# Patient Record
Sex: Male | Born: 1965 | Race: White | Hispanic: No | Marital: Single | Smoking: Former smoker
Health system: Southern US, Community
[De-identification: ages and names within clinical notes are randomized; demographics above are authoritative.]

## PROBLEM LIST (undated history)

## (undated) DIAGNOSIS — I509 Heart failure, unspecified: Secondary | ICD-10-CM

## (undated) DIAGNOSIS — I739 Peripheral vascular disease, unspecified: Secondary | ICD-10-CM

## (undated) DIAGNOSIS — Z89619 Acquired absence of unspecified leg above knee: Secondary | ICD-10-CM

## (undated) DIAGNOSIS — N184 Chronic kidney disease, stage 4 (severe): Secondary | ICD-10-CM

## (undated) DIAGNOSIS — I429 Cardiomyopathy, unspecified: Secondary | ICD-10-CM

## (undated) DIAGNOSIS — I2510 Atherosclerotic heart disease of native coronary artery without angina pectoris: Secondary | ICD-10-CM

## (undated) DIAGNOSIS — K219 Gastro-esophageal reflux disease without esophagitis: Secondary | ICD-10-CM

## (undated) DIAGNOSIS — Z951 Presence of aortocoronary bypass graft: Secondary | ICD-10-CM

## (undated) DIAGNOSIS — I10 Essential (primary) hypertension: Secondary | ICD-10-CM

## (undated) HISTORY — DX: Cardiomyopathy, unspecified: I42.9

## (undated) HISTORY — DX: Chronic kidney disease, stage 4 (severe): N18.4

## (undated) HISTORY — DX: Peripheral vascular disease, unspecified: I73.9

## (undated) HISTORY — DX: Presence of aortocoronary bypass graft: Z95.1

---

## 2005-11-14 ENCOUNTER — Emergency Department (HOSPITAL_COMMUNITY): Admission: EM | Admit: 2005-11-14 | Discharge: 2005-11-14 | Payer: Self-pay | Admitting: Emergency Medicine

## 2005-11-16 ENCOUNTER — Encounter: Payer: Self-pay | Admitting: Emergency Medicine

## 2005-11-16 ENCOUNTER — Inpatient Hospital Stay (HOSPITAL_COMMUNITY): Admission: EM | Admit: 2005-11-16 | Discharge: 2005-11-22 | Payer: Self-pay | Admitting: Internal Medicine

## 2005-11-19 ENCOUNTER — Ambulatory Visit: Payer: Self-pay | Admitting: Infectious Diseases

## 2008-05-04 ENCOUNTER — Inpatient Hospital Stay (HOSPITAL_COMMUNITY): Admission: EM | Admit: 2008-05-04 | Discharge: 2008-05-09 | Payer: Self-pay | Admitting: Emergency Medicine

## 2008-05-04 ENCOUNTER — Ambulatory Visit: Payer: Self-pay | Admitting: Internal Medicine

## 2008-05-05 ENCOUNTER — Encounter: Payer: Self-pay | Admitting: Internal Medicine

## 2008-05-07 HISTORY — PX: CARDIAC CATHETERIZATION: SHX172

## 2008-05-08 ENCOUNTER — Ambulatory Visit: Payer: Self-pay | Admitting: Thoracic Surgery (Cardiothoracic Vascular Surgery)

## 2008-05-08 ENCOUNTER — Encounter: Payer: Self-pay | Admitting: Internal Medicine

## 2008-05-09 ENCOUNTER — Ambulatory Visit: Payer: Self-pay | Admitting: Vascular Surgery

## 2008-05-09 ENCOUNTER — Encounter (INDEPENDENT_AMBULATORY_CARE_PROVIDER_SITE_OTHER): Payer: Self-pay | Admitting: Internal Medicine

## 2008-05-11 ENCOUNTER — Encounter (INDEPENDENT_AMBULATORY_CARE_PROVIDER_SITE_OTHER): Payer: Self-pay | Admitting: Internal Medicine

## 2008-05-12 ENCOUNTER — Encounter: Payer: Self-pay | Admitting: *Deleted

## 2008-05-12 LAB — CONVERTED CEMR LAB: LDL Cholesterol: 153 mg/dL

## 2008-05-19 ENCOUNTER — Ambulatory Visit: Payer: Self-pay | Admitting: Thoracic Surgery (Cardiothoracic Vascular Surgery)

## 2008-06-03 ENCOUNTER — Encounter (INDEPENDENT_AMBULATORY_CARE_PROVIDER_SITE_OTHER): Payer: Self-pay | Admitting: *Deleted

## 2008-06-16 ENCOUNTER — Ambulatory Visit: Payer: Self-pay | Admitting: Thoracic Surgery (Cardiothoracic Vascular Surgery)

## 2008-06-23 HISTORY — PX: CORONARY ARTERY BYPASS GRAFT: SHX141

## 2008-07-07 ENCOUNTER — Ambulatory Visit: Payer: Self-pay | Admitting: Thoracic Surgery (Cardiothoracic Vascular Surgery)

## 2008-07-17 ENCOUNTER — Ambulatory Visit: Payer: Self-pay | Admitting: Thoracic Surgery (Cardiothoracic Vascular Surgery)

## 2008-07-17 ENCOUNTER — Inpatient Hospital Stay (HOSPITAL_COMMUNITY)
Admission: RE | Admit: 2008-07-17 | Discharge: 2008-07-21 | Payer: Self-pay | Admitting: Thoracic Surgery (Cardiothoracic Vascular Surgery)

## 2008-07-24 ENCOUNTER — Ambulatory Visit: Payer: Self-pay | Admitting: Thoracic Surgery (Cardiothoracic Vascular Surgery)

## 2008-08-04 ENCOUNTER — Ambulatory Visit: Payer: Self-pay | Admitting: Thoracic Surgery (Cardiothoracic Vascular Surgery)

## 2008-08-11 ENCOUNTER — Ambulatory Visit: Payer: Self-pay | Admitting: Thoracic Surgery (Cardiothoracic Vascular Surgery)

## 2008-08-11 ENCOUNTER — Encounter
Admission: RE | Admit: 2008-08-11 | Discharge: 2008-08-11 | Payer: Self-pay | Admitting: Thoracic Surgery (Cardiothoracic Vascular Surgery)

## 2008-09-08 ENCOUNTER — Ambulatory Visit: Payer: Self-pay | Admitting: Thoracic Surgery (Cardiothoracic Vascular Surgery)

## 2010-08-05 LAB — GLUCOSE, CAPILLARY
Glucose-Capillary: 100 mg/dL — ABNORMAL HIGH (ref 70–99)
Glucose-Capillary: 102 mg/dL — ABNORMAL HIGH (ref 70–99)
Glucose-Capillary: 106 mg/dL — ABNORMAL HIGH (ref 70–99)
Glucose-Capillary: 112 mg/dL — ABNORMAL HIGH (ref 70–99)
Glucose-Capillary: 122 mg/dL — ABNORMAL HIGH (ref 70–99)
Glucose-Capillary: 129 mg/dL — ABNORMAL HIGH (ref 70–99)
Glucose-Capillary: 130 mg/dL — ABNORMAL HIGH (ref 70–99)
Glucose-Capillary: 142 mg/dL — ABNORMAL HIGH (ref 70–99)
Glucose-Capillary: 161 mg/dL — ABNORMAL HIGH (ref 70–99)
Glucose-Capillary: 167 mg/dL — ABNORMAL HIGH (ref 70–99)
Glucose-Capillary: 177 mg/dL — ABNORMAL HIGH (ref 70–99)
Glucose-Capillary: 92 mg/dL (ref 70–99)

## 2010-08-05 LAB — CBC
HCT: 23.4 % — ABNORMAL LOW (ref 39.0–52.0)
HCT: 24.8 % — ABNORMAL LOW (ref 39.0–52.0)
HCT: 26.1 % — ABNORMAL LOW (ref 39.0–52.0)
HCT: 29.1 % — ABNORMAL LOW (ref 39.0–52.0)
HCT: 35.9 % — ABNORMAL LOW (ref 39.0–52.0)
Hemoglobin: 12.4 g/dL — ABNORMAL LOW (ref 13.0–17.0)
Hemoglobin: 8.3 g/dL — ABNORMAL LOW (ref 13.0–17.0)
Hemoglobin: 9.1 g/dL — ABNORMAL LOW (ref 13.0–17.0)
Hemoglobin: 9.4 g/dL — ABNORMAL LOW (ref 13.0–17.0)
MCHC: 34.7 g/dL (ref 30.0–36.0)
MCHC: 34.9 g/dL (ref 30.0–36.0)
MCHC: 35.2 g/dL (ref 30.0–36.0)
MCHC: 35.7 g/dL (ref 30.0–36.0)
MCV: 87.1 fL (ref 78.0–100.0)
MCV: 87.3 fL (ref 78.0–100.0)
MCV: 87.6 fL (ref 78.0–100.0)
MCV: 87.6 fL (ref 78.0–100.0)
MCV: 88.5 fL (ref 78.0–100.0)
Platelets: 103 10*3/uL — ABNORMAL LOW (ref 150–400)
Platelets: 117 10*3/uL — ABNORMAL LOW (ref 150–400)
Platelets: 87 10*3/uL — ABNORMAL LOW (ref 150–400)
Platelets: 89 10*3/uL — ABNORMAL LOW (ref 150–400)
Platelets: 97 10*3/uL — ABNORMAL LOW (ref 150–400)
RBC: 2.71 MIL/uL — ABNORMAL LOW (ref 4.22–5.81)
RBC: 3.07 MIL/uL — ABNORMAL LOW (ref 4.22–5.81)
RBC: 3.34 MIL/uL — ABNORMAL LOW (ref 4.22–5.81)
RBC: 4.1 MIL/uL — ABNORMAL LOW (ref 4.22–5.81)
RDW: 13.8 % (ref 11.5–15.5)
RDW: 13.8 % (ref 11.5–15.5)
RDW: 13.8 % (ref 11.5–15.5)
RDW: 13.9 % (ref 11.5–15.5)
RDW: 14 % (ref 11.5–15.5)
WBC: 6.1 10*3/uL (ref 4.0–10.5)
WBC: 9.3 10*3/uL (ref 4.0–10.5)
WBC: 9.8 10*3/uL (ref 4.0–10.5)

## 2010-08-05 LAB — MAGNESIUM
Magnesium: 2.7 mg/dL — ABNORMAL HIGH (ref 1.5–2.5)
Magnesium: 2.7 mg/dL — ABNORMAL HIGH (ref 1.5–2.5)
Magnesium: 2.8 mg/dL — ABNORMAL HIGH (ref 1.5–2.5)

## 2010-08-05 LAB — POCT I-STAT, CHEM 8
BUN: 33 mg/dL — ABNORMAL HIGH (ref 6–23)
Chloride: 101 mEq/L (ref 96–112)
Chloride: 105 mEq/L (ref 96–112)
Glucose, Bld: 137 mg/dL — ABNORMAL HIGH (ref 70–99)
HCT: 27 % — ABNORMAL LOW (ref 39.0–52.0)
HCT: 27 % — ABNORMAL LOW (ref 39.0–52.0)
Hemoglobin: 9.2 g/dL — ABNORMAL LOW (ref 13.0–17.0)
Potassium: 4.3 mEq/L (ref 3.5–5.1)
Potassium: 4.4 mEq/L (ref 3.5–5.1)
Sodium: 139 mEq/L (ref 135–145)

## 2010-08-05 LAB — BASIC METABOLIC PANEL
BUN: 24 mg/dL — ABNORMAL HIGH (ref 6–23)
BUN: 42 mg/dL — ABNORMAL HIGH (ref 6–23)
CO2: 27 mEq/L (ref 19–32)
Calcium: 8.8 mg/dL (ref 8.4–10.5)
Calcium: 8.8 mg/dL (ref 8.4–10.5)
Calcium: 8.8 mg/dL (ref 8.4–10.5)
Creatinine, Ser: 1.89 mg/dL — ABNORMAL HIGH (ref 0.4–1.5)
Creatinine, Ser: 2.17 mg/dL — ABNORMAL HIGH (ref 0.4–1.5)
GFR calc Af Amer: 37 mL/min — ABNORMAL LOW (ref >60)
GFR calc non Af Amer: 30 mL/min — ABNORMAL LOW (ref >60)
GFR calc non Af Amer: 35 mL/min — ABNORMAL LOW (ref >60)
GFR calc non Af Amer: 39 mL/min — ABNORMAL LOW (ref >60)
Glucose, Bld: 135 mg/dL — ABNORMAL HIGH (ref 70–99)
Glucose, Bld: 144 mg/dL — ABNORMAL HIGH (ref 70–99)
Glucose, Bld: 152 mg/dL — ABNORMAL HIGH (ref 70–99)
Potassium: 4.2 mEq/L (ref 3.5–5.1)
Sodium: 135 mEq/L (ref 135–145)

## 2010-08-05 LAB — TYPE AND SCREEN
ABO/RH(D): O POS
Antibody Screen: NEGATIVE

## 2010-08-05 LAB — POCT I-STAT 4, (NA,K, GLUC, HGB,HCT)
Glucose, Bld: 137 mg/dL — ABNORMAL HIGH (ref 70–99)
Glucose, Bld: 160 mg/dL — ABNORMAL HIGH (ref 70–99)
Glucose, Bld: 169 mg/dL — ABNORMAL HIGH (ref 70–99)
HCT: 20 % — ABNORMAL LOW (ref 39.0–52.0)
HCT: 31 % — ABNORMAL LOW (ref 39.0–52.0)
Hemoglobin: 10.5 g/dL — ABNORMAL LOW (ref 13.0–17.0)
Hemoglobin: 10.5 g/dL — ABNORMAL LOW (ref 13.0–17.0)
Hemoglobin: 7.8 g/dL — CL (ref 13.0–17.0)
Hemoglobin: 8.5 g/dL — ABNORMAL LOW (ref 13.0–17.0)
Potassium: 3.6 mEq/L (ref 3.5–5.1)
Potassium: 4.2 mEq/L (ref 3.5–5.1)
Potassium: 4.5 mEq/L (ref 3.5–5.1)
Sodium: 137 mEq/L (ref 135–145)
Sodium: 138 mEq/L (ref 135–145)

## 2010-08-05 LAB — BLOOD GAS, ARTERIAL
Acid-Base Excess: 1.3 mmol/L (ref 0.0–2.0)
Bicarbonate: 25.3 mEq/L — ABNORMAL HIGH (ref 20.0–24.0)
Drawn by: 206361
FIO2: 0.21 %
O2 Saturation: 95.6 %
pH, Arterial: 7.417 (ref 7.350–7.450)

## 2010-08-05 LAB — PREPARE PLATELETS

## 2010-08-05 LAB — COMPREHENSIVE METABOLIC PANEL
AST: 30 U/L (ref 0–37)
BUN: 31 mg/dL — ABNORMAL HIGH (ref 6–23)
Chloride: 106 mEq/L (ref 96–112)
Creatinine, Ser: 1.63 mg/dL — ABNORMAL HIGH (ref 0.4–1.5)
GFR calc Af Amer: 56 mL/min — ABNORMAL LOW (ref >60)
GFR calc non Af Amer: 46 mL/min — ABNORMAL LOW (ref >60)
Glucose, Bld: 206 mg/dL — ABNORMAL HIGH (ref 70–99)
Potassium: 4.1 mEq/L (ref 3.5–5.1)
Sodium: 142 mEq/L (ref 135–145)
Total Bilirubin: 0.7 mg/dL (ref 0.3–1.2)
Total Protein: 6.4 g/dL (ref 6.0–8.3)

## 2010-08-05 LAB — CREATININE, SERUM
GFR calc Af Amer: 56 mL/min — ABNORMAL LOW (ref >60)
GFR calc non Af Amer: 32 mL/min — ABNORMAL LOW (ref >60)

## 2010-08-05 LAB — POCT I-STAT 3, ART BLOOD GAS (G3+)
Acid-Base Excess: 1 mmol/L (ref 0.0–2.0)
Bicarbonate: 23.6 mEq/L (ref 20.0–24.0)
Bicarbonate: 25.6 mEq/L — ABNORMAL HIGH (ref 20.0–24.0)
Bicarbonate: 25.7 mEq/L — ABNORMAL HIGH (ref 20.0–24.0)
O2 Saturation: 99 %
Patient temperature: 37.7
TCO2: 25 mmol/L (ref 0–100)
TCO2: 27 mmol/L (ref 0–100)
TCO2: 27 mmol/L (ref 0–100)
pCO2 arterial: 39.5 mmHg (ref 35.0–45.0)
pH, Arterial: 7.409 (ref 7.350–7.450)
pO2, Arterial: 83 mmHg (ref 80.0–100.0)

## 2010-08-05 LAB — PREPARE FRESH FROZEN PLASMA

## 2010-08-05 LAB — URINALYSIS, ROUTINE W REFLEX MICROSCOPIC
Ketones, ur: NEGATIVE mg/dL
Nitrite: NEGATIVE
Protein, ur: NEGATIVE mg/dL

## 2010-08-05 LAB — HEMOGLOBIN A1C: Hgb A1c MFr Bld: 7.3 % — ABNORMAL HIGH (ref 4.6–6.1)

## 2010-08-05 LAB — PROTIME-INR
INR: 0.9 (ref 0.00–1.49)
INR: 1.3 (ref 0.00–1.49)
Prothrombin Time: 16.3 seconds — ABNORMAL HIGH (ref 11.6–15.2)

## 2010-08-05 LAB — HEMOGLOBIN AND HEMATOCRIT, BLOOD: Hemoglobin: 8.1 g/dL — ABNORMAL LOW (ref 13.0–17.0)

## 2010-08-05 LAB — ABO/RH: ABO/RH(D): O POS

## 2010-08-09 LAB — GLUCOSE, CAPILLARY
Glucose-Capillary: 123 mg/dL — ABNORMAL HIGH (ref 70–99)
Glucose-Capillary: 139 mg/dL — ABNORMAL HIGH (ref 70–99)
Glucose-Capillary: 157 mg/dL — ABNORMAL HIGH (ref 70–99)
Glucose-Capillary: 160 mg/dL — ABNORMAL HIGH (ref 70–99)
Glucose-Capillary: 161 mg/dL — ABNORMAL HIGH (ref 70–99)
Glucose-Capillary: 166 mg/dL — ABNORMAL HIGH (ref 70–99)
Glucose-Capillary: 168 mg/dL — ABNORMAL HIGH (ref 70–99)
Glucose-Capillary: 185 mg/dL — ABNORMAL HIGH (ref 70–99)
Glucose-Capillary: 189 mg/dL — ABNORMAL HIGH (ref 70–99)
Glucose-Capillary: 203 mg/dL — ABNORMAL HIGH (ref 70–99)
Glucose-Capillary: 276 mg/dL — ABNORMAL HIGH (ref 70–99)

## 2010-08-09 LAB — RAPID URINE DRUG SCREEN, HOSP PERFORMED
Barbiturates: NOT DETECTED
Cocaine: NOT DETECTED
Opiates: NOT DETECTED
Tetrahydrocannabinol: NOT DETECTED

## 2010-08-09 LAB — POCT CARDIAC MARKERS: Myoglobin, poc: 99.6 ng/mL (ref 12–200)

## 2010-08-09 LAB — BASIC METABOLIC PANEL
BUN: 23 mg/dL (ref 6–23)
CO2: 28 mEq/L (ref 19–32)
CO2: 32 mEq/L (ref 19–32)
Calcium: 8.3 mg/dL — ABNORMAL LOW (ref 8.4–10.5)
Calcium: 8.8 mg/dL (ref 8.4–10.5)
Chloride: 101 mEq/L (ref 96–112)
Creatinine, Ser: 1.56 mg/dL — ABNORMAL HIGH (ref 0.4–1.5)
GFR calc Af Amer: 53 mL/min — ABNORMAL LOW (ref >60)
GFR calc Af Amer: 56 mL/min — ABNORMAL LOW (ref >60)
GFR calc Af Amer: 59 mL/min — ABNORMAL LOW (ref >60)
GFR calc non Af Amer: 42 mL/min — ABNORMAL LOW (ref >60)
GFR calc non Af Amer: 44 mL/min — ABNORMAL LOW (ref >60)
GFR calc non Af Amer: 46 mL/min — ABNORMAL LOW (ref >60)
Glucose, Bld: 176 mg/dL — ABNORMAL HIGH (ref 70–99)
Potassium: 3.6 mEq/L (ref 3.5–5.1)
Potassium: 3.8 mEq/L (ref 3.5–5.1)
Potassium: 3.9 mEq/L (ref 3.5–5.1)
Potassium: 3.9 mEq/L (ref 3.5–5.1)
Sodium: 136 mEq/L (ref 135–145)
Sodium: 139 mEq/L (ref 135–145)

## 2010-08-09 LAB — CBC
HCT: 36.1 % — ABNORMAL LOW (ref 39.0–52.0)
HCT: 37.5 % — ABNORMAL LOW (ref 39.0–52.0)
HCT: 37.7 % — ABNORMAL LOW (ref 39.0–52.0)
HCT: 38.6 % — ABNORMAL LOW (ref 39.0–52.0)
HCT: 40 % (ref 39.0–52.0)
HCT: 41.7 % (ref 39.0–52.0)
Hemoglobin: 12.1 g/dL — ABNORMAL LOW (ref 13.0–17.0)
Hemoglobin: 12.5 g/dL — ABNORMAL LOW (ref 13.0–17.0)
Hemoglobin: 12.7 g/dL — ABNORMAL LOW (ref 13.0–17.0)
Hemoglobin: 12.7 g/dL — ABNORMAL LOW (ref 13.0–17.0)
Hemoglobin: 13 g/dL (ref 13.0–17.0)
Hemoglobin: 13.7 g/dL (ref 13.0–17.0)
MCHC: 32.6 g/dL (ref 30.0–36.0)
MCHC: 33.7 g/dL (ref 30.0–36.0)
MCV: 89.6 fL (ref 78.0–100.0)
MCV: 91.7 fL (ref 78.0–100.0)
Platelets: 91 10*3/uL — ABNORMAL LOW (ref 150–400)
RBC: 4.03 MIL/uL — ABNORMAL LOW (ref 4.22–5.81)
RBC: 4.17 MIL/uL — ABNORMAL LOW (ref 4.22–5.81)
RBC: 4.21 MIL/uL — ABNORMAL LOW (ref 4.22–5.81)
RBC: 4.6 MIL/uL (ref 4.22–5.81)
RDW: 13.4 % (ref 11.5–15.5)
RDW: 13.6 % (ref 11.5–15.5)
RDW: 13.6 % (ref 11.5–15.5)
WBC: 5 10*3/uL (ref 4.0–10.5)
WBC: 6 10*3/uL (ref 4.0–10.5)
WBC: 6.5 10*3/uL (ref 4.0–10.5)
WBC: 8.1 10*3/uL (ref 4.0–10.5)

## 2010-08-09 LAB — POCT I-STAT 3, VENOUS BLOOD GAS (G3P V)
Bicarbonate: 23.9 mEq/L (ref 20.0–24.0)
O2 Saturation: 97 %
TCO2: 24 mmol/L (ref 0–100)
TCO2: 25 mmol/L (ref 0–100)
pCO2, Ven: 34.6 mmHg — ABNORMAL LOW (ref 45.0–50.0)
pCO2, Ven: 39.8 mmHg — ABNORMAL LOW (ref 45.0–50.0)
pH, Ven: 7.387 — ABNORMAL HIGH (ref 7.250–7.300)
pH, Ven: 7.429 — ABNORMAL HIGH (ref 7.250–7.300)

## 2010-08-09 LAB — COMPREHENSIVE METABOLIC PANEL
ALT: 21 U/L (ref 0–53)
Alkaline Phosphatase: 102 U/L (ref 39–117)
Alkaline Phosphatase: 106 U/L (ref 39–117)
BUN: 18 mg/dL (ref 6–23)
BUN: 19 mg/dL (ref 6–23)
CO2: 27 mEq/L (ref 19–32)
Chloride: 102 mEq/L (ref 96–112)
Chloride: 104 mEq/L (ref 96–112)
Glucose, Bld: 186 mg/dL — ABNORMAL HIGH (ref 70–99)
Glucose, Bld: 337 mg/dL — ABNORMAL HIGH (ref 70–99)
Potassium: 3.9 mEq/L (ref 3.5–5.1)
Potassium: 4.1 mEq/L (ref 3.5–5.1)
Sodium: 142 mEq/L (ref 135–145)
Total Bilirubin: 1.2 mg/dL (ref 0.3–1.2)
Total Bilirubin: 1.5 mg/dL — ABNORMAL HIGH (ref 0.3–1.2)
Total Protein: 5 g/dL — ABNORMAL LOW (ref 6.0–8.3)

## 2010-08-09 LAB — HEPATITIS PANEL, ACUTE: Hep A IgM: NEGATIVE

## 2010-08-09 LAB — PLATELET COUNT: Platelets: 92 10*3/uL — ABNORMAL LOW (ref 150–400)

## 2010-08-09 LAB — HEMOGLOBIN A1C
Hgb A1c MFr Bld: 8.3 % — ABNORMAL HIGH (ref 4.6–6.1)
Hgb A1c MFr Bld: 8.4 % — ABNORMAL HIGH (ref 4.6–6.1)
Mean Plasma Glucose: 192 mg/dL

## 2010-08-09 LAB — DIFFERENTIAL
Basophils Absolute: 0 10*3/uL (ref 0.0–0.1)
Eosinophils Absolute: 0.2 10*3/uL (ref 0.0–0.7)
Lymphocytes Relative: 12 % (ref 12–46)
Monocytes Absolute: 0.5 10*3/uL (ref 0.1–1.0)
Neutrophils Relative %: 80 % — ABNORMAL HIGH (ref 43–77)

## 2010-08-09 LAB — PROTIME-INR: INR: 1 (ref 0.00–1.49)

## 2010-08-09 LAB — URINALYSIS, ROUTINE W REFLEX MICROSCOPIC
Bilirubin Urine: NEGATIVE
Glucose, UA: NEGATIVE mg/dL
Ketones, ur: NEGATIVE mg/dL
Leukocytes, UA: NEGATIVE
Specific Gravity, Urine: 1.006 (ref 1.005–1.030)
pH: 7 (ref 5.0–8.0)

## 2010-08-09 LAB — LIPID PANEL
HDL: 36 mg/dL — ABNORMAL LOW (ref >39)
Triglycerides: 109 mg/dL (ref <150)
VLDL: 22 mg/dL (ref 0–40)

## 2010-08-09 LAB — HIV ANTIBODY (ROUTINE TESTING W REFLEX): HIV: NONREACTIVE

## 2010-08-09 LAB — APTT: aPTT: 34 seconds (ref 24–37)

## 2010-08-09 LAB — BRAIN NATRIURETIC PEPTIDE: Pro B Natriuretic peptide (BNP): 1105 pg/mL — ABNORMAL HIGH (ref 0.0–100.0)

## 2010-09-07 NOTE — Assessment & Plan Note (Signed)
OFFICE VISIT   AUGUSTA, HILBERT  DOB:  31-Jan-1966                                        August 04, 2008  CHART #:  [id number]   The patient comes in today at the request of [facility] for evaluation of his left leg EVH site.  He is status post  coronary artery bypass grafting x3 by Dr. [name] on July 17, 2008.  His  postoperative course was uneventful and he was discharged home on July 21, 2008, in good condition.  Over the past several days, he has noted  increasing swelling around his left lower extremity EVH incision.  The  area has become tender to touch and the incision site itself has become  erythematous.  He was in for a visit with Dr. [name] today and  apparently had a lower extremity ultrasound, which showed fluid  collection around the knee at the [facility] site on the left.  I do not have  her report from the study.  However, they referred the patient to our  office for further evaluation and possible treatment.  He is otherwise  doing very well in his recovery.  He is walking several times a day and  is making good progress.  He is still not resting well, but this is  improving.  He is still having some pain and discomfort in his chest  mostly at night and is taking his pain medication primarily at night.  He is eating well.  He was given a good checkup today at Dr. [name]  office.  None of his medications were changed and he is scheduled to  follow up with him again in June 2010.  He has been contacted by the  cardiac rehab department in [place].  He has a set up for his first  informational session in the next week.   PHYSICAL EXAMINATION:  VITAL SIGNS:  Blood pressure 161/82, pulse is 70  and regular, respirations 18 and unlabored, and O2 sat 98% on room air.  CHEST:  His sternal and chest tube incisions have healed well.  Sternum  is stable.  HEART:  Regular rate and rhythm without murmurs, rubs, or gallops.  LUNGS:   Clear.  EXTREMITIES:  His left lower extremity showed some mild edema.  There is  resolving ecchymosis of the thigh.  There is an area around the  posteromedial knee, which is near to the [facility] site, which is very tender  to touch and soft.  The [facility] site itself is mildly erythematous.   ASSESSMENT AND PLAN:  Overall, the patient is doing well, status post  coronary artery bypass graft.  He does have a mild cellulitis of his  endovein harvest site and looks to be resolving hematoma or fluid  collection in his leg.  Since he is diabetic, I have placed him on  Keflex 500 mg t.i.d. for the next week.  He has a routine followup  scheduled with Dr. [name] next Monday with a chest x-ray.  I have also  given him a refill on oxycodone 5 mg #40.  I have asked him to call in  the interim if he experiences any further problems or has questions.   Salvatore Decent. [name], M.D.  Electronically Signed   GC/MEDQ  D:  08/04/2008  T:  08/05/2008  Job:  [id number]   cc:   [name], MD

## 2010-09-07 NOTE — Discharge Summary (Signed)
NAMERUDIE, SERMONS NO.:  [id number]   MEDICAL RECORD NO.:  [id number]          PATIENT TYPE:  INP   LOCATION:  2016                         FACILITY:  [facility]   PHYSICIAN:  Salvatore Decent. [name], M.D. DATE OF BIRTH:  [birth year]   DATE OF ADMISSION:  07/17/2008  DATE OF DISCHARGE:  07/21/2008                               DISCHARGE SUMMARY   PRIMARY ADMITTING DIAGNOSIS:  Coronary artery disease.   ADDITIONAL/DISCHARGE DIAGNOSES:  1. Severe three-vessel coronary artery disease.  2. Ischemic cardiomyopathy with chronic systolic and diastolic      congestive heart failure.  3. Ischemic mitral regurgitation.  4. Hypertension.  5. Type 2 diabetes mellitus.  6. Hyperlipidemia.  7. Long-standing history of tobacco abuse.  8. Remote alcohol abuse.  9. Remote Polysubstance abuse.  10.Chronic renal insufficiency with baseline creatinine 1.7.  11.Questionable history of psychiatric illness.  12.History of methicillin-resistant Staphylococcus aureus of the left      hand in 2007.  13.Postoperative acute blood loss anemia.  14.Thrombocytopenia.   PROCEDURES PERFORMED:  1. Coronary artery bypass grafting x3 (left internal mammary artery to      the LAD, saphenous vein graft to the obtuse marginal, saphenous      vein graft to the PDA).  2. Endoscopic vein harvest left leg.   HISTORY:  The patient is a 45 year old male who was recently referred to  Dr. [name] for consideration of surgical revascularization.  He was  hospitalized in January 2010 with chronic systolic and diastolic  congestive heart failure.  He had previously discontinued all of his  medications due to financial concerns and had not had any followup with  the physician.  He underwent an echocardiogram in the hospital which  showed severe left ventricular dysfunction with ejection fraction  estimated at 10% and a dilated cardiomyopathy.  There was mild-to-  moderate MR.  He subsequently underwent left and  right heart  catheterization and was found to have severe three-vessel coronary  artery disease with moderate-to-severe pulmonary hypertension.  An MRI  viability study was performed which showed diffuse scarring to the left  ventricle consistent with severe ischemic cardiomyopathy.  There were  areas of viability present.  He was treated medically and his symptoms  did improve.  He was initially seen in the hospital as a consultation by  Dr. [name] and following his discharge he returned to the office  for outpatient followup.  At the time of his return to the office, he  complained of symptoms which sound suspicious for angina.  Dr. [name] felt  that the best course of action would be to proceed with coronary artery  bypass surgery at this time.  He explained all risks, benefits and  alternatives to the patient and he agreed to proceed.   HOSPITAL COURSE:  Mr. Toothman was admitted to [facility] on July 17, 2008 and underwent CABG x3 as described above.  Please see  previously dictated operative report for complete details of surgery.  He tolerated the procedure well and was transferred to the SICU in  stable condition.  He was extubated shortly after surgery and was  hemodynamically stable on postop day #1.  He received a transfusion of a  unit of packed red blood cells and a unit of platelets for anemia and  thrombocytopenia, respectively.  He initially required pressor agents  for some hypertension but these were weaned and discontinued over his  first postop day.  By postop day #2 he was off all drips and was started  on a low-dose beta blocker.  He was able to be transferred to the step-  down unit at that time.  Overall, his postoperative course has been  stable.  He did have a mild bump in his creatinine postoperatively to  2.36.  This is trending downward and on postop day #3 his BUN is 38 and  creatinine is 2.17.  His other labs have remained stable with  hemoglobin  8.3, hematocrit 23.2, platelets 79, white count 6.6.  He has been mildly  volume overloaded and was started on Lasix to which he is responding  well.  He has been restarted on his home diabetes medications and has  been tapered off Lantus and his blood sugars have remained stable.  His  hemoglobin A1c preoperatively is 7.6.  He is ambulating in the halls  without difficulty.  He is maintaining normal sinus rhythm.  He is  tolerating a regular diet and is having normal bowel and bladder  function.  He will undergo repeat labs on the morning of July 21, 2008.  We anticipate that if he remains stable, he will hopefully be ready for  discharge with the next 24-48 hours.   DISCHARGE MEDICATIONS:  1. Enteric-coated aspirin 325 mg daily.  2. Coreg 6.25 mg b.i.d.  3. Lasix 40 mg daily x 5 days.  4. Potassium 20 mEq daily x 5 days.  5. Glipizide 5 mg daily.  6. Pravastatin 40 mg daily.   DISCHARGE INSTRUCTIONS:  He was asked to refrain from driving, heavy  lifting or strenuous activity.  He may continue ambulating daily and  using his incentive spirometer.  He may shower daily and clean his  incisions with soap and water.   DISCHARGE FOLLOWUP:  He will need to make an appointment to see Dr.  [name] in the next 2 weeks.  He will then follow up with Dr. [name] in 3  weeks with a chest x-ray.  In the interim, if he experiences problems or  has questions, he is asked to contact our office.      Coral Ceo, P.A.      Salvatore Decent. [name], M.D.  Electronically Signed    GC/MEDQ  D:  07/20/2008  T:  07/21/2008  Job:  [id number]   cc:   [name], MD

## 2010-09-07 NOTE — Assessment & Plan Note (Signed)
OFFICE VISIT   DENNYS, GUIN  DOB:  [birth year]                                        May 19, 2008  CHART #:  [id number]   HISTORY OF PRESENT ILLNESS:  The patient returns for followup of severe  3-vessel coronary artery disease and ischemic cardiomyopathy.  He was  originally seen in consultation on May 08, 2008, and a full  consultation note was dictated at that time.  He did undergo MRI  viability study while he remained in the hospital.  The MRI confirmed  the presence of severe left ventricular dysfunction with calculated  ejection fraction of 24%.  The left ventricle and left atrium were  dilated.  There were areas of thinning of the left ventricular wall with  scar particularly involving the distal anterior wall and the inferior  wall.  However, there were also areas of viability, and particularly  given the patient's age, it seems likely that he might benefit from  elective surgical revascularization despite the fact that surgery will  be high risk.   The patient returns to the office today feeling fairly well.  He reports  being diligent with respect to taking his medications, and he is not  smoking.  He seems to be serious about getting his act together.  He has  not yet received a home glucose monitoring device, but he has already  made arrangements for one to be delivered.  He denies any chest pain,  chest tightness, or chest pressure either with activity or at rest.  He  denies any shortness of breath.  He reports that he feels considerably  better than he did prior to going to the hospital.  The remainder of his  review of systems is unrevealing.   CURRENT MEDICATIONS:  Aspirin 81 mg daily, Coreg 25 mg twice daily,  Lasix 40 mg daily, glipizide 5 mg daily, Imdur 30 mg daily, hydralazine  25 mg 3 times daily, and pravastatin 40 mg daily.   PHYSICAL EXAMINATION:  GENERAL:  Notable for a well-appearing male.  VITAL SIGNS:  Blood  pressure 140/81, pulse 69, and oxygen saturation 98%  on room air.  HEENT:  Unrevealing.  NECK:  Supple.  CHEST:  Auscultation of the chest demonstrates clear breath sounds,  which are symmetrical bilaterally.  No wheezes or rhonchi are noted.  CARDIOVASCULAR:  Regular rate and rhythm.  No murmurs, rubs, or gallops  are appreciated.  ABDOMEN:  Soft, nontender.  EXTREMITIES:  Warm and well perfused.  There is no lower extremity  edema.   IMPRESSION:  Severe 3-vessel coronary artery disease with ischemic  cardiomyopathy.  Although surgery will be very high risk, I do believe  that the patient would benefit from surgical revascularization.   PLAN:  I have suggested that we could proceed with surgery in the near  future.  However, the patient was to hold off for several weeks for  variety of personal reasons.  I have stressed to him the importance that  it could be disastrous if we wait until something bad happens, and he  might no longer be a candidate should he suffer another myocardial  infarction or significant complications related to ventricular  dysrhythmia.  He could be at risk for sudden death.  Nevertheless, he  appears to be doing quite well on medical therapy in the  short term.  We  will plan to see him back in 4 weeks for further followup and if he is  agreeable, we will go head and schedule surgery at that time.   Salvatore Decent. [name], M.D.  Electronically Signed   CHO/MEDQ  D:  05/19/2008  T:  05/19/2008  Job:  [id number]   cc:   [name], MD

## 2010-09-07 NOTE — Discharge Summary (Signed)
[name], [name] NO.:  [id number]   MEDICAL RECORD NO.:  [id number]          PATIENT TYPE:  INP   LOCATION:  3709                         FACILITY:  [facility]   PHYSICIAN:  [name], M.D.    DATE OF BIRTH:  Sep 15, 1965   DATE OF ADMISSION:  05/04/2008  DATE OF DISCHARGE:  05/09/2008                               DISCHARGE SUMMARY   DISCHARGE DIAGNOSES:  1. Coronary artery disease.  2. Ischemic cardiomyopathy, ejection fraction 10%.  3. Hypertension.  4. Diabetes with A1c 10.5.  5. Dyslipidemia.  6. History of methicillin-resistant Staphylococcus aureus infection of      left hand.  7. History of psychiatric illness, question schizophrenia.  8. Renal impairment.  9. History of substance abuse.  10.Liver dysfunction with hyperbilirubinemia, question Gilbert's.  11.Current tobacco abuse.   DISCHARGE MEDICATIONS:  1. Aspirin 81 mg once daily.  2. Coreg 25 mg twice daily.  3. Lasix 40 mg once daily.  4. Glipizide 5 mg once daily.  5. Imdur 30 mg once daily.  6. Hydralazine 25 mg 3 times a day.   DISPOSITION AND FOLLOWUP:  1. The patient will be seen by Dr. [name] in outpatient cardiology.      Dr. [name] office will call with an appointment.  The patient      has been given the phone number, [phone].  2. The patient will be seen by Dr. [name] with [facility], phone      number [phone].  An appointment has been set up for May 19, 2008, at 2:45 p.m., at which time the patient will be reviewed for      any [facility] intervention that he might benefit from.  3. The patient will be followed by Dr. [name] at the [facility] outpatient clinic, phone number [phone].  An      appointment has been set up for June 12, 2008, at 1:30 p.m.      The patient will be reviewed in terms of his primary care needs      especially in regards to his heart failure, diabetes, hypertension,      and hyperlipidemia as well as the renal  impairment.  A repeat BMET      is recommended at the followup visit.   PROCEDURES AND STUDIES:  1. Renal ultrasound May 06, 2008, normal-appearing kidneys with      normal bladder, no evidence of obstructive uropathy.  2. MR exam of the heart.   IMPRESSION:  1. Markedly poor left ventricular function with a calculated ejection      fraction of 24%.  He has a left ventricular wall thinning and scar      as described.  There are areas which appear vital and may benefit      from contemplative bypass.  2. A 2-D echo on May 05, 2008, left ventricle mildly dilated.      Overall, left ventricular systolic function reduced with ejection      fraction estimated to be 10%.  No left  ventricular regional wall      motion abnormalities.  Severe diffuse left ventricular hypokinesis.      Doppler parameters consisted with abnormal left ventricular      relaxation.  Right ventricular systolic function markedly reduced.      Estimated peak right ventricular systolic pressure 35.  Mild-to-      moderate pulmonic and tricuspid regurgitation.  3. Cardiac catheterization on May 07, 2008, summary of findings:      a.     Severe 3-vessel coronary artery disease.      b.     Severely depressed cardiac output and cardiac index.   CONSULTANTS:  1. Cardiology, Dr. [name] and Dr. [name].  2. Cardiovascular, Dr. [name].   BRIEF ADMISSION HISTORY AND PHYSICAL AND HISTORY OF PRESENT ILLNESS:  The patient is a 45 year old man with past medical history of diabetes  and hypertension not on treatment secondary to financial constraints,  who presented to the ED on May 04, 2008. complaining of bilateral  lower extremity swelling for 1 month duration associated with dyspnea,  particularly with exertion.  He decided to come on that day because his  son said he was worried his father was going to die and the patient  decided he needed to take better care of himself.  His  symptoms had not  worsened that date, but gradually over the previous month.  He denied  chest pain, palpitations, orthopnea, PND, but he had noted a weight  gain, although he was not sure about the amount.  He denied any  complaints including nausea, vomiting, diarrhea, headache, dizziness,  cough, or abdominal pain.   ALLERGIES:  No known allergies.   SUBSTANCE HISTORY:  Current smoker 2 pack per day for 10 years, history  of marijuana abuse, history of cocaine abuse in the past.   SOCIAL HISTORY:  The patient is divorced, works as a Curator and on  self-pay.   FAMILY HISTORY:  Diabetes in mother, one of his siblings has  schizophrenia.  He has 4 healthy kids.   ADMISSION PHYSICAL EXAMINATION:  VITAL SIGNS:  Temperature 96.9, blood  pressure 171/72, pulse 112, respiratory rate 16, oxygen saturation 99%  on room air.  GENERAL:  Alert and oriented x3 without any distress,  EYES:  Anicteric.  PERRL, EOMI.  ENT:  Poor dentition.  NECK:  No lymphadenopathy.  No JVD or thyromegaly.  CHEST:  No crackles.  Normal effort.  CARDIOVASCULAR:  Regular rate and rhythm.  No gallop.  No carotid  bruits.  ABDOMEN:  Normal bowel sounds.  Soft, nontender, nondistended.  EXTREMITIES:  Pitting edema 2+ distal feet toe nails, feet cool,  multiple callus.  No ulcers.  SKIN:  Tattoos present.  NEUROLOGIC:  Nonfocal.  PSYCHIATRIC:  Appropriate.   ADMISSION LABORATORY DATA:  Sodium 139, potassium 4.1, chloride 104,  bicarbonate 27, BUN 18, creatinine 1.6, blood glucose 337.  Point-of-  care cardiac markers, troponin less than 0.05, BNP 1105, bilirubin 1.5,  alk phos 106, AST 21, ALT 25, protein 5.5, albumin 2.9, calcium 8.9.  Hemoglobin 13.7, white cell 8.1.   ELECTROCARDIOGRAM:  Sinus tachycardia with Q-waves anteriorly.  T-wave  inversion posteriorly with prolonged QT.   CHEST X-RAY:  Cardiomegaly with pulmonary edema.   HOSPITAL COURSE:  1. Ischemic cardiomyopathy.  The patient was  admitted for workup of      new-onset heart failure.  He was started on aspirin, beta-blocker,      as well as  given diuresis with Lasix.  He was also placed on      nitrates and hydralazine combination.  He underwent a battery of      tests/studies as mentioned above based on which a diagnosis of      ischemic cardiomyopathy was established.  Acute coronary syndrome      was ruled out with cycled enzymes and electrocardiogram.  We      enlisted cardiologist as well as cardiovascular surgeons for      possible intervention.  The patient is to be followed up by his      primary care doctor as well as cardiologist for optimization of his      medications as well as possible intervention.  2. Hypertension.  Medications restarted as listed above.  The patient      responded well.  3. Diabetes.  The patient was started on glipizide.  While in the      hospital, he was also placed on sliding scale insulin.  4. Hyperlipidemia.  The patient was started on pravastatin.  5. Renal impairment.  This was most likely prerenal related to heart      failure.  The patient's creatinine was closely observed, which      remained stable.  6. Tobacco abuse.  The patient was consulted on the harms of tobacco      abuse and was advised to stop smoking.  A smoking cessation consult      was also placed.   DISCHARGE DAY VITALS:  Temperature 97.9, blood pressure systolic ranging  between 127-138 and diastolic 75-80, pulse 71-74, respiratory rate 16-  19, oxygen saturation 97-99% on room air.   DISCHARGE DAY LABORATORY DATA:  Sodium 139, potassium 2.9, chloride 106,  bicarbonate 27, glucose 175, BUN 20, creatinine 1.72, hemoglobin 12.1,  hematocrit 36.1, white cell 4.9, platelet 89,000.       Zara Council, MD  Electronically Signed      [name], M.D.  Electronically Signed    AS/MEDQ  D:  05/11/2008  T:  05/12/2008  Job:  [id number]

## 2010-09-07 NOTE — Assessment & Plan Note (Signed)
OFFICE VISIT   YOVANY, CLOCK  DOB:  Feb 19, 1966                                        August 11, 2008  CHART #:  [id number]   HISTORY OF PRESENT ILLNESS:  The patient is a 45 year old gentleman who  had coronary bypass grafting by Dr. [name] on March 25.  He was seen in  the office on April 12 for cellulitis of his vein harvest site.  He had  had an endoscopic vein.  He had some erythema and induration in his  right leg.  He states that it has improved significantly over the past  week.  Overall, he is doing well.  He has not had any anginal-type chest  pain.  He is trying to be relatively active and feels like his family is  trying to hold him back.  He states he does still have swelling in that  leg where the vein was harvested.   PHYSICAL EXAMINATION:  VITAL SIGNS:  His blood pressure is 132/76, pulse  70, respirations were 20, his oxygen saturation 100%.  LUNGS:  Clear with equal breath sounds bilaterally.  CARDIAC:  A regular rate and rhythm.  No murmur.  EXTREMITIES:  His left lower extremity has some mild edema.  There was  no edema in the right.  He has some mild pink discoloration, but no  erythema or significant tenderness.   Chest x-ray shows good aeration in the lungs bilaterally.  There is  improved cardiomegaly and resolved vascular congestion.   IMPRESSION:  The patient is doing well at this point in time.  He is now  3 weeks out from surgery.  He had a cellulitis of his vein harvest site,  which is improved per his description.  He does still have a little  swelling in that area.  He will finish his Keflex today and advised him  to finish up that prescription.  I do not think he needs anymore  antibiotics at this time but did advise him to keep an eye on that leg  and if it were to start getting more tender or more swelling or more  redness, to let us know immediately.  He does still have some edema in  that leg.  He has some Lasix at home.   I advised him to go back on that  for a week.  He did advise him that if he were to experience the signs  of dehydration such as dizziness or lightheadedness when he goes from a  lying to sitting or sitting to standing positions to back off of that.   I did give him prescription for some additional oxycodone 7.5/325, 40  tablets.  He still has a few from his visit last week.  We will  tentatively schedule him for a followup appointment with Dr. [name].  He  returns from [facility].  He is instructed to call back if he has any  other questions in the meantime.   Salvatore Decent Dorris Fetch, M.D.  Electronically Signed   SCH/MEDQ  D:  08/11/2008  T:  08/12/2008  Job:  [id number]

## 2010-09-07 NOTE — Consult Note (Signed)
[name], [name]                 ACCOUNT NO.:  [id number]   MEDICAL RECORD NO.:  [id number]          PATIENT TYPE:  INP   LOCATION:  3709                         FACILITY:  [facility]   PHYSICIAN:  Salvatore Decent. [name], M.D. 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   HISTORY OF PRESENT ILLNESS:  [name] is a 45 year old white male from  [place], [place] with no previous cardiac history, but risk  factors notable for history of hypertension, type 2 diabetes mellitus,  hyperlipidemia, and longstanding tobacco abuse.  The patient also has a  family history of coronary artery disease.  The patient stopped taking  medications several years ago due to financial concerns and has not seen  a physician in quite some time.  He reports that over the last month or  so, he developed progressive symptoms of exertional shortness of breath  with bilateral lower extremity edema.  His 13 year old son noticed that  he was having increasing problems and raise concerns at school that he  thought his father was not going to live much longer.  This was brought  to [name] attention, prompting him to present to the hospital for  further management.  He has never had any chest pain, chest tightness,  or chest pressure either with activity or at rest.  He presented to the  hospital on May 04, 2008 and was noted to have newly diagnosed  congestive heart failure, both systolic and diastolic, and chronic.  The  patient was admitted to the hospital and treated medically with  diuresis.  His symptoms have improved.  Baseline transthoracic  echocardiogram was performed demonstrating profound left ventricular  dysfunction with ejection fraction estimated 10% and  mild-to-moderate  mitral regurgitation.  Cardiology consultation was obtained.  The  patient subsequently underwent elective left and right heart  catheterization yesterday.  The patient was found to have severe three-  vessel coronary artery disease with moderate-to-severe pulmonary  hypertension.  Because of baseline mild chronic renal insufficiency,  left ventriculogram was not performed to decrease diode load.  MRI  viability study has been ordered and a cardiac surgical consultation was  requested to consider high-risk surgical revascularization.   REVIEW OF SYSTEMS:  GENERAL:  The patient reports normal appetite.  He  states that he weighed approximately 195 pounds for most of his life,  but he recently put on 15 pounds of fluid weight.  He has already lost 3  kg in weight since hospital admission with diuresis.  He admits to  exertional fatigue.  CARDIAC:  The patient denies any history of chest  pain, chest tightness, chest pressure either with activity or rest.  He  describes a 34-month history of progressive symptoms of exertional  shortness of breath and bilateral lower extremity edema.  He denies any  resting shortness of breath, PND, orthopnea, syncope, or palpitations.  RESPIRATORY:  Notable for exertional shortness of breath.  The patient  also has productive cough and has been productive of brownish sputum  consistent with chronic bronchitis.  GASTROINTESTINAL:  Negative.  The  patient has no difficulty swallowing.  He reports no hematochezia,  hematemesis, or melena.  His bowel function is regular.  MUSCULOSKELETAL:  Negative.  GENITOURINARY:  Negative.  HEENT:  Notable  for poor dentition.  ENDOCRINE:  Notable for type 2 diabetes mellitus  for nearly 20 years.  The patient has not been checking blood sugars for  quite some time.  Hemoglobin A1c was measured 8.4 this admission.   PAST MEDICAL HISTORY:  1. Chronic systolic and diastolic congestive heart failure, newly       diagnosed.  2. Three-vessel coronary artery disease with ischemic cardiomyopathy,      newly diagnosed.  3. Hypertension.  4. Type 2 diabetes mellitus, hemoglobin A1c 8.4.  5. Hyperlipidemia.  6. Longstanding tobacco abuse.  7. Remote history of alcohol abuse.  8. Remote history of polysubstance abuse including cocaine and      marijuana.  9. Questionable history of psychiatric illness.  10.Chronic renal insufficiency, baseline creatinine 1.7.   PAST SURGICAL HISTORY:  Left hand surgery for MRSA infection in 2007.   SOCIAL HISTORY:  The patient is divorced and lives with his 75 year old  son.  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  He is currently  smokes less than 1-1/2-pack of cigarettes per day and quit just prior to  Christmas holiday.  He has a history of 2-pack per day smoking in the  distant past.  He has a history of heavy alcohol abuse in the distant  past, but none for the last 10 years.  He has a remote history of  marijuana and cocaine use.  He denies any IV drug abuse.   Family history is notable for mother with diabetes and heart disease.   MEDICATIONS PRIOR TO ADMISSION:  None.   DRUG ALLERGIES:  None known.   PHYSICAL EXAMINATION:  GENERAL:  The patient is a somewhat poorly kept-  appearing male who appears somewhat older than his stated age, but is in  no acute distress.  He is currently afebrile.  HEENT:  Notable for poor dentition.  NECK:  Supple.  There is no palpable lymphadenopathy.  There are no  carotid bruits.  CHEST:  Auscultation of the chest reveals clear breath sounds with few  inspiratory crackles.  No wheezes or rhonchi are noted.  CARDIOVASCULAR:  Regular rate and rhythm.  No murmurs, rubs, or gallops  are noted.  ABDOMEN:  Soft, nondistended, and nontender.  The liver edge is not  palpable.  Bowel sounds are present.  EXTREMITIES:  Warm and adequately perfused.  There is trace bilateral  lower extremity edema.  Distal pulses are  not palpable in either lower  leg at the ankle.  There are chronic skin changes on both lower leg  consistent with longstanding diabetes with one minor skin abrasion.  There is no active cellulitis.  RECTAL AND GU:  Deferred.  NEUROLOGIC:  Grossly nonfocal and symmetrical throughout.   DIAGNOSTIC TESTS:  Transthoracic echocardiogram performed on May 05, 2008 is reviewed.  This reveals severe global left ventricular  dysfunction with ejection fraction estimated 10-20%.  There is mild-to-  moderate mitral regurgitation.  There is moderate left atrial  enlargement.  There is mild-to-moderate tricuspid regurgitation.   Cardiac catheterization performed May 07, 2008 is reviewed.  This  demonstrates diffuse three-vessel coronary artery disease.  There is 60-  70% proximal stenosis of the left anterior descending coronary artery.  There is a 100% occlusion of the diagonal branch.  The distal left  anterior descending coronary artery appears to be graftable target,  although it is somewhat diffusely disease and there is a distal 90%  lesion.  There is 60% proximal stenosis of the left circumflex coronary  artery before it gives rise to large circumflex marginal branch.  There  is 100% occlusion of the left circumflex coronary artery after takeoff  of the circumflex marginal branch.  There is a 100% occlusion of the mid-  right coronary artery disease with left-to-right collateral filling of  the posterior descending coronary artery and posterolateral branch.  These vessels are very diffusely disease and may not be graftable  target.  There is moderate pulmonary hypertension with PA pressures  measured 54/23, pulmonary capillary wedge pressure 25.  Cardiac output  measured 3.8 liters per minute with thermodilution, 5.9 liters per  minute using the method of Fick.  Central venous pressure was 11.   IMPRESSION:  Severe three-vessel coronary artery disease with ischemic  cardiomyopathy,  severe left ventricular dysfunction and class 2 to class  3 symptoms of congestive heart failure, chronic systolic and diastolic.  The patient has very diffuse coronary artery disease with relatively  poor distal target vessels for grafting.  However, both the circumflex  marginal branch and left anterior descending coronary artery appear  likely to be graftable targets.  Risks associated with surgical  intervention will be somewhat elevated, but not prohibitive and I  suspect that the patient's long-term survival would be best with  surgical revascularization.  MRI viability study may be helpful to  further define presence of significant viable myocardium.  The patient's  long-term prognosis will be poor if he cannot bring his chronic medical  illnesses under control and maintain them the remainder of his life.   PLAN:  I have discussed options at length with Mr. Deshler and his mother.  Alternative treatment strategies have been discussed.  We will await  results of MRI viability study.  We will also get pulmonary function  tests.  He probably should be treated for active bronchitis.  Mr. Kempner  insists ongoing home for brief period of time to settle a personal  matters prior to having elective surgery.  All of his questions have  been addressed.      Salvatore Decent. [name], M.D.  Electronically Signed     CHO/MEDQ  D:  05/08/2008  T:  05/08/2008  Job:  [id number]   cc:   [name], M.D.  [name], MD

## 2010-09-07 NOTE — Op Note (Signed)
[name], [name]                 ACCOUNT NO.:  [id number]   MEDICAL RECORD NO.:  [id number]          PATIENT TYPE:  INP   LOCATION:  2315                         FACILITY:  [facility]   PHYSICIAN:  Salvatore Decent. [name], M.D. DATE OF BIRTH:  1965-07-23   DATE OF PROCEDURE:  DATE OF DISCHARGE:                               OPERATIVE REPORT   PREOPERATIVE DIAGNOSIS:  Severe three-vessel coronary artery disease.   POSTOPERATIVE DIAGNOSIS:  Severe three-vessel coronary artery disease.   PROCEDURE:  Median sternotomy for coronary artery bypass grafting x3  (left internal mammary artery to distal left anterior descending  coronary artery, saphenous vein graft to circumflex marginal branch,  saphenous vein graft to posterior descending coronary artery, endoscopic  saphenous vein harvest from left thigh and left lower leg, core-matrix  patch reconstruction of pericardium).   SURGEON:  Salvatore Decent. [name], MD   ASSISTANT:  Doree Fudge, PA   ANESTHESIA:  General.   BRIEF CLINICAL NOTE:  The patient is a 45 year old male hospitalized on  May 02, 2008, with chronic systolic and diastolic congestive heart  failure.  The patient's cardiac risk factors include hypertension, type  2 diabetes mellitus, hyperlipidemia, and long-standing tobacco abuse as  well as family history of coronary artery disease.  The patient's  congestive heart failure was treated medically with substantial  symptomatic improvement.  Echocardiogram revealed severe left  ventricular dysfunction with dilated cardiomyopathy, ejection fraction  estimated 10%.  Left and right heart catheterization reveals severe  three-vessel coronary artery disease.  MRI viability study confirms the  presence of diffuse ischemic cardiomyopathy, but some associated  myocardial viability.  A full consultation note has been dictated  previously.  The patient now presents for elective surgical  revascularization.   OPERATIVE FINDINGS:  1.  Ischemic cardiomyopathy with preoperative ejection fraction      estimated 15%.  2. Good-quality left internal mammary artery and saphenous vein      conduit for grafting.  3. Diffuse coronary artery disease with relatively poor distal target      vessels for grafting.  4. Diagonal branch of the left anterior descending coronary artery too      diffusely diseased for grafting.  5. Substantial improvement in left ventricular function immediately      following surgical revascularization.   OPERATIVE NOTE IN DETAIL:  The patient was brought to the operating room  on the above-mentioned date and central monitoring was established by  the anesthesia team under the care and direction of Dr. [name].  Specifically, Swan-Ganz catheter was placed through the right  internal jugular approach.  A radial arterial line was placed.  Intravenous antibiotics were administered.  Following induction with  general endotracheal anesthesia, a Foley catheter was placed.  The  patient's chest, abdomen, both groins, and both lower extremities were  prepared and draped in sterile manner.   Baseline transesophageal echocardiogram was performed by Dr. [name].  This demonstrates severe left ventricular dysfunction with ejection  fraction estimated 15%.  There was inferior wall akinesis.  There was  global hypokinesis.  There was no mitral regurgitation.  No other  abnormalities were noted.   A median sternotomy incision was performed, and the left internal  mammary artery was dissected from the chest wall and prepared for bypass  grafting.  The left internal mammary artery was good-quality conduit.  Simultaneously, saphenous vein was obtained from the patient's left  thigh in the upper portion of the left lower leg using endoscopic vein  harvest technique.  Initially, a small incision was made in the right  lower leg just below the right knee, but the vein on that side appeared  somewhat small, so  the vein was removed from the left.  The saphenous  vein removed from the left thigh and left lower leg was good-quality  conduit for grafting.  After the saphenous vein had been removed, the  small incisions in both lower extremities were closed in multiple layers  with running absorbable suture.  The patient was heparinized  systemically, and the left internal mammary artery was transected  distally.  It was noted to have excellent flow.   The pericardium was opened.  The ascending aorta was normal in  appearance.  The ascending aorta and right atrium were cannulated for  cardiopulmonary bypass.  A retrograde cardioplegic cannula was placed  through the right atrium into the coronary sinus.  Cardiopulmonary  bypass was begun, and the surface of the heart was inspected.  There was  obvious ischemic cardiomyopathy with dilated left ventricle and ischemic  marbling particularly in the inferior wall.  There was diffuse coronary  artery disease with visible plaque in all of the coronary arteries.  The  diagonal branch off the left anterior descending coronary artery was too  small and diffusely diseased for grafting.  A temperature probe was  placed in the left ventricular septum, and a cardioplegia cannula was  placed in the ascending aorta.   The patient was allowed to cool passively to 32-degree systemic  temperature.  The aortic cross-clamp was applied and cold blood  cardioplegia was delivered initially in the antegrade fashion through  the aortic root.  Iced saline slush was applied for topical hypothermia,  and supplemental cardioplegia was administered retrograde through the  coronary sinus catheter.  The initial cardioplegic arrest was rapid with  rapid diastolic arrest.  Left ventricular myocardial cooling was rapid.  Repeat doses of cardioplegia were administered intermittently throughout  the cross-clamp portion of the operation through the aortic root, down  subsequently  placed vein grafts, and retrograde through the coronary  sinus catheter to maintain completely flat electrocardiogram and  myocardial temperature less than 15-degree centigrade.   The following distal coronary anastomoses were performed:  1. The circumflex marginal branch was grafted with saphenous vein      graft in an end-to-side fashion.  This vessel was diffusely      diseased and measured 1.7 mm in diameter at the site of distal      grafting.  It was a fair-quality target vessel.  2. The posterior descending coronary artery was grafted with a      saphenous vein graft in an end-to-side fashion.  This vessel was      diffusely disease, measured 1.1 mm in diameter at the site of      distal grafting.  It was the largest terminal branch of the distal      right circulation.  The posterolateral branches off the distal      right coronary artery were too diffusely diseased and small for  grafting.  3. The distal left anterior descending coronary artery was grafted      with left internal mammary artery in an end-to-side fashion.  This      vessel measured 1.7 mm in diameter and was a fair-quality target      vessel for grafting.  It was diffusely diseased, but a 1.5 probe      will pass easily in both directions.   Both proximal saphenous vein anastomoses were performed directly to the  ascending aorta prior to removal of the aortic cross-clamp.  The left  ventricular septal temperature rises rapidly with reperfusion of the  left internal mammary artery.  One final dose of warm retrograde hot  shot cardioplegia was administered.  The aortic cross-clamp was removed  after total cross-clamp time of 65 minutes.   The heart was defibrillated.  Normal sinus rhythm resumed.  Low-dose  milrinone infusion was begun.  All proximal and distal coronary  anastomoses were inspected for hemostasis and appropriate graft  orientation.  Epicardial pacing wires were fixed to the right   ventricular free wall into the right atrial appendage.  The patient was  rewarmed to 37-degree centigrade temperature.  The patient was weaned  from cardiopulmonary bypass without difficulty.  The patient's rhythm at  separation from bypass was normal sinus rhythm.  The patient weaned from  bypass on milrinone at 0.3 mcg/kg per minute and dopamine at 3 mcg/kg  per minute.  Dopamine was subsequently discontinued due to development  of tachycardia.  Total cardiopulmonary bypass time for the operation was  80 minutes.   Followup transesophageal echocardiogram performed by Dr. [name] after  separation from bypass demonstrates improved left ventricular function  with ejection fraction estimated greater than 40%.  No other  abnormalities were noted.   The venous and arterial cannulae were removed uneventfully.  Protamine  was administered to reverse anticoagulation.  The mediastinum and the  left chest were irrigated with saline solution.  Meticulous surgical  hemostasis was ascertained.  The mediastinum and the left pleural space  were drained using 3 chest tubes, exited through separate stab incisions  inferiorly.   The pericardium was reconstructed anteriorly using a patch of core-  matrix.  This was sewn to the free edges of the pericardium with running  4-0 Prolene suture.  The sternum was closed using double-strength  sternal wire.  The soft tissues anterior to the sternum were closed in  multiple layers, and skin was closed with a running subcuticular skin  closure.   The patient tolerated the procedure well and was transported to the  surgical intensive care unit in stable condition.  There were no  intraoperative complications.  All sponge, instrument, and needle counts  were verified correct at completion of the operation.       Salvatore Decent. [name], M.D.  Electronically Signed     CHO/MEDQ  D:  07/17/2008  T:  07/18/2008  Job:  [id number]   cc:   [name], MD   [name], M.D.

## 2010-09-07 NOTE — Consult Note (Signed)
[name], GOOGE NO.:  [id number]   MEDICAL RECORD NO.:  [id number]          PATIENT TYPE:  INP   LOCATION:  3709                         FACILITY:  [facility]   PHYSICIAN:  [name], M.D.   DATE OF BIRTH:  May 19, 1965   DATE OF CONSULTATION:  05/05/2008  DATE OF DISCHARGE:                                 CONSULTATION   CHIEF COMPLAINT/HISTORY OF PRESENT ILLNESS:  [name] is a 45 year old  male who is seen in consult May 05, 2008 for shortness of breath.  He had an abnormal EKG and multiple risk factors for coronary disease.  He was essentially admitted May 04, 2008 with dyspnea on exertion  and lower extremity edema.  His BNP is 211.  His EKG shows sinus rhythm,  LVH and poor anterior R-wave progression.  He has no documented coronary  disease.  He denies chest pain.   PAST MEDICAL HISTORY:  Remarkable for type 2 diabetes and hypertension  which were untreated.  There is mention of previous history of  schizophrenia.  He has had I and D of his left hand in July 2007 for  MRSA.   MEDICATIONS:  None.   ALLERGIES:  He has no known drug allergies but says he is intolerant to  [facility].   SOCIAL HISTORY:  He is single, he lives with his 69 year old son.  He  works at [employer] and has worked there for 6 years.  He says he  quit smoking cigarettes 10 days ago.  There is a past history of  substance abuse, although the patient says now he only uses marijuana  occasionally.   FAMILY HISTORY:  Please see admission history and physical for complete  details.  Apparently there is a family history of alcoholism.   REVIEW OF SYSTEMS:  The patient denies any chest pain.  Has not had  palpitations or syncope.  He has not had orthopnea or PND.   PHYSICAL EXAM:  VITAL SIGNS:  Blood pressure 140/90, pulse 75,  temperature 97.7, respirations 12.  GENERAL:  He is a well-developed, well-nourished male in no acute  distress.  HEENT is normocephalic.  He  has poor dentition.  NECK:  Without JVD or bruit.  CHEST:  Clear to auscultation percussion.  CARDIAC:  Exam reveals regular rate and rhythm without murmur or gallop.  Normal S1, S2.  ABDOMEN:  Nontender.  No hepatosplenomegaly.  No bruits.  EXTREMITIES:  Reveal trace edema with diminished distal pulses  bilaterally.  NEURO:  Grossly intact.  He is awake, alert, oriented, cooperative,  moves all extremities without obvious deficit.  SKIN:  Warm and dry.   EKG:  Shows sinus rhythm with Q-waves in II, III and aVF and V2-V4.   LABS:  White count 6.0, hemoglobin 12.5, hematocrit 37.5, platelets  122,000.  Sodium 142, potassium 3.9, BUN 19, creatinine 1.68.  BNP is  211.  LFTs normal.  LDL is 153.  TSH is 3.59.  His drug screen is  negative.   IMPRESSION:  1. Dyspnea on exertion and edema, no obvious heart failure on exam.  2. Abnormal EKG, rule out coronary disease.  3. Untreated diabetes.  4. Untreated hypertension.  5. History of smoking, the patient says he quit 10 days ago.  6. Questionable history of schizophrenia, the patient says he had been      employed for 6 years.  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   PLAN:  The patient is seen by Dr. Allyson Sabal.  Will need to watch his renal  function before proceeding with diagnostic catheterization.  Currently  pressure is controlled with clonidine.      Abelino Derrick, P.A.      [name], M.D.  Electronically Signed    LKK/MEDQ  D:  05/05/2008  T:  05/05/2008  Job:  [id number]   cc:   [facility]

## 2010-09-07 NOTE — Assessment & Plan Note (Signed)
OFFICE VISIT   [name], [name]  DOB:  01-01-1966                                        Sep 08, 2008  CHART #:  [id number]   The patient returns to the office for further followup, status post  coronary artery bypass grafting x3 on July 17, 2008.  The patient was  most recently seen here in the office on August 11, 2008.  Since then he  has done well.  He is back at work and back enjoying fairly good  physical activity.  He still has some soreness in his chest related to a  sternotomy, but this has improved substantially and it only seems to  bother him at night if he rolls over in bed and lays on it funny.  He is  no longer taking any sort of pain relievers.  He has not had any  shortness of breath.  He has not had any exertional chest tightness.  He  is not smoking.  He reports that his blood sugars have been under good  control.  His blood pressure is elevated here in the office today, and  he states that when it has been checked recently, it has not been that  bad.  The remainder of his review of systems unremarkable.   Current medications include aspirin, Coreg, glipizide, and pravastatin.   PHYSICAL EXAMINATION:  GENERAL:  A well-appearing male.  VITAL SIGNS:  Blood pressure 180/88, pulse 78, and oxygen saturation 99%  on room air.  CHEST:  Median sternotomy incision has healed nicely and the sternum is  stable on palpation.  LUNGS:  Breath sounds are clear to auscultation and symmetrical  bilaterally.  No wheezes or rhonchi are noted.  CARDIOVASCULAR:  Regular rate and rhythm.  No murmurs, rubs, or gallops  are appreciated.  ABDOMEN:  Soft and nontender.  EXTREMITIES:  Warm and well perfused.  The small incision in left lower  leg from endoscopic vein harvest has healed well.  There is no lower  extremity edema.  The remainder of his physical exam is unremarkable.   IMPRESSION:  The patient appears to be doing well.  His blood pressure  is running  up somewhat here in the office today, but otherwise he is  clinically coming along quite well.   PLAN:  I have suggested that the patient go back on hydralazine 25 mg  p.o. 3 times daily as he had been on it previously.  He states that he  believes that he has an appointment to see Dr. [name] within the next  couple of weeks.  I have suggested that he should resume hydralazine  until he goes back to see Dr. [name] to see if he wants to entertain  any other options for long-term blood pressure control.  In the future,  the patient will call and return to see [place] here only should further  problems or difficulties arise.  He has been reminded to avoid any heavy  lifting or strenuous use of his arms or shoulders for at least another  couple of months.  All of his questions have been addressed.   Salvatore Decent. [name], M.D.  Electronically Signed   CHO/MEDQ  D:  09/08/2008  T:  09/09/2008  Job:  [id number]   cc:   [name], MD

## 2010-09-07 NOTE — Cardiovascular Report (Signed)
NAME:  [name], [name] NO.:  [id number]   MEDICAL RECORD NO.:  [id number]          PATIENT TYPE:  INP   LOCATION:  3709                         FACILITY:  [facility]   PHYSICIAN:  Sheliah Mends, MD      DATE OF BIRTH:  August 10, 1965   DATE OF PROCEDURE:  DATE OF DISCHARGE:                            CARDIAC CATHETERIZATION   CARDIOLOGISTS:  [name], MD and Sheliah Mends, MD   INDICATIONS:  This is a 45 year old gentleman who was brought to [facility] with evidence of congestive heart failure exacerbation.  He has multiple risk factors for coronary artery disease including  uncontrolled hypertension, uncontrolled dyslipidemia, and type 2  diabetes mellitus.  He has a long-standing history of noncompliance with  medical therapy.  His initial workup showed evidence of a cardiomyopathy  with severe left ventricular dysfunction and an estimated ejection  fraction of 10%.  Therefore, the patient was referred for left and right  heart catheterization.  A left ventriculogram was deferred due to  history of chronic kidney disease.   PROCEDURE:  After informed consent was obtained, the patient was brought  to the second floor Cardiac Cath Lab at [facility].  The right  groin area was prepared in sterile fashion and local anesthesia using 1%  lidocaine was applied.  Access was obtained using the modified Seldinger  technique.  A 7-French sheath was inserted in the right femoral vein and  a 6-French sheath was inserted in the right common femoral artery.  A  Swan-Ganz right heart catheterization was performed and the following  pressures were obtained:  Right atrium 11 mmHg, right ventricle 54/6  mmHg, PA 54/23 mmHg, PCWP 25 mmHg; cardiac output by Fick 5.89 L per  minute, thermo 3.78 L per minute; and cardiac index by Fick 2.75, by  thermo 1.77.   Right heart catheterization was followed by a selective coronary  angiography of the left and right coronary  artery system.  Selective  coronary angiography of the left system showed a short left main artery  with a 40% ostial lesion.  The left main artery bifurcates in the LAD  and left circumflex artery.  The LAD is a medium-to-large-size vessel  that wraps around the apex.  The LAD has a 60% lesion in the proximal  segment.  Subsequently, a diagonal branch is given off with 100%  occlusion in the proximal segment.  The distal segment of the diagonal  artery is seen through left-to-left collaterals.  The mid and distal LAD  has moderate-to-severe diffuse disease with a 90% lesion in the distal  segment.   The left circumflex artery is a medium-size vessel.  It has a 60% lesion  in the proximal segment and a 100% occlusion after take-off of the  obtuse marginal branch.  The distal segments of the left circumflex is  seen for left-to-left collaterals.  The obtuse marginal branch has a 40%  lesion in the midsegment.   Selective coronary angiography of the right coronary artery reveals a  70% proximal lesion followed by a 90% lesion.  The  RCA has a 90% lesion  in the distal segment before the take-off of the PDA.  The PDA is 100%  occluded and visualized through left-to-right collaterals.   SUMMARY:  1. Severe 3-vessel coronary artery disease.  2. Severely depressed cardiac output and cardiac index.   RECOMMENDATIONS:  [name] presents with severe 3-vessel coronary  artery disease and evidence of ischemic cardiomyopathy with severe left  ventricular dysfunction.  He will be managed with aggressive medical  therapy including beta blocker, Ace inhibitor, diuretics, and aspirin.  Unfortunately, he has a long history of noncompliance with his medical  therapy which makes him a poor candidate for any type of intervention,  surgically or percutaneously.  At this time, we recommend to obtain a  viability study either by a cardiac MR or a PET viability study.  CT  surgery will be consulted to  evaluate this patient as a candidate for  CABG.  Should the patient be found to be a poor candidate for surgical  intervention, one could consider staged intervention to the LAD and RCA.  At the same time, placement of an ICD should be considered.   The procedure was completed without complications.   Dr. [name] was present throughout the procedure and prompted the  case.      Sheliah Mends, MD  Electronically Signed     JE/MEDQ  D:  05/07/2008  T:  05/08/2008  Job:  [id number]

## 2010-09-07 NOTE — H&P (Signed)
HISTORY AND PHYSICAL EXAMINATION   July 07, 2008   Re:  [name], Green             DOB:  July 11, 1965   DATE OF PLANNED HOSPITAL ADMISSION:  July 16, 2008.   PRESENTING CHIEF COMPLAINT:  Chest discomfort and shortness of breath.   HISTORY OF PRESENT ILLNESS:  The patient is a 45 year old white male  from [place], [place], who was recently hospitalized in January  2010 with chronic systolic and diastolic congestive heart failure.  The  patient has cardiac risk factors notable for hypertension, type 2  diabetes mellitus, hyperlipidemia, and longstanding tobacco abuse.  The  patient also has a family history of coronary artery disease.  The  patient stopped taking all of his medications several years ago due to  financial concerns and he had not seen a physician in the long-time.  During the early months of winter, the patient developed progressive  symptoms of exertional shortness of breath with bilateral lower  extremity edema.  Ultimately, the patient presented to the hospital in  early January, where he was noted to have congestive heart failure.  His  symptoms improved with medical therapy.  Echocardiogram was performed  demonstrating severe left ventricular dysfunction with dilated  cardiomyopathy.  Ejection fraction was estimated at 10%.  There was mild-  to-moderate mitral regurgitation.  The patient subsequently underwent  left and right heart catheterization.  He was found to have severe three-  vessel coronary artery disease with moderate-to-severe pulmonary  hypertension.  MRI viability study was then performed.  This confirmed  the presence of diffuse scarring throughout the left ventricle  consistent with severe ischemic cardiomyopathy.  There were areas of  viability present.  A full consultation note was dictated at that time  when I first met the patient on May 08, 2008.  Since then, he has  been treated medically and he has done  remarkably well.  He quit  smoking.  He has been taking care of matters at home that precluded him  from considering elective high-risk surgical revascularization.  He now  returns for followup and is ready to proceed with surgery.  He does  admit that he occasionally gets some tightness radiating across his left  chest that typically is brought on with stress and anxiety.  He states  when he takes his Xanax, it will go away.  He does not have a  prescription for sublingual nitroglycerin.  He states that his breathing  is much better than it was in January, and in fact at this point, he  denies any shortness of breath.  He has not had any recurrent lower  extremity edema.  He denies PND, orthopnea, palpitations, or syncopal  events.   REVIEW OF SYSTEMS:  General:  The patient reports normal appetite.  His  energy level is good.  Cardiac:  Notable for intermittent chest  tightness typically brought on with stress and anxiety.  These episodes  are typically transient according to the patient.  He describes only  mild exertional shortness of breath.  He denies PND, orthopnea, or lower  extremity edema.  Respiratory:  Negative.  The patient denies productive  cough, hemoptysis, or wheezing.  Gastrointestinal:  Negative.  The  patient reports no difficulty swallowing.  He denies hematochezia,  hematemesis, melena.  Neurologic:  Negative.  The patient denies  transient monocular blindness, transient numbness or weakness involving  either upper or lower extremity.  Musculoskeletal:  Notable for mild  arthritis and arthralgias afflicting both hands.  He otherwise denies  any significant limitations.  Genitourinary:  Negative.  HEENT:  Negative.  Infectious:  Negative.  Endocrine:  Notable for what the  patient reports as fairly good blood sugar control on his current  diabetic regimen.  He states that his blood sugar was 122 this morning  when he got up.   PAST MEDICAL HISTORY:  1.  Three-vessel coronary artery disease with ischemic cardiomyopathy.  2. Chronic systolic and diastolic congestive heart failure.  3. Ischemic mitral regurgitation.  4. Hypertension.  5. Type 2 diabetes mellitus.  6. Hyperlipidemia.  7. Longstanding tobacco abuse, quit in January 2010.  8. Remote alcohol abuse.  9. Remote polysubstance abuse including cocaine and marijuana.  10.Questionable history of psychiatric illness.  11.Chronic renal insufficiency with baseline creatinine 1.7.   PAST SURGICAL HISTORY:  Left hand surgery for methicillin-resistant  Staphylococcus aureus infection in 2007.   FAMILY HISTORY:  Notable for the patient's mother suffered from diabetes  and ischemic heart disease.   SOCIAL HISTORY:  The patient is divorced and lives with his 53 year old  son.  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  He quit smoking just prior to the Christmas holiday and has  not smoked for the last 2-1/2 months.  Prior to that, he smoked 2 packs  a day for many years.  He has a history of heavy alcohol abuse in the  distant past, but none recently.  He has a remote history of marijuana  and cocaine use.  He denies any history of IV drug use.   CURRENT MEDICATIONS:  1. Aspirin 81 mg daily.  2. Coreg 25 mg twice daily.  3. Lasix 40 mg daily.  4. Glipizide 5 mg daily.  5. Imdur 30 mg daily.  6. Hydralazine 25 mg 3 times daily.  7. Pravastatin 40 mg daily.   DRUG ALLERGIES:  None known.   PHYSICAL EXAMINATION:  GENERAL:  The patient is a well-appearing male,  who appears his stated age or somewhat older, in no acute distress.  VITAL SIGNS:  Blood pressure 154/84, pulse 63, and oxygen saturation 98%  on room air.  HEENT:  Unrevealing.  NECK:  Supple.  There are no carotid bruits.  There is no jugular venous  distention.  LUNGS:  Auscultation of the chest demonstrates clear breath sounds  bilaterally.  No wheezes or rhonchi are noted.   CARDIOVASCULAR:  Regular rate and rhythm.  No murmurs, rubs, or gallops  are noted.  ABDOMEN:  Soft, nondistended, nontender.  There are no palpable masses.  Bowel sounds are present.  EXTREMITIES:  Warm and adequately perfused.  There is no lower extremity  edema.  Distal pulses are thready, but palpable in the dorsalis pedis  position.  RECTAL/GU:  Both deferred.  NEUROLOGIC:  Grossly nonfocal and symmetrical throughout.   IMPRESSION:  Severe three-vessel coronary artery disease with ischemic  cardiomyopathy, profound left ventricular dysfunction.  The patient has  chronic systolic and diastolic congestive heart failure that has  improved dramatically with medical therapy.  He remains quite stable.  He continues to have intermittent episodes of chest tightness that are  typically brought on with stress and anxiety.  I suspect this is angina.  Risks associated with surgical intervention will be high due to the  presence of profound left ventricular dysfunction with associated  comorbid medical conditions as discussed previously.  However, I suspect  that he would  benefit from surgery in terms of improved long-term  survival.  Prognosis without surgical intervention would be grim.   PLAN:  I have reiterated the indications, risks, and potential benefits  of surgery with the patient this afternoon.  Alternative treatment  strategies have been discussed.  He understands the somewhat high risk  nature of surgery.  He understands and accepts all potential associated  risks including but not limited to risk of death, stroke, congestive  heart failure, respiratory failure, renal failure, multisystem organ  failure, pneumonia, bleeding requiring blood transfusion, arrhythmia,  heart block, bradycardia requiring permanent pacemaker, late recurrence  of congestive heart failure, and/or myocardial infarction, and/or angina  pectoris.  All of his questions have been addressed.  We tentatively   plan to proceed with surgery on Wednesday, July 16, 2008.  The patient  has been given a prescription for sublingual nitroglycerin to utilize  between now and at the time of surgery should he develop any episodes of  chest tightness.   Salvatore Decent. [name], M.D.  Electronically Signed   CHO/MEDQ  D:  07/07/2008  T:  07/08/2008  Job:  [id number]   cc:   [name], MD

## 2010-09-07 NOTE — Assessment & Plan Note (Signed)
OFFICE VISIT   ORANGE, HILLIGOSS  DOB:  Jan 07, 1966                                        June 16, 2008  CHART #:  [id number]   The patient returns to the office today for further followup of severe 3-  vessel coronary artery disease with ischemic cardiomyopathy.  He was  last seen here in the office on May 19, 2008.  He reports that since  then, he has continued to remain quite stable and overall, he feels  well.  He is still not ready to consider proceeding with surgery.  He  thinks that by the end of March, she may be ready, providing that the  weather warms up.  We again discussed the fact that he remains at  significant risk for a variety of potential complications and/or death  due to his underlying ischemic heart disease; however, the risks  associated with surgery will be considerable, and there is probably no  reason for him to return to our office until he is ready to consider  proceeding with surgery.  We will schedule an appointment for him to  return in 4 weeks.  All of his questions have been addressed.   Salvatore Decent. [name], M.D.  Electronically Signed   CHO/MEDQ  D:  06/16/2008  T:  06/16/2008  Job:  [id number]   cc:   [name], MD

## 2010-09-10 NOTE — Op Note (Signed)
NAME:  [name], [name]                 ACCOUNT NO.:  [id number]   MEDICAL RECORD NO.:  [id number]          PATIENT TYPE:  INP   LOCATION:  5025                         FACILITY:  [facility]   PHYSICIAN:  Artist Pais. Weingold, M.D.DATE OF BIRTH:  1966/02/14   DATE OF PROCEDURE:  DATE OF DISCHARGE:                                 OPERATIVE REPORT   PREOPERATIVE DIAGNOSIS:  Left fifth digit left hand extensor sheath  infection.   POSTOPERATIVE DIAGNOSIS:  Left fifth digit left hand extensor sheath  infection.   PROCEDURE:  I&D left fifth digit left hand extensor sheath infection with  culturing.   SURGEON:  Artist Pais. Mina Marble, M.D.   ASSISTANT:  None.   ANESTHESIA:  General.   TOURNIQUET TIME:  25 minutes.   COMPLICATIONS:  None.   DRAINS:  None.   OPERATIVE REPORT:  Patient was taken to the operating room after induction  of adequate general anesthesia.  Left upper extremity was prepped and draped  in the usual sterile fashion.  An Esmarch was used to exsanguinate limb,  tourniquet was then inflated 250 mmHg.  At this point and time a longitudal  incision was made from the middle of the middle phalanx at the metacarpal  phalangeal joint.  The skin was incised.  Material was seen coming from the  extensor sheath.  This was cultured for both aerobic and anaerobic and stat  gram stain.  Sediment was carried down both radially and ulnarly.  Flaps  were elevated.  The purulent material was then irrigated out with 3 liters  of normal saline using a pulse lavage.  After this was done any devitalized  tissue was debrided using a rongeur carefully.  Following by packing the  wound open with 1/4 inch iodoform gauze and three loose 4-0 sutures.  Sterile dressing and 4x4 slats and compressive wrap was applied.  Patient  tolerated the procedure well.      Artist Pais Mina Marble, M.D.  Electronically Signed     MAW/MEDQ  D:  11/18/2005  T:  11/19/2005  Job:  [id number]

## 2010-09-10 NOTE — Discharge Summary (Signed)
NAMEDERIN, GRANQUIST NO.:  [id number]   MEDICAL RECORD NO.:  [id number]          PATIENT TYPE:  INP   LOCATION:  5025                         FACILITY:  [facility]   PHYSICIAN:  Isidor Holts, M.D.  DATE OF BIRTH:  05/14/1965   DATE OF ADMISSION:  11/16/2005  DATE OF DISCHARGE:                                 DISCHARGE SUMMARY   DISCHARGE DIAGNOSES:  1.  Left hand and left fifth digit cellulitis/abscess/tendinitis, requiring      debridement.  2.  Type 2 diabetes mellitus.  3.  Hypertension.  4.  History of schizophrenia.  5.  History of polysubstance abuse, i.e., marijuana/cocaine.  6.  Remote history of alcohol abuse.   DISCHARGE MEDICATIONS:  To be listed in addendum at the time of actual  discharge by the discharging MD.   PROCEDURES:  1.  X-ray, left hand, dated November 16, 2005:  This showed no acute findings.      There was some irregularity involving the distal fourth metacarpal,      which may be post-traumatic in nature, sclerotic density involving the      radial styloid, probably represents a bone island.  2.  Two view chest x-ray, dated November 16, 2005:  This showed no acute      cardiopulmonary disease.  3.  Two view chest x-ray dated November 17, 2005:  This showed no active      cardiopulmonary disease.   CONSULTATIONS:  1.  Dr. Dairl Ponder, hand surgeon.  2.  Dr. Johny Sax, infectious disease.   ADMISSION HISTORY:  As in H&P notes of November 16, 2005, dictated by Dr.  Michaelyn Barter. However, in brief, this is a 45 year old male, with known  history of type 2 diabetes mellitus, hypertension, schizophrenia, who  presents with pain, swelling, and pustulation of the fifth digit of his left  hand.  The patient had ruptured the pustule, and began soaking the affected  digit in Epsom salts and applying frozen packs of hot dogs to the swollen  area, however, the swelling became progressive, the patient developed a  flexion contracture of the  affected digit, also swelling and redness of the  dorsum of his left hand.  He eventually came to the emergency department.   CLINICAL COURSE:  1.  Left fifth digit and hand cellulitis/abscess/tendinitis:  For detailed      presentation, refer to admission history above.  Patient was initially      commenced on intravenous Unasyn and analgesics.  Surgical consultation      was called, which was kindly provided by Dr. Mina Marble, who performed      incision, drainage, and debridement on November 18, 2005.  Wound swabs      showed the following findings:  Gram positive cocci in clusters,      subsequently identified as Staphylococcus aureus.  In view of background      history of diabetes mellitus and also of aggressiveness of infection, it      was felt that ID consultation was warranted.  This was kindly provided  by Dr. Johny Sax.  Patient was switched to intravenous vancomycin      therapy.  On November 20, 2005, the patient underwent repeat I&D, as well as      secondary wound closure.  Physiotherapy has been commenced.  The patient      has remained afebrile during the course of his hospital stay, and white      cell count has remained within normal limits.   1.  Type 2 diabetes mellitus:  This was managed with a combination of      carbohydrate-modified diet, sliding-scale insulin coverage, as well as      low dose Lantus.  Glipizide was added to patient's treatment on November 20, 2005, as it is anticipated that following discharge, the patient will be      unable to continue with insulin injections.  He does not strike me as      being very compliant, besides, he does have a background history of      schizophrenia.  It is anticipated that Lantus may be discontinued in due      course prior to discharge, and patient managed only with glipizide.   1.  History of schizophrenia:  Patient's mood has remained stable during the      course of his hospital stay.   1.  Hypertension:   This has been adequately controlled with ACE inhibitor      treatment, during the course of the patient's hospital stay.   1.  History of polysubstance abuse:  Patient has a history of cocaine and      marijuana abuse.  He has been counseled appropriately.   DISPOSITION:  This will be elucidated in detail at the time of actual  discharge, by discharging MD. However, it is anticipated that the patient  will recover sufficiently to be subsequently discharged, and may continue  outpatient physiotherapy/occupational therapy.  The patient is going to need  a primary MD for continuing medical problems, including type 2 diabetes  mellitus and hypertension.  Of note, the patient's lipid profile showed the  following findings:  Total cholesterol 128, triglycerides 104, HDL 32, LDL  75, i.e., excellent lipid profile.      Isidor Holts, M.D.  Electronically Signed     CO/MEDQ  D:  11/20/2005  T:  11/20/2005  Job:  [id number]

## 2010-09-10 NOTE — Op Note (Signed)
NAME:  BRANDOL, CORP                 ACCOUNT NO.:  [id number]   MEDICAL RECORD NO.:  [id number]          PATIENT TYPE:  INP   LOCATION:  5025                         FACILITY:  [facility]   PHYSICIAN:  Artist Pais. 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   PROCEDURE:  Repeat I&D and secondary wound closure.   SURGEON:  Artist Pais. Mina Marble, M.D.   ASSISTANT:  None.   ANESTHESIA:  General.   TOURNIQUET TIME:  No tourniquet.   DRAINS:  None.   COMPLICATIONS:  None.   PROCEDURE IN DETAIL:  The patient was taken to the operating room after the  induction of adequate general anesthesia.  The left upper extremity was  prepped in the usual sterile fashion.  Once this was done, the packing that  had been placed in his left little finger was removed.  The three sutures  that were used to closely loosely approximate the skin were removed as well.  This was then debrided of any non-viable material.  There was no re-  accumulation of purulent material.  The extensor sheath appeared to be  fairly clean.  Whatever small bits of devitalized tissue were there were  debrided using a rongeur.  It was then thoroughly irrigated with pulsatile  lavage with bleeders and normal saline.  Again, after the procedure it was  loosely closed with 3-0 nylon, it was then placed in sterile dressing of  Xeroform, 4x4's and a loose dressing.  The patient tolerated the procedure  well and went to recovery room in stable fashion.      Artist Pais Mina Marble, M.D.  Electronically Signed     MAW/MEDQ  D:  11/20/2005  T:  11/20/2005  Job:  [id number]

## 2010-09-10 NOTE — Discharge Summary (Signed)
[name], JANES NO.:  [id number]   MEDICAL RECORD NO.:  [id number]          PATIENT TYPE:  INP   LOCATION:  5025                         FACILITY:  [facility]   PHYSICIAN:  Lonia Blood, M.D.       DATE OF BIRTH:  [birth year]   DATE OF ADMISSION:  11/16/2005  DATE OF DISCHARGE:  11/22/2005                                 DISCHARGE SUMMARY   PATIENT'S PRIMARY CARE PHYSICIAN:  Will be [facility].  For complete list  of discharge diagnoses, refer to the dictated discharge summary done by Dr.  Brien Few.   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   PROCEDURE:  Incision and debridement of the left hand cellulitis and abscess  by Dr. Dairl Ponder on [id number], [id number].   For admission history and clinical course covering the period from July25,  2007 to November 20, 2005, refer to the previously dictated discharge summary  done by Dr. Brien Few.  For hospital course covering the period GEXB28, 2007 and  July31, 2007 during this period of time, Mr. Lalani has had made steady  improvement.  He has been seen on a daily basis by Dr. Mina Marble and Dr. Maurice March  from infectious disease.  The plan of treatment will involve 10 days of  intravenous antibiotics at home so Mr. Boys had a percutaneously inserted  central catheter placed on July31, 2007.  She will have intravenous  antibiotics arranged through [facility].  Also the patient  was changed to oral antidiabetics using glipizide and metformin with good  CBG control.   DISCHARGE MEDICATIONS:  1.  Vancomycin 1500 mg IV q.12h. to be discontinued on December 01, 2005.  The      patient's PICC line to be removed on August9, 2007.  2.  Metformin 500 mg by mouth twice a day.  3.  Glipizide 20 mg daily.  4.  Lisinopril 20 mg daily.  5.  Percocet 5/325 one to two every 4-6 hours as needed for pain.   At the time of discharge, the patient had an eligibility appointment on  August14, 2007 at  [facility].  He had medical visit for his  diabetes on August7, 2007 scheduled at [facility] and the  patient had been scheduled for a visit with Dr. Mina Marble on August7, 2007  for his hand abscess.  The patient was also set up with home health nursing  for daily dressing changes and daily intravenous antibiotics.      Lonia Blood, M.D.  Electronically Signed    SL/MEDQ  D:  11/22/2005  T:  11/22/2005  Job:  [id number]   cc:   Artist Pais Mina Marble, M.D.

## 2010-09-10 NOTE — H&P (Signed)
[name], [name] NO.:  [id number]   MEDICAL RECORD NO.:  [id number]          PATIENT TYPE:  EMS   LOCATION:  ED                           FACILITY:  [facility]   PHYSICIAN:  Michaelyn Barter, M.D. DATE OF BIRTH:  [birth year]   DATE OF ADMISSION:  11/16/2005  DATE OF DISCHARGE:                                HISTORY & PHYSICAL   PRIMARY CARE PHYSICIAN:  Unassigned.   CHIEF COMPLAINT:  Left hand pain.   HISTORY OF PRESENT ILLNESS:  [name] is a 45 year old gentleman with a  past medical history of diabetes mellitus and hypertension who states that  last Friday at approximately 10:00 p.m., his left small finger started  throbbing.  He was watching TV at the time.  Approximately an hour and a  half to two hours later, his left small finger started to feel tight.  The  finger adjacent to it as well as his knuckles also began to feel tight.  He  developed a blister on his smallest finger.  He popped it.  A white fluid  was released.  He states that since that time, his finger has continued to  swell.  He came to the ER at [facility] on Monday and stated that he sat in  the waiting area for approximately three hours and decided to leave before  being evaluated.  His hand has continued to hurt.  He had been soaking it in  Epsom salts and applying frozen packs of hot dogs to the swollen area.  Likewise, he has also been keeping his hand elevated.  He went on to state  that he had been applying a needle and a razor blade to the finger to try to  relieve the pressure; however, this did not work.  He denies having any  fevers.  He has had some chills off and on.  No nausea or vomiting.  He  denies having any trauma to his left hand.  He noticed some skin color  changes this past Saturday after using the razor blade and needle.  He  states that the left smallest finger has been stiff, and the range of motion  has been decreased over the past few days.   PAST MEDICAL  HISTORY:  1. Diabetes mellitus:  Patient states that he stopped taking his      medications approximately 8-9 years ago secondary to the development of      hematuria, which he believed was associated with the medications.  2. Hypertension.  Likewise, the patient has not taken any antihypertensive      medications for at least nine years.  3. Hematuria.  The patient stated that this occurred approximately nine      years ago, and he believed that the medications induced it.  4. History of schizophrenia.   PAST SURGICAL HISTORY:  Cyst removed from the left ear at the age of 4  and the lower back.   ALLERGIES:  Prudy Feeler makes the patient mean.   HOME MEDICATIONS:  Patient currently takes no medications.   SOCIAL HISTORY:  1.  Marijuana:  Patient smokes marijuana 1-2 times a week.  He started      smoking marijuana at the age of 26.  2. Cocaine:  Patient admits to snorting cocaine in the past.  He states      that the last time he used cocaine was two years ago.  He started      snorting at the age of 82.  3. LSD:  Patient started using LSD at the age of 73.  The last time he      used it was at the age of 3.  4. Alcohol:  The patient has a history of alcohol abuse but states that he      now drinks only once or twice a year.   FAMILY HISTORY:  Mother has a history of diabetes mellitus, hypertension,  hyperlipidemia, heart problems.  Father:  The patient does not know his  father's past medical history.   REVIEW OF SYSTEMS:  As per HPI.   PHYSICAL EXAMINATION:  VITAL SIGNS:  Temperature 100.9, blood pressure  159/94.  O2 sat 99%.  Respiratory 20.  Heart rate 95.  GENERAL:  Patient is awake, cooperative.  He is in no obvious distress.  HEENT:  Normocephalic and atraumatic.  Extraocular movements are intact.  Anicteric.  Pupils are equal and reactive to light.  Oral mucosa is moist.  Poor dentition with several missing teeth.  RESPIRATORY:  No crackles or wheezes.  CARDIAC:  S1  and S2 is present.  Regular rate and rhythm.  ABDOMEN:  Soft, nontender, nondistended.  Positive bowel sounds.  No  organomegaly.  EXTREMITIES:  The left hand, smallest finger is swollen, erythematous, with  multiple small wounds present.  No active discharge at this particular time.  There are multiple areas of dark discoloration evident.  The adjacent finger  is also slightly swollen but less so than the smallest finger.  Sensation is  intact throughout the entire smallest finger.  There is some slight  decreased range of motion with regards to the finger also.  NEUROLOGIC:  Patient is alert and oriented x3.   LABS:  White blood cell count is 11.3, hemoglobin 13, hematocrit 38.4,  platelets 187.  Urinalysis:  Glucose is greater than 1000, ketones 15, blood  moderate, WBCs 0-2.   ASSESSMENT/PLAN:  1. Left hand/finger cellulitis/infection:  We will start the patient on      empiric IV antibiotics consisting Unasyn for now.  We will request      wound cultures and provide p.r.n. pain medications.  Hand surgery, Dr.      Mina Marble, has seen the patient while in the ER.  I spoke to Dr.      Mina Marble twice while the patient was in the ER.  He stated that he will      continue to follow the patient for now.  Initially, he gave some      consideration to performing surgery; however, he is reluctant to do so      at this particularly time.  Likewise, the patient is also very      reluctant to undergo any type of a surgical procedure.  Dr. Mina Marble      stated that he would continue to follow the patient and will see the      patient again tomorrow.  He made the recommendations for now that the      patient be treated conservatively.  He indicated that warm, soapy soaks  should be applied, at least three times a day and also consultation for      hydrotherapy should also be arranged.  2. Diabetes mellitus:  I do not currently see any documentation of the     patient's current glucose.  Will  order a CBG.  Will start the patient      on sliding-scale insulin as well as regular Accu-Cheks for now.  Will      check a hemoglobin A1C as well as fasting lipid profile and start the      patient on aspirin therapy.  Will re-evaluate the patient in the      morning and at that particular time decide on the best diabetic      medication that should be started on this patient.  I do not have any      sugars right now to make that determination, so will follow the patient      over the next few hours.  3. Hypertension:  The patient's blood pressure was elevated at the time of      admission.  This could have been secondary to pain, but again, the      patient does have a history of hypertension with nonmedical compliance.      Will consider starting the patient on an ACE inhibitor; however, I do      not have a basic metabolic profile;      therefore, I do not know what the patient's BUN and creatinine are.      Will order those labs and re-evaluate the patient's blood pressure in      the morning and consider starting an ACE inhibitor.  4. Gastrointestinal prophylaxis:  Will provide Protonix.      Michaelyn Barter, M.D.  Electronically Signed     OR/MEDQ  D:  11/16/2005  T:  11/16/2005  Job:  [id number]

## 2011-03-19 ENCOUNTER — Emergency Department (HOSPITAL_COMMUNITY): Payer: Medicaid Other

## 2011-03-19 ENCOUNTER — Other Ambulatory Visit: Payer: Self-pay

## 2011-03-19 ENCOUNTER — Inpatient Hospital Stay (HOSPITAL_COMMUNITY)
Admission: EM | Admit: 2011-03-19 | Discharge: 2011-03-23 | DRG: 281 | Disposition: A | Discharge status: Home / Self Care | Payer: Medicaid Other | Attending: Cardiovascular Disease | Admitting: Cardiovascular Disease

## 2011-03-19 DIAGNOSIS — D696 Thrombocytopenia, unspecified: Secondary | ICD-10-CM | POA: Diagnosis present

## 2011-03-19 DIAGNOSIS — I214 Non-ST elevation (NSTEMI) myocardial infarction: Secondary | ICD-10-CM | POA: Diagnosis present

## 2011-03-19 DIAGNOSIS — R739 Hyperglycemia, unspecified: Secondary | ICD-10-CM

## 2011-03-19 DIAGNOSIS — E785 Hyperlipidemia, unspecified: Secondary | ICD-10-CM | POA: Diagnosis present

## 2011-03-19 DIAGNOSIS — N189 Chronic kidney disease, unspecified: Secondary | ICD-10-CM | POA: Diagnosis present

## 2011-03-19 DIAGNOSIS — F17200 Nicotine dependence, unspecified, uncomplicated: Secondary | ICD-10-CM | POA: Diagnosis present

## 2011-03-19 DIAGNOSIS — I249 Acute ischemic heart disease, unspecified: Secondary | ICD-10-CM

## 2011-03-19 DIAGNOSIS — I129 Hypertensive chronic kidney disease with stage 1 through stage 4 chronic kidney disease, or unspecified chronic kidney disease: Secondary | ICD-10-CM | POA: Diagnosis present

## 2011-03-19 DIAGNOSIS — I255 Ischemic cardiomyopathy: Secondary | ICD-10-CM | POA: Diagnosis present

## 2011-03-19 DIAGNOSIS — I509 Heart failure, unspecified: Secondary | ICD-10-CM | POA: Diagnosis present

## 2011-03-19 DIAGNOSIS — E119 Type 2 diabetes mellitus without complications: Secondary | ICD-10-CM | POA: Diagnosis present

## 2011-03-19 DIAGNOSIS — I2510 Atherosclerotic heart disease of native coronary artery without angina pectoris: Principal | ICD-10-CM | POA: Diagnosis present

## 2011-03-19 DIAGNOSIS — I10 Essential (primary) hypertension: Secondary | ICD-10-CM | POA: Diagnosis present

## 2011-03-19 DIAGNOSIS — N179 Acute kidney failure, unspecified: Secondary | ICD-10-CM | POA: Diagnosis present

## 2011-03-19 DIAGNOSIS — I2589 Other forms of chronic ischemic heart disease: Secondary | ICD-10-CM | POA: Diagnosis present

## 2011-03-19 DIAGNOSIS — I5189 Other ill-defined heart diseases: Secondary | ICD-10-CM | POA: Diagnosis present

## 2011-03-19 DIAGNOSIS — Z720 Tobacco use: Secondary | ICD-10-CM | POA: Diagnosis present

## 2011-03-19 DIAGNOSIS — I5042 Chronic combined systolic (congestive) and diastolic (congestive) heart failure: Secondary | ICD-10-CM | POA: Diagnosis present

## 2011-03-19 DIAGNOSIS — F1210 Cannabis abuse, uncomplicated: Secondary | ICD-10-CM | POA: Diagnosis present

## 2011-03-19 DIAGNOSIS — Z951 Presence of aortocoronary bypass graft: Secondary | ICD-10-CM

## 2011-03-19 HISTORY — DX: Essential (primary) hypertension: I10

## 2011-03-19 HISTORY — DX: Atherosclerotic heart disease of native coronary artery without angina pectoris: I25.10

## 2011-03-19 HISTORY — DX: Heart failure, unspecified: I50.9

## 2011-03-19 LAB — COMPREHENSIVE METABOLIC PANEL
ALT: 8 U/L (ref 0–53)
BUN: 36 mg/dL — ABNORMAL HIGH (ref 6–23)
CO2: 22 mEq/L (ref 19–32)
Calcium: 9 mg/dL (ref 8.4–10.5)
Creatinine, Ser: 3.12 mg/dL — ABNORMAL HIGH (ref 0.50–1.35)
GFR calc Af Amer: 26 mL/min — ABNORMAL LOW (ref >90)
GFR calc non Af Amer: 22 mL/min — ABNORMAL LOW (ref >90)
Glucose, Bld: 401 mg/dL — ABNORMAL HIGH (ref 70–99)
Sodium: 134 mEq/L — ABNORMAL LOW (ref 135–145)

## 2011-03-19 LAB — CBC
HCT: 38.1 % — ABNORMAL LOW (ref 39.0–52.0)
Hemoglobin: 13.2 g/dL (ref 13.0–17.0)
MCH: 29.5 pg (ref 26.0–34.0)
MCHC: 34.6 g/dL (ref 30.0–36.0)
MCV: 85.2 fL (ref 78.0–100.0)
RBC: 4.47 MIL/uL (ref 4.22–5.81)

## 2011-03-19 LAB — POCT I-STAT TROPONIN I: Troponin i, poc: 0.1 ng/mL (ref 0.00–0.08)

## 2011-03-19 LAB — GLUCOSE, CAPILLARY: Glucose-Capillary: 230 mg/dL — ABNORMAL HIGH (ref 70–99)

## 2011-03-19 LAB — CARDIAC PANEL(CRET KIN+CKTOT+MB+TROPI)
CK, MB: 6.4 ng/mL (ref 0.3–4.0)
Relative Index: 4.5 — ABNORMAL HIGH (ref 0.0–2.5)
Total CK: 143 U/L (ref 7–232)
Troponin I: 0.91 ng/mL (ref <0.30)

## 2011-03-19 LAB — APTT: aPTT: 35 seconds (ref 24–37)

## 2011-03-19 LAB — MAGNESIUM: Magnesium: 2.1 mg/dL (ref 1.5–2.5)

## 2011-03-19 LAB — PROTIME-INR: Prothrombin Time: 12.5 seconds (ref 11.6–15.2)

## 2011-03-19 MED ORDER — INSULIN ASPART 100 UNIT/ML ~~LOC~~ SOLN
0.0000 [IU] | Freq: Three times a day (TID) | SUBCUTANEOUS | Status: DC
  Administered 2011-03-20: 8 [IU] via SUBCUTANEOUS
  Administered 2011-03-20: 11 [IU] via SUBCUTANEOUS
  Administered 2011-03-20: 8 [IU] via SUBCUTANEOUS
  Administered 2011-03-21: 3 [IU] via SUBCUTANEOUS
  Administered 2011-03-21: 15 [IU] via SUBCUTANEOUS
  Administered 2011-03-21: 11 [IU] via SUBCUTANEOUS
  Administered 2011-03-22: 3 [IU] via SUBCUTANEOUS
  Filled 2011-03-19: qty 3

## 2011-03-19 MED ORDER — ACETAMINOPHEN 325 MG PO TABS
650.0000 mg | ORAL_TABLET | ORAL | Status: DC | PRN
  Administered 2011-03-19 – 2011-03-21 (×2): 650 mg via ORAL
  Filled 2011-03-19 (×2): qty 2

## 2011-03-19 MED ORDER — HEPARIN (PORCINE) IN NACL 100-0.45 UNIT/ML-% IJ SOLN
1000.0000 [IU]/h | INTRAMUSCULAR | Status: DC
  Administered 2011-03-19: 1000 [IU]/h via INTRAVENOUS
  Filled 2011-03-19 (×2): qty 250

## 2011-03-19 MED ORDER — SODIUM CHLORIDE 0.9 % IV SOLN
INTRAVENOUS | Status: DC
  Administered 2011-03-19 – 2011-03-21 (×3): via INTRAVENOUS

## 2011-03-19 MED ORDER — NICOTINE 21 MG/24HR TD PT24
21.0000 mg | MEDICATED_PATCH | Freq: Every day | TRANSDERMAL | Status: DC
  Administered 2011-03-19 – 2011-03-23 (×3): 21 mg via TRANSDERMAL
  Filled 2011-03-19 (×6): qty 1

## 2011-03-19 MED ORDER — HEPARIN BOLUS VIA INFUSION
4000.0000 [IU] | Freq: Once | INTRAVENOUS | Status: AC
  Administered 2011-03-19: 4000 [IU] via INTRAVENOUS
  Filled 2011-03-19: qty 4000

## 2011-03-19 MED ORDER — INSULIN ASPART 100 UNIT/ML ~~LOC~~ SOLN
8.0000 [IU] | Freq: Once | SUBCUTANEOUS | Status: AC
  Administered 2011-03-19: 8 [IU] via SUBCUTANEOUS
  Filled 2011-03-19: qty 1

## 2011-03-19 MED ORDER — ONDANSETRON HCL 4 MG/2ML IJ SOLN: 4.0000 mg | Freq: Four times a day (QID) | INTRAMUSCULAR | Status: DC | PRN

## 2011-03-19 MED ORDER — NITROGLYCERIN 0.4 MG SL SUBL: 0.4000 mg | SUBLINGUAL_TABLET | SUBLINGUAL | Status: DC | PRN

## 2011-03-19 MED ORDER — INSULIN REGULAR HUMAN 100 UNIT/ML IJ SOLN: 8.0000 [IU] | Freq: Once | INTRAMUSCULAR | Status: DC

## 2011-03-19 MED ORDER — METOPROLOL TARTRATE 25 MG PO TABS
25.0000 mg | ORAL_TABLET | Freq: Once | ORAL | Status: AC
  Administered 2011-03-19: 25 mg via ORAL
  Filled 2011-03-19: qty 1

## 2011-03-19 MED ORDER — LEVALBUTEROL HCL 0.63 MG/3ML IN NEBU
0.6300 mg | INHALATION_SOLUTION | Freq: Three times a day (TID) | RESPIRATORY_TRACT | Status: DC
  Administered 2011-03-20 – 2011-03-23 (×11): 0.63 mg via RESPIRATORY_TRACT
  Filled 2011-03-19 (×14): qty 3

## 2011-03-19 MED ORDER — ROSUVASTATIN CALCIUM 10 MG PO TABS
10.0000 mg | ORAL_TABLET | Freq: Every day | ORAL | Status: DC
  Administered 2011-03-20 – 2011-03-23 (×4): 10 mg via ORAL
  Filled 2011-03-19 (×4): qty 1

## 2011-03-19 MED ORDER — ASPIRIN EC 325 MG PO TBEC
325.0000 mg | DELAYED_RELEASE_TABLET | Freq: Every day | ORAL | Status: DC
  Administered 2011-03-20: 325 mg via ORAL
  Filled 2011-03-19: qty 1

## 2011-03-19 MED ORDER — NITROGLYCERIN IN D5W 200-5 MCG/ML-% IV SOLN
5.0000 ug/min | INTRAVENOUS | Status: DC
  Administered 2011-03-19: 10 ug/min via INTRAVENOUS
  Filled 2011-03-19: qty 250

## 2011-03-19 MED ORDER — HYDRALAZINE HCL 25 MG PO TABS
25.0000 mg | ORAL_TABLET | Freq: Three times a day (TID) | ORAL | Status: DC
  Administered 2011-03-20 – 2011-03-23 (×10): 25 mg via ORAL
  Filled 2011-03-19 (×13): qty 1

## 2011-03-19 MED ORDER — ASPIRIN 81 MG PO CHEW: 324.0000 mg | CHEWABLE_TABLET | Freq: Once | ORAL | Status: DC

## 2011-03-19 MED ORDER — HYDRALAZINE HCL 20 MG/ML IJ SOLN
10.0000 mg | Freq: Once | INTRAMUSCULAR | Status: AC
  Administered 2011-03-19: 20 mg via INTRAVENOUS
  Filled 2011-03-19: qty 0.5

## 2011-03-19 MED ORDER — METOPROLOL TARTRATE 25 MG PO TABS
25.0000 mg | ORAL_TABLET | Freq: Two times a day (BID) | ORAL | Status: DC
  Administered 2011-03-20 – 2011-03-21 (×4): 25 mg via ORAL
  Filled 2011-03-19 (×6): qty 1

## 2011-03-19 MED ORDER — NITROGLYCERIN 2 % TD OINT
1.0000 [in_us] | TOPICAL_OINTMENT | Freq: Four times a day (QID) | TRANSDERMAL | Status: DC
  Administered 2011-03-19: 1 [in_us] via TOPICAL
  Filled 2011-03-19: qty 1

## 2011-03-19 NOTE — ED Notes (Signed)
MD at bedside. 

## 2011-03-19 NOTE — Progress Notes (Signed)
ANTICOAGULATION CONSULT NOTE - Initial Consult  Pharmacy Consult for Heparin Indication: chest pain/ACS  No Known Allergies  Patient Measurements: Height: 5\' 11"  (180.3 cm) Weight: 194 lb 3.2 oz (88.089 kg) IBW/kg (Calculated) : 75.3    Vital Signs: Temp: 98.2 F (36.8 C) (11/24 2149) Temp src: Oral (11/24 2149) BP: 127/74 mmHg (11/24 2149) Pulse Rate: 75  (11/24 2149)  Labs:  Basename 03/19/11 1806  HGB 13.2  HCT 38.1*  PLT 141*  APTT 35  LABPROT 12.5  INR 0.91  HEPARINUNFRC --  CREATININE 3.12*  CKTOTAL --  CKMB --  TROPONINI --   Estimated Creatinine Clearance: 31.5 ml/min (by C-G formula based on Cr of 3.12).  Medical History: Past Medical History  Diagnosis Date  . Coronary artery disease   . Hypertension   . CHF (congestive heart failure)   . Diabetes mellitus     Medications:  Prescriptions prior to admission  Medication Sig Dispense Refill  . aspirin 81 MG chewable tablet Chew 324 mg by mouth once.        Marland Kitchen aspirin 81 MG chewable tablet Chew 81 mg by mouth daily.        Marland Kitchen atenolol (TENORMIN) 25 MG tablet Take 25 mg by mouth daily.        Marland Kitchen lisinopril-hydrochlorothiazide (PRINZIDE,ZESTORETIC) 20-12.5 MG per tablet Take 1 tablet by mouth every other day.        . metFORMIN (GLUCOPHAGE) 500 MG tablet Take 500 mg by mouth 2 (two) times daily with a meal.        . nitroGLYCERIN (NITROSTAT) 0.4 MG SL tablet Place 0.4 mg under the tongue once.          Assessment: 45 yo M with extensive cardiac hx presents today with chest pain, positive troponins & EKG changes.  Baseline platelet count 141 but no hx bleeding noted.  Plan for cardiac cath once renal function improves. Heparin bolus & drip started in ED at 2030.   Goal of Therapy:  Heparin level 0.3-0.7 units/ml   Plan:  1) Continue heparin infusion at 1000 units/hr  2) Check 6 hr heparin level at 0230. 3) Daily heparin level & CBC 4) F/U pt progress, renal function & plans for cardiac cath. 5)  Glucose > 400.  Pt on metformin PTA which is being held for renal insufficiency & need for contrast.  Consider add scheduled insulin to keep CBGs<180.  Elson Clan 03/19/2011,10:52 PM

## 2011-03-19 NOTE — ED Provider Notes (Signed)
History     CSN: [id number] Arrival date & time: 03/19/2011  5:22 PM   First MD Initiated Contact with Patient 03/19/11 1738      Chief Complaint  Patient presents with  . Chest Pain   HPI Pt stopped taking his medications a couple of weeks ago.  Pt continues to smoke.  He has history of heart disease.  Pt has history of CABG in 2010.  He has not been seeing a cardiologist since his bypass.  Pt developed chest pain about 1730.   Pt had not been feeling well today.  It was a pulling sensation.  No radiation.  He felt short of breath with it as well as feeling weak.  The pain was  a 4/10.  It has now resolved.  Resting helped with the pain.   The patient denies any pain currently. Past Medical History  Diagnosis Date  . Coronary artery disease   . Hypertension     Past Surgical History  Procedure Date  . Cardiac surgery     History reviewed. No pertinent family history.  History  Substance Use Topics  . Smoking status: Current Everyday Smoker -- 0.5 packs/day    Types: Cigarettes  . Smokeless tobacco: Not on file  . Alcohol Use: No      Review of Systems  All other systems reviewed and are negative.    Allergies  Review of patient's allergies indicates no known allergies.  Home Medications   Current Outpatient Rx  Name Route Sig Dispense Refill  . ASPIRIN 81 MG PO CHEW Oral Chew 324 mg by mouth once.      . ASPIRIN 81 MG PO CHEW Oral Chew 81 mg by mouth daily.      . ATENOLOL 25 MG PO TABS Oral Take 25 mg by mouth daily.      Marland Kitchen LISINOPRIL-HYDROCHLOROTHIAZIDE 20-12.5 MG PO TABS Oral Take 1 tablet by mouth every other day.      Marland Kitchen METFORMIN HCL 500 MG PO TABS Oral Take 500 mg by mouth 2 (two) times daily with a meal.      . NITROGLYCERIN 0.4 MG SL SUBL Sublingual Place 0.4 mg under the tongue once.        BP 156/91  Pulse 104  Temp 98.1 F (36.7 C)  Resp 20  Ht 5\' 11"  (1.803 m)  Wt 191 lb (86.637 kg)  BMI 26.64 kg/m2  SpO2 99%  Physical Exam  Nursing  note and vitals reviewed. Constitutional: He appears well-developed and well-nourished. No distress.  HENT:  Head: Normocephalic and atraumatic.  Right Ear: External ear normal.  Left Ear: External ear normal.  Eyes: Conjunctivae are normal. Right eye exhibits no discharge. Left eye exhibits no discharge. No scleral icterus.  Neck: Neck supple. No tracheal deviation present.  Cardiovascular: Normal rate, regular rhythm and intact distal pulses.   Pulmonary/Chest: Effort normal and breath sounds normal. No stridor. No respiratory distress. He has no wheezes. He has no rales.  Abdominal: Soft. Bowel sounds are normal. He exhibits no distension. There is no tenderness. There is no rebound and no guarding.  Musculoskeletal: He exhibits no edema and no tenderness.  Neurological: He is alert. He has normal strength. No sensory deficit. Cranial nerve deficit:  no gross defecits noted. He exhibits normal muscle tone. He displays no seizure activity. Coordination normal.  Skin: Skin is warm and dry. No rash noted.  Psychiatric: He has a normal mood and affect.    ED Course  Procedures (including critical care time)  Date: 03/19/2011  Rate: 106  Rhythm: sinus tachycardia  QRS Axis: left  Intervals: normal  ST/T Wave abnormalities: normal  Conduction Disutrbances:none  Narrative Interpretation: Left ventricular hypertrophy, inverted T waves in leads 1 and aVL  Old EKG Reviewed: changes noted   Labs Reviewed  CBC - Abnormal; Notable for the following:    HCT 38.1 (*)    Platelets 141 (*)    All other components within normal limits  COMPREHENSIVE METABOLIC PANEL - Abnormal; Notable for the following:    Sodium 134 (*)    Glucose, Bld 401 (*)    BUN 36 (*)    Creatinine, Ser 3.12 (*)    Albumin 3.0 (*)    GFR calc non Af Amer 22 (*)    GFR calc Af Amer 26 (*)    All other components within normal limits  POCT I-STAT TROPONIN I - Abnormal; Notable for the following:    Troponin i, poc  0.10 (*)    All other components within normal limits  PROTIME-INR  APTT  I-STAT TROPONIN I   Dg Chest Portable 1 View  03/19/2011  *RADIOLOGY REPORT*  Clinical Data: Chest pain.  Smoker.  PORTABLE CHEST - 1 VIEW  Comparison: 08/11/2008.  Findings: Post CABG.  Heart size top normal.  Central pulmonary vascular prominence.  No segmental consolidation or gross pneumothorax.  The patient would eventually benefit from follow-up two-view chest with cardiac leads removed.  IMPRESSION: Top normal heart size.  Central pulmonary vascular prominence.  Please see above.  Original Report Authenticated By: Fuller Canada, M.D.    Medications  aspirin 81 MG chewable tablet (not administered)  nitroGLYCERIN (NITROSTAT) 0.4 MG SL tablet (not administered)  metFORMIN (GLUCOPHAGE) 500 MG tablet (not administered)  atenolol (TENORMIN) 25 MG tablet (not administered)  lisinopril-hydrochlorothiazide (PRINZIDE,ZESTORETIC) 20-12.5 MG per tablet (not administered)  aspirin 81 MG chewable tablet (not administered)  aspirin chewable tablet 324 mg (324 mg Oral Not Given 03/19/11 1802)  nitroGLYCERIN (NITROGLYN) 2 % ointment 1 inch (1 inch Topical Given 03/19/11 1807)  heparin 100 units/mL bolus via infusion 4,000 Units (not administered)  heparin ADULT infusion 100 units/mL (25000 units/250 mL) (not administered)  metoprolol tartrate (LOPRESSOR) tablet 25 mg (not administered)  insulin regular (HUMULIN R,NOVOLIN R) 100 units/mL injection 8 Units (not administered)     MDM  The patient remains pain-free here in the emergency department. The patient has known history of coronary artery disease but has not been compliant in the last couple weeks with his diabetes medications or his blood pressure medications. He is hypertensive as well as hyperglycemic here in emergent department. He is pain free now but he does have a slightly elevated troponin and EKG changes. I am concerned about the possibility of recurrent acute  coronary syndrome. I will consult cardiology for further evaluation.  CRITICAL CARE Performed by: Celene Kras   Total critical care time: 30 Critical care time was exclusive of separately billable procedures and treating other patients.  Critical care was necessary to treat or prevent imminent or life-threatening deterioration.  Critical care was time spent personally by me on the following activities: development of treatment plan with patient and/or surrogate as well as nursing, discussions with consultants, evaluation of patient's response to treatment, examination of patient, obtaining history from patient or surrogate, ordering and performing treatments and interventions, ordering and review of laboratory studies, ordering and review of radiographic studies, pulse oximetry and re-evaluation of patient's condition.  Diagnosis: Acute coronary syndrome, hypertension, hyperglycemia     Celene Kras, MD 03/19/11 [id number]

## 2011-03-19 NOTE — H&P (Signed)
[name] is an 45 y.o. male.   Chief Complaint: CP HPI: 47-year-old Caucasian male with history of coronary artery bypass grafting x3 in March of 2010 with LIMA to the distal LAD, SVG to the circumflex marginal branch, SVG to PDA. 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 The patient states that today he was walking through a doorway when the wind caught the door and blew it back catching his foot. He had already stepped off of the first step with his other foot and the door caught him.  He subsequently fell rolling he said he hit his chest on the second step.at that point he says he felt like his heart was stretched. His chest pain was for a 10 in intensity it had decreased to 2/10 currently is pain-free. He reports that he has been out of his medications for the last 2 weeks. He also reports that he has not seen a cardiologist and since his coronary artery bypass grafting. He last saw his primary care doctor approximately 6 months ago. He denies any current chest pain, shortness of breath, dizziness, palpitations, change in vision, radiation into her neck or jaw when he did have the chest pain, or abdominal pain.   Does complain of polyuria. He has not pressure one bowel movement per week.  Past Medical History  Diagnosis Date  . Coronary artery disease   . Hypertension   . CHF (congestive heart failure)   . Diabetes mellitus     Past Surgical History  Procedure Date  . Cardiac surgery   . Coronary artery bypass graft 06/2008    x3    History reviewed. No pertinent family history. Social History:  reports that he has been smoking Cigarettes.  He has a 25 pack-year smoking history. He does not have any smokeless tobacco history on file. He reports  that he uses illicit drugs (Marijuana). He reports that he does not drink alcohol.  Allergies: No Known Allergies  Medications Prior to Admission  Medication Dose Route Frequency Provider Last Rate Last Dose  . aspirin chewable tablet 324 mg  324 mg Oral Once Celene Kras, MD      . heparin 100 units/mL bolus via infusion 4,000 Units  4,000 Units Intravenous Once Celene Kras, MD   4,000 Units at 03/19/11 2034  . heparin ADULT infusion 100 units/mL (25000 units/250 mL)  1,000 Units/hr Intravenous Continuous Celene Kras, MD 10 mL/hr at 03/19/11 2034 1,000 Units/hr at 03/19/11 2034  . hydrALAZINE (APRESOLINE) injection 10 mg  10 mg Intravenous Once Dwana Melena, PA      . insulin aspart (novoLOG) injection 8 Units  8 Units Subcutaneous Once Celene Kras, MD   8 Units at 03/19/11 1945  . metoprolol tartrate (LOPRESSOR) tablet 25 mg  25 mg Oral Once Celene Kras, MD   25 mg at 03/19/11 1916  . nitroGLYCERIN (NITROGLYN) 2 % ointment 1 inch  1 inch Topical Q6H Celene Kras, MD   1 inch at 03/19/11 1807  . DISCONTD: insulin regular (HUMULIN R,NOVOLIN R) 100 units/mL injection 8 Units  8 Units Subcutaneous Once Celene Kras, MD       No current outpatient prescriptions on file as of 03/19/2011.    Results for orders placed during the hospital encounter of 03/19/11 (from the  past 48 hour(s))  CBC     Status: Abnormal   Collection Time   03/19/11  6:06 PM      Component Value Range Comment   WBC 9.4  4.0 - 10.5 (K/uL)    RBC 4.47  4.22 - 5.81 (MIL/uL)    Hemoglobin 13.2  13.0 - 17.0 (g/dL)    HCT 09.6 (*) 04.5 - 52.0 (%)    MCV 85.2  78.0 - 100.0 (fL)    MCH 29.5  26.0 - 34.0 (pg)    MCHC 34.6  30.0 - 36.0 (g/dL)    RDW 40.9  81.1 - 91.4 (%)    Platelets 141 (*) 150 - 400 (K/uL)   COMPREHENSIVE METABOLIC PANEL     Status: Abnormal   Collection Time   03/19/11  6:06 PM      Component Value Range Comment   Sodium 134 (*) 135 - 145 (mEq/L)    Potassium 4.7  3.5 - 5.1 (mEq/L)    Chloride 100  96 -  112 (mEq/L)    CO2 22  19 - 32 (mEq/L)    Glucose, Bld 401 (*) 70 - 99 (mg/dL)    BUN 36 (*) 6 - 23 (mg/dL)    Creatinine, Ser 7.82 (*) 0.50 - 1.35 (mg/dL)    Calcium 9.0  8.4 - 10.5 (mg/dL)    Total Protein 6.2  6.0 - 8.3 (g/dL)    Albumin 3.0 (*) 3.5 - 5.2 (g/dL)    AST 12  0 - 37 (U/L)    ALT 8  0 - 53 (U/L)    Alkaline Phosphatase 95  39 - 117 (U/L)    Total Bilirubin 0.3  0.3 - 1.2 (mg/dL)    GFR calc non Af Amer 22 (*) >90 (mL/min)    GFR calc Af Amer 26 (*) >90 (mL/min)   PROTIME-INR     Status: Normal   Collection Time   03/19/11  6:06 PM      Component Value Range Comment   Prothrombin Time 12.5  11.6 - 15.2 (seconds)    INR 0.91  0.00 - 1.49    APTT     Status: Normal   Collection Time   03/19/11  6:06 PM      Component Value Range Comment   aPTT 35  24 - 37 (seconds)   POCT I-STAT TROPONIN I     Status: Abnormal   Collection Time   03/19/11  6:44 PM      Component Value Range Comment   Troponin i, poc 0.10 (*) 0.00 - 0.08 (ng/mL)    Comment NOTIFIED PHYSICIAN      Comment 3             Dg Chest Portable 1 View  03/19/2011  *RADIOLOGY REPORT*  Clinical Data: Chest pain.  Smoker.  PORTABLE CHEST - 1 VIEW  Comparison: 08/11/2008.  Findings: Post CABG.  Heart size top normal.  Central pulmonary vascular prominence.  No segmental consolidation or gross pneumothorax.  The patient would eventually benefit from follow-up two-view chest with cardiac leads removed.  IMPRESSION: Top normal heart size.  Central pulmonary vascular prominence.  Please see above.  Original Report Authenticated By: Fuller Canada, M.D.    Review of Systems  Constitutional: Negative for diaphoresis.  Eyes: Negative for blurred vision and double vision.  Respiratory: Negative for shortness of breath.   Cardiovascular: Positive for chest pain, claudication and leg swelling. Negative for palpitations.  Occasional LEE    Gastrointestinal: Negative for nausea, vomiting, abdominal pain,  diarrhea, constipation, blood in stool and melena.  Genitourinary: Positive for frequency.       Polyuria  Neurological: Negative for dizziness, weakness and headaches.    Blood pressure 162/76, pulse 87, temperature 98.1 F (36.7 C), resp. rate 18, height 5\' 11"  (1.803 m), weight 86.637 kg (191 lb), SpO2 100.00%. Physical Exam  Constitutional: He is oriented to person, place, and time. He appears well-developed and well-nourished. No distress.  HENT:  Head: Normocephalic and atraumatic.  Eyes: EOM are normal. Pupils are equal, round, and reactive to light. No scleral icterus.  Neck: No thyromegaly present.  Cardiovascular: Normal rate and regular rhythm.   No murmur heard.      2+ radial pulses,  DP and PT pulses decreased.  No LEE.  Bilateral femoral bruits.  Right carotid bruit.  Respiratory: Effort normal. He has wheezes. He has no rales.  GI: Soft. Bowel sounds are normal. There is no tenderness.  Musculoskeletal: He exhibits no edema.  Lymphadenopathy:    He has no cervical adenopathy.  Neurological: He is alert and oriented to person, place, and time.       Strength 5/5 upper and lower extremities. Bilaterally.     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 Start an IV heparin per pharmacy.  Continue nitroglycerin patch. We'll cycle cardiac enzymes x3. Will start a low sodium heart  healthy diet. Also begin sliding scale insulin. Will order 2-D echocardiogram. Recheck EKG in the morning. We'll start Lopressor 25 mg twice a day. We'll gently hydrate with 50 cc of normal saline per hour. We'll start hydralazine 10 mg by mouth three X daily. The Pt also will require at some point lower extremity arterial Dopplers. He may require a renal consult.he also likely needs a left heart catheterization and kidney function is improved.  HAGER,BRYAN W 03/19/2011, 8:36 PM   I have seen and examined the patient along with Wilburt Finlay, PA.  I have reviewed the chart, notes and new data.  I agree with PA's note.the details of his presentation and plan for further care were discussed in detail at the time of admission  Key new complaints: recurrent angina pectoris with progressive angina Key examination changes: ECG abnormalities consistent with ischemia; right carotid bruit; diminished lower extremity pulses Key new findings / data:abnormal cardiac enzymes consistent with non-ST segment elevation myocardial infarction; acute on chronic renal insufficiency due to diabetic/hypertensive nephropathy superimposed dehydration due to uncontrolled hyperglycemia  PLAN: Rehydrated with intravenous saline until renal function parameters for each baseline after which he needs to undergo coronary angiography and very likely will require percutaneous revascularization. He will also require evaluation for peripheral vascular disease with apparent involvement of the carotid and lower extremity arterial meds Thurmon Fair, MD, [facility] [phone]  [name] 03/20/2011, 9:21 AM

## 2011-03-19 NOTE — ED Notes (Signed)
Roselyn Bering, MD handed abnormal lab test results

## 2011-03-19 NOTE — ED Notes (Signed)
168/80 - hr 111 - with EMS - initially 198/148 - Glucose 528. Smokes 1/2 pack a day - weed - denies any other drugs or alcohol.Not allergic to any meds.  CABG x 2 2 yrs ago.

## 2011-03-19 NOTE — ED Notes (Signed)
#  18 right ac - 4 baby asa - one sl nitro by ems - no pain at present.

## 2011-03-20 ENCOUNTER — Other Ambulatory Visit: Payer: Self-pay

## 2011-03-20 LAB — GLUCOSE, CAPILLARY
Glucose-Capillary: 282 mg/dL — ABNORMAL HIGH (ref 70–99)
Glucose-Capillary: 347 mg/dL — ABNORMAL HIGH (ref 70–99)
Glucose-Capillary: 288 mg/dL — ABNORMAL HIGH (ref 70–99)

## 2011-03-20 LAB — BASIC METABOLIC PANEL
Chloride: 102 mEq/L (ref 96–112)
GFR calc Af Amer: 27 mL/min — ABNORMAL LOW (ref >90)
GFR calc non Af Amer: 23 mL/min — ABNORMAL LOW (ref >90)
Potassium: 3.8 mEq/L (ref 3.5–5.1)
Sodium: 135 mEq/L (ref 135–145)

## 2011-03-20 LAB — HEPARIN LEVEL (UNFRACTIONATED): Heparin Unfractionated: >2 IU/mL — ABNORMAL HIGH (ref 0.30–0.70)

## 2011-03-20 LAB — CBC
Hemoglobin: 11.6 g/dL — ABNORMAL LOW (ref 13.0–17.0)
MCH: 28.7 pg (ref 26.0–34.0)
Platelets: 123 10*3/uL — ABNORMAL LOW (ref 150–400)
RBC: 4.04 MIL/uL — ABNORMAL LOW (ref 4.22–5.81)
WBC: 7.2 10*3/uL (ref 4.0–10.5)

## 2011-03-20 LAB — LIPID PANEL
Cholesterol: 232 mg/dL — ABNORMAL HIGH (ref 0–200)
Total CHOL/HDL Ratio: 5.9 RATIO
Triglycerides: 160 mg/dL — ABNORMAL HIGH (ref <150)
VLDL: 32 mg/dL (ref 0–40)

## 2011-03-20 LAB — HEMOGLOBIN A1C
Hgb A1c MFr Bld: 10.5 % — ABNORMAL HIGH (ref <5.7)
Mean Plasma Glucose: 255 mg/dL — ABNORMAL HIGH (ref <117)

## 2011-03-20 LAB — CARDIAC PANEL(CRET KIN+CKTOT+MB+TROPI): Troponin I: 0.6 ng/mL (ref <0.30)

## 2011-03-20 MED ORDER — HEPARIN (PORCINE) IN NACL 100-0.45 UNIT/ML-% IJ SOLN
700.0000 [IU]/h | INTRAMUSCULAR | Status: DC
  Administered 2011-03-20: 700 [IU]/h via INTRAVENOUS

## 2011-03-20 MED ORDER — HEPARIN (PORCINE) IN NACL 100-0.45 UNIT/ML-% IJ SOLN
1900.0000 [IU]/h | INTRAMUSCULAR | Status: DC
  Administered 2011-03-20: 950 [IU]/h via INTRAVENOUS
  Administered 2011-03-21 (×2): 1400 [IU]/h via INTRAVENOUS
  Administered 2011-03-21: 1250 [IU]/h via INTRAVENOUS
  Administered 2011-03-22: 1400 [IU]/h via INTRAVENOUS
  Administered 2011-03-23: 1900 [IU]/h via INTRAVENOUS
  Filled 2011-03-20 (×3): qty 250

## 2011-03-20 MED ORDER — HEPARIN BOLUS VIA INFUSION
3000.0000 [IU] | INTRAVENOUS | Status: AC
  Administered 2011-03-20: 3000 [IU] via INTRAVENOUS
  Filled 2011-03-20: qty 3000

## 2011-03-20 MED ORDER — ASPIRIN EC 325 MG PO TBEC
325.0000 mg | DELAYED_RELEASE_TABLET | Freq: Every day | ORAL | Status: DC
  Administered 2011-03-22 – 2011-03-23 (×2): 325 mg via ORAL
  Filled 2011-03-20 (×2): qty 1

## 2011-03-20 MED ORDER — ASPIRIN 81 MG PO CHEW
324.0000 mg | CHEWABLE_TABLET | ORAL | Status: AC
  Administered 2011-03-21: 324 mg via ORAL
  Filled 2011-03-20: qty 4

## 2011-03-20 NOTE — Progress Notes (Signed)
*  PRELIMINARY RESULTS* Echocardiogram 2D Echocardiogram has been performed.  Clide Deutscher RDCS 03/20/2011, 11:34 AM

## 2011-03-20 NOTE — Progress Notes (Signed)
Patient ID: [name], male   DOB: [birth year], 45 y.o.   MRN: [id number]  [facility]  Subjective: No CP or SOB  Objective: Vital signs in last 24 hours: Temp:  [97.8 F (36.6 C)-98.2 F (36.8 C)] 97.8 F (36.6 C) (11/25 0450) Pulse Rate:  [67-104] 67  (11/25 0450) Resp:  [18-20] 18  (11/25 0450) BP: (101-166)/(61-91) 101/63 mmHg (11/25 0450) SpO2:  [98 %-100 %] 99 % (11/25 0752) Weight:  [86.637 kg (191 lb)-88.8 kg (195 lb 12.3 oz)] 195 lb 12.3 oz (88.8 kg) (11/25 0450) Last BM Date: 03/18/11  Intake/Output from previous day: 11/24 0701 - 11/25 0700 In: 964 [P.O.:480; I.V.:484] Out: 600 [Urine:600] Intake/Output this shift:    Medications Current Facility-Administered Medications  Medication Dose Route Frequency Provider Last Rate Last Dose  . 0.9 %  sodium chloride infusion   Intravenous Continuous Dwana Melena, PA 50 mL/hr at 03/20/11 0700    . acetaminophen (TYLENOL) tablet 650 mg  650 mg Oral Q4H PRN Dwana Melena, PA   650 mg at 03/19/11 2356  . aspirin EC tablet 325 mg  325 mg Oral Daily Dwana Melena, PA      . heparin 100 units/mL bolus via infusion 4,000 Units  4,000 Units Intravenous Once Celene Kras, MD   4,000 Units at 03/19/11 2034  . heparin ADULT infusion 100 units/mL (25000 units/250 mL)  700 Units/hr Intravenous Continuous Colleen Can, PHARMD 7 mL/hr at 03/20/11 4098 700 Units/hr at 03/20/11 1191  . hydrALAZINE (APRESOLINE) injection 10 mg  10 mg Intravenous Once Dwana Melena, PA   20 mg at 03/19/11 2107  . hydrALAZINE (APRESOLINE) tablet 25 mg  25 mg Oral Q8H Dwana Melena, [place]   25 mg at 03/20/11 4782  . insulin aspart (novoLOG) injection 0-15 Units  0-15 Units Subcutaneous TID WC Dwana Melena, PA   8 Units at 03/20/11 [phone]  . insulin aspart (novoLOG) injection 8 Units  8 Units Subcutaneous Once Celene Kras, MD   8 Units at 03/19/11 1945  . levalbuterol (XOPENEX) nebulizer solution 0.63 mg  0.63 mg Nebulization TID  Dwana Melena, PA   0.63 mg at 03/20/11 0750  . metoprolol tartrate (LOPRESSOR) tablet 25 mg  25 mg Oral Once Celene Kras, MD   25 mg at 03/19/11 1916  . metoprolol tartrate (LOPRESSOR) tablet 25 mg  25 mg Oral BID Dwana Melena, PA      . nicotine (NICODERM CQ - dosed in mg/24 hours) patch 21 mg  21 mg Transdermal Daily Dwana Melena, PA   21 mg at 03/19/11 2343  . nitroGLYCERIN (NITROSTAT) SL tablet 0.4 mg  0.4 mg Sublingual Q5 min PRN Dwana Melena, PA      . nitroGLYCERIN 0.2 mg/mL in dextrose 5 % infusion  10 mcg/min Intravenous Titrated Dwana Melena, PA 3 mL/hr at 03/20/11 0700 10 mcg/min at 03/20/11 0700  . ondansetron (ZOFRAN) injection 4 mg  4 mg Intravenous Q6H PRN Dwana Melena, PA      . rosuvastatin (CRESTOR) tablet 10 mg  10 mg Oral Daily Dwana Melena, [place]      . DISCONTD: aspirin chewable tablet 324 mg  324 mg Oral Once Celene Kras, MD      . DISCONTD: heparin ADULT infusion 100 units/mL (25000 units/250 mL)  1,000 Units/hr Intravenous Continuous Celene Kras, MD 10 mL/hr at 03/19/11 2034 1,000 Units/hr at 03/19/11  2034  . DISCONTD: insulin regular (HUMULIN R,NOVOLIN R) 100 units/mL injection 8 Units  8 Units Subcutaneous Once Celene Kras, MD      . DISCONTD: nitroGLYCERIN (NITROGLYN) 2 % ointment 1 inch  1 inch Topical Q6H Celene Kras, MD   1 inch at 03/19/11 1807    PE: General appearance: alert, cooperative and no distress Neck: no JVD Lungs: clear to auscultation bilaterally Heart: regular rate and rhythm Extremities: no LEE Pulses: decreased pedal pulses. 2+ radials.  Lab Results:   [facility] 03/20/11 0700 03/19/11 1806  WBC 7.2 9.4  HGB 11.6* 13.2  HCT 34.2* 38.1*  PLT 123* 141*   BMET  Basename 03/20/11 0230 03/19/11 1806  NA 135 134*  K 3.8 4.7  CL 102 100  CO2 19 22  GLUCOSE 201* 401*  BUN 36* 36*  CREATININE 3.02* 3.12*  CALCIUM 9.0 9.0   PT/INR  Basename 03/19/11 1806  LABPROT 12.5  INR 0.91   Cholesterol  Basename 03/20/11 0700  CHOL 232*    TG: 160, HDL 39, Ration 5.9, VLDL 32, LDL 161  Cardiac Enzymes  Troponin 0.91  Studies/Results:    Assessment/Plan  Principal Problem:  *NSTEMI (non-ST elevated myocardial infarction) Active Problems:  S/P CABG x 3:  March 2010: left internal mammary artery to distal left anterior descending, SVG to Circ marginal vessel, SVG to PDA  HTN (hypertension)  Hyperlipidemia  DM2 (diabetes mellitus, type 2)  CHF (congestive heart failure): Systolic/ diastolic  Chronic renal insufficiency  Tobacco abuse  Marijuana abuse  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  Will schedule for tomorrow pending Cr.  Holding ACE and HCTZ. Gently hydrating.  Will increase fluids to 100cc/hr.  Needs renal consult.  2D echo pending.  Poorly controlled DM but improved. Diabetes education, Nutrition consult.  Tobacco cessation consult ordered.  AM labs.    LOS: 1 day    HAGER,BRYAN W 03/20/2011 8:17 AM  I have seen and examined the patient along with Wilburt Finlay, PA.  I have reviewed the chart, notes and new data.  I agree with PA/NP's note.  Key new complaints:no complaints of angina to Key examination changes: none; no evidence of hypervolemia/congestive heart failure Key new findings / data: troponin has increased to 0.99; lateral T wave inversion persists and maybe even worse compared to yesterday.  PLAN: Continue intravenous hydration. His renal function is still not satisfactory for contrast exposure. Tentatively scheduled for coronary angiography tomorrow. If he does require revascularization this may have to be per performed as a staged procedure to avoid the risk of contrast nephrotoxicity. The risks and benefits of the procedure including the potential for worsening renal function even progression to renal failure were discussed in detail with the patient. He understands that and agrees to  proceed.  He tolerates intravenous fluids well without evidence of congestive heart failure. His most recent documented left ventricular ejection fraction was only 10%. That evaluation was performed prior to his bypass surgery and it is likely that the ventricle systolic function has improved since.  We'll also recommend a nutritional evaluation and consultation for diabetic diet. He requires social work support for his Medicaid application.  Christyann Manolis 03/20/2011, 9:25 AM

## 2011-03-20 NOTE — Progress Notes (Signed)
ANTICOAGULATION CONSULT NOTE - Follow Up Consult  Pharmacy Consult for heparin Indication: chest pain/ACS  No Known Allergies  Patient Measurements: Height: 5\' 11"  (180.3 cm) Weight: 195 lb 12.3 oz (88.8 kg) IBW/kg (Calculated) : 75.3   Vital Signs: Temp: 97.8 F (36.6 C) (11/25 0450) Temp src: Oral (11/25 0450) BP: 101/63 mmHg (11/25 0450) Pulse Rate: 67  (11/25 0450)  Labs:  Basename 03/20/11 0230 03/19/11 2218 03/19/11 1806  HGB -- -- 13.2  HCT -- -- 38.1*  PLT -- -- 141*  APTT -- -- 35  LABPROT -- -- 12.5  INR -- -- 0.91  HEPARINUNFRC >2.00* -- --  CREATININE 3.02* -- 3.12*  CKTOTAL -- 143 --  CKMB -- 6.4* --  TROPONINI -- 0.91* --   Estimated Creatinine Clearance: 32.6 ml/min (by C-G formula based on Cr of 3.02).  Medications:  Scheduled:    . aspirin EC  325 mg Oral Daily  . heparin  4,000 Units Intravenous Once  . hydrALAZINE  10 mg Intravenous Once  . hydrALAZINE  25 mg Oral Q8H  . insulin aspart  0-15 Units Subcutaneous TID WC  . insulin aspart  8 Units Subcutaneous Once  . levalbuterol  0.63 mg Nebulization TID  . metoprolol tartrate  25 mg Oral Once  . metoprolol tartrate  25 mg Oral BID  . nicotine  21 mg Transdermal Daily  . rosuvastatin  10 mg Oral Daily  . DISCONTD: aspirin  324 mg Oral Once  . DISCONTD: insulin regular  8 Units Subcutaneous Once  . DISCONTD: nitroGLYCERIN  1 inch Topical Q6H   Infusions:    . sodium chloride 50 mL/hr at 03/19/11 2342  . heparin    . nitroGLYCERIN 10 mcg/min (03/19/11 2343)  . DISCONTD: heparin 1,000 Units/hr (03/19/11 2034)   Assessment: 45yo male supratherapeutic on heparin with initial dosing for ACS; verified that lab was drawn correctly with RN.  Goal of Therapy:  Heparin level 0.3-0.7 units/ml   Plan:  Will hold gtt x2hr then resume at rate decreased by 4 units/kg/hr to 700 units/hr and check level in 8hr.  Colleen Can PharmD BCPS 03/20/2011,5:05 AM

## 2011-03-20 NOTE — Progress Notes (Signed)
Pt Nitroglycerin drip administered via IV at 10 mcg.  Patient resting, complains of midback pain on scale of 1-10, patient rates pain 2/10.VSS, will continue to monitor patient

## 2011-03-20 NOTE — Progress Notes (Signed)
ANTICOAGULATION CONSULT NOTE - Follow Up Consult  Pharmacy Consult for heparin Indication: chest pain/ACS  No Known Allergies  Patient Measurements: Height: 5\' 11"  (180.3 cm) Weight: 195 lb 12.3 oz (88.8 kg) IBW/kg (Calculated) : 75.3   Vital Signs: Temp: 98.1 F (36.7 C) (11/25 1428) Temp src: Oral (11/25 1428) BP: 127/71 mmHg (11/25 1428) Pulse Rate: 64  (11/25 1428)  Labs:  Basename 03/20/11 1833 03/20/11 0700 03/20/11 0230 03/19/11 2218 03/19/11 1806  HGB -- 11.6* -- -- 13.2  HCT -- 34.2* -- -- 38.1*  PLT -- 123* -- -- 141*  APTT -- -- -- -- 35  LABPROT -- -- -- -- 12.5  INR -- -- -- -- 0.91  HEPARINUNFRC <0.10* -- >2.00* -- --  CREATININE -- -- 3.02* -- 3.12*  CKTOTAL -- 119 -- 143 --  CKMB -- 6.0* -- 6.4* --  TROPONINI -- 0.60* -- 0.91* --   Estimated Creatinine Clearance: 32.6 ml/min (by C-G formula based on Cr of 3.02).  Medications:  Scheduled:     . aspirin  324 mg Oral Pre-Cath  . aspirin EC  325 mg Oral Daily  . heparin  4,000 Units Intravenous Once  . hydrALAZINE  10 mg Intravenous Once  . hydrALAZINE  25 mg Oral Q8H  . insulin aspart  0-15 Units Subcutaneous TID WC  . levalbuterol  0.63 mg Nebulization TID  . metoprolol tartrate  25 mg Oral BID  . nicotine  21 mg Transdermal Daily  . rosuvastatin  10 mg Oral Daily  . DISCONTD: aspirin  324 mg Oral Once  . DISCONTD: aspirin EC  325 mg Oral Daily  . DISCONTD: nitroGLYCERIN  1 inch Topical Q6H   Infusions:     . sodium chloride 100 mL/hr at 03/20/11 1401  . heparin 700 Units/hr (03/20/11 4098)  . nitroGLYCERIN 10 mcg/min (03/20/11 0700)  . DISCONTD: heparin 1,000 Units/hr (03/19/11 2034)   Assessment: 45yo male on heparin  for ACS. Heparin rate decreased this morning due to HL >2.   Now heparin level reported as <0.1 unit/ml on 700 units/hr  (resumed after a 2 hour HOLD).  Nurse confirms heparin infusing at 700 units/hr. No Bleeding reported. Pt. Going to cath lab in am.   Goal of Therapy:    Heparin level 0.3-0.7 units/ml   Plan:  Give Heparin bolus 3000 units IV x 1 and increase heparin drip to 950 units/hr.  Check heparin level in ~6 hours.   Arman Filter PharmD BCPS 03/20/2011,7:54 PM

## 2011-03-20 NOTE — Progress Notes (Signed)
Received phone call, patient CKMB 6.1,Troponin 0.9.  PA Josie Saunders notified, orders received, will continue to monitor patient.

## 2011-03-21 ENCOUNTER — Other Ambulatory Visit: Payer: Self-pay

## 2011-03-21 LAB — GLUCOSE, CAPILLARY
Glucose-Capillary: 302 mg/dL — ABNORMAL HIGH (ref 70–99)
Glucose-Capillary: 385 mg/dL — ABNORMAL HIGH (ref 70–99)
Glucose-Capillary: 170 mg/dL — ABNORMAL HIGH (ref 70–99)

## 2011-03-21 LAB — CBC
HCT: 31.8 % — ABNORMAL LOW (ref 39.0–52.0)
MCHC: 34.6 g/dL (ref 30.0–36.0)
MCV: 86.2 fL (ref 78.0–100.0)
Platelets: 109 10*3/uL — ABNORMAL LOW (ref 150–400)
RDW: 13.2 % (ref 11.5–15.5)

## 2011-03-21 LAB — BASIC METABOLIC PANEL
BUN: 35 mg/dL — ABNORMAL HIGH (ref 6–23)
CO2: 22 mEq/L (ref 19–32)
Calcium: 7.8 mg/dL — ABNORMAL LOW (ref 8.4–10.5)
Chloride: 106 mEq/L (ref 96–112)
Creatinine, Ser: 2.81 mg/dL — ABNORMAL HIGH (ref 0.50–1.35)

## 2011-03-21 LAB — HEPARIN LEVEL (UNFRACTIONATED)
Heparin Unfractionated: 0.17 IU/mL — ABNORMAL LOW (ref 0.30–0.70)
Heparin Unfractionated: 0.25 IU/mL — ABNORMAL LOW (ref 0.30–0.70)

## 2011-03-21 MED ORDER — INSULIN GLARGINE 100 UNIT/ML ~~LOC~~ SOLN
10.0000 [IU] | SUBCUTANEOUS | Status: DC
  Administered 2011-03-21 – 2011-03-22 (×2): 10 [IU] via SUBCUTANEOUS
  Filled 2011-03-21: qty 3

## 2011-03-21 NOTE — Consult Note (Signed)
[facility] Renal Consultation Note  Requesting MD: Croitoru Indication for Consultation: A on CRF  HPI:  [name] is a 45 y.o. male with PMhx of DM, HTN (both poorly controlled), hyperlipidemia,  CAD s/p CABG in March of 2010 with documented EF of 10% at that time.  He also has PMHX of tobacco and polysubstance abuse, medical noncompliance and has not seen a cardiologist since his CABG and admits that he has not been taking meds.  He presents after a fall and chest pain and was noted to have positive cardiac enzymes.  Regarding renal function, he had creatinines in the high 1's early in 2010.  After CABG, crt had been around 2-2.2.  This admit, initial creatinine was 3.12 but has improved to 2.81 with IVF.  He was supposedly on lisinopril prior to admit.  Patient has been told the recommendations of cardiac cath and possible ICD but tells me that he wants time to straighten himself out (a couple months) he is not willing to undergo these procedures due to the risk of worsening kidney function.  In addition, he states that he would not do HD if we said he needed it.    He denies NSAIDS, history of kidney stones, UTIs or any issues with UOP.    PMHx:   Past Medical History  Diagnosis Date  . Coronary artery disease   . Hypertension   . CHF (congestive heart failure)   . Diabetes mellitus     Past Surgical History  Procedure Date  . Cardiac surgery   . Coronary artery bypass graft 06/2008    x3    Family Hx: History reviewed. No pertinent family history.  Social History:  reports that he has been smoking Cigarettes.  He has a 25 pack-year smoking history. He does not have any smokeless tobacco history on file. He reports that he uses illicit drugs (Marijuana). He reports that he does not drink alcohol.  Allergies: No Known Allergies  Medications: Prior to Admission medications   Medication Sig Start Date End Date Taking? Authorizing Provider  aspirin 81 MG  chewable tablet Chew 324 mg by mouth once.     Yes Historical Provider, MD  aspirin 81 MG chewable tablet Chew 81 mg by mouth daily.     Yes Historical Provider, MD  atenolol (TENORMIN) 25 MG tablet Take 25 mg by mouth daily.     Yes Historical Provider, MD  lisinopril-hydrochlorothiazide (PRINZIDE,ZESTORETIC) 20-12.5 MG per tablet Take 1 tablet by mouth every other day.     Yes Historical Provider, MD  metFORMIN (GLUCOPHAGE) 500 MG tablet Take 500 mg by mouth 2 (two) times daily with a meal.     Yes Historical Provider, MD  nitroGLYCERIN (NITROSTAT) 0.4 MG SL tablet Place 0.4 mg under the tongue once.     Yes Historical Provider, MD    I have reviewed the patient's current medications.  Labs:  Results for orders placed during the hospital encounter of 03/19/11 (from the past 48 hour(s))  CBC     Status: Abnormal   Collection Time   03/19/11  6:06 PM      Component Value Range Comment   WBC 9.4  4.0 - 10.5 (K/uL)    RBC 4.47  4.22 - 5.81 (MIL/uL)    Hemoglobin 13.2  13.0 - 17.0 (g/dL)    HCT 40.9 (*) 81.1 - 52.0 (%)    MCV 85.2  78.0 - 100.0 (fL)    MCH 29.5  26.0 -  34.0 (pg)    MCHC 34.6  30.0 - 36.0 (g/dL)    RDW 16.1  09.6 - 04.5 (%)    Platelets 141 (*) 150 - 400 (K/uL)   COMPREHENSIVE METABOLIC PANEL     Status: Abnormal   Collection Time   03/19/11  6:06 PM      Component Value Range Comment   Sodium 134 (*) 135 - 145 (mEq/L)    Potassium 4.7  3.5 - 5.1 (mEq/L)    Chloride 100  96 - 112 (mEq/L)    CO2 22  19 - 32 (mEq/L)    Glucose, Bld 401 (*) 70 - 99 (mg/dL)    BUN 36 (*) 6 - 23 (mg/dL)    Creatinine, Ser 4.09 (*) 0.50 - 1.35 (mg/dL)    Calcium 9.0  8.4 - 10.5 (mg/dL)    Total Protein 6.2  6.0 - 8.3 (g/dL)    Albumin 3.0 (*) 3.5 - 5.2 (g/dL)    AST 12  0 - 37 (U/L)    ALT 8  0 - 53 (U/L)    Alkaline Phosphatase 95  39 - 117 (U/L)    Total Bilirubin 0.3  0.3 - 1.2 (mg/dL)    GFR calc non Af Amer 22 (*) >90 (mL/min)    GFR calc Af Amer 26 (*) >90 (mL/min)     PROTIME-INR     Status: Normal   Collection Time   03/19/11  6:06 PM      Component Value Range Comment   Prothrombin Time 12.5  11.6 - 15.2 (seconds)    INR 0.91  0.00 - 1.49    APTT     Status: Normal   Collection Time   03/19/11  6:06 PM      Component Value Range Comment   aPTT 35  24 - 37 (seconds)   POCT I-STAT TROPONIN I     Status: Abnormal   Collection Time   03/19/11  6:44 PM      Component Value Range Comment   Troponin i, poc 0.10 (*) 0.00 - 0.08 (ng/mL)    Comment NOTIFIED PHYSICIAN      Comment 3            GLUCOSE, CAPILLARY     Status: Abnormal   Collection Time   03/19/11  9:51 PM      Component Value Range Comment   Glucose-Capillary 230 (*) 70 - 99 (mg/dL)   CARDIAC PANEL(CRET KIN+CKTOT+MB+TROPI)     Status: Abnormal   Collection Time   03/19/11 10:18 PM      Component Value Range Comment   Total CK 143  7 - 232 (U/L)    CK, MB 6.4 (*) 0.3 - 4.0 (ng/mL)    Troponin I 0.91 (*) <0.30 (ng/mL)    Relative Index 4.5 (*) 0.0 - 2.5    TSH     Status: Normal   Collection Time   03/19/11 10:18 PM      Component Value Range Comment   TSH 1.222  0.350 - 4.500 (uIU/mL)   MAGNESIUM     Status: Normal   Collection Time   03/19/11 10:18 PM      Component Value Range Comment   Magnesium 2.1  1.5 - 2.5 (mg/dL)   HEMOGLOBIN W1X     Status: Abnormal   Collection Time   03/19/11 10:18 PM      Component Value Range Comment   Hemoglobin A1C 10.5 (*) <5.7 (%)    Mean  Plasma Glucose 255 (*) <117 (mg/dL)   BASIC METABOLIC PANEL     Status: Abnormal   Collection Time   03/20/11  2:30 AM      Component Value Range Comment   Sodium 135  135 - 145 (mEq/L)    Potassium 3.8  3.5 - 5.1 (mEq/L)    Chloride 102  96 - 112 (mEq/L)    CO2 19  19 - 32 (mEq/L)    Glucose, Bld 201 (*) 70 - 99 (mg/dL)    BUN 36 (*) 6 - 23 (mg/dL)    Creatinine, Ser 1.61 (*) 0.50 - 1.35 (mg/dL)    Calcium 9.0  8.4 - 10.5 (mg/dL)    GFR calc non Af Amer 23 (*) >90 (mL/min)    GFR calc Af Amer 27  (*) >90 (mL/min)   HEPARIN LEVEL (UNFRACTIONATED)     Status: Abnormal   Collection Time   03/20/11  2:30 AM      Component Value Range Comment   Heparin Unfractionated >2.00 (*) 0.30 - 0.70 (IU/mL)   GLUCOSE, CAPILLARY     Status: Abnormal   Collection Time   03/20/11  6:26 AM      Component Value Range Comment   Glucose-Capillary 282 (*) 70 - 99 (mg/dL)   CARDIAC PANEL(CRET KIN+CKTOT+MB+TROPI)     Status: Abnormal   Collection Time   03/20/11  7:00 AM      Component Value Range Comment   Total CK 119  7 - 232 (U/L)    CK, MB 6.0 (*) 0.3 - 4.0 (ng/mL)    Troponin I 0.60 (*) <0.30 (ng/mL)    Relative Index 5.0 (*) 0.0 - 2.5    CBC     Status: Abnormal   Collection Time   03/20/11  7:00 AM      Component Value Range Comment   WBC 7.2  4.0 - 10.5 (K/uL)    RBC 4.04 (*) 4.22 - 5.81 (MIL/uL)    Hemoglobin 11.6 (*) 13.0 - 17.0 (g/dL)    HCT 09.6 (*) 04.5 - 52.0 (%)    MCV 84.7  78.0 - 100.0 (fL)    MCH 28.7  26.0 - 34.0 (pg)    MCHC 33.9  30.0 - 36.0 (g/dL)    RDW 40.9  81.1 - 91.4 (%)    Platelets 123 (*) 150 - 400 (K/uL)   LIPID PANEL     Status: Abnormal   Collection Time   03/20/11  7:00 AM      Component Value Range Comment   Cholesterol 232 (*) 0 - 200 (mg/dL)    Triglycerides 782 (*) <150 (mg/dL)    HDL 39 (*) >95 (mg/dL)    Total CHOL/HDL Ratio 5.9      VLDL 32  0 - 40 (mg/dL)    LDL Cholesterol 621 (*) 0 - 99 (mg/dL)   GLUCOSE, CAPILLARY     Status: Abnormal   Collection Time   03/20/11 11:27 AM      Component Value Range Comment   Glucose-Capillary 347 (*) 70 - 99 (mg/dL)    Comment 1 Documented in Chart      Comment 2 Notify RN     GLUCOSE, CAPILLARY     Status: Abnormal   Collection Time   03/20/11  4:22 PM      Component Value Range Comment   Glucose-Capillary 288 (*) 70 - 99 (mg/dL)    Comment 1 Documented in Chart      Comment 2  Notify RN     HEPARIN LEVEL (UNFRACTIONATED)     Status: Abnormal   Collection Time   03/20/11  6:33 PM      Component  Value Range Comment   Heparin Unfractionated <0.10 (*) 0.30 - 0.70 (IU/mL)   GLUCOSE, CAPILLARY     Status: Abnormal   Collection Time   03/20/11  8:11 PM      Component Value Range Comment   Glucose-Capillary 218 (*) 70 - 99 (mg/dL)   HEPARIN LEVEL (UNFRACTIONATED)     Status: Abnormal   Collection Time   03/21/11  1:58 AM      Component Value Range Comment   Heparin Unfractionated 0.17 (*) 0.30 - 0.70 (IU/mL)   BASIC METABOLIC PANEL     Status: Abnormal   Collection Time   03/21/11  1:58 AM      Component Value Range Comment   Sodium 136  135 - 145 (mEq/L)    Potassium 3.9  3.5 - 5.1 (mEq/L)    Chloride 106  96 - 112 (mEq/L)    CO2 22  19 - 32 (mEq/L)    Glucose, Bld 314 (*) 70 - 99 (mg/dL)    BUN 35 (*) 6 - 23 (mg/dL)    Creatinine, Ser 4.09 (*) 0.50 - 1.35 (mg/dL)    Calcium 7.8 (*) 8.4 - 10.5 (mg/dL)    GFR calc non Af Amer 25 (*) >90 (mL/min)    GFR calc Af Amer 29 (*) >90 (mL/min)   CBC     Status: Abnormal   Collection Time   03/21/11  1:58 AM      Component Value Range Comment   WBC 6.5  4.0 - 10.5 (K/uL)    RBC 3.69 (*) 4.22 - 5.81 (MIL/uL)    Hemoglobin 11.0 (*) 13.0 - 17.0 (g/dL)    HCT 81.1 (*) 91.4 - 52.0 (%)    MCV 86.2  78.0 - 100.0 (fL)    MCH 29.8  26.0 - 34.0 (pg)    MCHC 34.6  30.0 - 36.0 (g/dL)    RDW 78.2  95.6 - 21.3 (%)    Platelets 109 (*) 150 - 400 (K/uL) PLATELET COUNT CONFIRMED BY SMEAR  GLUCOSE, CAPILLARY     Status: Abnormal   Collection Time   03/21/11  6:06 AM      Component Value Range Comment   Glucose-Capillary 302 (*) 70 - 99 (mg/dL)    Comment 1 Notify RN     MRSA PCR SCREENING     Status: Normal   Collection Time   03/21/11  8:03 AM      Component Value Range Comment   MRSA by PCR NEGATIVE  NEGATIVE    GLUCOSE, CAPILLARY     Status: Abnormal   Collection Time   03/21/11 11:37 AM      Component Value Range Comment   Glucose-Capillary 385 (*) 70 - 99 (mg/dL)    Comment 1 Documented in Chart      Comment 2 Notify RN       HEPARIN LEVEL (UNFRACTIONATED)     Status: Abnormal   Collection Time   03/21/11 11:59 AM      Component Value Range Comment   Heparin Unfractionated 0.25 (*) 0.30 - 0.70 (IU/mL)   GLUCOSE, CAPILLARY     Status: Abnormal   Collection Time   03/21/11  4:18 PM      Component Value Range Comment   Glucose-Capillary 170 (*) 70 - 99 (mg/dL)  Comment 1 Notify RN      Comment 2 Documented in Chart        ROS:  Pertinent items are noted in HPI. He denies worsening SOB, DOE, LE edema, CP, cough.  He denies hematuria, dysuria, does have foaminess to urine.  HE denies abdominal pain, N/V/D/C   Physical Exam: Filed Vitals:   03/21/11 1411  BP: 142/80  Pulse: 64  Temp: 98.1 F (36.7 C)  Resp: 18     General: looks older than stated age, slightly dissheveled HEENT: PERRLA Neck: no JVD, no LAD Heart: RRR, no M/G./r, displaced PMI Lungs: CTAB, no wheezes Abdomen: soft, NT Extremities: no edema    Assessment/Plan:45 year old WM with HTN, DM, noncompliance, CAD with decreased EF and also CKD.  He presents with A on CRF in setting of CP/ positive cardiac enzymes.  1.Renal- A on CRF may have been due to medication noncompliance and HTN urgency and well as possible cardiac event. Renal function is improving with current measures, which is just basically controlling BP and sugar.  I do not think he is currently wet or dry, I agree with no IVF and no lasix at this time.  I did explain to him that he would be at risk for contrast nephropathy as a result of cardiac cath due to risk factors.  I cannot completely quantify this risk but it is high.  Pt basically indicates that he has already decided to refuse the procedure based on this.  If he did wish to proceed I would recommend the usual precautions of IVF and mucomist.   2. Hypertension/volume  - Agree with medication choices. Would titrate up current agents to get him off of NTG gtt.   Avoid Ace or ARB right now given renal insufficiency 4.  Anemia  - Not an issue right now.   Will follow with you.   [name] A 03/21/2011, 5:32 PM

## 2011-03-21 NOTE — Progress Notes (Signed)
Inpatient Diabetes Program Recommendations  AACE/ADA: New Consensus Statement on Inpatient Glycemic Control (2009)  Target Ranges:  Prepandial:   less than 140 mg/dL      Peak postprandial:   less than 180 mg/dL (1-2 hours)      Critically ill patients:  140 - 180 mg/dL   Reason for Visit: Elevated A1C: 10.5%  Inpatient Diabetes Program Recommendations Outpatient Referral: Please order outpatient Diabetes Education  Note: Please consult case management regarding medication assistance.  Also if patient to be discharged home on insulin, order insulin administration teaching.

## 2011-03-21 NOTE — Progress Notes (Addendum)
Patient ID: [name], male   DOB: [birth year], 45 y.o.   MRN: [id number]  [facility]  Subjective: A little SOB this morning.no angina  Objective: Vital signs in last 24 hours: Temp:  [97.9 F (36.6 C)-98.4 F (36.9 C)] 97.9 F (36.6 C) (11/26 8413) Pulse Rate:  [64-72] 71  (11/26 0632) Resp:  [18] 18  (11/26 2440) BP: (127-157)/(69-75) 136/75 mmHg (11/26 0632) SpO2:  [98 %-99 %] 98 % (11/26 0632) Last BM Date: 03/18/11  Intake/Output from previous day: 11/25 0701 - 11/26 0700 In: 480 [P.O.:480] Out: 1925 [Urine:1925] Intake/Output this shift:    Medications Current Facility-Administered Medications  Medication Dose Route Frequency Provider Last Rate Last Dose  . 0.9 %  sodium chloride infusion   Intravenous Continuous Dwana Melena, PA 100 mL/hr at 03/20/11 1401    . acetaminophen (TYLENOL) tablet 650 mg  650 mg Oral Q4H PRN Dwana Melena, PA   650 mg at 03/19/11 2356  . aspirin chewable tablet 324 mg  324 mg Oral Pre-Cath Kelle Darting [place], [place]   324 mg at 03/21/11 1027  . aspirin EC tablet 325 mg  325 mg Oral Daily Severiano Gilbert, [place]      . heparin 100 units/mL bolus via infusion 3,000 Units  3,000 Units Intravenous NOW Sai Zinn   3,000 Units at 03/20/11 2026  . heparin ADULT infusion 100 units/mL (25000 units/250 mL)  1,250 Units/hr Intravenous Continuous Janice Coffin, RPH 12.5 mL/hr at 03/21/11 0609 1,250 Units/hr at 03/21/11 0609  . hydrALAZINE (APRESOLINE) tablet 25 mg  25 mg Oral Q8H Dwana Melena, [place]   25 mg at 03/21/11 2536  . insulin aspart (novoLOG) injection 0-15 Units  0-15 Units Subcutaneous TID [facility] Dwana Melena, PA   11 Units at 03/21/11 [id number]  . levalbuterol (XOPENEX) nebulizer solution 0.63 mg  0.63 mg Nebulization TID Dwana Melena, PA   0.63 mg at 03/20/11 2018  . metoprolol tartrate (LOPRESSOR) tablet 25 mg  25 mg Oral BID Dwana Melena, PA   25 mg at 03/20/11 2105  . nicotine (NICODERM CQ - dosed in mg/24  hours) patch 21 mg  21 mg Transdermal Daily Dwana Melena, PA   21 mg at 03/20/11 1030  . nitroGLYCERIN (NITROSTAT) SL tablet 0.4 mg  0.4 mg Sublingual Q5 min PRN Dwana Melena, PA      . nitroGLYCERIN 0.2 mg/mL in dextrose 5 % infusion  10 mcg/min Intravenous Titrated Dwana Melena, PA 3 mL/hr at 03/20/11 0700 10 mcg/min at 03/20/11 0700  . ondansetron (ZOFRAN) injection 4 mg  4 mg Intravenous Q6H PRN Dwana Melena, PA      . rosuvastatin (CRESTOR) tablet 10 mg  10 mg Oral Daily Dwana Melena, PA   10 mg at 03/20/11 1052  . DISCONTD: aspirin EC tablet 325 mg  325 mg Oral Daily Dwana Melena, PA   325 mg at 03/20/11 1052  . DISCONTD: heparin ADULT infusion 100 units/mL (25000 units/250 mL)  700 Units/hr Intravenous Continuous Colleen Can, PHARMD 7 mL/hr at 03/20/11 3474 700 Units/hr at 03/20/11 2595    PE: General appearance: alert, cooperative and no distress Lungs: clear to auscultation bilaterally Heart: regular rate and rhythm, S1, S2 normal, no murmur, click, rub or gallop Extremities: No Edema  Lab Results:   Basename 03/21/11 0158 03/20/11 0700 03/19/11 1806  WBC 6.5 7.2 9.4  HGB 11.0* 11.6* 13.2  HCT 31.8*  34.2* 38.1*  PLT 109* 123* 141*   BMET  Basename 03/21/11 0158 03/20/11 0230 03/19/11 1806  NA 136 135 134*  K 3.9 3.8 4.7  CL 106 102 100  CO2 22 19 22   GLUCOSE 314* 201* 401*  BUN 35* 36* 36*  CREATININE 2.81* 3.02* 3.12*  CALCIUM 7.8* 9.0 9.0   PT/INR  Basename 03/19/11 1806  LABPROT 12.5  INR 0.91   Cholesterol  Basename 03/20/11 0700  CHOL 232*   Cardiac Enzymes No components found with this basename: TROPONIN:3, CKMB:3  Studies/Results: 2D-Echo Study Conclusions  - Left ventricle: The cavity size was normal. Wall thickness was increased in a pattern of severe LVH. There was severe concentric hypertrophy. Systolic function was severely reduced. The estimated ejection fraction was in the range of 25% to 30%. Moderate to Severe diffuse  hypokinesis with distinct regional wall motion abnormalities. Severe hypokinesis of the inferior and inferoseptal myocardium. Doppler parameters are consistent with a reversible restrictive pattern, indicative of decreased left ventricular diastolic compliance and/or increased left atrial pressure (grade 3 diastolic dysfunction). Doppler parameters are consistent with both elevated ventricular end-diastolic filling pressure and elevated left atrial filling pressure. - Ventricular septum: The contour showed diastolic flattening and systolic flattening. - Aortic valve: Calcified annulus. Trileaflet; normal thickness, mildly calcified leaflets. - Mitral valve: Mobility of the anterior and posterior leaflet was mildly restricted. - Left atrium: The atrium was moderately dilated. - Right ventricle: Systolic function was mildly reduced.  Assessment/Plan    Principal Problem:  *NSTEMI (non-ST elevated myocardial infarction) Active Problems:  S/P CABG x 3:  March 2010: left internal mammary artery to distal left anterior descending, SVG to Circ marginal vessel, SVG to PDA  HTN (hypertension)  Hyperlipidemia  DM2 (diabetes mellitus, type 2)  A1C 10.5  CHF (congestive heart failure):   Systolic/ diastolic  EF 25-30%, Grade 3 diastolic dysfunction by echo this admission.  Chronic renal insufficiency:   Cr ~ 1.6 prior to Heart cath and CABG March 2010  Tobacco abuse  Marijuana abuse  Ischemic dilated cardiomyopathy:  EF 10% by echo 06/2008  CAD (coronary artery disease)  Plan: Some improvement in Cr.   Pt with  Some SOB today.  No overt signs of heart failure, however I do not want to overhydrate given echo results.  Decrease IV fluids to KVO.   EF 25-30%, Grade 3 diastolic dysfunction by echo this admission.  Holding off on left heart cath until Cr improves.  Appreciate Renal's assistance.  AM CBG elevated.   Adding Lantus 10units QAM.   LOS: 2 days    HAGER,BRYAN W 03/21/2011 9:08  AM   I have seen and examined the patient along with [facility].  I have reviewed the chart, notes and new data.  I agree with PA's note.  Key new complaints:mild dyspnea at rest Key examination changes: no overt signs of hypervolemia by physical exam Key new findings / data: echocardiogram suggests severely increased left atrial pressure  PLAN: Unfortunately we appear to have reached the limits of safe administration of extra IV fluids. He is showing echo and clinical signs of volume excess. Despite this his creatinine remains elevated at 2.6. Prior to his bypass surgery in 2010 the baseline creatinine appears to been around 1.6-1.8. Following bypass surgery the creatinine oscillated mostly in the 2-2.2 range.  Coronary angiography is necessary but will be associated with significant risk of contrast nephrotoxicity and meaningfully increased risk of permanent renal failure.we'll await nephrology's recommendations  Nasreen Goedecke,  MD, [facility] [phone] 03/21/2011, 12:08 PM  I went back to have a long discussion with Mr. Hull regarding the potential need for an implantable defibrillator. He has severe ischemic cardiomyopathy. His ejection fraction 2010 was estimated to be only 10% and is now 25-30%. He meets M A D I T-2 criteria for defibrillator implantation. He seems to be started back on cardiac medications. If he needs to undergo percutaneous revascularization, the decision regarding defibrillator implantation should be deferred for 90 days. If cardiac MRI is felt necessary to assess for viability this should be performed before deferrable her implantation.  The reasoning behind the ICD implantation, as well as the risks and benefits of the procedure and the device were discussed in detail. We also reviewed the possible complications at the time of the initial implantation as well as long-term problem such as inappropriate shocks. To have some time  to consider his options while we assess his coronary situation. Thurmon Fair, MD, [facility] [phone] 1:47 PM

## 2011-03-21 NOTE — Progress Notes (Signed)
ANTICOAGULATION CONSULT NOTE - Follow Up Consult  Pharmacy Consult for heparin Indication: chest pain/ACS  No Known Allergies  Assessment/Plan: 45 yom on heparin for ACS. Heparin level is subtherapeutic at 0.23, however heparin was off for at least 30 minutes due to an empty bag at the time this level was drawn.  Will continue heparin at 1400 units/hr and recheck a heparin level with am labs (~8h level)  .   Goal of Therapy:  Heparin level 0.3-0.7 units/ml   Plan:  Continue heparin gtt at 1400units/hr Check a 8 hour heparin level F/u cath plans Follow platelets closely, have declined from 140 to 109 in last 24h   Patient Measurements: Height: 5\' 11"  (180.3 cm) Weight: 195 lb 12.3 oz (88.8 kg) IBW/kg (Calculated) : 75.3   Vital Signs: Temp: 98.1 F (36.7 C) (11/26 1411) Temp src: Oral (11/26 1411) BP: 142/80 mmHg (11/26 1411) Pulse Rate: 64  (11/26 1411)  Labs:  Basename 03/21/11 1957 03/21/11 1159 03/21/11 0158 03/20/11 0700 03/20/11 0230 03/19/11 2218 03/19/11 1806  HGB -- -- 11.0* 11.6* -- -- --  HCT -- -- 31.8* 34.2* -- -- 38.1*  PLT -- -- 109* 123* -- -- 141*  APTT -- -- -- -- -- -- 35  LABPROT -- -- -- -- -- -- 12.5  INR -- -- -- -- -- -- 0.91  HEPARINUNFRC 0.23* 0.25* 0.17* -- -- -- --  CREATININE -- -- 2.81* -- 3.02* -- 3.12*  CKTOTAL -- -- -- 119 -- 143 --  CKMB -- -- -- 6.0* -- 6.4* --  TROPONINI -- -- -- 0.60* -- 0.91* --   Estimated Creatinine Clearance: 35 ml/min (by C-G formula based on Cr of 2.81).   Medications:  Scheduled:     . aspirin  324 mg Oral Pre-Cath  . aspirin EC  325 mg Oral Daily  . hydrALAZINE  25 mg Oral Q8H  . insulin aspart  0-15 Units Subcutaneous TID WC  . insulin glargine  10 Units Subcutaneous QAM  . levalbuterol  0.63 mg Nebulization TID  . metoprolol tartrate  25 mg Oral BID  . nicotine  21 mg Transdermal Daily  . rosuvastatin  10 mg Oral Daily   Infusions:     . sodium chloride 20 mL/hr at 03/21/11 1042  .  heparin 1,400 Units/hr (03/21/11 2024)  . nitroGLYCERIN 10 mcg/min (03/20/11 0700)     [name] [name] 03/21/2011,8:40 PM

## 2011-03-21 NOTE — Progress Notes (Signed)
Heparin level 0.17 on 950units/hr no bleeding reported. Patient due for cath 11/26 will increase to 1250/hr  Until off for cath and f/u afterwards.

## 2011-03-21 NOTE — Progress Notes (Signed)
ANTICOAGULATION CONSULT NOTE - Follow Up Consult  Pharmacy Consult for heparin Indication: chest pain/ACS  No Known Allergies  Patient Measurements: Height: 5\' 11"  (180.3 cm) Weight: 195 lb 12.3 oz (88.8 kg) IBW/kg (Calculated) : 75.3   Vital Signs: Temp: 97.9 F (36.6 C) (11/26 0632) Temp src: Oral (11/26 0632) BP: 136/75 mmHg (11/26 0632) Pulse Rate: 71  (11/26 0632)  Labs:  Basename 03/21/11 1159 03/21/11 0158 03/20/11 1833 03/20/11 0700 03/20/11 0230 03/19/11 2218 03/19/11 1806  HGB -- 11.0* -- 11.6* -- -- --  HCT -- 31.8* -- 34.2* -- -- 38.1*  PLT -- 109* -- 123* -- -- 141*  APTT -- -- -- -- -- -- 35  LABPROT -- -- -- -- -- -- 12.5  INR -- -- -- -- -- -- 0.91  HEPARINUNFRC 0.25* 0.17* <0.10* -- -- -- --  CREATININE -- 2.81* -- -- 3.02* -- 3.12*  CKTOTAL -- -- -- 119 -- 143 --  CKMB -- -- -- 6.0* -- 6.4* --  TROPONINI -- -- -- 0.60* -- 0.91* --   Estimated Creatinine Clearance: 35 ml/min (by C-G formula based on Cr of 2.81).   Medications:  Scheduled:    . aspirin  324 mg Oral Pre-Cath  . aspirin EC  325 mg Oral Daily  . heparin  3,000 Units Intravenous NOW  . hydrALAZINE  25 mg Oral Q8H  . insulin aspart  0-15 Units Subcutaneous TID WC  . insulin glargine  10 Units Subcutaneous QAM  . levalbuterol  0.63 mg Nebulization TID  . metoprolol tartrate  25 mg Oral BID  . nicotine  21 mg Transdermal Daily  . rosuvastatin  10 mg Oral Daily  . DISCONTD: aspirin EC  325 mg Oral Daily   Infusions:    . sodium chloride 20 mL/hr at 03/21/11 1042  . heparin 1,250 Units/hr (03/21/11 0609)  . nitroGLYCERIN 10 mcg/min (03/20/11 0700)  . DISCONTD: heparin 700 Units/hr (03/20/11 1610)    Assessment: 45 yom on heparin for ACS. Heparin level is subtherapeutic at 0.25. Pt will require cardiac cath but this is being delayed due to elevated Scr.   Goal of Therapy:  Heparin level 0.3-0.7 units/ml   Plan:  Increase heparin gtt to 1400units/hr Check a 6 hour heparin  level F/u cath plans  Pedrohenrique Mccon[place], Drake Leach 03/21/2011,1:26 PM

## 2011-03-22 ENCOUNTER — Encounter (HOSPITAL_COMMUNITY)
Admission: EM | Disposition: A | Discharge status: Home / Self Care | Payer: Self-pay | Source: Home / Self Care | Attending: Cardiovascular Disease

## 2011-03-22 DIAGNOSIS — I5189 Other ill-defined heart diseases: Secondary | ICD-10-CM | POA: Diagnosis present

## 2011-03-22 DIAGNOSIS — D696 Thrombocytopenia, unspecified: Secondary | ICD-10-CM | POA: Diagnosis present

## 2011-03-22 LAB — CBC
HCT: 29.6 % — ABNORMAL LOW (ref 39.0–52.0)
MCV: 87.3 fL (ref 78.0–100.0)
Platelets: 93 10*3/uL — ABNORMAL LOW (ref 150–400)
RBC: 3.39 MIL/uL — ABNORMAL LOW (ref 4.22–5.81)
RDW: 13.2 % (ref 11.5–15.5)
WBC: 5.5 10*3/uL (ref 4.0–10.5)

## 2011-03-22 LAB — BASIC METABOLIC PANEL
BUN: 27 mg/dL — ABNORMAL HIGH (ref 6–23)
Chloride: 107 mEq/L (ref 96–112)
GFR calc Af Amer: 33 mL/min — ABNORMAL LOW (ref >90)
GFR calc non Af Amer: 28 mL/min — ABNORMAL LOW (ref >90)
Potassium: 4.1 mEq/L (ref 3.5–5.1)
Sodium: 137 mEq/L (ref 135–145)

## 2011-03-22 LAB — GLUCOSE, CAPILLARY
Glucose-Capillary: 194 mg/dL — ABNORMAL HIGH (ref 70–99)
Glucose-Capillary: 183 mg/dL — ABNORMAL HIGH (ref 70–99)
Glucose-Capillary: 222 mg/dL — ABNORMAL HIGH (ref 70–99)

## 2011-03-22 SURGERY — LEFT HEART CATHETERIZATION WITH CORONARY/GRAFT ANGIOGRAM
Anesthesia: LOCAL

## 2011-03-22 MED ORDER — BD GETTING STARTED TAKE HOME KIT: 1/2ML X 30G SYRINGES: 1.0000 | Freq: Once | Status: DC

## 2011-03-22 MED ORDER — METOPROLOL TARTRATE 50 MG PO TABS
50.0000 mg | ORAL_TABLET | Freq: Two times a day (BID) | ORAL | Status: DC
  Administered 2011-03-22 – 2011-03-23 (×3): 50 mg via ORAL
  Filled 2011-03-22 (×4): qty 1

## 2011-03-22 MED ORDER — HEPARIN BOLUS VIA INFUSION
2500.0000 [IU] | Freq: Once | INTRAVENOUS | Status: AC
  Administered 2011-03-22: 2500 [IU] via INTRAVENOUS
  Filled 2011-03-22: qty 2500

## 2011-03-22 MED ORDER — BD GETTING STARTED TAKE HOME KIT: 3/10ML X 30G SYRINGES
1.0000 | Freq: Once | Status: AC
  Administered 2011-03-22: 1
  Filled 2011-03-22: qty 1

## 2011-03-22 MED ORDER — INSULIN ASPART 100 UNIT/ML ~~LOC~~ SOLN
0.0000 [IU] | Freq: Three times a day (TID) | SUBCUTANEOUS | Status: DC
  Administered 2011-03-22: 7 [IU] via SUBCUTANEOUS
  Administered 2011-03-22: 4 [IU] via SUBCUTANEOUS
  Administered 2011-03-23: 7 [IU] via SUBCUTANEOUS
  Administered 2011-03-23: 4 [IU] via SUBCUTANEOUS

## 2011-03-22 NOTE — Progress Notes (Signed)
Patient ID: [name], male   DOB: [birth year], 45 y.o.   MRN: [id number]   [facility]  Subjective: SOB about the same.  No orthopnea or PND.  Objective: Vital signs in last 24 hours: Temp:  [97.8 F (36.6 C)-98.3 F (36.8 C)] 97.8 F (36.6 C) (11/27 0539) Pulse Rate:  [60-82] 82  (11/27 0539) Resp:  [16-20] 19  (11/27 0539) BP: (142-157)/(77-81) 157/81 mmHg (11/27 0539) SpO2:  [98 %-100 %] 100 % (11/27 0539) Weight:  [92.715 kg (204 lb 6.4 oz)] 204 lb 6.4 oz (92.715 kg) (11/27 0539) Last BM Date: 03/20/11  Intake/Output from previous day: 11/26 0701 - 11/27 0700 In: 1440 [P.O.:1440] Out: 2600 [Urine:2600] Intake/Output this shift:    Medications Current Facility-Administered Medications  Medication Dose Route Frequency Provider Last Rate Last Dose  . 0.9 %  sodium chloride infusion   Intravenous Continuous Dwana Melena, PA 10 mL/hr at 03/21/11 1930    . acetaminophen (TYLENOL) tablet 650 mg  650 mg Oral Q4H PRN Dwana Melena, PA   650 mg at 03/21/11 2242  . aspirin EC tablet 325 mg  325 mg Oral Daily Severiano Gilbert, [place]      . heparin ADULT infusion 100 units/mL (25000 units/250 mL)  1,400 Units/hr Intravenous Continuous Drake Leach Rumbarger, PHARMD 14 mL/hr at 03/21/11 2200 14 mL/hr at 03/21/11 2200  . hydrALAZINE (APRESOLINE) tablet 25 mg  25 mg Oral Q8H Dwana Melena, [place]   25 mg at 03/22/11 0617  . insulin aspart (novoLOG) injection 0-15 Units  0-15 Units Subcutaneous TID WC Dwana Melena, PA   3 Units at 03/22/11 [phone]  . insulin glargine (LANTUS) injection 10 Units  10 Units Subcutaneous QAM Dwana Melena, PA   10 Units at 03/22/11 1191  . levalbuterol (XOPENEX) nebulizer solution 0.63 mg  0.63 mg Nebulization TID Dwana Melena, PA   0.63 mg at 03/21/11 2057  . metoprolol tartrate (LOPRESSOR) tablet 25 mg  25 mg Oral BID Dwana Melena, PA   25 mg at 03/21/11 2237  . nicotine (NICODERM CQ - dosed in mg/24 hours) patch 21 mg  21 mg  Transdermal Daily Dwana Melena, PA   21 mg at 03/20/11 1030  . nitroGLYCERIN (NITROSTAT) SL tablet 0.4 mg  0.4 mg Sublingual Q5 min PRN Dwana Melena, PA      . nitroGLYCERIN 0.2 mg/mL in dextrose 5 % infusion  10 mcg/min Intravenous Titrated Dwana Melena, PA 3 mL/hr at 03/21/11 1930 10 mcg/min at 03/21/11 1930  . ondansetron (ZOFRAN) injection 4 mg  4 mg Intravenous Q6H PRN Dwana Melena, PA      . rosuvastatin (CRESTOR) tablet 10 mg  10 mg Oral Daily Dwana Melena, PA   10 mg at 03/21/11 1046    PE: General appearance: alert, cooperative and no distress Lungs: clear to auscultation bilaterally Heart: regular rate and rhythm, S1, S2 normal, no murmur, click, rub or gallop Extremities: No LEE.  Lab Results:   Basename 03/22/11 0555 03/21/11 0158 03/20/11 0700  WBC 5.5 6.5 7.2  HGB 9.9* 11.0* 11.6*  HCT 29.6* 31.8* 34.2*  PLT 93* 109* 123*   BMET  Basename 03/22/11 0555 03/21/11 0158 03/20/11 0230  NA 137 136 135  K 4.1 3.9 3.8  CL 107 106 102  CO2 20 22 19   GLUCOSE 195* 314* 201*  BUN 27* 35* 36*  CREATININE 2.58* 2.81* 3.02*  CALCIUM 8.3* 7.8*  9.0   PT/INR  Basename 03/19/11 1806  LABPROT 12.5  INR 0.91   Cholesterol  Basename 03/20/11 0700  CHOL 232*   Cardiac Enzymes No components found with this basename: TROPONIN:3, CKMB:3  Studies/Results: @RISRSLT2 @   Assessment/Plan  Principal Problem:  *NSTEMI (non-ST elevated myocardial infarction) Active Problems:  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  EF 10% by echo 06/2008  CAD (coronary artery disease)  Diastolic dysfunction, Grade 3 Thrombocytopenia  Plan:   Increase Lopressor to 50mg  bid, po.  Increase SS insulin to resistant scale.  Lantus 10 units added yesterday.  Cr. Continues to improve.  Now 2.58.  Thanks renal!   Hydrated initially for two days.  Fluids held yesterday due to echo results and new SOB.  Net fluid ~-2500 for the last two days without diuretic.  ICD discussed and declined by pt. Will schedule Steffanie Dunn for tomorrow.  Will need to go home on insulin.  Will request diabetes education.  Amemia likely dilutional.  Will continue to monitor.   LOS: 3 days    HAGER,BRYAN W 03/22/2011 8:30 AM   Agree with note written by Jones Skene PAC  Pt with ISCM. S/P CABG 2 years ago. Continues to smoke and is medically noncompliant.Admitted with syncope. It sounds like he tripped. Moderate renal insufficiency but creat decreasing. Doubt ACS. EKG with LVH, repol changes and septal Qs. Will get Lexiscan myoview to risk stratify. Long discussion with pt regarding ICD therapy.  Runell Gess 03/22/2011 9:55 AM

## 2011-03-22 NOTE — Progress Notes (Signed)
ANTICOAGULATION CONSULT NOTE - Follow Up Consult  Pharmacy Consult for heparin Indication: chest pain/ACS  No Known Allergies  Assessment/Plan: 45yo male on heparin for chest pain, heparin level subtherapeutic.  Heparin level=0.14  Goal of Therapy:  Heparin level 0.3-0.7 units/ml   Plan:  Bolus 2500 units IV heparin x 1, then increase heparin drip to 1650 units/hr.  Check heparin level 6 hours after rate change.  Daily heparin level/CBC.    Patient Measurements: Height: 5\' 11"  (180.3 cm) Weight: 204 lb 6.4 oz (92.715 kg) IBW/kg (Calculated) : 75.3   Vital Signs: Temp: 98.3 F (36.8 C) (11/27 1327) BP: 143/80 mmHg (11/27 1327)  Labs:  Basename 03/22/11 1659 03/22/11 0555 03/21/11 1957 03/21/11 0158 03/20/11 0700 03/20/11 0230 03/19/11 2218  HGB -- 9.9* -- 11.0* -- -- --  HCT -- 29.6* -- 31.8* 34.2* -- --  PLT -- 93* -- 109* 123* -- --  APTT -- -- -- -- -- -- --  LABPROT -- -- -- -- -- -- --  INR -- -- -- -- -- -- --  HEPARINUNFRC 0.14* 0.29* 0.23* -- -- -- --  CREATININE -- 2.58* -- 2.81* -- 3.02* --  CKTOTAL -- -- -- -- 119 -- 143  CKMB -- -- -- -- 6.0* -- 6.4*  TROPONINI -- -- -- -- 0.60* -- 0.91*   Estimated Creatinine Clearance: 41.6 ml/min (by C-G formula based on Cr of 2.58).   Medications:  Scheduled:     . aspirin EC  325 mg Oral Daily  . bd getting started take home kit  1 kit Other Once  . hydrALAZINE  25 mg Oral Q8H  . insulin aspart  0-20 Units Subcutaneous TID WC  . insulin glargine  10 Units Subcutaneous Q0700  . levalbuterol  0.63 mg Nebulization TID  . metoprolol tartrate  50 mg Oral BID  . nicotine  21 mg Transdermal Daily  . rosuvastatin  10 mg Oral Daily  . DISCONTD: bd getting started take home kit  1 kit Other Once  . DISCONTD: insulin aspart  0-15 Units Subcutaneous TID WC  . DISCONTD: metoprolol tartrate  25 mg Oral BID   Infusions:     . sodium chloride 10 mL/hr at 03/21/11 1930  . heparin 1,400 Units/hr (03/22/11 1416)  .  nitroGLYCERIN 5 mcg/min (03/22/11 1030)    Wendie Simmer, PharmD, BCPS Clinical Pharmacist  Pager: [phone]  03/22/2011,7:36 PM

## 2011-03-22 NOTE — Consult Note (Signed)
Pt smokes 1 ppd and is in action stage and eager to quit. He says he will quit cold [place] and doesn't need any med aids to help him quit. Encouraged and advised to quit. Mentioned med aids and that they are available if cold [place] doesn't work out for him. Referred to 1-800 quit now for f/u and support. Discussed oral fixation substitutes, second hand smoke and in home smoking policy. Reviewed and gave pt Written education/contact information.

## 2011-03-22 NOTE — Progress Notes (Signed)
Inpatient Diabetes Program Recommendations  AACE/ADA: New Consensus Statement on Inpatient Glycemic Control (2009)  Target Ranges:  Prepandial:   less than 140 mg/dL      Peak postprandial:   less than 180 mg/dL (1-2 hours)      Critically ill patients:  140 - 180 mg/dL   Reason for Visit: Elevated fasting glucose:  195 mg/dL as well as elevated prandial glucose in 200-300s  Inpatient Diabetes Program Recommendations Insulin - Basal: Increase Lantus to 15 units daily Insulin - Meal Coverage: Add Novolog 3 units TID for meal coverage Outpatient Referral: Please order outpatient Diabetes Education

## 2011-03-22 NOTE — Progress Notes (Signed)
Subjective: Awake, alert, talking to Smoking cessation specialist  Objective: Vital signs in last 24 hours: Blood pressure 157/81, pulse 82, temperature 97.8 F (36.6 C), temperature source Oral, resp. rate 19, height 5\' 11"  (1.803 m), weight 92.715 kg (204 lb 6.4 oz), SpO2 100.00%.   Intake/Output from previous day: 11/26 0701 - 11/27 0700 In: 1440 [P.O.:1440] Out: 2600 [Urine:2600] Intake/Output this shift: Total I/O In: 240 [P.O.:240] Out: 200 [Urine:200]  PHYSICAL EXAM General--No pain, wants to go home Chest--clear Heart--no rub Abd--nontender Extr--no edema, lots of tattoos  Lab Results:   Lab 03/22/11 0555 03/21/11 0158 03/20/11 0230  NA 137 136 135  K 4.1 3.9 3.8  CL 107 106 102  CO2 20 22 19   BUN 27* 35* 36*  CREATININE 2.58* 2.81* 3.02*  EGFR -- -- --  GLUCOSE 195* -- --  CALCIUM 8.3* 7.8* 9.0  PHOS -- -- --      Basename 03/22/11 0555 03/21/11 0158  WBC 5.5 6.5  HGB 9.9* 11.0*  HCT 29.6* 31.8*  PLT 93* 109*    Scheduled: Continuous:  Assessment/Plan: Renal function improving.  Baseline Cr 2.01 July 2008  Would avoid ACE inhibitors for now Check Fe/TIBC/Ferritin to see if he's iron deficient No other new suggestions.   Will sign off. Call if renal help needed again  LOS: 3 days   Samya Siciliano F 03/22/2011,10:54 AM

## 2011-03-22 NOTE — Progress Notes (Signed)
ANTICOAGULATION CONSULT NOTE - Follow Up Consult  Pharmacy Consult for heparin Indication: chest pain/ACS  No Known Allergies  Assessment/Plan: 45yo male now just barely subtherapeutic on heparin for CP, scheduled for cath this am at 0900.   Goal of Therapy:  Heparin level 0.3-0.7 units/ml   Plan:  Will continue at current rate for now and f/u after cath.   Patient Measurements: Height: 5\' 11"  (180.3 cm) Weight: 204 lb 6.4 oz (92.715 kg) IBW/kg (Calculated) : 75.3   Vital Signs: Temp: 97.8 F (36.6 C) (11/27 0539) Temp src: Oral (11/27 0539) BP: 157/81 mmHg (11/27 0539) Pulse Rate: 82  (11/27 0539)  Labs:  Basename 03/22/11 0555 03/21/11 1957 03/21/11 1159 03/21/11 0158 03/20/11 0700 03/20/11 0230 03/19/11 2218 03/19/11 1806  HGB 9.9* -- -- 11.0* -- -- -- --  HCT 29.6* -- -- 31.8* 34.2* -- -- --  PLT 93* -- -- 109* 123* -- -- --  APTT -- -- -- -- -- -- -- 35  LABPROT -- -- -- -- -- -- -- 12.5  INR -- -- -- -- -- -- -- 0.91  HEPARINUNFRC 0.29* 0.23* 0.25* -- -- -- -- --  CREATININE 2.58* -- -- 2.81* -- 3.02* -- --  CKTOTAL -- -- -- -- 119 -- 143 --  CKMB -- -- -- -- 6.0* -- 6.4* --  TROPONINI -- -- -- -- 0.60* -- 0.91* --   Estimated Creatinine Clearance: 41.6 ml/min (by C-G formula based on Cr of 2.58).   Medications:  Scheduled:     . aspirin EC  325 mg Oral Daily  . hydrALAZINE  25 mg Oral Q8H  . insulin aspart  0-15 Units Subcutaneous TID WC  . insulin glargine  10 Units Subcutaneous QAM  . levalbuterol  0.63 mg Nebulization TID  . metoprolol tartrate  25 mg Oral BID  . nicotine  21 mg Transdermal Daily  . rosuvastatin  10 mg Oral Daily   Infusions:     . sodium chloride 10 mL/hr at 03/21/11 1930  . heparin 14 mL/hr (03/21/11 2200)  . nitroGLYCERIN 10 mcg/min (03/21/11 1930)     Colleen Can PharmD BCPS 03/22/2011,7:25 AM

## 2011-03-23 ENCOUNTER — Inpatient Hospital Stay (HOSPITAL_COMMUNITY): Payer: Medicaid Other

## 2011-03-23 LAB — HEPARIN LEVEL (UNFRACTIONATED)
Heparin Unfractionated: 0.24 IU/mL — ABNORMAL LOW (ref 0.30–0.70)
Heparin Unfractionated: 0.48 IU/mL (ref 0.30–0.70)

## 2011-03-23 LAB — IRON AND TIBC
Iron: 65 ug/dL (ref 42–135)
TIBC: 247 ug/dL (ref 215–435)

## 2011-03-23 LAB — BASIC METABOLIC PANEL
BUN: 27 mg/dL — ABNORMAL HIGH (ref 6–23)
CO2: 21 mEq/L (ref 19–32)
Calcium: 8.6 mg/dL (ref 8.4–10.5)
Chloride: 107 mEq/L (ref 96–112)
Creatinine, Ser: 2.43 mg/dL — ABNORMAL HIGH (ref 0.50–1.35)
Glucose, Bld: 221 mg/dL — ABNORMAL HIGH (ref 70–99)

## 2011-03-23 LAB — GLUCOSE, CAPILLARY

## 2011-03-23 LAB — CBC
HCT: 32.2 % — ABNORMAL LOW (ref 39.0–52.0)
Hemoglobin: 10.8 g/dL — ABNORMAL LOW (ref 13.0–17.0)
MCH: 29.3 pg (ref 26.0–34.0)
MCHC: 33.5 g/dL (ref 30.0–36.0)
MCV: 87.3 fL (ref 78.0–100.0)
RBC: 3.69 MIL/uL — ABNORMAL LOW (ref 4.22–5.81)

## 2011-03-23 MED ORDER — HYDRALAZINE HCL 50 MG PO TABS: 50.0000 mg | ORAL_TABLET | Freq: Three times a day (TID) | ORAL | Status: DC

## 2011-03-23 MED ORDER — INSULIN GLARGINE 100 UNIT/ML ~~LOC~~ SOLN: 15.0000 [IU] | SUBCUTANEOUS | Status: DC

## 2011-03-23 MED ORDER — INSULIN GLARGINE 100 UNIT/ML ~~LOC~~ SOLN
15.0000 [IU] | SUBCUTANEOUS | Status: DC
  Administered 2011-03-23: 15 [IU] via SUBCUTANEOUS

## 2011-03-23 MED ORDER — ISOSORBIDE DINITRATE 5 MG PO TABS: 10.0000 mg | ORAL_TABLET | Freq: Three times a day (TID) | ORAL | Status: DC

## 2011-03-23 MED ORDER — TECHNETIUM TC 99M TETROFOSMIN IV KIT
30.0000 | PACK | Freq: Once | INTRAVENOUS | Status: AC | PRN
  Administered 2011-03-23: 30 via INTRAVENOUS

## 2011-03-23 MED ORDER — HYDRALAZINE HCL 50 MG PO TABS
50.0000 mg | ORAL_TABLET | Freq: Three times a day (TID) | ORAL | Status: DC
  Filled 2011-03-23 (×2): qty 1

## 2011-03-23 MED ORDER — ACETAMINOPHEN 325 MG PO TABS: 650.0000 mg | ORAL_TABLET | ORAL | Status: AC | PRN

## 2011-03-23 MED ORDER — METOPROLOL TARTRATE 50 MG PO TABS: 50.0000 mg | ORAL_TABLET | Freq: Two times a day (BID) | ORAL | Status: DC

## 2011-03-23 MED ORDER — ROSUVASTATIN CALCIUM 10 MG PO TABS: 10.0000 mg | ORAL_TABLET | Freq: Every day | ORAL | Status: DC

## 2011-03-23 MED ORDER — REGADENOSON 0.4 MG/5ML IV SOLN
0.4000 mg | Freq: Once | INTRAVENOUS | Status: AC
  Administered 2011-03-23: 0.4 mg via INTRAVENOUS

## 2011-03-23 MED ORDER — TECHNETIUM TC 99M TETROFOSMIN IV KIT
10.0000 | PACK | Freq: Once | INTRAVENOUS | Status: AC | PRN
  Administered 2011-03-23: 10 via INTRAVENOUS

## 2011-03-23 MED ORDER — NICOTINE 21 MG/24HR TD PT24: 21.0000 | MEDICATED_PATCH | Freq: Every day | TRANSDERMAL | Status: AC

## 2011-03-23 NOTE — Progress Notes (Signed)
ANTICOAGULATION CONSULT NOTE - Follow Up Consult  Pharmacy Consult for Heparin Indication: chest pain/ACS  No Known Allergies  Patient Measurements: Height: 5\' 11"  (180.3 cm) Weight: 202 lb 2.6 oz (91.7 kg) IBW/kg (Calculated) : 75.3  Heparin Dosing Weight: 91.7 kg  Vital Signs: Temp: 97.8 F (36.6 C) (11/28 1443) Temp src: Oral (11/28 1443) BP: 161/90 mmHg (11/28 1443) Pulse Rate: 76  (11/28 1443)  Labs:  Basename 03/23/11 1000 03/23/11 0244 03/22/11 1659 03/22/11 0555 03/21/11 0158  HGB -- 10.8* -- 9.9* --  HCT -- 32.2* -- 29.6* 31.8*  PLT -- 99* -- 93* 109*  APTT -- -- -- -- --  LABPROT -- -- -- -- --  INR -- -- -- -- --  HEPARINUNFRC 0.48 0.24* 0.14* -- --  CREATININE -- 2.43* -- 2.58* 2.81*  CKTOTAL -- -- -- -- --  CKMB -- -- -- -- --  TROPONINI -- -- -- -- --   Estimated Creatinine Clearance: 44 ml/min (by C-G formula based on Cr of 2.43).   Assessment: 45 y.o. M on heparin for ACS sx with a therapeutic HL this a.m. No signs/symptoms of bleeding noted. Heparin infusing at proper rate.  Goal of Therapy:  Heparin level 0.3-0.7 units/ml   Plan:  1. Continue heparin at current rate of 1900 units/hr (19 ml/hr) 2. Will continue to monitor for any signs/symptoms of bleeding and will follow up with heparin level in the a.m.   Rolley Sims 03/23/2011,2:59 PM

## 2011-03-23 NOTE — Progress Notes (Signed)
Clinical Social Worker completed the psychosocial assessment, which can be found in the shadow chart. Patient stated that he needed help regarding medicaid. CSW called the financial counselor to help assist this patient, but had to leave a message. CSW will follow up with financial counselor to confirm the referral.   Rozetta Nunnery, MSW, [employer] [phone]

## 2011-03-23 NOTE — Progress Notes (Signed)
Patient ID: [name], male   DOB: 25-Jul-1965, 45 y.o.   MRN: [id number]   [facility]  Subjective: Doing well  Objective: Vital signs in last 24 hours: Temp:  [97.3 F (36.3 C)-98.6 F (37 C)] 97.3 F (36.3 C) (11/28 0448) Pulse Rate:  [64-74] 64  (11/28 0448) Resp:  [18-19] 18  (11/28 0448) BP: (127-153)/(77-93) 127/77 mmHg (11/28 0448) SpO2:  [98 %-100 %] 98 % (11/28 0448) Weight:  [91.7 kg (202 lb 2.6 oz)] 202 lb 2.6 oz (91.7 kg) (11/28 0448) Last BM Date: 03/22/11  Intake/Output from previous day: 11/27 0701 - 11/28 0700 In: 826.5 [P.O.:720; I.V.:66.5] Out: 1676 [Urine:1675; Stool:1] Intake/Output this shift:    Medications Current Facility-Administered Medications  Medication Dose Route Frequency Provider Last Rate Last Dose  . 0.9 %  sodium chloride infusion   Intravenous Continuous Dwana Melena, PA 10 mL/hr at 03/22/11 1900    . acetaminophen (TYLENOL) tablet 650 mg  650 mg Oral Q4H PRN Dwana Melena, PA   650 mg at 03/21/11 2242  . aspirin EC tablet 325 mg  325 mg Oral Daily Severiano Gilbert, PHARMD   325 mg at 03/22/11 1031  . bd getting started take home kit 3/10 ml X 30g syringes 1 kit  1 kit Other Once [facility], [place]   1 kit at 03/22/11 1032  . heparin 100 units/mL bolus via infusion 2,500 Units  2,500 Units Intravenous Once Marylouise Stacks, PHARMD   2,500 Units at 03/22/11 2004  . heparin ADULT infusion 100 units/mL (25000 units/250 mL)  1,900 Units/hr Intravenous Continuous Colleen Can, [place] 19 mL/hr at 03/23/11 0604 1,900 Units/hr at 03/23/11 0604  . hydrALAZINE (APRESOLINE) tablet 25 mg  25 mg Oral Q8H Dwana Melena, [place]   25 mg at 03/23/11 4098  . insulin aspart (novoLOG) injection 0-20 Units  0-20 Units Subcutaneous TID WC Dwana Melena, PA   7 Units at 03/22/11 1626  . insulin glargine (LANTUS) injection 15 Units  15 Units Subcutaneous Q0700 Dwana Melena, PA      . levalbuterol [facility]) nebulizer  solution 0.63 mg  0.63 mg Nebulization TID Dwana Melena, PA   0.63 mg at 03/22/11 2109  . metoprolol (LOPRESSOR) tablet 50 mg  50 mg Oral BID Dwana Melena, PA   50 mg at 03/22/11 2137  . nicotine (NICODERM CQ - dosed in mg/24 hours) patch 21 mg  21 mg Transdermal Daily Dwana Melena, PA   21 mg at 03/20/11 1030  . nitroGLYCERIN (NITROSTAT) SL tablet 0.4 mg  0.4 mg Sublingual Q5 min PRN Dwana Melena, PA      . nitroGLYCERIN 0.2 mg/mL in dextrose 5 % infusion  5 mcg/min Intravenous Titrated Dwana Melena, PA 1.5 mL/hr at 03/22/11 1900 5 mcg/min at 03/22/11 1900  . ondansetron (ZOFRAN) injection 4 mg  4 mg Intravenous Q6H PRN Dwana Melena, PA      . rosuvastatin (CRESTOR) tablet 10 mg  10 mg Oral Daily Dwana Melena, PA   10 mg at 03/22/11 1033  . DISCONTD: bd getting started take home kit 1/2 ml x 30g syringes 1 kit  1 kit Other Once Mihai Croitoru      . DISCONTD: insulin aspart (novoLOG) injection 0-15 Units  0-15 Units Subcutaneous TID WC Dwana Melena, PA   3 Units at 03/22/11 [phone]  . DISCONTD: insulin glargine (LANTUS) injection 10 Units  10  Units Subcutaneous Q0700 Dwana Melena, [place]   10 Units at 03/22/11 1610  . DISCONTD: metoprolol tartrate (LOPRESSOR) tablet 25 mg  25 mg Oral BID Dwana Melena, PA   25 mg at 03/21/11 2237    PE: General appearance: alert, cooperative and no distress Lungs: clear to auscultation bilaterally Heart: regular rate and rhythm, S1, S2 normal, no murmur, click, rub or gallop Extremities: No Edema Abdomen: soft, NT/ND/ NABS, no HSM Neck: no carotid bruit with mildly elevated JVP. 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  EF 10% by echo 06/2008  CAD (coronary artery disease)  Diastolic dysfunction, Grade 3  Thrombocytopenia-chronic   Plan:  BP better controlled.  Increasing Lantus to 15units daily.  The pt is scared of needles.  Will send home with Lantus pen.  He probably needs to ease into  multiple shots per day.  Steffanie Dunn for today.  Cr continues to improve.  Holding ACE.  ICD was discussed yesterday.  PT declines at this time.   LOS: 4 days    HAGER,BRYAN W 03/23/2011 7:58 AM  I have seen and examined the patient this PM after his stress test.  He was seen earlier today by Wilburt Finlay, PA. I have reviewed the chart, notes and new data.  I agree with Bryan's note.  Key new complaints: None, No CP/DOE Key examination changes: Agree with exam above: no edema, no rales; BP better Key new findings / data: Myoview results demonstrate "possible mild" apical ischemia -- this is interpreted as LOW RISK  PLAN: He is symptomatically stable with titration of cardiac medications. Plan is for d/c to home today & f/u @ [facility] with Dr. Royann Shivers.  Marykay Lex, M.D., M.S. [facility] [address] [place], [place]  [address]  [phone]  03/23/2011 7:05 PM

## 2011-03-23 NOTE — Discharge Summary (Signed)
Patient ID: [name],  MRN: [id number], DOB/AGE: 1965-07-04 45 y.o.  Admit date: 03/19/2011 Discharge date: 03/23/2011  Primary Care Provider: none Primary Cardiologist: Dr Royann Shivers  Discharge Diagnoses Principal Problem:  *NSTEMI (non-ST elevated myocardial infarction) Active Problems:  S/P CABG x 3:  March 2010: left internal mammary artery to distal left anterior descending, SVG to Circ marginal vessel, SVG to PDA  HTN (hypertension)  Hyperlipidemia  DM2 (diabetes mellitus, type 2)  CHF (congestive heart failure): Systolic/ diastolic  Chronic renal insufficiency  Tobacco abuse  Marijuana abuse  Ischemic dilated cardiomyopathy:  EF 25-30% by echo this admission 03/20/11.  EF 10% by echo 06/2008  CAD (coronary artery disease)  Diastolic dysfunction, Grade 3  Thrombocytopenia-chronic    Procedures: Myoview  History of Present Illness : HPI: 45-year-old Caucasian male with history of coronary artery bypass grafting x3 in March of 2010 with LIMA to the distal LAD, SVG to the circumflex marginal branch, SVG to PDA. 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 The patient states that he was walking through a doorway when the wind caught the door and blew it back catching his foot. He had already stepped off of the first step with his other foot and the door caught him. He subsequently fell rolling he said he hit his chest on the second step.at that point he says he felt like his heart was stretched. His chest pain was for a 10 in intensity it had decreased to 2/10 currently is pain-free. He reports that he has been out of his medications for the last 2 weeks. He also reports that he has not seen a cardiologist and since his coronary artery bypass  grafting.    Hospital Course Patient admitted and cardiac enzymes cycled. Troponin slightly positive but patient also had renal insufficiency with Cr of 2-3. He was seen in consult by the renal service and hydrated as he could tolerate. It was decided to risk stratify with Myoview. This showed mild ischemia and the plan is for medical treatment.  Discharge Vitals:  Blood pressure 161/90, pulse 76, temperature 97.8 F (36.6 C), temperature source Oral, resp. rate 18, height 5\' 11"  (1.803 m), weight 91.7 kg (202 lb 2.6 oz), SpO2 99.00%.    Labs: Results for orders placed during the hospital encounter of 03/19/11 (from the past 48 hour(s))  HEPARIN LEVEL (UNFRACTIONATED)     Status: Abnormal   Collection Time   03/21/11  7:57 PM      Component Value Range Comment   Heparin Unfractionated 0.23 (*) 0.30 - 0.70 (IU/mL)   GLUCOSE, CAPILLARY     Status: Abnormal   Collection Time   03/21/11  8:52 PM      Component Value Range Comment   Glucose-Capillary 232 (*) 70 - 99 (mg/dL)    Comment 1 Notify RN     GLUCOSE, CAPILLARY     Status: Abnormal   Collection Time   03/22/11  5:40 AM      Component Value Range Comment   Glucose-Capillary 194 (*) 70 - 99 (mg/dL)   CBC     Status: Abnormal   Collection Time   03/22/11  5:55 AM      Component Value Range Comment   WBC 5.5  4.0 - 10.5 (K/uL)    RBC 3.39 (*) 4.22 - 5.81 (MIL/uL)  Hemoglobin 9.9 (*) 13.0 - 17.0 (g/dL)    HCT 16.1 (*) 09.6 - 52.0 (%)    MCV 87.3  78.0 - 100.0 (fL)    MCH 29.2  26.0 - 34.0 (pg)    MCHC 33.4  30.0 - 36.0 (g/dL)    RDW 04.5  40.9 - 81.1 (%)    Platelets 93 (*) 150 - 400 (K/uL) CONSISTENT WITH PREVIOUS RESULT  HEPARIN LEVEL (UNFRACTIONATED)     Status: Abnormal   Collection Time   03/22/11  5:55 AM      Component Value Range Comment   Heparin Unfractionated 0.29 (*) 0.30 - 0.70 (IU/mL)   BASIC METABOLIC PANEL     Status: Abnormal   Collection Time   03/22/11  5:55 AM      Component Value Range Comment    Sodium 137  135 - 145 (mEq/L)    Potassium 4.1  3.5 - 5.1 (mEq/L)    Chloride 107  96 - 112 (mEq/L)    CO2 20  19 - 32 (mEq/L)    Glucose, Bld 195 (*) 70 - 99 (mg/dL)    BUN 27 (*) 6 - 23 (mg/dL)    Creatinine, Ser 9.14 (*) 0.50 - 1.35 (mg/dL)    Calcium 8.3 (*) 8.4 - 10.5 (mg/dL)    GFR calc non Af Amer 28 (*) >90 (mL/min)    GFR calc Af Amer 33 (*) >90 (mL/min)   GLUCOSE, CAPILLARY     Status: Abnormal   Collection Time   03/22/11 12:34 PM      Component Value Range Comment   Glucose-Capillary 183 (*) 70 - 99 (mg/dL)    Comment 1 Notify RN      Comment 2 Documented in Chart     GLUCOSE, CAPILLARY     Status: Abnormal   Collection Time   03/22/11  3:58 PM      Component Value Range Comment   Glucose-Capillary 222 (*) 70 - 99 (mg/dL)   HEPARIN LEVEL (UNFRACTIONATED)     Status: Abnormal   Collection Time   03/22/11  4:59 PM      Component Value Range Comment   Heparin Unfractionated 0.14 (*) 0.30 - 0.70 (IU/mL)   GLUCOSE, CAPILLARY     Status: Abnormal   Collection Time   03/22/11  9:41 PM      Component Value Range Comment   Glucose-Capillary 190 (*) 70 - 99 (mg/dL)    Comment 1 Notify RN     CBC     Status: Abnormal   Collection Time   03/23/11  2:44 AM      Component Value Range Comment   WBC 6.9  4.0 - 10.5 (K/uL)    RBC 3.69 (*) 4.22 - 5.81 (MIL/uL)    Hemoglobin 10.8 (*) 13.0 - 17.0 (g/dL)    HCT 78.2 (*) 95.6 - 52.0 (%)    MCV 87.3  78.0 - 100.0 (fL)    MCH 29.3  26.0 - 34.0 (pg)    MCHC 33.5  30.0 - 36.0 (g/dL)    RDW 21.3  08.6 - 57.8 (%)    Platelets 99 (*) 150 - 400 (K/uL) CONSISTENT WITH PREVIOUS RESULT  HEPARIN LEVEL (UNFRACTIONATED)     Status: Abnormal   Collection Time   03/23/11  2:44 AM      Component Value Range Comment   Heparin Unfractionated 0.24 (*) 0.30 - 0.70 (IU/mL)   BASIC METABOLIC PANEL     Status: Abnormal   Collection  Time   03/23/11  2:44 AM      Component Value Range Comment   Sodium 137  135 - 145 (mEq/L)    Potassium 4.0  3.5  - 5.1 (mEq/L)    Chloride 107  96 - 112 (mEq/L)    CO2 21  19 - 32 (mEq/L)    Glucose, Bld 221 (*) 70 - 99 (mg/dL)    BUN 27 (*) 6 - 23 (mg/dL)    Creatinine, Ser 1.61 (*) 0.50 - 1.35 (mg/dL)    Calcium 8.6  8.4 - 10.5 (mg/dL)    GFR calc non Af Amer 30 (*) >90 (mL/min)    GFR calc Af Amer 35 (*) >90 (mL/min)   GLUCOSE, CAPILLARY     Status: Abnormal   Collection Time   03/23/11  6:35 AM      Component Value Range Comment   Glucose-Capillary 204 (*) 70 - 99 (mg/dL)   HEPARIN LEVEL (UNFRACTIONATED)     Status: Normal   Collection Time   03/23/11 10:00 AM      Component Value Range Comment   Heparin Unfractionated 0.48  0.30 - 0.70 (IU/mL)   IRON AND TIBC     Status: Normal   Collection Time   03/23/11 10:00 AM      Component Value Range Comment   Iron 65  42 - 135 (ug/dL)    TIBC 096  045 - 409 (ug/dL)    Saturation Ratios 26  20 - 55 (%)    UIBC 182  125 - 400 (ug/dL)   GLUCOSE, CAPILLARY     Status: Abnormal   Collection Time   03/23/11  2:49 PM      Component Value Range Comment   Glucose-Capillary 172 (*) 70 - 99 (mg/dL)    Comment 1 Notify RN      Comment 2 Documented in Chart       Disposition:  Follow-up Information    Follow up with CROITORU,MIHAI. (office will call)    Contact information:   [address] [place] [address] [phone]          Discharge Medications:  Current Discharge Medication List    START taking these medications   Details  acetaminophen (TYLENOL) 325 MG tablet Take 2 tablets (650 mg total) by mouth every 4 (four) hours as needed. Qty: 30 tablet    hydrALAZINE (APRESOLINE) 50 MG tablet Take 1 tablet (50 mg total) by mouth every 8 (eight) hours. Qty: 100 tablet, Refills: 5    insulin glargine (LANTUS) 100 UNIT/ML injection Inject 15 Units into the skin every morning. Qty: 10 mL, Refills: 5    isosorbide dinitrate (ISORDIL TITRADOSE) 5 MG tablet Take 2 tablets (10 mg total) by mouth 3 (three) times  daily. Qty: 100 tablet, Refills: 5    metoprolol (LOPRESSOR) 50 MG tablet Take 1 tablet (50 mg total) by mouth 2 (two) times daily. Qty: 60 tablet, Refills: 5    nicotine (NICODERM CQ - DOSED IN MG/24 HOURS) 21 mg/24hr patch Place 21 patches onto the skin daily. Qty: 28 patch    rosuvastatin (CRESTOR) 10 MG tablet Take 1 tablet (10 mg total) by mouth daily. Qty: 30 tablet, Refills: 5      CONTINUE these medications which have NOT CHANGED   Details  aspirin 81 MG chewable tablet Chew 81 mg by mouth daily.      metFORMIN (GLUCOPHAGE) 500 MG tablet Take 500 mg by mouth 2 (two) times daily  with a meal.      nitroGLYCERIN (NITROSTAT) 0.4 MG SL tablet Place 0.4 mg under the tongue once.        STOP taking these medications     atenolol (TENORMIN) 25 MG tablet      lisinopril-hydrochlorothiazide (PRINZIDE,ZESTORETIC) 20-12.5 MG per tablet         Duration of Discharge Encounter: Greater than 30 minutes including physician time.  Signed, Corine Shelter PA-C 03/23/2011 5:53 PM  I saw Mr. Giraldo prior to his discharge.  I reviewed his symptoms and his Myoview results - likely low risk with "possible mild ischemia" in a pt with known CAD-CABG & ICM.  See Dr. Hazle Coca note from yesterday re: unlikely ACS presentation. He is ready for discharge & f/u @ [facility] with Dr. Royann Shivers.   Marykay Lex, M.D., M.S. [facility] [address] [place], [place]  [address]  [phone]  03/23/2011 7:10 PM

## 2011-03-23 NOTE — Progress Notes (Signed)
ANTICOAGULATION CONSULT NOTE - Follow Up Consult  Pharmacy Consult for heparin Indication: chest pain/ACS  No Known Allergies  Patient Measurements: Height: 5\' 11"  (180.3 cm) Weight: 204 lb 6.4 oz (92.715 kg) IBW/kg (Calculated) : 75.3  Adjusted Body Weight: 92.7 kg  Vital Signs: Temp: 98.6 F (37 C) (11/27 2144) Temp src: Oral (11/27 2144) BP: 153/93 mmHg (11/27 2144) Pulse Rate: 74  (11/27 2144)  Labs:  Basename 03/23/11 0244 03/22/11 1659 03/22/11 0555 03/21/11 0158 03/20/11 0700  HGB 10.8* -- 9.9* -- --  HCT 32.2* -- 29.6* 31.8* --  PLT 99* -- 93* 109* --  APTT -- -- -- -- --  LABPROT -- -- -- -- --  INR -- -- -- -- --  HEPARINUNFRC 0.24* 0.14* 0.29* -- --  CREATININE 2.43* -- 2.58* 2.81* --  CKTOTAL -- -- -- -- 119  CKMB -- -- -- -- 6.0*  TROPONINI -- -- -- -- 0.60*   Estimated Creatinine Clearance: 44.2 ml/min (by C-G formula based on Cr of 2.43).  Medications:  Scheduled:    . aspirin EC  325 mg Oral Daily  . bd getting started take home kit  1 kit Other Once  . heparin  2,500 Units Intravenous Once  . hydrALAZINE  25 mg Oral Q8H  . insulin aspart  0-20 Units Subcutaneous TID WC  . insulin glargine  10 Units Subcutaneous Q0700  . levalbuterol  0.63 mg Nebulization TID  . metoprolol tartrate  50 mg Oral BID  . nicotine  21 mg Transdermal Daily  . rosuvastatin  10 mg Oral Daily  . DISCONTD: bd getting started take home kit  1 kit Other Once  . DISCONTD: insulin aspart  0-15 Units Subcutaneous TID WC  . DISCONTD: metoprolol tartrate  25 mg Oral BID   Infusions:    . sodium chloride 10 mL/hr at 03/22/11 1900  . heparin 1,650 Units/hr (03/22/11 2004)  . nitroGLYCERIN 5 mcg/min (03/22/11 1900)   Assessment: 45yo male remains subtherapeutic on heparin despite rate increase though level is increasing.  Stress test scheduled today.  Goal of Therapy:  Heparin level 0.3-0.7 units/ml   Plan:  Will increase heparin gtt by ~2 units/kg/hr to 1900 units/hr  and check level in 6hr.  Colleen Can PharmD BCPS 03/23/2011,4:02 AM

## 2011-03-23 NOTE — Progress Notes (Signed)
Clinical Social Worker followed up with financial counselor who spoke with patient. CSW will sign off for now as social work intervention is no longer needed. Please consult [place] again if new needs arises.   Rozetta Nunnery MSW, [employer] [phone]

## 2011-05-09 DIAGNOSIS — I739 Peripheral vascular disease, unspecified: Secondary | ICD-10-CM

## 2011-05-09 HISTORY — DX: Peripheral vascular disease, unspecified: I73.9

## 2011-07-05 DIAGNOSIS — I2510 Atherosclerotic heart disease of native coronary artery without angina pectoris: Secondary | ICD-10-CM

## 2011-07-05 HISTORY — DX: Atherosclerotic heart disease of native coronary artery without angina pectoris: I25.10

## 2012-02-16 DIAGNOSIS — Z0279 Encounter for issue of other medical certificate: Secondary | ICD-10-CM

## 2012-10-08 ENCOUNTER — Telehealth: Payer: Self-pay | Admitting: Cardiovascular Disease

## 2012-10-08 NOTE — Telephone Encounter (Signed)
Ins will no longer pay for his Isosorbide-What can he take now?

## 2012-10-08 NOTE — Telephone Encounter (Signed)
Isosorbide mononitrate 30 mg twice daily is an acceptable alternative

## 2012-10-08 NOTE — Telephone Encounter (Signed)
Returned call.  Pt stated Isosorbide Dinatrate is not covered by his insurance anymore and it costs >$100.  Pt wants to know if he needs to be on something else.  Stated he has been out x 3 weeks.  Denied CP and agreed to call back with any symptoms.  Informed Dr. Salena Saner will be notified.  Pt verbalized understanding and agreed w/ plan.  Message forwarded to Dr. Royann Shivers.  Paper chart# [id number] on Dr. Renaye Rakers cart.

## 2012-10-09 MED ORDER — ISOSORBIDE MONONITRATE ER 30 MG PO TB24: 30.0000 mg | ORAL_TABLET | Freq: Two times a day (BID) | ORAL | Status: DC

## 2012-10-09 NOTE — Telephone Encounter (Signed)
Returned call and informed pt per instructions by MD/PA.  Pt verbalized understanding and agreed w/ plan.  Rx sent to pharmacy for 90-day supply per pt request.

## 2012-11-08 ENCOUNTER — Encounter: Payer: Self-pay | Admitting: Cardiovascular Disease

## 2012-11-09 ENCOUNTER — Ambulatory Visit: Payer: Medicaid Other | Admitting: Cardiovascular Disease

## 2012-11-19 ENCOUNTER — Encounter: Payer: Self-pay | Admitting: Cardiovascular Disease

## 2012-11-19 ENCOUNTER — Ambulatory Visit (INDEPENDENT_AMBULATORY_CARE_PROVIDER_SITE_OTHER): Payer: Medicare Other | Admitting: Cardiovascular Disease

## 2012-11-19 VITALS — BP 130/62 | HR 55 | Resp 16 | Ht 71.0 in | Wt 201.6 lb

## 2012-11-19 DIAGNOSIS — I739 Peripheral vascular disease, unspecified: Secondary | ICD-10-CM

## 2012-11-19 DIAGNOSIS — I2589 Other forms of chronic ischemic heart disease: Secondary | ICD-10-CM

## 2012-11-19 DIAGNOSIS — F17200 Nicotine dependence, unspecified, uncomplicated: Secondary | ICD-10-CM

## 2012-11-19 DIAGNOSIS — Z720 Tobacco use: Secondary | ICD-10-CM

## 2012-11-19 DIAGNOSIS — E119 Type 2 diabetes mellitus without complications: Secondary | ICD-10-CM

## 2012-11-19 DIAGNOSIS — I10 Essential (primary) hypertension: Secondary | ICD-10-CM

## 2012-11-19 DIAGNOSIS — I255 Ischemic cardiomyopathy: Secondary | ICD-10-CM

## 2012-11-19 DIAGNOSIS — I2510 Atherosclerotic heart disease of native coronary artery without angina pectoris: Secondary | ICD-10-CM

## 2012-11-19 DIAGNOSIS — E785 Hyperlipidemia, unspecified: Secondary | ICD-10-CM

## 2012-11-19 DIAGNOSIS — I5042 Chronic combined systolic (congestive) and diastolic (congestive) heart failure: Secondary | ICD-10-CM

## 2012-11-19 DIAGNOSIS — N184 Chronic kidney disease, stage 4 (severe): Secondary | ICD-10-CM

## 2012-11-19 DIAGNOSIS — I509 Heart failure, unspecified: Secondary | ICD-10-CM

## 2012-11-19 NOTE — Patient Instructions (Addendum)
Your physician recommends that you schedule a follow-up appointment in: 6 months  Continue current medications.

## 2012-11-20 ENCOUNTER — Encounter: Payer: Self-pay | Admitting: Cardiovascular Disease

## 2012-11-20 DIAGNOSIS — I739 Peripheral vascular disease, unspecified: Secondary | ICD-10-CM | POA: Insufficient documentation

## 2012-11-20 NOTE — Assessment & Plan Note (Signed)
Controlled. Avoid ACE inhibitors, angiotensin receptor blockers, diuretics secondary to advanced kidney disease

## 2012-11-20 NOTE — Assessment & Plan Note (Addendum)
On statin therapy,re ports labs were recently checked by Dr. Elyn Peers. Will retrieve these.

## 2012-11-20 NOTE — Assessment & Plan Note (Signed)
He has significant (lifestyle limiting) intermittent claudication) but the pattern has not changed from last year. In general 2013 his lower showed a duplex study showed ABI of 0.74 on the right and 0.57 on the left. The most significant lesions appeared to be greater than 60% stenosis in the mid distal right SFA where there was two-vessel runoff (occluded anterior tibial) and total occlusion of the left mid distal SFA, again with two-vessel runoff (occluded anterior tibial). Although revascularization may be possible, he again declines any angiography based procedure. CO2 angiography is a consideration, but would still be followed by some degree of contrast administration for PTA. He would prefer conservative management. Discussed importance of daily walking to the limits of pain to stimulate formation of collateral vessels. We also reviewed the fact that smoking cigarettes will inhibit development of collaterals and increases risk of limb threatening ischemia.  

## 2012-11-20 NOTE — Assessment & Plan Note (Signed)
LV systolic function has improved to the point that he does not require ICD therapy at this time. This may become a consideration in the future. If so he should receive a CRT-D device since his EKG shows left bundle branch block with a QRS duration of 134 ms. At this junction he does not have sufficient symptoms of congestive heart failure or low enough yet to justify device therapy.

## 2012-11-20 NOTE — Assessment & Plan Note (Signed)
He appears to be clinically euvolemic, NYHA functional class II. Diuretics to be used judiciously secondary to chronic kidney disease.

## 2012-11-20 NOTE — Assessment & Plan Note (Addendum)
Status post CABG x3 (LIMA to LAD, SVG to circumflex OM, SVG to PDA) Dr. [name] 2010. He had a small non-ST segment elevation myocardial infarction in November 2012. At that presentation: primarily with congestive heart failure and his ejection fraction was 25-30%. Note that before his bypass surgery ejection fraction was only 10%. He had been noncompliant with his medications and after restarting these reevaluation of left ventricular ejection fraction showed a value of approximately 40%. Heart failure has been relatively easy to compensate. Coronary angiography was offered since it was felt that he would require further revascularization. He declined due to the risk of contrast nephrotoxicity and risk of needing renal replacement therapy. 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 He is limited more by intermittent claudication then by chest pain. At this point there are no plans to change his antianginal regimen. Ranolazine is an option if necessary in the future.

## 2012-11-20 NOTE — Assessment & Plan Note (Signed)
Estimated GFR 25-30 mL per minute

## 2012-11-20 NOTE — Progress Notes (Signed)
Patient ID: [name], male   DOB: 1966/04/04, 47 y.o.   MRN: [id number]     Reason for office visit CHF, CAD, PVD  [name] has extensive atherosclerotic disease involving the coronary circulation in the lower charities. He initially presented with ischemic cardiomyopathy, EF of 10-15%, and had bypass surgery in 2010. He was then lost to followup and was noncompliant with medications. Preterm congestive heart failure in late 2012 and a small increase in troponin levels. She also has advanced chronic kidney disease and refused to undergo any contrast based procedures due to the risk of possible kidney failure. He states that under no circumstances would he ever want to go on hemodialysis. "He "would rather die".  He also has severe arterial disease in his legs with intermittent claudication.  [name] has not had any change in his clinical complaints from his last visit roughly one year ago. He is mostly limited by intermittent claudication. When he walks has to stop roughly every 300 feet. Nevertheless he manages to walk half a mile a day everyday. It takes him about 45 minutes to achieve this. His symptoms are highly consistent with bilateral intermittent claudication, slightly worse in the left lower shunting on the right. Simultaneously with calf pain he also develops mild chest tightness. Likewise this is immediately relieved with rest. The pattern has not changed from last year. He denies shortness of breath. He has not had problems with edema orthopnea present nocturnal dyspnea. He has not had palpitations or syncope.  Unfortunately he continues to smoke cigarettes. Other members of his household do so as well.  He reports that his diabetes control has been excellent and that his lipid parameters have been recently checked by Dr. Elyn Peers. I do not have these results at this time.   No Known Allergies  Current Outpatient Prescriptions  Medication Sig Dispense Refill  . aspirin 81 MG  chewable tablet Chew 81 mg by mouth daily.       . calcium carbonate (TUMS EX) 750 MG chewable tablet Chew 1 tablet by mouth as needed for heartburn.      . nitroGLYCERIN (NITROSTAT) 0.4 MG SL tablet Place 0.4 mg under the tongue once.        . furosemide (LASIX) 40 MG tablet Take 40 mg by mouth as needed.      Marland Kitchen glipiZIDE (GLUCOTROL XL) 5 MG 24 hr tablet Take 5 mg by mouth daily.      . hydrALAZINE (APRESOLINE) 50 MG tablet Take 50 mg by mouth every 8 (eight) hours.      . insulin glargine (LANTUS) 100 UNIT/ML injection Inject 15 Units into the skin every morning.      . isosorbide mononitrate (IMDUR) 30 MG 24 hr tablet Take 1 tablet (30 mg total) by mouth 2 (two) times daily.  180 tablet  1  . metoprolol (LOPRESSOR) 50 MG tablet Take 50 mg by mouth 2 (two) times daily.      . pravastatin (PRAVACHOL) 40 MG tablet Take 40 mg by mouth at bedtime.       No current facility-administered medications for this visit.    Past Medical History  Diagnosis Date  . Coronary artery disease 07/05/2011    2D ECHO - EF ~40%, moderate concentric LV hypertrophy, LA moderately dilated, mild-moderate septal and inferior wall hypokinesis  . Hypertension   . CHF (congestive heart failure)   . Diabetes mellitus     Insulin dependent  . Cardiomyopathy   . Chronic kidney  disease (CKD), stage IV (severe)   . Claudication 05/09/2011    Right and left anterior tibial arteries and Left SFA-occluded; Right CIA-<50% diameter reduction; Right Deep Profunda-70-99% diameter reduction; Right SFA->60% diameter reduction; Right Distal Popliteal/Tibial Artery- >60% diameter reduction; Left CFA and Profunda- >50% diameter reduction  . 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 Cardiac  catheterization  05/07/2008    CABG  . Coronary artery bypass graft  06/2008    x3    No family history on file.  History   Social History  . Marital Status: Single    Spouse Name: N/A    Number of Children: N/A  . Years of Education: N/A   Occupational History  . Not on file.   Social History Main Topics  . Smoking status: Current Every Day Smoker -- 1.00 packs/day for 25 years    Types: Cigarettes  . Smokeless tobacco: Not on file  . Alcohol Use: Yes     Comment: occas.  . Drug Use: Yes    Special: Marijuana  . Sexually Active: Yes   Other Topics Concern  . Not on file   Social History Narrative  . No narrative on file  or with exertion  Review of systems: The patient specifically denies any chest pain at rest, dyspnea at rest , orthopnea, paroxysmal nocturnal dyspnea, syncope, palpitations, focal neurological deficits,  lower extremity edema, unexplained weight gain, cough, hemoptysis and he has rare wheezing.  The patient also denies abdominal pain, nausea, vomiting, dysphagia, diarrhea, constipation, polyuria, polydipsia, dysuria, hematuria, frequency, urgency, abnormal bleeding or bruising, fever, chills, unexpected weight changes, mood swings, change in skin or hair texture, change in voice quality, auditory or visual problems, allergic reactions or rashes, new musculoskeletal complaints other than usual "aches and pains".   PHYSICAL EXAM BP 130/62  Pulse 55  Resp 16  Ht 5\' 11"  (1.803 m)  Wt 201 lb 9.6 oz (91.445 kg)  BMI 28.13 kg/m2  General: Alert, oriented x3, no distress Head: no evidence of trauma, PERRL, EOMI, no exophtalmos or lid lag, no myxedema, no xanthelasma; normal ears, nose and oropharynx Neck: normal jugular venous pulsations and no hepatojugular reflux; brisk carotid pulses without delay and no carotid bruits Chest: clear to auscultation, no signs of consolidation by percussion or palpation, normal fremitus, symmetrical and full respiratory  excursions; healed sternotomy Cardiovascular: normal position and quality of the apical impulse, regular rhythm, normal first and second heart sounds, no murmurs, rubs or gallops Abdomen: no tenderness or distention, no masses by palpation, no abnormal pulsatility, normal bowel sounds, no hepatosplenomegaly Extremities: no clubbing, cyanosis or edema; 2+ radial, ulnar and brachial pulses bilaterally; 2+ right femoral, posterior tibial and dorsalis pedis pulses; 2+ left femoral, posterior tibial and dorsalis pedis pulses; bilateral subclavian and femoral bruits Neurological: grossly nonfocal   EKG: Sinus rhythm,  LBBB with QRS 134 ms and left axis deviation  Lipid Panel     Component Value Date/Time   CHOL 232* 03/20/2011 0700   TRIG 160* 03/20/2011 0700   HDL 39* 03/20/2011 0700   CHOLHDL 5.9 03/20/2011 0700   VLDL 32 03/20/2011 0700   LDLCALC 161* 03/20/2011 0700    BMET    Component Value Date/Time   NA 137 03/23/2011 0244  K 4.0 03/23/2011 0244   CL 107 03/23/2011 0244   CO2 21 03/23/2011 0244   GLUCOSE 221* 03/23/2011 0244   BUN 27* 03/23/2011 0244   CREATININE 2.43* 03/23/2011 0244   CALCIUM 8.6 03/23/2011 0244   GFRNONAA 30* 03/23/2011 0244   GFRAA 35* 03/23/2011 0244     ASSESSMENT AND PLAN Ischemic dilated cardiomyopathy LV systolic function has improved to the point that he does not require ICD therapy at this time. This may become a consideration in the future. If so he should receive a CRT-D device since his EKG shows left bundle branch block with a QRS duration of 134 ms. At this junction he does not have sufficient symptoms of congestive heart failure or low enough yet to justify device therapy.  CHF (congestive heart failure): Systolic/ diastolic He appears to be clinically euvolemic, NYHA functional class II. Diuretics to be used judiciously secondary to chronic kidney disease.  CAD (coronary artery disease) Status post CABG x3 (LIMA to LAD, SVG to  circumflex OM, SVG to PDA) Dr. [name] 2010. He had a small non-ST segment elevation myocardial infarction in November 2012. At that presentation: primarily with congestive heart failure and his ejection fraction was 25-30%. Note that before his bypass surgery ejection fraction was only 10%. He had been noncompliant with his medications and after restarting these reevaluation of left ventricular ejection fraction showed a value of approximately 40%. Heart failure has been relatively easy to compensate. Coronary angiography was offered since it was felt that he would require further revascularization. He declined due to the risk of contrast nephrotoxicity and risk of needing renal replacement therapy. 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 He is limited more by intermittent claudication then by chest pain. At this point there are no plans to change his antianginal regimen. Ranolazine is an option if necessary in the future.  HTN (hypertension) Controlled. Avoid ACE inhibitors, angiotensin receptor blockers, diuretics secondary to advanced kidney disease  Hyperlipidemia On statin therapy,re ports labs were recently checked by Dr. Elyn Peers. Will retrieve these.  Chronic renal insufficiency Estimated GFR 25-30 mL per minute  DM2 (diabetes mellitus, type 2)    PAD (peripheral artery disease) He has significant (lifestyle limiting) intermittent claudication) but the pattern has not changed from last year. In general 2013 his lower showed a duplex study showed ABI of 0.74 on the right and 0.57 on the left. The most significant lesions appeared to be greater than 60% stenosis in the mid distal right SFA where there was two-vessel runoff (occluded anterior tibial) and total occlusion of the left mid distal SFA, again with two-vessel runoff (occluded anterior tibial). Although revascularization may be possible, he again declines any angiography based procedure. CO2 angiography is a consideration, but would still be  followed by some degree of contrast administration for PTA. He would prefer conservative management. Discussed importance of daily walking to the limits of pain to stimulate formation of collateral vessels. We also reviewed the fact that smoking cigarettes will inhibit development of collaterals and increases risk of limb threatening ischemia.   Orders Placed This Encounter  Procedures  . EKG 12-Lead   No orders of the defined types were placed in this encounter.    Junious Silk, MD, [facility] [phone] office [phone] pager

## 2013-03-13 ENCOUNTER — Other Ambulatory Visit: Payer: Self-pay | Admitting: *Deleted

## 2013-03-13 MED ORDER — METOPROLOL TARTRATE 50 MG PO TABS: 50.0000 mg | ORAL_TABLET | Freq: Two times a day (BID) | ORAL | Status: DC

## 2013-05-09 ENCOUNTER — Ambulatory Visit (INDEPENDENT_AMBULATORY_CARE_PROVIDER_SITE_OTHER): Payer: Medicare Other | Admitting: Cardiovascular Disease

## 2013-05-09 ENCOUNTER — Encounter: Payer: Self-pay | Admitting: Cardiovascular Disease

## 2013-05-09 VITALS — BP 100/60 | HR 61 | Ht 71.0 in | Wt 196.2 lb

## 2013-05-09 DIAGNOSIS — E785 Hyperlipidemia, unspecified: Secondary | ICD-10-CM

## 2013-05-09 DIAGNOSIS — Z720 Tobacco use: Secondary | ICD-10-CM

## 2013-05-09 DIAGNOSIS — Z951 Presence of aortocoronary bypass graft: Secondary | ICD-10-CM

## 2013-05-09 DIAGNOSIS — F17200 Nicotine dependence, unspecified, uncomplicated: Secondary | ICD-10-CM

## 2013-05-09 DIAGNOSIS — Z79899 Other long term (current) drug therapy: Secondary | ICD-10-CM

## 2013-05-09 DIAGNOSIS — I739 Peripheral vascular disease, unspecified: Secondary | ICD-10-CM

## 2013-05-09 DIAGNOSIS — I10 Essential (primary) hypertension: Secondary | ICD-10-CM

## 2013-05-09 DIAGNOSIS — I509 Heart failure, unspecified: Secondary | ICD-10-CM

## 2013-05-09 NOTE — Patient Instructions (Signed)
Your physician recommends that you schedule a follow-up appointment in: 6 months with Dr Hadassah Paisriotour  NEED TO GET FASTING LAB WORK

## 2013-05-10 ENCOUNTER — Encounter: Payer: Self-pay | Admitting: Cardiovascular Disease

## 2013-05-18 NOTE — Assessment & Plan Note (Signed)
He has significant (lifestyle limiting) intermittent claudication) but the pattern has not changed from last year. In general 2013 his lower showed a duplex study showed ABI of 0.74 on the right and 0.57 on the left. The most significant lesions appeared to be greater than 60% stenosis in the mid distal right SFA where there was two-vessel runoff (occluded anterior tibial) and total occlusion of the left mid distal SFA, again with two-vessel runoff (occluded anterior tibial). Although revascularization may be possible, he again declines any angiography based procedure. CO2 angiography is a consideration, but would still be followed by some degree of contrast administration for PTA. He would prefer conservative management. Discussed importance of daily walking to the limits of pain to stimulate formation of collateral vessels. We also reviewed the fact that smoking cigarettes will inhibit development of collaterals and increases risk of limb threatening ischemia.

## 2013-05-18 NOTE — Assessment & Plan Note (Signed)
He seems to have exertional angina with relatively low threshold, but does not have symptoms of acute coronary insufficiency. Unfortunately he does not want to risk any chance of needing hemodialysis and therefore coronary angiography and percutaneous revascularization is not an option. Further antianginal therapy is limited by his low blood pressure. Ranexa is of questionable risk/benefit considering his long QTC and renal insufficiency

## 2013-05-18 NOTE — Progress Notes (Signed)
Patient ID: [name], male   DOB: Oct 06, 1965, 48 y.o.   MRN: [id number]      Reason for office visit Followup CAD, CHF, PhD  Mr. Baca has not had any major changes in his health since his last appointment. He has severe complications of vascular disease including moderately depressed left ventricular systolic function due to ischemic cardiomyopathy and lifestyle limiting claudication due to PAD. Treatment for both his coronary and peripheral vascular problems has been limited by severe chronic kidney disease, stage 4-5. He still refuses to even consider hemodialysis. He has a nephrologist in [facility] that has encouraged him repeatedly to reevaluate this. He never wants to go on hemodialysis and he states this in very certain terms.   He continues to smoke half a pack per day. He states that he wants to quit but I have reason to doubt his commitment.  From a heart failure standpoint he has been well compensated. Remarkably he has only required furosemide 3 times in the last 3 months. Has occasional ankle edema. He denies shortness of breath and this may be because intermittent claudication limits his walking to only 300 feet. He sometimes gets angina at about the same level of exertion.  He no longer requires insulin therapy, probably a reflection of worsening renal function.   No Known Allergies  Current Outpatient Prescriptions  Medication Sig Dispense Refill  . aspirin 81 MG chewable tablet Chew 81 mg by mouth daily.       . calcium carbonate (TUMS EX) 750 MG chewable tablet Chew 1 tablet by mouth as needed for heartburn.      . furosemide (LASIX) 40 MG tablet Take 40 mg by mouth as needed.      Marland Kitchen glipiZIDE (GLUCOTROL XL) 5 MG 24 hr tablet Take 5 mg by mouth daily.      . hydrALAZINE (APRESOLINE) 50 MG tablet Take 50 mg by mouth every 8 (eight) hours.      . insulin glargine (LANTUS) 100 UNIT/ML injection Inject 15 Units into the skin as needed.       . metoprolol (LOPRESSOR) 50 MG  tablet Take 1 tablet (50 mg total) by mouth 2 (two) times daily.  60 tablet  9  . nitroGLYCERIN (NITROSTAT) 0.4 MG SL tablet Place 0.4 mg under the tongue once.        . pantoprazole (PROTONIX) 40 MG tablet Take 40 mg by mouth daily.       . pravastatin (PRAVACHOL) 40 MG tablet Take 40 mg by mouth at bedtime.       No current facility-administered medications for this visit.    Past Medical History  Diagnosis Date  . Coronary artery disease 07/05/2011    2D ECHO - EF ~40%, moderate concentric LV hypertrophy, LA moderately dilated, mild-moderate septal and inferior wall hypokinesis  . Hypertension   . CHF (congestive heart failure)   . Diabetes mellitus     Insulin dependent  . Cardiomyopathy   . Chronic kidney disease (CKD), stage IV (severe)   . Claudication 05/09/2011    Right and left anterior tibial arteries and Left SFA-occluded; Right CIA-<50% diameter reduction; Right Deep Profunda-70-99% diameter reduction; Right SFA->60% diameter reduction; Right Distal Popliteal/Tibial Artery- >60% diameter reduction; Left CFA and Profunda- >50% diameter reduction  . S/P CABG (coronary artery bypass graft) 03/23/2011`    STRESS TEST - LV EF 29%, mild reversible within the apical segment of the anterior and anterolateral wall, small infarct in the inferolateral wall, global hypokinesia  Past Surgical History  Procedure Laterality Date  . Cardiac catheterization  05/07/2008    CABG  . Coronary artery bypass graft  06/2008    x3    No family history on file.  History   Social History  . Marital Status: Single    Spouse Name: N/A    Number of Children: N/A  . Years of Education: N/A   Occupational History  . Not on file.   Social History Main Topics  . Smoking status: Current Every Day Smoker -- 0.50 packs/day for 25 years    Types: Cigarettes  . Smokeless tobacco: Not on file  . Alcohol Use: Yes     Comment: occas.  . Drug Use: Yes    Special: Marijuana  . Sexual Activity:  Yes   Other Topics Concern  . Not on file   Social History Narrative  . No narrative on file    Review of systems:  severe intermittent claudication and exertional angina, CCS class 2-3 . No complaints of shortness of breath at rest or with exertion. Denies neurological complaints. Denies abdominal pain, gastrointestinal bleeding, nausea or vomiting, dysphagia, diarrhea or constipation, pruritus, change in urine output, major weight changes, mood problems, skin rashes or easy bleeding/bruising, syncope  PHYSICAL EXAM BP 100/60  Pulse 61  Ht 5' 11"  (1.803 m)  Wt 88.996 kg (196 lb 3.2 oz)  BMI 27.38 kg/m2 General: Alert, oriented x3, no distress ; face with a little puffiness especially around the eyes Head: no evidence of trauma, PERRL, EOMI, no exophtalmos or lid lag, no myxedema, no xanthelasma; normal ears, nose and oropharynx  Neck: normal jugular venous pulsations and no hepatojugular reflux; brisk carotid pulses without delay and no carotid bruits  Chest: clear to auscultation, no signs of consolidation by percussion or palpation, normal fremitus, symmetrical and full respiratory excursions; healed sternotomy  Cardiovascular: normal position and quality of the apical impulse, regular rhythm, normal first and second heart sounds, no murmurs, rubs or gallops  Abdomen: no tenderness or distention, no masses by palpation, no abnormal pulsatility, normal bowel sounds, no hepatosplenomegaly  Extremities: no clubbing, cyanosis or edema; 2+ radial, ulnar and brachial pulses bilaterally; 2+ right femoral, thready posterior tibial and dorsalis pedis pulses; 2+ left femoral, thready posterior tibial and dorsalis pedis pulses; bilateral subclavian and femoral bruits. Skin is warm and color is normal and there is normal capillary refill. Neurological: grossly nonfocal   EKG:  normal sinus rhythm, old anterior MI with Q waves across the precordium, his left bundle branch block is not fully present  today (QRS 118 ms), QTC 497 ms  Lipid Panel     Component Value Date/Time   CHOL 232* 03/20/2011 0700   TRIG 160* 03/20/2011 0700   HDL 39* 03/20/2011 0700   CHOLHDL 5.9 03/20/2011 0700   VLDL 32 03/20/2011 0700   LDLCALC 161* 03/20/2011 0700    BMET    Component Value Date/Time   NA 137 03/23/2011 0244   K 4.0 03/23/2011 0244   CL 107 03/23/2011 0244   CO2 21 03/23/2011 0244   GLUCOSE 221* 03/23/2011 0244   BUN 27* 03/23/2011 0244   CREATININE 2.43* 03/23/2011 0244   CALCIUM 8.6 03/23/2011 0244   GFRNONAA 30* 03/23/2011 0244   GFRAA 35* 03/23/2011 0244     ASSESSMENT AND PLAN  S/P CABG x 3:  March 2010: left internal mammary artery to distal left anterior descending, SVG to Circ marginal vessel, SVG to PDA He seems to have  exertional angina with relatively low threshold, but does not have symptoms of acute coronary insufficiency. Unfortunately he does not want to risk any chance of needing hemodialysis and therefore coronary angiography and percutaneous revascularization is not an option. Further antianginal therapy is limited by his low blood pressure. Ranexa is of questionable risk/benefit considering his long QTC and renal insufficiency  PAD (peripheral artery disease) He has significant (lifestyle limiting) intermittent claudication) but the pattern has not changed from last year. In general 2013 his lower showed a duplex study showed ABI of 0.74 on the right and 0.57 on the left. The most significant lesions appeared to be greater than 60% stenosis in the mid distal right SFA where there was two-vessel runoff (occluded anterior tibial) and total occlusion of the left mid distal SFA, again with two-vessel runoff (occluded anterior tibial). Although revascularization may be possible, he again declines any angiography based procedure. CO2 angiography is a consideration, but would still be followed by some degree of contrast administration for PTA. He would prefer conservative  management. Discussed importance of daily walking to the limits of pain to stimulate formation of collateral vessels. We also reviewed the fact that smoking cigarettes will inhibit development of collaterals and increases risk of limb threatening ischemia.   CHF (congestive heart failure): Systolic/ diastolic Symptoms of heart failure may be masked by intermittent claudication. He is not overtly hypervolemic. We discussed the fact that the treatment of renal insufficiency and heart failure has some contradictions. There is no way to use ACE inhibitors or angiotensin receptor blockers. His QRS complex is much narrower today. In the past he has had left bundle branch block with a QRS approaching 140 ms. If device therapy is ever considered, he should probably receive a resynchronization device. At this point his ejection fraction is almost 40% and ICD does not appear to be indicated.  Tobacco abuse I told him again that he needs to quit smoking. He says he knows that.  Hyperlipidemia It's time that we recheck his lipid profile on statin therapy   Patient Instructions  Your physician recommends that you schedule a follow-up appointment in: 6 months with Dr Penne Lash  NEED TO GET FASTING LAB WORK       Orders Placed This Encounter  Procedures  . Lipid Profile  . Comp Met (CMET)  . EKG 12-Lead   Meds ordered this encounter  Medications  . pantoprazole (PROTONIX) 40 MG tablet    Sig: Take 40 mg by mouth daily.     Holli Humbles, MD, [place] [phone] office [phone] pager

## 2013-05-18 NOTE — Assessment & Plan Note (Signed)
I told him again that he needs to quit smoking. He says he knows that.

## 2013-05-18 NOTE — Assessment & Plan Note (Signed)
It's time that we recheck his lipid profile on statin therapy

## 2013-05-18 NOTE — Assessment & Plan Note (Addendum)
Symptoms of heart failure may be masked by intermittent claudication. He is not overtly hypervolemic. We discussed the fact that the treatment of renal insufficiency and heart failure has some contradictions. There is no way to use ACE inhibitors or angiotensin receptor blockers. His QRS complex is much narrower today. In the past he has had left bundle branch block with a QRS approaching 140 ms. If device therapy is ever considered, he should probably receive a resynchronization device. At this point his ejection fraction is almost 40% and ICD does not appear to be indicated.

## 2013-08-19 ENCOUNTER — Other Ambulatory Visit: Payer: Self-pay

## 2013-08-19 MED ORDER — NITROGLYCERIN 0.4 MG SL SUBL: 0.4000 mg | SUBLINGUAL_TABLET | Freq: Once | SUBLINGUAL | Status: DC

## 2013-08-19 NOTE — Telephone Encounter (Signed)
Rx was sent to pharmacy electronically. 

## 2013-10-01 ENCOUNTER — Encounter: Payer: Self-pay | Admitting: Cardiovascular Disease

## 2013-10-01 ENCOUNTER — Ambulatory Visit (INDEPENDENT_AMBULATORY_CARE_PROVIDER_SITE_OTHER): Payer: Medicare Other | Admitting: Cardiovascular Disease

## 2013-10-01 VITALS — BP 152/78 | HR 61 | Ht 71.0 in | Wt 189.0 lb

## 2013-10-01 DIAGNOSIS — Z951 Presence of aortocoronary bypass graft: Secondary | ICD-10-CM

## 2013-10-01 DIAGNOSIS — I2510 Atherosclerotic heart disease of native coronary artery without angina pectoris: Secondary | ICD-10-CM

## 2013-10-01 DIAGNOSIS — I739 Peripheral vascular disease, unspecified: Secondary | ICD-10-CM

## 2013-10-01 DIAGNOSIS — I10 Essential (primary) hypertension: Secondary | ICD-10-CM

## 2013-10-01 DIAGNOSIS — E785 Hyperlipidemia, unspecified: Secondary | ICD-10-CM

## 2013-10-01 MED ORDER — NITROGLYCERIN 0.4 MG SL SUBL: 0.4000 mg | SUBLINGUAL_TABLET | Freq: Once | SUBLINGUAL | Status: AC

## 2013-10-01 NOTE — Assessment & Plan Note (Signed)
On statin therapy followed by his PCP 

## 2013-10-01 NOTE — Progress Notes (Signed)
10/01/2013 Felecia JanFrank Herbold   [birth year]  [id number]  Primary Physician Dema SeverinYORK,REGINA F, NP Primary Cardiologist: Runell GessJonathan J. Rielly Corlett MD Roseanne RenoFACP,FACC,FAHA, FSCAI   HPI:  Mr. [name] is a 48 year old married Caucasian male other 4 children patient of Dr. Erin Hearingroitoru's. He has been disabled since 2010. 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 At that time his ejection fraction was 10-15% and most recently up to 40% by 2-D echocardiogram in 2013. This on Cipro recently discontinued tobacco abuse, hypertension, diabetes, chronic renal insufficiency, hyperlipidemia and peripheral arterial disease. He apparently had a 2-D echo performed by private care physician. After review of the results he was told to get immediately to cardiologist for evaluation. We are in the process of obtaining those results. He denies chest pain or increasing dyspnea. Does have dependent edema which has not changed in severity.   Current Outpatient Prescriptions  Medication Sig Dispense Refill  . calcium carbonate (TUMS EX) 750 MG chewable tablet Chew 1 tablet by mouth as needed for heartburn.      . furosemide (LASIX) 40 MG tablet Take 40 mg by mouth as needed.      Marland Kitchen. glipiZIDE (GLUCOTROL XL) 5 MG 24 hr tablet Take 5 mg by mouth daily as needed.       . hydrALAZINE (APRESOLINE) 50 MG tablet Take 50 mg by mouth every 8 (eight) hours.      . insulin glargine (LANTUS) 100 UNIT/ML injection Inject 10 Units into the skin daily.       . isosorbide mononitrate (IMDUR) 30 MG 24 hr tablet Take 30 mg by mouth daily.      . metoprolol (LOPRESSOR) 50 MG tablet Take 1 tablet (50 mg total) by mouth 2 (two) times daily.  60 tablet  9  . nitroGLYCERIN (NITROSTAT) 0.4 MG SL tablet Place 1 tablet (0.4 mg total) under the tongue once.  25 tablet  5  . pantoprazole (PROTONIX) 40 MG tablet Take 40 mg by mouth daily.       . pravastatin (PRAVACHOL) 40 MG tablet Take 40 mg by mouth at bedtime.      .  promethazine-dextromethorphan (PROMETHAZINE-DM) 6.25-15 MG/5ML syrup Take 5 mLs by mouth 4 (four) times daily as needed for cough.      . sodium bicarbonate 650 MG tablet Take 650 mg by mouth 2 (two) times daily.       No current facility-administered medications for this visit.    No Known Allergies  History   Social History  . Marital Status: Single    Spouse Name: N/A    Number of Children: N/A  . Years of Education: N/A   Occupational History  . Not on file.   Social History Main Topics  . Smoking status: Former Smoker -- 0.50 packs/day for 25 years    Types: Cigarettes    Quit date: 09/03/2013  . Smokeless tobacco: Not on file  . Alcohol Use: Yes     Comment: occas.  . Drug Use: Yes    Special: Marijuana  . Sexual Activity: Yes   Other Topics Concern  . Not on file   Social History Narrative  . No narrative on file     Review of Systems: General: negative for chills, fever, night sweats or weight changes.  Cardiovascular: negative for chest pain, dyspnea on exertion, edema, orthopnea, palpitations, paroxysmal nocturnal dyspnea or shortness of breath Dermatological: negative for rash Respiratory: negative for cough or wheezing Urologic:  negative for hematuria Abdominal: negative for nausea, vomiting, diarrhea, bright red blood per rectum, melena, or hematemesis Neurologic: negative for visual changes, syncope, or dizziness All other systems reviewed and are otherwise negative except as noted above.    Blood pressure 152/78, pulse 61, height 5\' 11"  (1.803 m), weight 189 lb (85.73 kg).  General appearance: alert and no distress Neck: no adenopathy, no carotid bruit, no JVD, supple, symmetrical, trachea midline and thyroid not enlarged, symmetric, no tenderness/mass/nodules Lungs: clear to auscultation bilaterally Heart: regular rate and rhythm, S1, S2 normal, no murmur, click, rub or gallop Extremities: 2-3+ pitting edema  EKG normal sinus rhythm at 61with  poor R-wave progression and left intraventricular block  ASSESSMENT AND PLAN:   S/P CABG x 3:  March 2010: left internal mammary artery to distal left anterior descending, SVG to Circ marginal vessel, SVG to PDA Status post coronary artery bypass grafting x3 in March of 2010 with the LIMA to the distal LAD, vein graft to the marginal branch and PDA. 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 He has chronic class III congestive heart failure as was probably class 4-5 chronic kidney disease. Other problems include hypertension, diabetes, and recently discontinued tobacco abuse. He has peripheral arterial disease with severe claudication that is Lescol living and has had this documented and evaluated in our office with ABIs in the 0.7 range on the right and 0.6 range on the left. He apparently had a 2-D echo recently performed by primary care physician and was told to seek cardiology care urgently, and was added onto my schedule today. He denies chest pain. He has chronic dyspnea on exertion and chronic dependent edema which has not changed in severity. He does not eat salt. He takes furosemide on a as needed basis. I told him to take furosemide daily which was also recommended to him by his primary care physician, Dr. Elyn Peers. I'm going to obtain a copy of his 2-D echo and will arrange for him to see Dr. Royann Shivers back in followup.  PAD (peripheral artery disease) Severe PAD which is lifestyle limiting. He is however constrained by the inability to have a contrast study given his severe renal insufficiency and the potential for contrast nephropathy.  Hyperlipidemia On statin therapy followed by his PCP  HTN (hypertension) Well-controlled on appropriate medications      Runell Gess MD [facility], [facility] 10/01/2013 3:54 PM   NB review the 2-D echocardiogram performed 10/01/13 revealed an ejection  fraction of 25-30%. This represents moderate decline in his EF previously documented by echo 2 years ago.based on his laboratory him back to Dr. Royann Shivers for further evaluation and discussion about ICD therapy for primary prevention.

## 2013-10-01 NOTE — Patient Instructions (Addendum)
Your physician recommends that you schedule a follow-up appointment in: 1-2 weeks with Dr Royann Shivers  Dr Allyson Sabal will follow-up with you on an as needed basis.

## 2013-10-01 NOTE — Assessment & Plan Note (Signed)
Well-controlled on appropriate medications 

## 2013-10-01 NOTE — Assessment & Plan Note (Signed)
Severe PAD which is lifestyle limiting. He is however constrained by the inability to have a contrast study given his severe renal insufficiency and the potential for contrast nephropathy.

## 2013-10-01 NOTE — Assessment & Plan Note (Signed)
Status post coronary artery bypass grafting x3 in March of 2010 with the LIMA to the distal LAD, vein graft to the marginal branch and PDA. 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 He has chronic class III congestive heart failure as was probably class 4-5 chronic kidney disease. Other problems include hypertension, diabetes, and recently discontinued tobacco abuse. He has peripheral arterial disease with severe claudication that is Lescol living and has had this documented and evaluated in our office with ABIs in the 0.7 range on the right and 0.6 range on the left. He apparently had a 2-D echo recently performed by primary care physician and was told to seek cardiology care urgently, and was added onto my schedule today. He denies chest pain. He has chronic dyspnea on exertion and chronic dependent edema which has not changed in severity. He does not eat salt. He takes furosemide on a as needed basis. I told him to take furosemide daily which was also recommended to him by his primary care physician, Dr. Elyn Peers. I'm going to obtain a copy of his 2-D echo and will arrange for him to see Dr. Royann Shivers back in followup.

## 2013-10-08 ENCOUNTER — Telehealth: Payer: Self-pay | Admitting: Cardiovascular Disease

## 2013-10-08 ENCOUNTER — Encounter: Payer: Self-pay | Admitting: Cardiovascular Disease

## 2013-10-11 NOTE — Telephone Encounter (Signed)
Closed encounter °

## 2013-11-05 ENCOUNTER — Ambulatory Visit (INDEPENDENT_AMBULATORY_CARE_PROVIDER_SITE_OTHER): Payer: Medicare Other | Admitting: Cardiovascular Disease

## 2013-11-05 DIAGNOSIS — I2589 Other forms of chronic ischemic heart disease: Secondary | ICD-10-CM

## 2013-11-05 DIAGNOSIS — E785 Hyperlipidemia, unspecified: Secondary | ICD-10-CM

## 2013-11-07 ENCOUNTER — Encounter: Payer: Self-pay | Admitting: Cardiovascular Disease

## 2013-11-07 ENCOUNTER — Ambulatory Visit (INDEPENDENT_AMBULATORY_CARE_PROVIDER_SITE_OTHER): Payer: Medicare Other | Admitting: Cardiovascular Disease

## 2013-11-07 VITALS — BP 182/93 | HR 66 | Resp 16 | Ht 71.0 in | Wt 185.6 lb

## 2013-11-07 DIAGNOSIS — I10 Essential (primary) hypertension: Secondary | ICD-10-CM

## 2013-11-07 DIAGNOSIS — I2510 Atherosclerotic heart disease of native coronary artery without angina pectoris: Secondary | ICD-10-CM

## 2013-11-07 DIAGNOSIS — I739 Peripheral vascular disease, unspecified: Secondary | ICD-10-CM

## 2013-11-07 DIAGNOSIS — Z720 Tobacco use: Secondary | ICD-10-CM

## 2013-11-07 DIAGNOSIS — I25119 Atherosclerotic heart disease of native coronary artery with unspecified angina pectoris: Secondary | ICD-10-CM

## 2013-11-07 DIAGNOSIS — I5042 Chronic combined systolic (congestive) and diastolic (congestive) heart failure: Secondary | ICD-10-CM

## 2013-11-07 DIAGNOSIS — F17200 Nicotine dependence, unspecified, uncomplicated: Secondary | ICD-10-CM

## 2013-11-07 DIAGNOSIS — I509 Heart failure, unspecified: Secondary | ICD-10-CM

## 2013-11-07 DIAGNOSIS — Z951 Presence of aortocoronary bypass graft: Secondary | ICD-10-CM

## 2013-11-07 DIAGNOSIS — I209 Angina pectoris, unspecified: Secondary | ICD-10-CM

## 2013-11-07 MED ORDER — HYDRALAZINE HCL 50 MG PO TABS: 50.0000 mg | ORAL_TABLET | Freq: Three times a day (TID) | ORAL | Status: DC

## 2013-11-07 NOTE — Patient Instructions (Signed)
We will request the CD [facility] and call after Dr. Royann Shiversroitoru has reviewed.  Dr. Royann Shiversroitoru recommends that you schedule a follow-up appointment in: 3 months.

## 2013-11-09 ENCOUNTER — Encounter: Payer: Self-pay | Admitting: Cardiovascular Disease

## 2013-11-09 NOTE — Progress Notes (Signed)
Patient ID: [name], male   DOB: 09-29-1965, 48 y.o.   MRN: [id number]     Reason for office visit CAD, CHF, PAD  [name] has extensive atherosclerotic disease involving the coronary circulation and the lower extremities. He initially presented with ischemic cardiomyopathy, EF of 10-15%, and had bypass surgery in 2010 (3 bypasses: LIMA to LAD, SVG to RCA, SVG to OM). He was then lost to followup and was noncompliant with medications. Return to congestive heart failure in late 2012 and a small increase in troponin levels. His EF was 29% with inferolateral scar and apical and anterior ischemia. He also has advanced chronic kidney disease and refused to undergo any contrast based procedures due to the risk of possible kidney failure. He states that under no circumstances would he ever want to go on hemodialysis. "He "would rather die". 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 After restarting appropriate medical therapy for CAD and CHF, LV EF increased to 40% (residual inferior and septal hypokinesis) by echo in March 2013. He has done well from a functional status, NYHA class II. He is mostly limited by intermittent claudication and has to stop every 300 feet or so for leg pain. He continues to smoke.  An echocardi[facility] was performed at [facility] on June 9 and showed that his left ventricular ejection fraction was 25-30% with a restrictive filling pattern suggestive of elevated filling pressures. He was sent for an emergency evaluation by Dr. Allyson Sabal, but as far as I can tell has not had any change in his functional status. He had chest pain, clearly tenderness to palpation suggestive of costochondritis.  He has been unable to refill his hydralazine and his blood pressure is elevated today.  No Known Allergies  Current Outpatient Prescriptions  Medication Sig Dispense Refill  . calcium carbonate (TUMS EX) 750 MG chewable tablet  Chew 1 tablet by mouth as needed for heartburn.      . furosemide (LASIX) 40 MG tablet Take 40 mg by mouth as needed.      Marland Kitchen glipiZIDE (GLUCOTROL XL) 5 MG 24 hr tablet Take 5 mg by mouth daily as needed.       . hydrALAZINE (APRESOLINE) 50 MG tablet Take 1 tablet (50 mg total) by mouth every 8 (eight) hours.  90 tablet  6  . insulin glargine (LANTUS) 100 UNIT/ML injection Inject 10 Units into the skin daily.       . isosorbide mononitrate (IMDUR) 30 MG 24 hr tablet Take 30 mg by mouth daily.      . metoprolol (LOPRESSOR) 50 MG tablet Take 1 tablet (50 mg total) by mouth 2 (two) times daily.  60 tablet  9  . nitroGLYCERIN (NITROSTAT) 0.4 MG SL tablet Place 1 tablet (0.4 mg total) under the tongue once.  25 tablet  5  . pantoprazole (PROTONIX) 40 MG tablet Take 40 mg by mouth daily.       . pravastatin (PRAVACHOL) 40 MG tablet Take 40 mg by mouth at bedtime.      . sodium bicarbonate 650 MG tablet Take 650 mg by mouth 2 (two) times daily.       No current facility-administered medications for this visit.    Past Medical History  Diagnosis Date  . Coronary artery disease 07/05/2011    2D ECHO - EF ~40%, moderate concentric LV hypertrophy, LA moderately dilated, mild-moderate septal and inferior wall hypokinesis  . Hypertension   .  CHF (congestive heart failure)   . Diabetes mellitus     Insulin dependent  . Cardiomyopathy   . Chronic kidney disease (CKD), stage IV (severe)   . Claudication 05/09/2011    Right and left anterior tibial arteries and Left SFA-occluded; Right CIA-<50% diameter reduction; Right Deep Profunda-70-99% diameter reduction; Right SFA->60% diameter reduction; Right Distal Popliteal/Tibial Artery- >60% diameter reduction; Left CFA and Profunda- >50% diameter reduction  . S/P CABG (coronary artery bypass graft) 03/23/2011`    STRESS TEST - LV EF 29%, mild reversible within the apical segment of the anterior and anterolateral wall, small infarct in the inferolateral wall,  global hypokinesia    Past Surgical History  Procedure Laterality Date  . Cardiac catheterization  05/07/2008    CABG  . Coronary artery bypass graft  06/2008    x3    No family history on file.  History   Social History  . Marital Status: Single    Spouse Name: N/A    Number of Children: N/A  . Years of Education: N/A   Occupational History  . Not on file.   Social History Main Topics  . Smoking status: Former Smoker -- 0.50 packs/day for 25 years    Types: Cigarettes    Quit date: 09/03/2013  . Smokeless tobacco: Not on file  . Alcohol Use: Yes     Comment: occas.  . Drug Use: Yes    Special: Marijuana  . Sexual Activity: Yes   Other Topics Concern  . Not on file   Social History Narrative  . No narrative on file    Review of systems: severe intermittent claudication limited activity. No chest pain with exertion. Recent chest tenderness to palpation. . No complaints of shortness of breath at rest or with exertion. Denies neurological complaints. Denies abdominal pain, gastrointestinal bleeding, nausea or vomiting, dysphagia, diarrhea or constipation, pruritus, change in urine output, major weight changes, mood problems, skin rashes or easy bleeding/bruising, syncope   PHYSICAL EXAM BP 182/93  Pulse 66  Resp 16  Ht 5\' 11"  (1.803 m)  Wt 185 lb 9.6 oz (84.188 kg)  BMI 25.90 kg/m2 General: Alert, oriented x3, no distress ; face with a little puffiness especially around the eyes  Head: no evidence of trauma, PERRL, EOMI, no exophtalmos or lid lag, no myxedema, no xanthelasma; normal ears, nose and oropharynx  Neck: normal jugular venous pulsations and no hepatojugular reflux; brisk carotid pulses without delay and no carotid bruits  Chest: clear to auscultation, no signs of consolidation by percussion or palpation, normal fremitus, symmetrical and full respiratory excursions; healed sternotomy  Cardiovascular: normal position and quality of the apical impulse,  regular rhythm, normal first and second heart sounds, no murmurs, rubs or gallops  Abdomen: no tenderness or distention, no masses by palpation, no abnormal pulsatility, normal bowel sounds, no hepatosplenomegaly  Extremities: no clubbing, cyanosis or edema; 2+ radial, ulnar and brachial pulses bilaterally; 2+ right femoral, thready posterior tibial and dorsalis pedis pulses; 2+ left femoral, thready posterior tibial and dorsalis pedis pulses; bilateral subclavian and femoral bruits. Skin is warm and color is normal and there is normal capillary refill.  Neurological: grossly nonfocal    EKG: NSR, old anterior MI, and peak LBBB 116 ms  BMET    Component Value Date/Time   NA 137 03/23/2011 0244   K 4.0 03/23/2011 0244   CL 107 03/23/2011 0244   CO2 21 03/23/2011 0244   GLUCOSE 221* 03/23/2011 0244   BUN 27* 03/23/2011  0244   CREATININE 2.43* 03/23/2011 0244   CALCIUM 8.6 03/23/2011 0244   GFRNONAA 30* 03/23/2011 0244   GFRAA 35* 03/23/2011 0244     ASSESSMENT AND PLAN  There is evidence of recurrence worsening of left ventricular systolic function, not associated with any change in functional status. It is possible that worsening LVEF is asymptomatic since activity is limited by intermittent claudication. It is also possible that there is some degree of intra-observer variability when looking at his left ventricular function. Will retrieve the actual images from the echo performed at [facility]. Since he does not want to undergo any invasive angiographic procedure, even if his EF is lower we will not be able to do revascularization procedures. However, the decreasing EF, if true, would have to lead to a new discussion regarding defibrillator therapy.  We'll restart his hydralazine. Again advised that he quit smoking.  No orders of the defined types were placed in this encounter.   Meds ordered this encounter  Medications  . hydrALAZINE (APRESOLINE) 50 MG tablet    Sig: Take 1  tablet (50 mg total) by mouth every 8 (eight) hours.    Dispense:  90 tablet    Refill:  6    Tamma Brigandi  Thurmon FairMihai Wana Mount, MD, [facility] [phone] office [phone] pager

## 2013-11-15 NOTE — Progress Notes (Signed)
Patient ID: [name], male   DOB: Oct 19, 1965, 48 y.o.   MRN: [id number]   Reviewed echo images from [name] and agree with interpretation. Comparison with previous study shows deterioration in LVEF, now 25-30%. Preexisting inferior wall hypokinesis persists, new severe anteroapical hypokinesis. Restrictive filling pattern.  Suggests progression of CAD in LAD artery distribution. Few options for therapy as he does not want contrast based procedures due to risk of permanent renal failure: refuses dialysis. Will increase nitrate dose.  For similar reasons, not a good ICD candidate.  Thurmon FairMihai Kainen Struckman, MD, [facility] [phone] office [phone] pager

## 2013-11-18 ENCOUNTER — Telehealth: Payer: Self-pay | Admitting: *Deleted

## 2013-11-18 MED ORDER — ISOSORBIDE MONONITRATE ER 60 MG PO TB24: 60.0000 mg | ORAL_TABLET | Freq: Every day | ORAL | Status: DC

## 2013-11-18 NOTE — Telephone Encounter (Signed)
Message copied by Tobin ChadMARTIN, SHARON V. on Mon Nov 18, 2013  8:38 AM ------      Message from: Thurmon FairROITORU, MIHAI      Created: Fri Nov 15, 2013  6:03 PM       Message regarding lower EF on echo refers to him. Please increase isosorbide mono to 60 mg daily      [facility] ------

## 2013-11-18 NOTE — Telephone Encounter (Signed)
Spoke to patient Information given verbalized understanding. E sent  rx to [facility]  complete 30 mg -TAKE 2 TABLETS TO EMPTY BOTTLE.  NEW RX 60 MG DAILY -IMDUR

## 2014-02-10 ENCOUNTER — Ambulatory Visit (INDEPENDENT_AMBULATORY_CARE_PROVIDER_SITE_OTHER): Payer: Medicare Other | Admitting: Cardiovascular Disease

## 2014-02-10 ENCOUNTER — Encounter: Payer: Self-pay | Admitting: Cardiovascular Disease

## 2014-02-10 VITALS — BP 154/86 | HR 63 | Resp 22 | Ht 71.0 in | Wt 200.6 lb

## 2014-02-10 DIAGNOSIS — N185 Chronic kidney disease, stage 5: Secondary | ICD-10-CM

## 2014-02-10 DIAGNOSIS — R0602 Shortness of breath: Secondary | ICD-10-CM

## 2014-02-10 DIAGNOSIS — E785 Hyperlipidemia, unspecified: Secondary | ICD-10-CM

## 2014-02-10 DIAGNOSIS — Z9189 Other specified personal risk factors, not elsewhere classified: Secondary | ICD-10-CM | POA: Insufficient documentation

## 2014-02-10 DIAGNOSIS — Z79899 Other long term (current) drug therapy: Secondary | ICD-10-CM

## 2014-02-10 DIAGNOSIS — I420 Dilated cardiomyopathy: Secondary | ICD-10-CM

## 2014-02-10 DIAGNOSIS — I2589 Other forms of chronic ischemic heart disease: Secondary | ICD-10-CM

## 2014-02-10 DIAGNOSIS — I255 Ischemic cardiomyopathy: Secondary | ICD-10-CM

## 2014-02-10 DIAGNOSIS — I5043 Acute on chronic combined systolic (congestive) and diastolic (congestive) heart failure: Secondary | ICD-10-CM

## 2014-02-10 DIAGNOSIS — I2510 Atherosclerotic heart disease of native coronary artery without angina pectoris: Secondary | ICD-10-CM

## 2014-02-10 MED ORDER — FUROSEMIDE 80 MG PO TABS: 80.0000 mg | ORAL_TABLET | Freq: Every day | ORAL | Status: DC

## 2014-02-10 NOTE — Assessment & Plan Note (Signed)
[name] FellersFrank needs MADIT2 criteria for primary prevention ICD implantation and might also benefit from CRT if his QRS broadens further. Decisions regarding cardiac device therapy also depend heavily on his decision regarding dialysis. It makes little sense to put a defibrillator in if he is not willing to undergo chronic life-saving renal replacement therapy. He also needs to keep in mind that placing a device via conventional transvenous route would potentially compromise future access for hemodialysis. Consider subcutaneous ICD if he starts dialysis.

## 2014-02-10 NOTE — Assessment & Plan Note (Signed)
His LDL cholesterol is not at target (less than 70) but I think we need to focus on his heart failure right now and we'll cervical background to discuss treatment for his hyperlipidemia depending on his decision about chronic dialysis

## 2014-02-10 NOTE — Assessment & Plan Note (Signed)
[name] now has symptoms of shortness of breath at rest and is clearly hypervolemic, but he seems to be tolerating the situation reasonably well I don't think he needs to be immediately hospitalized. Will increase his diuretic from 40 mg to 80 mg daily. Have told him that we will target fluid loss at a rate of roughly 1-2 pounds a day. If he is not losing at least 5 pounds before the end of the week, will add metolazone. Need to try to avoid rapid diuresis since he is so close to end-stage renal disease.

## 2014-02-10 NOTE — Assessment & Plan Note (Signed)
There is strong suspicion that he has had progression of native or graft coronary artery disease as a cause for his worsening cardiomyopathy. We have avoided invasive procedures since he has always refused to consider dialysis. He seems to be wavering in that decision now. If he is agreeable to long-term dialysis, I would strongly recommend that he undergo coronary angiography to evaluate his worsening cardiomyopathy

## 2014-02-10 NOTE — Assessment & Plan Note (Signed)
He is very close to end-stage renal disease. He has declined hemodialysis since he saw a number of his friends died miserably while on this treatment. He seems to have some interest in peritoneal dialysis, but I'm not sure what this is an option for him. He will discuss this with his nephrologist next month.

## 2014-02-10 NOTE — Progress Notes (Signed)
Patient ID: [name], male   DOB: [birth year], 48 y.o.   MRN: [id number]     Reason for office visit Acute exacerbation of congestive heart failure  [name] has developed severe lower extremity edema and now has swelling of his genitals. In parallel he has developed [name] orthopnea and paroxysmal nocturnal dyspnea. This has been going on now for several days. He has to sleep with the bed elevated and has woken up on a couple of occasions short of breath and had to sit bolt upright. He weighs 15 pounds more than he did at his last appointment earlier this summer.  His nephrologist is Dr. Cammy CopaAdegoroye with [facility]. [name]'s last creatinine was 4.8. He now is starting to have problems with hyperphosphatemia (5.1). His potassium was normal (4.9). [name] has always told me that he will never go on hemodialysis. When I asked him this question today he became teary-eyed and told me that he does not want to die until his son comes out of prison. I talked to him about peritoneal dialysis as a potential option. He has never had abdominal surgery. He might be more agreeable to this rather than hemodialysis. I told him that I am not sure this is an option for him since it is outside my specialty. I don't think he's discussed this topic with his nephrologist, but will be seeing him next month.  [name] has extensive atherosclerotic disease involving the coronary circulation and the lower extremities. He initially presented with ischemic cardiomyopathy, EF of 10-15%, and had bypass surgery in 2010 (3 bypasses: LIMA to LAD, SVG to RCA, SVG to OM). He was then lost to followup and was noncompliant with medications. Return to congestive heart failure in late 2012 and a small increase in troponin levels. His EF was 29% with inferolateral scar and apical and anterior ischemia. He also has advanced chronic kidney disease and refused to undergo any contrast based procedures due to the risk of possible  kidney failure. He stated that under no circumstances would he ever want to go on hemodialysis. "He "would rather die". 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 After restarting appropriate medical therapy for CAD and CHF, LV EF increased to 40% (residual inferior and septal hypokinesis) by echo in March 2013. He did well from a functional status, NYHA class II. He was mostly limited by intermittent claudication. He continues to smoke.  Earlier this year, his breathing began to deteriorate and he had an echocardiogram  performed at [facility] on June 9 that showed that his left ventricular ejection fraction was 25-30% with a restrictive filling pattern suggestive of elevated filling pressures. As before he preferred not to have contrast-based procedures to avoid the risk of acute kidney failure.  No Known Allergies  Current Outpatient Prescriptions  Medication Sig Dispense Refill  . ALPRAZolam (XANAX) 1 MG tablet       . calcium carbonate (TUMS EX) 750 MG chewable tablet Chew 1 tablet by mouth as needed for heartburn.      [name]. glipiZIDE (GLUCOTROL XL) 5 MG 24 hr tablet Take 5 mg by mouth daily as needed.       . hydrALAZINE (APRESOLINE) 50 MG tablet Take 1 tablet (50 mg total) by mouth every 8 (eight) hours.  90 tablet  6  . insulin glargine (LANTUS) 100 UNIT/ML injection Inject 10 Units into the skin daily.       .[name]  isosorbide mononitrate (IMDUR) 60 MG 24 hr tablet Take 1 tablet (60 mg total) by mouth daily.  60 tablet  6  . metoprolol (LOPRESSOR) 50 MG tablet Take 1 tablet (50 mg total) by mouth 2 (two) times daily.  60 tablet  9  . nitroGLYCERIN (NITROSTAT) 0.4 MG SL tablet Place 1 tablet (0.4 mg total) under the tongue once.  25 tablet  5  . oxyCODONE (OXY IR/ROXICODONE) 5 MG immediate release tablet       . pantoprazole (PROTONIX) 40 MG tablet Take 40 mg by mouth daily.       . pravastatin (PRAVACHOL) 40 MG tablet Take 40 mg by  mouth at bedtime.      . sodium bicarbonate 650 MG tablet Take 650 mg by mouth 2 (two) times daily.      . furosemide (LASIX) 80 MG tablet Take 1 tablet (80 mg total) by mouth daily.  30 tablet  5   No current facility-administered medications for this visit.    Past Medical History  Diagnosis Date  . Coronary artery disease 07/05/2011    2D ECHO - EF ~40%, moderate concentric LV hypertrophy, LA moderately dilated, mild-moderate septal and inferior wall hypokinesis  . Hypertension   . CHF (congestive heart failure)   . Diabetes mellitus     Insulin dependent  . Cardiomyopathy   . Chronic kidney disease (CKD), stage IV (severe)   . Claudication 05/09/2011    Right and left anterior tibial arteries and Left SFA-occluded; Right CIA-<50% diameter reduction; Right Deep Profunda-70-99% diameter reduction; Right SFA->60% diameter reduction; Right Distal Popliteal/Tibial Artery- >60% diameter reduction; Left CFA and Profunda- >50% diameter reduction  . 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 Cardiac catheterization  05/07/2008    CABG  . Coronary artery bypass graft  06/2008    x3    No family history on file.  History   Social History  . Marital Status: Single    Spouse Name: N/A    Number of Children: N/A  . Years of Education: N/A   Occupational History  . Not on file.   Social History Main Topics  . Smoking status: Former Smoker -- 0.50 packs/day for 25 years    Types: Cigarettes    Quit date: 09/03/2013  . Smokeless tobacco: Not on file  . Alcohol Use: Yes     Comment: occas.  . Drug Use: Yes    Special: Marijuana  . Sexual Activity: Yes   Other Topics Concern  . Not on file   Social History Narrative  . No narrative on file     Review of systems: Orthopnea and paroxysmal nocturnal dyspnea and marked exertional dyspnea. Edema of the lower extremities from the ankles to the genitals. 15 pound weight gain. The patient specifically denies any chest pain at rest or with exertion, syncope, palpitations, focal neurological deficits, cough, hemoptysis or wheezing.  The patient denies abdominal pain, nausea, vomiting, dysphagia, diarrhea, constipation, polyuria, polydipsia, dysuria, hematuria, frequency, urgency, abnormal bleeding or bruising, fever, chills, unexpected weight changes, mood swings, change in skin or hair texture, change in voice quality, auditory or visual problems, allergic reactions or rashes, new musculoskeletal complaints other than usual "aches and pains".   PHYSICAL EXAM BP 154/86  Pulse  63  Resp 22  Ht 5\' 11"  (1.803 m)  Wt 90.992 kg (200 lb 9.6 oz)  BMI 27.99 kg/m2 General: Alert, oriented x3, no distress ; face with a little puffiness especially around the eyes  Head: no evidence of trauma, PERRL, EOMI, no exophtalmos or lid lag, no myxedema, no xanthelasma; normal ears, nose and oropharynx  Neck: normal jugular venous pulsations and no hepatojugular reflux; brisk carotid pulses without delay and no carotid bruits  Chest: clear to auscultation,but with dullness to percussion and reduced breath sounds in the right base, consistent with a pleural effusion; healed sternotomy  Cardiovascular: normal position and quality of the apical impulse, regular rhythm, normal first and second heart sounds, no murmurs, rubs or gallops  Abdomen: no tenderness or distention, no masses by palpation, no abnormal pulsatility, normal bowel sounds, no hepatosplenomegaly  Extremities: Soft deep pitting (3+) edema of the lower extremities all the way to the groin and swelling of the scrotum; 2+ radial, ulnar and brachial pulses bilaterally; 2+ right femoral, thready posterior tibial and dorsalis pedis pulses; 2+ left  femoral, thready posterior tibial and dorsalis pedis pulses; bilateral subclavian and femoral bruits. Skin is warm and color is normal and there is normal capillary refill.  Neurological: grossly nonfocal   EKG: NSR, old anterior MI, and incomplete LBBB 116 ms  Lipid Panel 01/16/2014-[facility] Total cholesterol 175, triglycerides 109, HDL 50, LDL 103   BMET 01/16/2014 BUN 65, creatinine 4.81, alkaline phosphatase 204, albumin 3.8, calcium 9.7, phosphorus 5.1, potassium 4.9, sodium 139, transferrin saturation 18%, parathyroid hormone 143, 09/30/2013, hemoglobin 9.8   ASSESSMENT AND PLAN Acute on chronic combined systolic and diastolic congestive heart failure, NYHA class 4 Romani now has symptoms of shortness of breath at rest and is clearly hypervolemic, but he seems to be tolerating the situation reasonably well I don't think he needs to be immediately hospitalized. Will increase his diuretic from 40 mg to 80 mg daily. Have told him that we will target fluid loss at a rate of roughly 1-2 pounds a day. If he is not losing at least 5 pounds before the end of the week, will add metolazone. Need to try to avoid rapid diuresis since he is so close to end-stage renal disease.  Ischemic dilated cardiomyopathy There is strong suspicion that he has had progression of native or graft coronary artery disease as a cause for his worsening cardiomyopathy. We have avoided invasive procedures since he has always refused to consider dialysis. He seems to be wavering in that decision now. If he is agreeable to long-term dialysis, I would strongly recommend that he undergo coronary angiography to evaluate his worsening cardiomyopathy  Chronic renal insufficiency He is very close to end-stage renal disease. He has declined hemodialysis since he saw a number of his friends died miserably while on this treatment. He seems to have some interest in peritoneal dialysis, but I'm not sure what this is an  option for him. He will discuss this with his nephrologist next month.  Hyperlipidemia His LDL cholesterol is not at target (less than 70) but I think we need to focus on his heart failure right now and we'll cervical background to discuss treatment for his hyperlipidemia depending on his decision about chronic dialysis  At risk for sudden cardiac death Axcel needs MADIT2 criteria for primary prevention ICD implantation and might also benefit from CRT if his QRS broadens further. Decisions regarding cardiac device therapy also depend heavily on his decision regarding dialysis. It  makes little sense to put a defibrillator in if he is not willing to undergo chronic life-saving renal replacement therapy. He also needs to keep in mind that placing a device via conventional transvenous route would potentially compromise future access for hemodialysis. Consider subcutaneous ICD if he starts dialysis.   Orders Placed This Encounter  Procedures  . Basic metabolic panel  . Pro b natriuretic peptide  . 2D Echocardiogram without contrast   Meds ordered this encounter  Medications  . oxyCODONE (OXY IR/ROXICODONE) 5 MG immediate release tablet    Sig:   . ALPRAZolam (XANAX) 1 MG tablet    Sig:   . furosemide (LASIX) 80 MG tablet    Sig: Take 1 tablet (80 mg total) by mouth daily.    Dispense:  30 tablet    Refill:  5    Velma Agnes  Thurmon FairMihai Barnard Sharps, MD, [facility] [phone] office [phone] pager

## 2014-02-10 NOTE — Patient Instructions (Signed)
Increase Furosemide to 80mg  daily.  Your physician has requested that you have an echocardiogram. Echocardiography is a painless test that uses sound waves to create images of your heart. It provides your doctor with information about the size and shape of your heart and how well your heart's chambers and valves are working. This procedure takes approximately one hour. There are no restrictions for this procedure.  Your physician recommends that you return for lab work in:   ONE WEEK at [facility].  Your physician recommends that you weigh, daily, at the same time every day, and in the same amount of clothing. Please record your daily weights on the handout provided and bring it to your next appointment. Call if you have not lost 5 pounds in one week.  Your physician recommends that you schedule a follow-up appointment in: One month with Nada BoozerLaura Ingold, NP.  Dr. Royann Shiversroitoru recommends that you schedule a follow-up appointment in: 3 months.

## 2014-02-17 ENCOUNTER — Inpatient Hospital Stay (HOSPITAL_COMMUNITY): Admission: RE | Admit: 2014-02-17 | Payer: Medicare Other | Source: Ambulatory Visit

## 2014-02-21 ENCOUNTER — Ambulatory Visit (HOSPITAL_COMMUNITY)
Admission: RE | Admit: 2014-02-21 | Discharge: 2014-02-21 | Disposition: A | Discharge status: Home / Self Care | Payer: Medicare Other | Source: Ambulatory Visit | Attending: Cardiovascular Disease | Admitting: Cardiovascular Disease

## 2014-02-21 DIAGNOSIS — Z87891 Personal history of nicotine dependence: Secondary | ICD-10-CM | POA: Diagnosis not present

## 2014-02-21 DIAGNOSIS — I10 Essential (primary) hypertension: Secondary | ICD-10-CM | POA: Insufficient documentation

## 2014-02-21 DIAGNOSIS — I379 Nonrheumatic pulmonary valve disorder, unspecified: Secondary | ICD-10-CM

## 2014-02-21 DIAGNOSIS — E119 Type 2 diabetes mellitus without complications: Secondary | ICD-10-CM | POA: Diagnosis not present

## 2014-02-21 DIAGNOSIS — R0602 Shortness of breath: Secondary | ICD-10-CM

## 2014-02-21 DIAGNOSIS — I2510 Atherosclerotic heart disease of native coronary artery without angina pectoris: Secondary | ICD-10-CM | POA: Diagnosis not present

## 2014-02-21 NOTE — Progress Notes (Signed)
2D Echocardiogram Complete.  02/21/2014   Edgar Corrigan, RDCS  

## 2014-02-26 ENCOUNTER — Encounter: Payer: Self-pay | Admitting: Cardiovascular Disease

## 2014-02-27 ENCOUNTER — Telehealth: Payer: Self-pay | Admitting: Cardiovascular Disease

## 2014-02-27 NOTE — Telephone Encounter (Signed)
Echo results discussed with patient.  Voiced understanding.  Weight is down to 188 lbs

## 2014-02-27 NOTE — Telephone Encounter (Signed)
Returning your call. °

## 2014-03-11 ENCOUNTER — Emergency Department (HOSPITAL_COMMUNITY)
Admission: EM | Admit: 2014-03-11 | Discharge: 2014-03-11 | Disposition: A | Discharge status: Home / Self Care | Payer: Medicare Other | Attending: Emergency Medicine | Admitting: Emergency Medicine

## 2014-03-11 ENCOUNTER — Encounter (HOSPITAL_COMMUNITY): Payer: Self-pay

## 2014-03-11 ENCOUNTER — Ambulatory Visit: Payer: Medicare Other | Admitting: Cardiology

## 2014-03-11 DIAGNOSIS — Z951 Presence of aortocoronary bypass graft: Secondary | ICD-10-CM | POA: Insufficient documentation

## 2014-03-11 DIAGNOSIS — E119 Type 2 diabetes mellitus without complications: Secondary | ICD-10-CM | POA: Insufficient documentation

## 2014-03-11 DIAGNOSIS — Z87891 Personal history of nicotine dependence: Secondary | ICD-10-CM | POA: Insufficient documentation

## 2014-03-11 DIAGNOSIS — R21 Rash and other nonspecific skin eruption: Secondary | ICD-10-CM | POA: Diagnosis present

## 2014-03-11 DIAGNOSIS — I2510 Atherosclerotic heart disease of native coronary artery without angina pectoris: Secondary | ICD-10-CM | POA: Diagnosis not present

## 2014-03-11 DIAGNOSIS — Z9889 Other specified postprocedural states: Secondary | ICD-10-CM | POA: Diagnosis not present

## 2014-03-11 DIAGNOSIS — N184 Chronic kidney disease, stage 4 (severe): Secondary | ICD-10-CM | POA: Insufficient documentation

## 2014-03-11 DIAGNOSIS — I129 Hypertensive chronic kidney disease with stage 1 through stage 4 chronic kidney disease, or unspecified chronic kidney disease: Secondary | ICD-10-CM | POA: Diagnosis not present

## 2014-03-11 DIAGNOSIS — M542 Cervicalgia: Secondary | ICD-10-CM | POA: Insufficient documentation

## 2014-03-11 DIAGNOSIS — I509 Heart failure, unspecified: Secondary | ICD-10-CM | POA: Diagnosis not present

## 2014-03-11 DIAGNOSIS — Z79899 Other long term (current) drug therapy: Secondary | ICD-10-CM | POA: Insufficient documentation

## 2014-03-11 DIAGNOSIS — B029 Zoster without complications: Secondary | ICD-10-CM | POA: Insufficient documentation

## 2014-03-11 LAB — COMPREHENSIVE METABOLIC PANEL
ALT: 10 U/L (ref 0–53)
AST: 14 U/L (ref 0–37)
Albumin: 3.3 g/dL — ABNORMAL LOW (ref 3.5–5.2)
Alkaline Phosphatase: 164 U/L — ABNORMAL HIGH (ref 39–117)
Anion gap: 18 — ABNORMAL HIGH (ref 5–15)
BUN: 79 mg/dL — ABNORMAL HIGH (ref 6–23)
CALCIUM: 9.9 mg/dL (ref 8.4–10.5)
CO2: 20 mEq/L (ref 19–32)
CREATININE: 5.8 mg/dL — AB (ref 0.50–1.35)
Chloride: 99 mEq/L (ref 96–112)
GFR calc non Af Amer: 10 mL/min — ABNORMAL LOW (ref >90)
GFR, EST AFRICAN AMERICAN: 12 mL/min — AB (ref >90)
GLUCOSE: 83 mg/dL (ref 70–99)
Potassium: 4.5 mEq/L (ref 3.7–5.3)
Sodium: 137 mEq/L (ref 137–147)
Total Bilirubin: 1.9 mg/dL — ABNORMAL HIGH (ref 0.3–1.2)
Total Protein: 6.9 g/dL (ref 6.0–8.3)

## 2014-03-11 LAB — CBC WITH DIFFERENTIAL/PLATELET
Basophils Absolute: 0 10*3/uL (ref 0.0–0.1)
Basophils Relative: 0 % (ref 0–1)
EOS ABS: 0.2 10*3/uL (ref 0.0–0.7)
EOS PCT: 2 % (ref 0–5)
HCT: 37.2 % — ABNORMAL LOW (ref 39.0–52.0)
Hemoglobin: 12.3 g/dL — ABNORMAL LOW (ref 13.0–17.0)
LYMPHS ABS: 0.7 10*3/uL (ref 0.7–4.0)
Lymphocytes Relative: 10 % — ABNORMAL LOW (ref 12–46)
MCH: 29.4 pg (ref 26.0–34.0)
MCHC: 33.1 g/dL (ref 30.0–36.0)
MCV: 88.8 fL (ref 78.0–100.0)
Monocytes Absolute: 0.9 10*3/uL (ref 0.1–1.0)
Monocytes Relative: 12 % (ref 3–12)
Neutro Abs: 5.7 10*3/uL (ref 1.7–7.7)
Neutrophils Relative %: 76 % (ref 43–77)
Platelets: 122 10*3/uL — ABNORMAL LOW (ref 150–400)
RBC: 4.19 MIL/uL — AB (ref 4.22–5.81)
RDW: 16.2 % — AB (ref 11.5–15.5)
WBC: 7.6 10*3/uL (ref 4.0–10.5)

## 2014-03-11 MED ORDER — ACYCLOVIR 800 MG PO TABS: 800.0000 mg | ORAL_TABLET | Freq: Every day | ORAL | Status: DC

## 2014-03-11 MED ORDER — OXYCODONE HCL 5 MG PO TABS: 5.0000 mg | ORAL_TABLET | ORAL | Status: DC | PRN

## 2014-03-11 NOTE — ED Notes (Addendum)
Pt sent from [facility] which is pt's PCP.  Pt c/o neck pain radiating down L arm.  Pt also reports rash on LUE.

## 2014-03-11 NOTE — ED Provider Notes (Signed)
CSN: [id number]     Arrival date & time 03/11/14  1151 History   First MD Initiated Contact with Patient 03/11/14 1454     Chief Complaint  Patient presents with  . Neck Pain  . Rash     (Consider location/radiation/quality/duration/timing/severity/associated sxs/prior Treatment) HPI Comments:  Presents to the ER for evaluation of rash on his left arm and neck pain. Patient reports that the rash came out 3 days ago. Patient reports a burning, painful rash that seems to be spreading up his arm from his hand. Patient also complaining of pain on the left side of his neck. He reports that that began after chopping wood. He was seen by his primary care doctor for this earlier and sent to the ER for further evaluation. Patient reports constant mild pain on the left side of the neck that worsens when he turns his head to the right. No numbness, tingling or weakness in the upper extremities.  Patient is a 48 y.o. male presenting with neck pain and rash.  Neck Pain Rash   Past Medical History  Diagnosis Date  . Coronary artery disease 07/05/2011    2D ECHO - EF ~40%, moderate concentric LV hypertrophy, LA moderately dilated, mild-moderate septal and inferior wall hypokinesis  . Hypertension   . CHF (congestive heart failure)   . Diabetes mellitus     Insulin dependent  . Cardiomyopathy   . Chronic kidney disease (CKD), stage IV (severe)   . Claudication 05/09/2011    Right and left anterior tibial arteries and Left SFA-occluded; Right CIA-<50% diameter reduction; Right Deep Profunda-70-99% diameter reduction; Right SFA->60% diameter reduction; Right Distal Popliteal/Tibial Artery- >60% diameter reduction; Left CFA and Profunda- >50% diameter reduction  . 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 Cardiac catheterization  05/07/2008    CABG  . Coronary artery bypass graft  06/2008    x3   No family history on file. History  Substance Use Topics  . Smoking status: Former Smoker -- 0.50 packs/day for 25 years    Types: Cigarettes    Quit date: 09/03/2013  . Smokeless tobacco: Not on file  . Alcohol Use: Yes     Comment: occas.    Review of Systems  Musculoskeletal: Positive for neck pain.  Skin: Positive for rash.  All other systems reviewed and are negative.     Allergies  Review of patient's allergies indicates no known allergies.  Home Medications   Prior to Admission medications   Medication Sig Start Date End Date Taking? Authorizing Provider  ALPRAZolam Prudy Feeler(XANAX) 1 MG tablet Take 1 mg by mouth 2 (two) times daily.  01/20/14  Yes Historical Provider, MD  calcium carbonate (TUMS EX) 750 MG chewable tablet Chew 1 tablet by mouth as needed for heartburn.   Yes Historical Provider, MD  Ferrous Sulfate (IRON) 325 (65 FE) MG TABS Take 325 mg by mouth daily.   Yes Historical Provider, MD  furosemide (LASIX) 80 MG tablet Take 1 tablet (80 mg total) by mouth daily. 02/10/14  Yes Mihai Croitoru, MD  glipiZIDE (GLUCOTROL XL) 5 MG 24 hr tablet Take 5 mg by mouth daily with breakfast.    Yes Historical Provider, MD  hydrALAZINE (APRESOLINE) 50 MG tablet Take 1 tablet (50 mg total)  by mouth every 8 (eight) hours. 11/07/13  Yes Mihai Croitoru, MD  isosorbide mononitrate (IMDUR) 60 MG 24 hr tablet Take 1 tablet (60 mg total) by mouth daily. 11/18/13  Yes Mihai Croitoru, MD  metoprolol (LOPRESSOR) 50 MG tablet Take 1 tablet (50 mg total) by mouth 2 (two) times daily. 03/13/13  Yes Mihai Croitoru, MD  oxyCODONE (OXY IR/ROXICODONE) 5 MG immediate release tablet Take 10 mg by mouth every 3 (three) hours as needed (pain).  01/06/14  Yes Historical Provider, MD  pantoprazole (PROTONIX) 40 MG tablet Take 40 mg by mouth daily.  05/08/13  Yes Historical  Provider, MD  sodium bicarbonate 650 MG tablet Take 650 mg by mouth 2 (two) times daily.   Yes Historical Provider, MD  nitroGLYCERIN (NITROSTAT) 0.4 MG SL tablet Place 1 tablet (0.4 mg total) under the tongue once. 10/01/13   Mihai Croitoru, MD   BP 142/76 mmHg  Pulse 70  Temp(Src) 98.9 F (37.2 C) (Oral)  Resp 18  SpO2 100% Physical Exam  Constitutional: He is oriented to person, place, and time. He appears well-developed and well-nourished. No distress.  HENT:  Head: Normocephalic and atraumatic.  Right Ear: Hearing normal.  Left Ear: Hearing normal.  Nose: Nose normal.  Mouth/Throat: Oropharynx is clear and moist and mucous membranes are normal.  Eyes: Conjunctivae and EOM are normal. Pupils are equal, round, and reactive to light.  Neck: Normal range of motion. Neck supple. Muscular tenderness present. No Brudzinski's sign and no Kernig's sign noted.    Cardiovascular: Regular rhythm, S1 normal and S2 normal.  Exam reveals no gallop and no friction rub.   No murmur heard. Pulmonary/Chest: Effort normal and breath sounds normal. No respiratory distress. He exhibits no tenderness.  Abdominal: Soft. Normal appearance and bowel sounds are normal. There is no hepatosplenomegaly. There is no tenderness. There is no rebound, no guarding, no tenderness at McBurney's point and negative Murphy's sign. No hernia.  Musculoskeletal: Normal range of motion.  Neurological: He is alert and oriented to person, place, and time. He has normal strength. No cranial nerve deficit or sensory deficit. Coordination normal. GCS eye subscore is 4. GCS verbal subscore is 5. GCS motor subscore is 6.  Skin: Skin is warm, dry and intact. Rash noted. Rash is vesicular. No cyanosis.     Psychiatric: He has a normal mood and affect. His speech is normal and behavior is normal. Thought content normal.  Nursing note and vitals reviewed.   ED Course  Procedures (including critical care time) Labs Review Labs  Reviewed  CBC WITH DIFFERENTIAL - Abnormal; Notable for the following:    RBC 4.19 (*)    Hemoglobin 12.3 (*)    HCT 37.2 (*)    RDW 16.2 (*)    Platelets 122 (*)    Lymphocytes Relative 10 (*)    All other components within normal limits  COMPREHENSIVE METABOLIC PANEL - Abnormal; Notable for the following:    BUN 79 (*)    Creatinine, Ser 5.80 (*)    Albumin 3.3 (*)    Alkaline Phosphatase 164 (*)    Total Bilirubin 1.9 (*)    GFR calc non Af Amer 10 (*)    GFR calc Af Amer 12 (*)    Anion gap 18 (*)    All other components within normal limits    Imaging Review No results found.   EKG Interpretation None      MDM   Final diagnoses:  None  herpes zoster  Chronic renal failure, worsening  Patient presents to the ER for evaluation of multiple problems. He was referred to the ER by his primary care physician. It's not clear why they referred him to the emergency department. He tells me that they thought he was dehydrated based on their exam.  Patient does have a vesicular rash on the left arm in a dermatomal distribution consistent with herpes zoster. He does not appear to have a disseminated infection. He is complaining of some left-sided neck pain that worsens with movement of his head after chopping wood. This is clearly musculoskeletal in nature. No sign of meningitis. I do not feel that the patient needs workup for herpes meningitis at this time. Symptoms have been present for more than a week without any change.  Patient's lab work does reveal worsening renal function. Patient had lab work performed on October 28 which showed a creatinine of 4.6. Creatinine today is 5.8. I had a lengthy discussion with the patient. He does not want to consider dialysis at this time. Patient has normal electrolytes. Does not require any emergent dialysis for consideration at this time. Based on the patient's cardiomyopathy and congestive heart failure, I do not want to give him any IV fluids  at this time. He will increase his oral intake and call his nephrologist in [facility] in the morning to be seen. He was given a copy of his lab reports. Return for worsening symptoms.  Patient will be treated with acyclovir and oxycodone.    Gilda Crease, MD 03/11/14 1725

## 2014-03-11 NOTE — Discharge Instructions (Signed)
Call your kidney doctor in the morning to be seen in the office as soon as possible.  Chronic Kidney Disease Chronic kidney disease occurs when the kidneys are damaged over a long period. The kidneys are two organs that lie on either side of the spine between the middle of the back and the front of the abdomen. The kidneys:   Remove wastes and extra water from the blood.   Produce important hormones. These help keep bones strong, regulate blood pressure, and help create red blood cells.   Balance the fluids and chemicals in the blood and tissues. A small amount of kidney damage may not cause problems, but a large amount of damage may make it difficult or impossible for the kidneys to work the way they should. If steps are not taken to slow down the kidney damage or stop it from getting worse, the kidneys may stop working permanently. Most of the time, chronic kidney disease does not go away. However, it can often be controlled, and those with the disease can usually live normal lives. CAUSES  The most common causes of chronic kidney disease are diabetes and high blood pressure (hypertension). Chronic kidney disease may also be caused by:   Diseases that cause the kidneys' filters to become inflamed.   Diseases that affect the immune system.   Genetic diseases.   Medicines that damage the kidneys, such as anti-inflammatory medicines.  Poisoning or exposure to toxic substances.   A reoccurring kidney or urinary infection.   A problem with urine flow. This may be caused by:   Cancer.   Kidney stones.   An enlarged prostate in males. SIGNS AND SYMPTOMS  Because the kidney damage in chronic kidney disease occurs slowly, symptoms develop slowly and may not be obvious until the kidney damage becomes severe. A person may have a kidney disease for years without showing any symptoms. Symptoms can include:   Swelling (edema) of the legs, ankles, or feet.   Tiredness  (lethargy).   Nausea or vomiting.   Confusion.   Problems with urination, such as:   Decreased urine production.   Frequent urination, especially at night.   Frequent accidents in children who are potty trained.   Muscle twitches and cramps.   Shortness of breath.  Weakness.   Persistent itchiness.   Loss of appetite.  Metallic taste in the mouth.  Trouble sleeping.  Slowed development in children.  Short stature in children. DIAGNOSIS  Chronic kidney disease may be detected and diagnosed by tests, including blood, urine, imaging, or kidney biopsy tests.  TREATMENT  Most chronic kidney diseases cannot be cured. Treatment usually involves relieving symptoms and preventing or slowing the progression of the disease. Treatment may include:   A special diet. You may need to avoid alcohol and foods thatare salty and high in potassium.   Medicines. These may:   Lower blood pressure.   Relieve anemia.   Relieve swelling.   Protect the bones. HOME CARE INSTRUCTIONS   Follow your prescribed diet.   Take medicines only as directed by your health care provider. Do not take any new medicines (prescription, over-the-counter, or nutritional supplements) unless approved by your health care provider. Many medicines can worsen your kidney damage or need to have the dose adjusted.   Quit smoking if you smoke. Talk to your health care provider about a smoking cessation program.   Keep all follow-up visits as directed by your health care provider. SEEK IMMEDIATE MEDICAL CARE IF:  Your  symptoms get worse or you develop new symptoms.   You develop symptoms of end-stage kidney disease. These include:   Headaches.   Abnormally dark or light skin.   Numbness in the hands or feet.   Easy bruising.   Frequent hiccups.   Menstruation stops.   You have a fever.   You have decreased urine production.   You havepain or bleeding when  urinating. MAKE SURE YOU:  Understand these instructions.  Will watch your condition.  Will get help right away if you are not doing well or get worse. FOR MORE INFORMATION   American Association of Kidney Patients: ResidentialShow.iswww.aakp.org  National Kidney Foundation: www.kidney.org  American Kidney Fund: FightingMatch.com.eewww.akfinc.org  Life Options [facility]: www.lifeoptions.org and www.kidneyschool.org Document Released: 01/19/2008 Document Revised: 08/26/2013 Document Reviewed: 12/09/2011 [facility] Patient Information 2015 [place]Care, [place]. This information is not intended to replace advice given to you by your health care provider. Make sure you discuss any questions you have with your health care provider.  Shingles Shingles (herpes zoster) is an infection that is caused by the same virus that causes chickenpox (varicella). The infection causes a painful skin rash and fluid-filled blisters, which eventually break open, crust over, and heal. It may occur in any area of the body, but it usually affects only one side of the body or face. The pain of shingles usually lasts about 1 month. However, some people with shingles may develop long-term (chronic) pain in the affected area of the body. Shingles often occurs many years after the person had chickenpox. It is more common:  In people older than 50 years.  In people with weakened immune systems, such as those with HIV, AIDS, or cancer.  In people taking medicines that weaken the immune system, such as transplant medicines.  In people under great stress. CAUSES  Shingles is caused by the varicella zoster virus (VZV), which also causes chickenpox. After a person is infected with the virus, it can remain in the person's body for years in an inactive state (dormant). To cause shingles, the virus reactivates and breaks out as an infection in a nerve root. The virus can be spread from person to person (contagious) through contact with open blisters of  the shingles rash. It will only spread to people who have not had chickenpox. When these people are exposed to the virus, they may develop chickenpox. They will not develop shingles. Once the blisters scab over, the person is no longer contagious and cannot spread the virus to others. SIGNS AND SYMPTOMS  Shingles shows up in stages. The initial symptoms may be pain, itching, and tingling in an area of the skin. This pain is usually described as burning, stabbing, or throbbing.In a few days or weeks, a painful red rash will appear in the area where the pain, itching, and tingling were felt. The rash is usually on one side of the body in a band or belt-like pattern. Then, the rash usually turns into fluid-filled blisters. They will scab over and dry up in approximately 2-3 weeks. Flu-like symptoms may also occur with the initial symptoms, the rash, or the blisters. These may include:  Fever.  Chills.  Headache.  Upset stomach. DIAGNOSIS  Your health care provider will perform a skin exam to diagnose shingles. Skin scrapings or fluid samples may also be taken from the blisters. This sample will be examined under a microscope or sent to a lab for further testing. TREATMENT  There is no specific cure for shingles. Your health  care provider will likely prescribe medicines to help you manage the pain, recover faster, and avoid long-term problems. This may include antiviral drugs, anti-inflammatory drugs, and pain medicines. HOME CARE INSTRUCTIONS   Take a cool bath or apply cool compresses to the area of the rash or blisters as directed. This may help with the pain and itching.   Take medicines only as directed by your health care provider.   Rest as directed by your health care provider.  Keep your rash and blisters clean with mild soap and cool water or as directed by your health care provider.  Do not pick your blisters or scratch your rash. Apply an anti-itch cream or numbing creams to the  affected area as directed by your health care provider.  Keep your shingles rash covered with a loose bandage (dressing).  Avoid skin contact with:  Babies.   Pregnant women.   Children with eczema.   Elderly people with transplants.   People with chronic illnesses, such as leukemia or AIDS.   Wear loose-fitting clothing to help ease the pain of material rubbing against the rash.  Keep all follow-up visits as directed by your health care provider.If the area involved is on your face, you may receive a referral for a specialist, such as an eye doctor (ophthalmologist) or an ear, nose, and throat (ENT) doctor. Keeping all follow-up visits will help you avoid eye problems, chronic pain, or disability.  SEEK IMMEDIATE MEDICAL CARE IF:   You have facial pain, pain around the eye area, or loss of feeling on one side of your face.  You have ear pain or ringing in your ear.  You have loss of taste.  Your pain is not relieved with prescribed medicines.   Your redness or swelling spreads.   You have more pain and swelling.  Your condition is worsening or has changed.   You have a fever. MAKE SURE YOU:  Understand these instructions.  Will watch your condition.  Will get help right away if you are not doing well or get worse. Document Released: 04/11/2005 Document Revised: 08/26/2013 Document Reviewed: 11/24/2011 [facility] Patient Information 2015 [place]ExitCare, [place]LLC. This information is not intended to replace advice given to you by your health care provider. Make sure you discuss any questions you have with your health care provider.

## 2014-03-17 ENCOUNTER — Telehealth: Payer: Self-pay | Admitting: Cardiovascular Disease

## 2014-03-17 MED ORDER — ISOSORBIDE MONONITRATE ER 60 MG PO TB24: 60.0000 mg | ORAL_TABLET | Freq: Every day | ORAL | Status: DC

## 2014-03-17 NOTE — Telephone Encounter (Signed)
Pt need a new prescription for his Isosorbmono 60 mg #30. Please call to [place] [phone]

## 2014-03-17 NOTE — Telephone Encounter (Signed)
Spoke with pt, aware refill sent into the pharm 

## 2014-05-05 ENCOUNTER — Ambulatory Visit: Payer: Medicare Other | Admitting: Cardiovascular Disease

## 2014-08-07 ENCOUNTER — Encounter: Payer: Self-pay | Admitting: Cardiovascular Disease

## 2014-08-07 ENCOUNTER — Ambulatory Visit (INDEPENDENT_AMBULATORY_CARE_PROVIDER_SITE_OTHER): Payer: Medicare Other | Admitting: Cardiovascular Disease

## 2014-08-07 VITALS — BP 104/64 | HR 62 | Resp 24 | Ht 71.0 in | Wt 194.7 lb

## 2014-08-07 DIAGNOSIS — I10 Essential (primary) hypertension: Secondary | ICD-10-CM | POA: Diagnosis not present

## 2014-08-07 DIAGNOSIS — I5043 Acute on chronic combined systolic (congestive) and diastolic (congestive) heart failure: Secondary | ICD-10-CM | POA: Diagnosis not present

## 2014-08-07 DIAGNOSIS — I5042 Chronic combined systolic (congestive) and diastolic (congestive) heart failure: Secondary | ICD-10-CM

## 2014-08-07 DIAGNOSIS — I25119 Atherosclerotic heart disease of native coronary artery with unspecified angina pectoris: Secondary | ICD-10-CM

## 2014-08-07 DIAGNOSIS — I255 Ischemic cardiomyopathy: Secondary | ICD-10-CM

## 2014-08-07 DIAGNOSIS — Z951 Presence of aortocoronary bypass graft: Secondary | ICD-10-CM

## 2014-08-07 DIAGNOSIS — I420 Dilated cardiomyopathy: Secondary | ICD-10-CM

## 2014-08-07 DIAGNOSIS — E785 Hyperlipidemia, unspecified: Secondary | ICD-10-CM

## 2014-08-07 DIAGNOSIS — N185 Chronic kidney disease, stage 5: Secondary | ICD-10-CM

## 2014-08-07 NOTE — Patient Instructions (Signed)
Double the Furosemide until you lose 4 lbs.  Dr. Royann Shiversroitoru recommends that you schedule a follow-up appointment in: Next available.

## 2014-08-07 NOTE — Progress Notes (Signed)
Patient ID: [name], male   DOB: [birth year], 49 y.o.   MRN: [id number]     Cardiology Office Note   Date:  08/10/2014   ID:  [name], DOB 08/15/1965, MRN [id number]  PCP:  [name], NP  Cardiologist:   Thurmon Fair, MD   Chief Complaint  Patient presents with  . Follow-up    pt states has not started dialysis needed for fluid build up on kidneys  . Shortness of Breath  . Edema    legs/ankles/feet/stomach constant, weeping       History of Present Illness: [name] is a 49 y.o. male who presents for worsening dyspnea and edema in the setting of severe ischemic cardiomyopathy, CAD s/p CABG and advanced chronic kidney disease. He is now considering peritoneal dialysis, but still refuses hemodialysis. He is due to see a surgeon for PD catheter implantation (Dr. Sondra Come in [facility], his nephrologist is Dr. Cammy Copa with [facility]). As always, he has lots of social issues. He lives with his mother. His son is due to come out of prison and he wants to live with him, but his mother refuses to allow the grandson to live there.  His edema is not quite as bad as it was last October, but he is 5-10 lb above what we had previously estimated as the optimal balance between his CHF and CKD. He denies angina.  [name] has extensive atherosclerotic disease involving the coronary circulation and the lower extremities. He initially presented with ischemic cardiomyopathy, EF of 10-15%, and had bypass surgery in 2010 (3 bypasses: LIMA to LAD, SVG to RCA, SVG to OM). He was then lost to followup and was noncompliant with medications. Return to congestive heart failure in late 2012 and a small increase in troponin levels. His EF was 29% with inferolateral scar and apical and anterior ischemia. He also has advanced chronic kidney disease and refused to undergo any contrast based procedures due to the risk of possible kidney failure. He stated that under no circumstances would  he ever want to go on hemodialysis. "He "would rather die". 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 After restarting appropriate medical therapy for CAD and CHF, LV EF increased to 40% (residual inferior and septal hypokinesis) by echo in March 2013.  He continues to smoke.  In 2015, his breathing began to deteriorate and he had an echocardiogram performed at [facility] on June 9 that showed that his left ventricular ejection fraction was 25-30% with a restrictive filling pattern suggestive of elevated filling pressures. As before he preferred not to have contrast-based procedures to avoid the risk of acute kidney failure. Repeat echo in October confirmed LVEF around 25%, restrictive filling pattern.  Past Medical History  Diagnosis Date  . Coronary artery disease 07/05/2011    2D ECHO - EF ~40%, moderate concentric LV hypertrophy, LA moderately dilated, mild-moderate septal and inferior wall hypokinesis  . Hypertension   . CHF (congestive heart failure)   . Diabetes mellitus     Insulin dependent  . Cardiomyopathy   . Chronic kidney disease (CKD), stage IV (severe)   . Claudication 05/09/2011    Right and left anterior tibial arteries and Left SFA-occluded; Right CIA-<50% diameter reduction; Right Deep Profunda-70-99% diameter reduction; Right SFA->60% diameter reduction; Right Distal Popliteal/Tibial Artery- >60% diameter reduction; Left CFA and Profunda- >50% diameter reduction  . S/P CABG (coronary artery bypass graft) 03/23/2011`  STRESS TEST - LV EF 29%, mild reversible within the apical segment of the anterior and anterolateral wall, small infarct in the inferolateral wall, global hypokinesia    Past Surgical History  Procedure Laterality Date  . Cardiac catheterization  05/07/2008    CABG  . Coronary artery bypass graft  06/2008    x3     Current Outpatient Prescriptions  Medication Sig Dispense Refill  .  acyclovir (ZOVIRAX) 800 MG tablet Take 1 tablet (800 mg total) by mouth 5 (five) times daily. 56 tablet 0  . calcium carbonate (TUMS EX) 750 MG chewable tablet Chew 1 tablet by mouth as needed for heartburn.    . Ferrous Sulfate (IRON) 325 (65 FE) MG TABS Take 325 mg by mouth daily.    . furosemide (LASIX) 80 MG tablet Take 1 tablet (80 mg total) by mouth daily. 30 tablet 5  . hydrALAZINE (APRESOLINE) 50 MG tablet Take 1 tablet (50 mg total) by mouth every 8 (eight) hours. 90 tablet 6  . isosorbide mononitrate (IMDUR) 60 MG 24 hr tablet Take 1 tablet (60 mg total) by mouth daily. 60 tablet 6  . LEVEMIR 100 UNIT/ML injection Inject 5 Units into the skin daily before breakfast.    . metoprolol (LOPRESSOR) 50 MG tablet Take 1 tablet (50 mg total) by mouth 2 (two) times daily. 60 tablet 9  . nitroGLYCERIN (NITROSTAT) 0.4 MG SL tablet Place 1 tablet (0.4 mg total) under the tongue once. 25 tablet 5  . Oxycodone HCl 10 MG TABS Take 1 tablet by mouth every 6 (six) hours as needed.    . pantoprazole (PROTONIX) 40 MG tablet Take 40 mg by mouth daily.     . sodium bicarbonate 650 MG tablet Take 650 mg by mouth 2 (two) times daily.     No current facility-administered medications for this visit.    Allergies:   Review of patient's allergies indicates no known allergies.    Social History:  The patient  reports that he quit smoking about 11 months ago. His smoking use included Cigarettes. He has a 12.5 pack-year smoking history. He does not have any smokeless tobacco history on file. He reports that he drinks alcohol. He reports that he uses illicit drugs (Marijuana).     ROS:  Please see the history of present illness.    Orthopnea and marked exertional dyspnea. Edema of the lower extremities from the ankles to the genitals. 5-10 pound weight gain. The patient specifically denies any chest pain at rest or with exertion, syncope, palpitations, focal neurological deficits, cough, hemoptysis or  wheezing.  The patient denies abdominal pain, nausea, vomiting, dysphagia, diarrhea, constipation, polyuria, polydipsia, dysuria, hematuria, frequency, urgency, abnormal bleeding or bruising, fever, chills, unexpected weight changes, mood swings, change in skin or hair texture, change in voice quality, auditory or visual problems, allergic reactions or rashes, new musculoskeletal complaints other than usual "aches and pains". All other systems are reviewed and negative.    PHYSICAL EXAM: VS:  BP 104/64 mmHg  Pulse 62  Resp 24  Ht 5\' 11"  (1.803 m)  Wt 194 lb 11.2 oz (88.315 kg)  BMI 27.17 kg/m2 , BMI Body mass index is 27.17 kg/(m^2).  General: Alert, oriented x3, no distress ; face with a little puffiness especially around the eyes  Head: no evidence of trauma, PERRL, EOMI, no exophtalmos or lid lag, no myxedema, no xanthelasma; normal ears, nose and oropharynx  Neck: normal jugular venous pulsations and no hepatojugular reflux; brisk carotid pulses  without delay and no carotid bruits  Chest: clear to auscultation,but with dullness to percussion and reduced breath sounds in the right base, consistent with a pleural effusion; healed sternotomy  Cardiovascular: normal position and quality of the apical impulse, regular rhythm, normal first and second heart sounds, no murmurs, rubs or gallops  Abdomen: no tenderness or distention, no masses by palpation, no abnormal pulsatility, normal bowel sounds, no hepatosplenomegaly  Extremities: Soft deep pitting (3+) edema of the lower extremities all the way to the groin and swelling of the scrotum; 2+ radial, ulnar and brachial pulses bilaterally; 2+ right femoral, thready posterior tibial and dorsalis pedis pulses; 2+ left femoral, thready posterior tibial and dorsalis pedis pulses; bilateral subclavian and femoral bruits. Skin is warm and color is normal and there is normal capillary refill.  Neurological: grossly nonfocal  Psych: euthymic mood,  full affect   EKG:  EKG is ordered today. The ekg ordered today demonstrates NSR, atypical LBBB 122 ms, left axis deviation   Recent Labs: 03/11/2014: ALT 10; BUN 79*; Creatinine 5.80*; Hemoglobin 12.3*; Platelets 122*; Potassium 4.5; Sodium 137     Wt Readings from Last 3 Encounters:  08/07/14 194 lb 11.2 oz (88.315 kg)  02/10/14 200 lb 9.6 oz (90.992 kg)  11/07/13 185 lb 9.6 oz (84.188 kg)     ASSESSMENT AND PLAN:  Acute on chronic combined systolic and diastolic congestive heart failure, NYHA class 4 [name] now has symptoms of shortness of breath at rest and is clearly hypervolemic, but he seems to be tolerating the situation reasonably well. Will increase his diuretic from 40 mg to 80 mg daily. Need to try to avoid rapid diuresis since he is so close to end-stage renal disease and dialysis is not yet set up.  Ischemic dilated cardiomyopathy There is strong suspicion that he has had progression of native or graft coronary artery disease as a cause for his worsening cardiomyopathy. We have avoided invasive procedures since he has always refused to consider dialysis. If he starts long-term dialysis, I would strongly recommend that he undergo coronary angiography to evaluate his worsening cardiomyopathy  Chronic renal insufficiency He is very close to end-stage renal disease. He has declined hemodialysis since he saw a number of his friends died miserably while on this treatment. He seems to be a candidate for and shows interest in peritoneal dialysis.  Hyperlipidemia His LDL cholesterol is not at target (less than 70) but I think we need to focus on his heart failure right now and we'll circle back around to discuss treatment for his hyperlipidemia depending on his decision about chronic dialysis  At risk for sudden cardiac death Lucianne Muss MADIT2 criteria for primary prevention ICD implantation and might also benefit from CRT if his QRS broadens further (currently borderline  indication). Decisions regarding cardiac device therapy also depend heavily on his decision regarding dialysis. It makes little sense to put a defibrillator in if he is not willing to undergo chronic life-saving renal replacement therapy. He also needs to keep in mind that placing a device via conventional transvenous route would potentially compromise future access for hemodialysis. Consider subcutaneous ICD if he starts dialysis, but this would not offer CRT benefit.    Current medicines are reviewed at length with the patient today.  The patient does not have concerns regarding medicines.  The following changes have been made:  Double furosemide dose until weight decrease by 4 lb.  Labs/ tests ordered today include:  No orders of the defined types  were placed in this encounter.    Patient Instructions  Double the Furosemide until you lose 4 lbs.  Dr. Royann Shivers recommends that you schedule a follow-up appointment in: Next available.      [name] Bimler, MD  08/10/2014 12:59 PM    Thurmon Fair, MD, [facility] [phone] office [phone] pager

## 2014-09-05 ENCOUNTER — Telehealth: Payer: Self-pay | Admitting: Cardiovascular Disease

## 2014-09-05 NOTE — Telephone Encounter (Signed)
[facility] for appointment on 09/26/14 with Dr Royann Shiversroitoru.  [facility] Dr Croitoru's schedule on 09/26/14. lp

## 2014-09-24 ENCOUNTER — Inpatient Hospital Stay (HOSPITAL_COMMUNITY)
Admission: EM | Admit: 2014-09-24 | Discharge: 2014-10-18 | DRG: 853 | Disposition: A | Discharge status: Skilled Nursing Facility | Payer: Medicare Other | Attending: Internal Medicine | Admitting: Internal Medicine

## 2014-09-24 ENCOUNTER — Encounter (HOSPITAL_COMMUNITY): Payer: Self-pay | Admitting: Emergency Medicine

## 2014-09-24 ENCOUNTER — Emergency Department (HOSPITAL_COMMUNITY): Payer: Medicare Other

## 2014-09-24 DIAGNOSIS — M79604 Pain in right leg: Secondary | ICD-10-CM | POA: Diagnosis present

## 2014-09-24 DIAGNOSIS — E118 Type 2 diabetes mellitus with unspecified complications: Secondary | ICD-10-CM

## 2014-09-24 DIAGNOSIS — I509 Heart failure, unspecified: Secondary | ICD-10-CM | POA: Diagnosis not present

## 2014-09-24 DIAGNOSIS — D6959 Other secondary thrombocytopenia: Secondary | ICD-10-CM | POA: Diagnosis not present

## 2014-09-24 DIAGNOSIS — E119 Type 2 diabetes mellitus without complications: Secondary | ICD-10-CM | POA: Diagnosis present

## 2014-09-24 DIAGNOSIS — K5900 Constipation, unspecified: Secondary | ICD-10-CM | POA: Diagnosis present

## 2014-09-24 DIAGNOSIS — IMO0001 Reserved for inherently not codable concepts without codable children: Secondary | ICD-10-CM | POA: Diagnosis present

## 2014-09-24 DIAGNOSIS — N186 End stage renal disease: Secondary | ICD-10-CM | POA: Diagnosis present

## 2014-09-24 DIAGNOSIS — I10 Essential (primary) hypertension: Secondary | ICD-10-CM | POA: Diagnosis not present

## 2014-09-24 DIAGNOSIS — I129 Hypertensive chronic kidney disease with stage 1 through stage 4 chronic kidney disease, or unspecified chronic kidney disease: Secondary | ICD-10-CM | POA: Diagnosis present

## 2014-09-24 DIAGNOSIS — E872 Acidosis: Secondary | ICD-10-CM | POA: Diagnosis not present

## 2014-09-24 DIAGNOSIS — Z951 Presence of aortocoronary bypass graft: Secondary | ICD-10-CM

## 2014-09-24 DIAGNOSIS — I255 Ischemic cardiomyopathy: Secondary | ICD-10-CM | POA: Diagnosis present

## 2014-09-24 DIAGNOSIS — I25119 Atherosclerotic heart disease of native coronary artery with unspecified angina pectoris: Secondary | ICD-10-CM | POA: Diagnosis not present

## 2014-09-24 DIAGNOSIS — IMO0002 Reserved for concepts with insufficient information to code with codable children: Secondary | ICD-10-CM | POA: Diagnosis present

## 2014-09-24 DIAGNOSIS — T45515A Adverse effect of anticoagulants, initial encounter: Secondary | ICD-10-CM | POA: Diagnosis not present

## 2014-09-24 DIAGNOSIS — N189 Chronic kidney disease, unspecified: Secondary | ICD-10-CM

## 2014-09-24 DIAGNOSIS — F419 Anxiety disorder, unspecified: Secondary | ICD-10-CM | POA: Diagnosis not present

## 2014-09-24 DIAGNOSIS — N184 Chronic kidney disease, stage 4 (severe): Secondary | ICD-10-CM

## 2014-09-24 DIAGNOSIS — I7092 Chronic total occlusion of artery of the extremities: Secondary | ICD-10-CM | POA: Diagnosis present

## 2014-09-24 DIAGNOSIS — K2100 Gastro-esophageal reflux disease with esophagitis, without bleeding: Secondary | ICD-10-CM | POA: Insufficient documentation

## 2014-09-24 DIAGNOSIS — I4891 Unspecified atrial fibrillation: Secondary | ICD-10-CM | POA: Diagnosis not present

## 2014-09-24 DIAGNOSIS — R17 Unspecified jaundice: Secondary | ICD-10-CM | POA: Diagnosis present

## 2014-09-24 DIAGNOSIS — Z89611 Acquired absence of right leg above knee: Secondary | ICD-10-CM | POA: Diagnosis not present

## 2014-09-24 DIAGNOSIS — E785 Hyperlipidemia, unspecified: Secondary | ICD-10-CM

## 2014-09-24 DIAGNOSIS — I120 Hypertensive chronic kidney disease with stage 5 chronic kidney disease or end stage renal disease: Secondary | ICD-10-CM | POA: Diagnosis present

## 2014-09-24 DIAGNOSIS — E1165 Type 2 diabetes mellitus with hyperglycemia: Secondary | ICD-10-CM | POA: Diagnosis present

## 2014-09-24 DIAGNOSIS — I2510 Atherosclerotic heart disease of native coronary artery without angina pectoris: Secondary | ICD-10-CM | POA: Diagnosis present

## 2014-09-24 DIAGNOSIS — I5022 Chronic systolic (congestive) heart failure: Secondary | ICD-10-CM | POA: Diagnosis not present

## 2014-09-24 DIAGNOSIS — I5042 Chronic combined systolic (congestive) and diastolic (congestive) heart failure: Secondary | ICD-10-CM | POA: Diagnosis not present

## 2014-09-24 DIAGNOSIS — N2581 Secondary hyperparathyroidism of renal origin: Secondary | ICD-10-CM | POA: Diagnosis present

## 2014-09-24 DIAGNOSIS — D638 Anemia in other chronic diseases classified elsewhere: Secondary | ICD-10-CM | POA: Diagnosis present

## 2014-09-24 DIAGNOSIS — Z79899 Other long term (current) drug therapy: Secondary | ICD-10-CM

## 2014-09-24 DIAGNOSIS — L97919 Non-pressure chronic ulcer of unspecified part of right lower leg with unspecified severity: Secondary | ICD-10-CM | POA: Diagnosis present

## 2014-09-24 DIAGNOSIS — E876 Hypokalemia: Secondary | ICD-10-CM | POA: Diagnosis not present

## 2014-09-24 DIAGNOSIS — I429 Cardiomyopathy, unspecified: Secondary | ICD-10-CM | POA: Diagnosis present

## 2014-09-24 DIAGNOSIS — I5040 Unspecified combined systolic (congestive) and diastolic (congestive) heart failure: Secondary | ICD-10-CM | POA: Diagnosis present

## 2014-09-24 DIAGNOSIS — E43 Unspecified severe protein-calorie malnutrition: Secondary | ICD-10-CM | POA: Diagnosis present

## 2014-09-24 DIAGNOSIS — A419 Sepsis, unspecified organism: Secondary | ICD-10-CM | POA: Diagnosis present

## 2014-09-24 DIAGNOSIS — G9341 Metabolic encephalopathy: Secondary | ICD-10-CM | POA: Diagnosis not present

## 2014-09-24 DIAGNOSIS — E860 Dehydration: Secondary | ICD-10-CM | POA: Diagnosis present

## 2014-09-24 DIAGNOSIS — L8990 Pressure ulcer of unspecified site, unspecified stage: Secondary | ICD-10-CM

## 2014-09-24 DIAGNOSIS — E1121 Type 2 diabetes mellitus with diabetic nephropathy: Secondary | ICD-10-CM | POA: Diagnosis present

## 2014-09-24 DIAGNOSIS — L03116 Cellulitis of left lower limb: Secondary | ICD-10-CM | POA: Diagnosis present

## 2014-09-24 DIAGNOSIS — L03115 Cellulitis of right lower limb: Secondary | ICD-10-CM | POA: Diagnosis present

## 2014-09-24 DIAGNOSIS — N185 Chronic kidney disease, stage 5: Secondary | ICD-10-CM | POA: Diagnosis not present

## 2014-09-24 DIAGNOSIS — I214 Non-ST elevation (NSTEMI) myocardial infarction: Secondary | ICD-10-CM

## 2014-09-24 DIAGNOSIS — K219 Gastro-esophageal reflux disease without esophagitis: Secondary | ICD-10-CM | POA: Diagnosis present

## 2014-09-24 DIAGNOSIS — Z992 Dependence on renal dialysis: Secondary | ICD-10-CM

## 2014-09-24 DIAGNOSIS — Z794 Long term (current) use of insulin: Secondary | ICD-10-CM | POA: Diagnosis not present

## 2014-09-24 DIAGNOSIS — F1010 Alcohol abuse, uncomplicated: Secondary | ICD-10-CM | POA: Diagnosis present

## 2014-09-24 DIAGNOSIS — L89152 Pressure ulcer of sacral region, stage 2: Secondary | ICD-10-CM | POA: Diagnosis present

## 2014-09-24 DIAGNOSIS — I472 Ventricular tachycardia: Secondary | ICD-10-CM | POA: Diagnosis not present

## 2014-09-24 DIAGNOSIS — Z6821 Body mass index (BMI) 21.0-21.9, adult: Secondary | ICD-10-CM | POA: Diagnosis not present

## 2014-09-24 DIAGNOSIS — I70261 Atherosclerosis of native arteries of extremities with gangrene, right leg: Secondary | ICD-10-CM | POA: Diagnosis present

## 2014-09-24 DIAGNOSIS — R0602 Shortness of breath: Secondary | ICD-10-CM

## 2014-09-24 DIAGNOSIS — Z720 Tobacco use: Secondary | ICD-10-CM

## 2014-09-24 DIAGNOSIS — T426X5A Adverse effect of other antiepileptic and sedative-hypnotic drugs, initial encounter: Secondary | ICD-10-CM | POA: Diagnosis not present

## 2014-09-24 DIAGNOSIS — Z452 Encounter for adjustment and management of vascular access device: Secondary | ICD-10-CM

## 2014-09-24 DIAGNOSIS — I96 Gangrene, not elsewhere classified: Secondary | ICD-10-CM | POA: Diagnosis present

## 2014-09-24 DIAGNOSIS — F05 Delirium due to known physiological condition: Secondary | ICD-10-CM | POA: Diagnosis not present

## 2014-09-24 DIAGNOSIS — G253 Myoclonus: Secondary | ICD-10-CM | POA: Diagnosis not present

## 2014-09-24 DIAGNOSIS — N289 Disorder of kidney and ureter, unspecified: Secondary | ICD-10-CM

## 2014-09-24 DIAGNOSIS — L0390 Cellulitis, unspecified: Secondary | ICD-10-CM | POA: Diagnosis present

## 2014-09-24 DIAGNOSIS — I739 Peripheral vascular disease, unspecified: Secondary | ICD-10-CM | POA: Diagnosis not present

## 2014-09-24 DIAGNOSIS — N179 Acute kidney failure, unspecified: Secondary | ICD-10-CM | POA: Diagnosis present

## 2014-09-24 DIAGNOSIS — F17210 Nicotine dependence, cigarettes, uncomplicated: Secondary | ICD-10-CM | POA: Diagnosis present

## 2014-09-24 DIAGNOSIS — Z95828 Presence of other vascular implants and grafts: Secondary | ICD-10-CM

## 2014-09-24 DIAGNOSIS — I5043 Acute on chronic combined systolic (congestive) and diastolic (congestive) heart failure: Secondary | ICD-10-CM | POA: Diagnosis not present

## 2014-09-24 DIAGNOSIS — T82598A Other mechanical complication of other cardiac and vascular devices and implants, initial encounter: Secondary | ICD-10-CM | POA: Diagnosis not present

## 2014-09-24 HISTORY — DX: Gastro-esophageal reflux disease without esophagitis: K21.9

## 2014-09-24 LAB — CBC WITH DIFFERENTIAL/PLATELET
BASOS PCT: 0 % (ref 0–1)
Basophils Absolute: 0 10*3/uL (ref 0.0–0.1)
EOS ABS: 0 10*3/uL (ref 0.0–0.7)
Eosinophils Relative: 0 % (ref 0–5)
HCT: 38 % — ABNORMAL LOW (ref 39.0–52.0)
HEMOGLOBIN: 12.8 g/dL — AB (ref 13.0–17.0)
Lymphocytes Relative: 4 % — ABNORMAL LOW (ref 12–46)
Lymphs Abs: 0.6 10*3/uL — ABNORMAL LOW (ref 0.7–4.0)
MCH: 29.2 pg (ref 26.0–34.0)
MCHC: 33.7 g/dL (ref 30.0–36.0)
MCV: 86.8 fL (ref 78.0–100.0)
Monocytes Absolute: 0.2 10*3/uL (ref 0.1–1.0)
Monocytes Relative: 1 % — ABNORMAL LOW (ref 3–12)
Neutro Abs: 13 10*3/uL — ABNORMAL HIGH (ref 1.7–7.7)
Neutrophils Relative %: 95 % — ABNORMAL HIGH (ref 43–77)
Platelets: 167 10*3/uL (ref 150–400)
RBC: 4.38 MIL/uL (ref 4.22–5.81)
RDW: 15 % (ref 11.5–15.5)
WBC: 13.8 10*3/uL — ABNORMAL HIGH (ref 4.0–10.5)

## 2014-09-24 LAB — COMPREHENSIVE METABOLIC PANEL
ALBUMIN: 3 g/dL — AB (ref 3.5–5.0)
ALT: 15 U/L — AB (ref 17–63)
AST: 27 U/L (ref 15–41)
Alkaline Phosphatase: 168 U/L — ABNORMAL HIGH (ref 38–126)
Anion gap: 20 — ABNORMAL HIGH (ref 5–15)
BUN: 130 mg/dL — ABNORMAL HIGH (ref 6–20)
CALCIUM: 9 mg/dL (ref 8.9–10.3)
CHLORIDE: 94 mmol/L — AB (ref 101–111)
CO2: 22 mmol/L (ref 22–32)
Creatinine, Ser: 7.95 mg/dL — ABNORMAL HIGH (ref 0.61–1.24)
GFR calc Af Amer: 8 mL/min — ABNORMAL LOW (ref >60)
GFR calc non Af Amer: 7 mL/min — ABNORMAL LOW (ref >60)
Glucose, Bld: 129 mg/dL — ABNORMAL HIGH (ref 65–99)
Potassium: 3.8 mmol/L (ref 3.5–5.1)
SODIUM: 136 mmol/L (ref 135–145)
Total Bilirubin: 4.4 mg/dL — ABNORMAL HIGH (ref 0.3–1.2)
Total Protein: 7 g/dL (ref 6.5–8.1)

## 2014-09-24 LAB — RAPID URINE DRUG SCREEN, HOSP PERFORMED
Amphetamines: NOT DETECTED
Barbiturates: NOT DETECTED
Benzodiazepines: NOT DETECTED
COCAINE: NOT DETECTED
Opiates: POSITIVE — AB
Tetrahydrocannabinol: NOT DETECTED

## 2014-09-24 LAB — I-STAT CG4 LACTIC ACID, ED
Lactic Acid, Venous: 1.85 mmol/L (ref 0.5–2.0)
Lactic Acid, Venous: 1.94 mmol/L (ref 0.5–2.0)

## 2014-09-24 LAB — URINE MICROSCOPIC-ADD ON

## 2014-09-24 LAB — URINALYSIS, ROUTINE W REFLEX MICROSCOPIC
Glucose, UA: NEGATIVE mg/dL
KETONES UR: NEGATIVE mg/dL
Nitrite: NEGATIVE
PH: 5 (ref 5.0–8.0)
Protein, ur: 100 mg/dL — AB
Specific Gravity, Urine: 1.013 (ref 1.005–1.030)
Urobilinogen, UA: 1 mg/dL (ref 0.0–1.0)

## 2014-09-24 LAB — PROTIME-INR
INR: 1.39 (ref 0.00–1.49)
PROTHROMBIN TIME: 17.1 s — AB (ref 11.6–15.2)

## 2014-09-24 LAB — APTT: APTT: 39 s — AB (ref 24–37)

## 2014-09-24 MED ORDER — INSULIN DETEMIR 100 UNIT/ML ~~LOC~~ SOLN
3.0000 [IU] | Freq: Every day | SUBCUTANEOUS | Status: DC
Start: 2014-09-25 — End: 2014-09-29
  Administered 2014-09-25 – 2014-09-27 (×3): 3 [IU] via SUBCUTANEOUS
  Filled 2014-09-24 (×7): qty 0.03

## 2014-09-24 MED ORDER — CALCIUM CARBONATE ANTACID 500 MG PO CHEW
1.0000 | CHEWABLE_TABLET | Freq: Once | ORAL | Status: AC
  Administered 2014-09-24: 200 mg via ORAL
  Filled 2014-09-24: qty 1

## 2014-09-24 MED ORDER — LORAZEPAM 2 MG/ML IJ SOLN
1.0000 mg | Freq: Four times a day (QID) | INTRAMUSCULAR | Status: DC | PRN
  Administered 2014-09-26: 1 mg via INTRAVENOUS
  Filled 2014-09-24 (×2): qty 1

## 2014-09-24 MED ORDER — ACETAMINOPHEN 650 MG RE SUPP: 650.0000 mg | Freq: Four times a day (QID) | RECTAL | Status: DC | PRN

## 2014-09-24 MED ORDER — VITAMIN B-1 100 MG PO TABS
100.0000 mg | ORAL_TABLET | Freq: Every day | ORAL | Status: DC
  Administered 2014-09-25 – 2014-09-28 (×3): 100 mg via ORAL
  Filled 2014-09-24 (×5): qty 1

## 2014-09-24 MED ORDER — MORPHINE SULFATE 4 MG/ML IJ SOLN
4.0000 mg | Freq: Once | INTRAMUSCULAR | Status: AC
  Administered 2014-09-24: 4 mg via INTRAVENOUS
  Filled 2014-09-24: qty 1

## 2014-09-24 MED ORDER — FOLIC ACID 1 MG PO TABS
1.0000 mg | ORAL_TABLET | Freq: Every day | ORAL | Status: DC
  Administered 2014-09-25 – 2014-09-28 (×4): 1 mg via ORAL
  Filled 2014-09-24 (×5): qty 1

## 2014-09-24 MED ORDER — PIPERACILLIN-TAZOBACTAM 3.375 G IVPB 30 MIN
3.3750 g | Freq: Once | INTRAVENOUS | Status: AC
  Administered 2014-09-24: 3.375 g via INTRAVENOUS
  Filled 2014-09-24: qty 50

## 2014-09-24 MED ORDER — IRON 325 (65 FE) MG PO TABS: 325.0000 mg | ORAL_TABLET | Freq: Every day | ORAL | Status: DC

## 2014-09-24 MED ORDER — PIPERACILLIN-TAZOBACTAM IN DEX 2-0.25 GM/50ML IV SOLN
2.2500 g | Freq: Three times a day (TID) | INTRAVENOUS | Status: DC
  Administered 2014-09-25 – 2014-10-08 (×41): 2.25 g via INTRAVENOUS
  Filled 2014-09-24 (×48): qty 50

## 2014-09-24 MED ORDER — ADULT MULTIVITAMIN W/MINERALS CH
1.0000 | ORAL_TABLET | Freq: Every day | ORAL | Status: DC
  Administered 2014-09-25 – 2014-09-28 (×4): 1 via ORAL
  Filled 2014-09-24 (×5): qty 1

## 2014-09-24 MED ORDER — HEPARIN SODIUM (PORCINE) 5000 UNIT/ML IJ SOLN
5000.0000 [IU] | Freq: Three times a day (TID) | INTRAMUSCULAR | Status: DC
  Administered 2014-09-25 – 2014-09-27 (×8): 5000 [IU] via SUBCUTANEOUS
  Filled 2014-09-24 (×9): qty 1

## 2014-09-24 MED ORDER — POLYETHYLENE GLYCOL 3350 17 G PO PACK
17.0000 g | PACK | Freq: Every day | ORAL | Status: DC | PRN
  Filled 2014-09-24: qty 1

## 2014-09-24 MED ORDER — SODIUM CHLORIDE 0.9 % IJ SOLN
3.0000 mL | Freq: Two times a day (BID) | INTRAMUSCULAR | Status: DC
  Administered 2014-09-25 – 2014-10-17 (×27): 3 mL via INTRAVENOUS

## 2014-09-24 MED ORDER — METOPROLOL TARTRATE 50 MG PO TABS
50.0000 mg | ORAL_TABLET | Freq: Two times a day (BID) | ORAL | Status: DC
  Administered 2014-09-25 – 2014-09-28 (×8): 50 mg via ORAL
  Filled 2014-09-24 (×7): qty 1
  Filled 2014-09-24: qty 2
  Filled 2014-09-24: qty 1
  Filled 2014-09-24: qty 2

## 2014-09-24 MED ORDER — LORAZEPAM 2 MG/ML IJ SOLN
0.0000 mg | Freq: Four times a day (QID) | INTRAMUSCULAR | Status: DC
  Administered 2014-09-25: 2 mg via INTRAVENOUS
  Administered 2014-09-26 (×2): 1 mg via INTRAVENOUS
  Filled 2014-09-24 (×2): qty 1

## 2014-09-24 MED ORDER — PIPERACILLIN-TAZOBACTAM IN DEX 2-0.25 GM/50ML IV SOLN
2.2500 g | Freq: Four times a day (QID) | INTRAVENOUS | Status: DC
  Filled 2014-09-24 (×2): qty 50

## 2014-09-24 MED ORDER — HYDROXYZINE HCL 50 MG/ML IM SOLN
25.0000 mg | Freq: Four times a day (QID) | INTRAMUSCULAR | Status: DC | PRN
Start: 2014-09-24 — End: 2014-09-29
  Filled 2014-09-24: qty 0.5

## 2014-09-24 MED ORDER — SODIUM CHLORIDE 0.9 % IV BOLUS (SEPSIS)
1000.0000 mL | INTRAVENOUS | Status: DC
  Administered 2014-09-24 (×2): 1000 mL via INTRAVENOUS

## 2014-09-24 MED ORDER — DOCUSATE SODIUM 100 MG PO CAPS
100.0000 mg | ORAL_CAPSULE | Freq: Two times a day (BID) | ORAL | Status: DC | PRN
  Administered 2014-09-28: 100 mg via ORAL
  Filled 2014-09-24 (×2): qty 1

## 2014-09-24 MED ORDER — LORAZEPAM 2 MG/ML IJ SOLN: 0.0000 mg | Freq: Two times a day (BID) | INTRAMUSCULAR | Status: DC

## 2014-09-24 MED ORDER — OXYCODONE HCL 10 MG PO TABS: 10.0000 mg | ORAL_TABLET | Freq: Four times a day (QID) | ORAL | Status: DC | PRN

## 2014-09-24 MED ORDER — SODIUM CHLORIDE 0.9 % IV SOLN
INTRAVENOUS | Status: DC
  Administered 2014-09-24: via INTRAVENOUS

## 2014-09-24 MED ORDER — MORPHINE SULFATE 2 MG/ML IJ SOLN
2.0000 mg | INTRAMUSCULAR | Status: DC | PRN
  Administered 2014-09-28 – 2014-10-03 (×15): 2 mg via INTRAVENOUS
  Filled 2014-09-24 (×17): qty 1

## 2014-09-24 MED ORDER — VANCOMYCIN HCL IN DEXTROSE 1-5 GM/200ML-% IV SOLN
1000.0000 mg | Freq: Once | INTRAVENOUS | Status: AC
  Administered 2014-09-24: 1000 mg via INTRAVENOUS
  Filled 2014-09-24: qty 200

## 2014-09-24 MED ORDER — ISOSORBIDE MONONITRATE ER 60 MG PO TB24
60.0000 mg | ORAL_TABLET | Freq: Every day | ORAL | Status: DC
  Administered 2014-09-26 – 2014-09-28 (×3): 60 mg via ORAL
  Filled 2014-09-24 (×5): qty 1

## 2014-09-24 MED ORDER — HYDRALAZINE HCL 20 MG/ML IJ SOLN
5.0000 mg | INTRAMUSCULAR | Status: DC | PRN
  Filled 2014-09-24: qty 1

## 2014-09-24 MED ORDER — CALCIUM CARBONATE ANTACID 750 MG PO CHEW: 1.0000 | CHEWABLE_TABLET | ORAL | Status: DC | PRN

## 2014-09-24 MED ORDER — NITROGLYCERIN 0.4 MG SL SUBL: 0.4000 mg | SUBLINGUAL_TABLET | SUBLINGUAL | Status: DC | PRN

## 2014-09-24 MED ORDER — THIAMINE HCL 100 MG/ML IJ SOLN
100.0000 mg | Freq: Every day | INTRAMUSCULAR | Status: DC
  Administered 2014-09-27 – 2014-10-03 (×5): 100 mg via INTRAVENOUS
  Filled 2014-09-24 (×10): qty 1

## 2014-09-24 MED ORDER — SODIUM BICARBONATE 650 MG PO TABS
650.0000 mg | ORAL_TABLET | Freq: Two times a day (BID) | ORAL | Status: DC
  Administered 2014-09-25 (×2): 650 mg via ORAL
  Filled 2014-09-24 (×7): qty 1

## 2014-09-24 MED ORDER — SODIUM CHLORIDE 0.9 % IV SOLN
INTRAVENOUS | Status: DC
  Administered 2014-09-25: 50 mL/h via INTRAVENOUS
  Administered 2014-09-26 – 2014-09-28 (×2): via INTRAVENOUS

## 2014-09-24 MED ORDER — PANTOPRAZOLE SODIUM 40 MG PO TBEC
40.0000 mg | DELAYED_RELEASE_TABLET | Freq: Every day | ORAL | Status: DC
  Administered 2014-09-25 – 2014-09-26 (×2): 40 mg via ORAL
  Filled 2014-09-24 (×2): qty 1

## 2014-09-24 MED ORDER — PIPERACILLIN-TAZOBACTAM IN DEX 2-0.25 GM/50ML IV SOLN
2.2500 g | Freq: Three times a day (TID) | INTRAVENOUS | Status: DC
  Filled 2014-09-24 (×2): qty 50

## 2014-09-24 MED ORDER — ACETAMINOPHEN 325 MG PO TABS: 650.0000 mg | ORAL_TABLET | Freq: Four times a day (QID) | ORAL | Status: DC | PRN

## 2014-09-24 MED ORDER — LORAZEPAM 1 MG PO TABS: 1.0000 mg | ORAL_TABLET | Freq: Four times a day (QID) | ORAL | Status: DC | PRN

## 2014-09-24 MED ORDER — VANCOMYCIN HCL IN DEXTROSE 750-5 MG/150ML-% IV SOLN
750.0000 mg | Freq: Once | INTRAVENOUS | Status: AC
  Administered 2014-09-24: 750 mg via INTRAVENOUS
  Filled 2014-09-24 (×2): qty 150

## 2014-09-24 NOTE — H&P (Addendum)
[facility] History and Physical  [name] JanFrank Vahey ZOX:[id number] DOB: [birth year] DOA: 09/24/2014  Referring physician: ED physician PCP: Dema SeverinYORK,REGINA F, NP  Specialists:   Chief Complaint: Right leg pain and swelling  HPI: [name] is a 49 y.o. 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  Patient is a poor historian. It seems that he has been having right leg pain and swelling in the past several days. His leg becomes red from foot to upper thigh medially. He does not have fever or chills. Patient was unable to ambulate over the past 2 days secondary to pain on the right leg. He reports a generalized weakness. States he has decreased urinary output and constipation, states he has not been eating and drinking regularly. Patient states that he is living with his mother.  In ED, patient was found to have WBC 13.8, tachycardia, normal temperature, stable blood pressure, AoCKD-IV, negative chest x-ray for acute abnormalities, ALP 168, AST 27, ALT 15, total bilirubin 4.4, lactate 1.85, INR 1.39, urinalysis with small amount of leukocytes. Patient is admitted to inpatient for further evaluation and treatment. Renal was consulted by ED  Where does patient live?   At home   Can patient participate in ADLs?  Some   Review of Systems:   General: no fevers, chills, no changes in body weight, has poor appetite, has fatigue HEENT: no blurry vision, hearing changes or sore throat Pulm: no dyspnea, coughing, wheezing CV: no chest pain, palpitations Abd: no nausea, vomiting, abdominal pain, diarrhea, constipation GU: no dysuria, burning on urination, increased urinary frequency, hematuria  Ext: has right leg pain and swelling Neuro: no  unilateral weakness, numbness, or tingling, no vision change or hearing loss Skin: no rash MSK: No muscle spasm, no deformity, no limitation of range of movement in spin Heme: No easy bruising.  Travel history: No recent long distant travel.  Allergy: No Known Allergies  Past Medical History  Diagnosis Date  . Coronary artery disease 07/05/2011    2D ECHO - EF ~40%, moderate concentric LV hypertrophy, LA moderately dilated, mild-moderate septal and inferior wall hypokinesis  . Hypertension   . CHF (congestive heart failure)   . Diabetes mellitus     Insulin dependent  . Cardiomyopathy   . Chronic kidney disease (CKD), stage IV (severe)   . Claudication 05/09/2011    Right and left anterior tibial arteries and Left SFA-occluded; Right CIA-<50% diameter reduction; Right Deep Profunda-70-99% diameter reduction; Right SFA->60% diameter reduction; Right Distal Popliteal/Tibial Artery- >60% diameter reduction; Left CFA and Profunda- >50% diameter reduction  . 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 GERD (gastroesophageal reflux disease)     Past Surgical History  Procedure Laterality Date  . Cardiac catheterization  05/07/2008    CABG  . Coronary artery bypass graft  06/2008    x3    Social History:  reports that he quit smoking about 12 months ago. His smoking use included Cigarettes. He has a 12.5 pack-year smoking history. He does not have any smokeless tobacco history on file. He reports that he drinks alcohol. He reports that he uses illicit drugs (  Marijuana).  Family History: No family history on file. asked, but patient could not provide any family medical history  Prior to Admission medications   Medication Sig Start Date End Date Taking? Authorizing Provider  calcium carbonate (TUMS EX) 750 MG  chewable tablet Chew 1 tablet by mouth as needed for heartburn.   Yes Historical Provider, MD  Ferrous Sulfate (IRON) 325 (65 FE) MG TABS Take 325 mg by mouth daily.   Yes Historical Provider, MD  furosemide (LASIX) 80 MG tablet Take 1 tablet (80 mg total) by mouth daily. 02/10/14  Yes Mihai Croitoru, MD  hydrALAZINE (APRESOLINE) 50 MG tablet Take 1 tablet (50 mg total) by mouth every 8 (eight) hours. 11/07/13  Yes Mihai Croitoru, MD  isosorbide mononitrate (IMDUR) 60 MG 24 hr tablet Take 1 tablet (60 mg total) by mouth daily. 03/17/14  Yes Mihai Croitoru, MD  LEVEMIR 100 UNIT/ML injection Inject 5 Units into the skin daily before breakfast. 05/12/14  Yes Historical Provider, MD  metoprolol (LOPRESSOR) 50 MG tablet Take 1 tablet (50 mg total) by mouth 2 (two) times daily. 03/13/13  Yes Mihai Croitoru, MD  Oxycodone HCl 10 MG TABS Take 1 tablet by mouth every 6 (six) hours as needed. 07/16/14  Yes Historical Provider, MD  pantoprazole (PROTONIX) 40 MG tablet Take 40 mg by mouth daily.  05/08/13  Yes Historical Provider, MD  sodium bicarbonate 650 MG tablet Take 650 mg by mouth 2 (two) times daily.   Yes Historical Provider, MD  acyclovir (ZOVIRAX) 800 MG tablet Take 1 tablet (800 mg total) by mouth 5 (five) times daily. Patient not taking: Reported on 09/24/2014 03/11/14   Gilda Crease, MD  nitroGLYCERIN (NITROSTAT) 0.4 MG SL tablet Place 1 tablet (0.4 mg total) under the tongue once. 10/01/13   Thurmon Fair, MD    Physical Exam: Filed Vitals:   09/24/14 2045 09/24/14 2100 09/24/14 2130 09/24/14 2200  BP: 139/89 131/69 132/78 139/92  Pulse:   120   Temp:      TempSrc:      Resp: 19 18 17 18   Height:      Weight:      SpO2:  100% 100%    General: Not in acute distress, dry mucous and membrane HEENT:       Eyes: PERRL, EOMI, no scleral icterus.       ENT: No discharge from the ears and nose, no pharynx injection, no tonsillar enlargement.        Neck: No JVD, no bruit, no mass  felt. Heme: No neck lymph node enlargement. Cardiac: S1/S2, RRR, tachycardia, No murmurs, No gallops or rubs. Pulm: Good air movement bilaterally. No rales, wheezing, rhonchi or rubs. Abd: Soft, nondistended, nontender, no rebound pain, no organomegaly, BS present. Ext: has swelling in right leg.  Musculoskeletal: No joint deformities, No joint redness or warmth, no limitation of ROM in spin. Skin: There is swelling, warthm and redness in right leg from foot to upper medial thigh, has several bullous lesions. Dorsalis pedis is not palpable or heard on Doppler. Both feet are cool. Posterior tibial could be appreciated on Doppler per ED. Range of motion to toes, distal sensation is grossly intact.  Neuro: Alert, oriented X3, cranial nerves II-XII grossly intact, muscle strength 5/5 in all extremities, sensation to light touch intact. Psych: Patient is not psychotic, no suicidal or hemocidal ideation.  Labs on Admission:  Basic Metabolic Panel:  Recent Labs Lab 09/24/14 1905  NA 136  K 3.8  CL 94*  CO2 22  GLUCOSE 129*  BUN 130*  CREATININE 7.95*  CALCIUM 9.0   Liver Function Tests:  Recent Labs Lab 09/24/14 1905  AST 27  ALT 15*  ALKPHOS 168*  BILITOT 4.4*  PROT 7.0  ALBUMIN 3.0*   No results for input(s): LIPASE, AMYLASE in the last 168 hours. No results for input(s): AMMONIA in the last 168 hours. CBC:  Recent Labs Lab 09/24/14 1905  WBC 13.8*  NEUTROABS 13.0*  HGB 12.8*  HCT 38.0*  MCV 86.8  PLT 167   Cardiac Enzymes: No results for input(s): CKTOTAL, CKMB, CKMBINDEX, TROPONINI in the last 168 hours.  BNP (last 3 results) No results for input(s): BNP in the last 8760 hours.  ProBNP (last 3 results) No results for input(s): PROBNP in the last 8760 hours.  CBG: No results for input(s): GLUCAP in the last 168 hours.  Radiological Exams on Admission: Dg Chest Port 1 View  09/24/2014   CLINICAL DATA:  Weakness and lower extremity edema.  EXAM: PORTABLE  CHEST - 1 VIEW  COMPARISON:  09/28/2013  FINDINGS: Stable mild cardiac enlargement and evidence of prior CABG. Lung volumes are low bilaterally. There is no evidence of pulmonary edema, consolidation, pneumothorax, nodule or pleural fluid.  IMPRESSION: Stable mild cardiomegaly.  Low lung volumes without acute findings.   Electronically Signed   By: Irish Lack M.D.   On: 09/24/2014 19:38    EKG: Independently reviewed.  Abnormal findings: QTc interval 538, tachycardia, left axis deviation. Left bundle blockage which existed on previous EKG on 08/07/14.   Assessment/Plan Principal Problem:   Cellulitis of right leg Active Problems:   S/P CABG x 3:  March 2010: left internal mammary artery to distal left anterior descending, SVG to Circ marginal vessel, SVG to PDA   HTN (hypertension)   Hyperlipidemia   DM2 (diabetes mellitus, type 2)   Acute renal failure superimposed on stage 4 chronic kidney disease   CAD (coronary artery disease)   NSTEMI (non-ST elevated myocardial infarction)   PAD (peripheral artery disease)   GERD (gastroesophageal reflux disease)   Combined systolic and diastolic congestive heart failure   Sepsis   Cellulitis  Cellulitis of right leg and sepsis: The patient presents with erythema, redness, and swelling over right leg. This is most consistent with clinical cellulitis. He is septic on admission, with leukocytosis and tachycardia. Hemodynamically stable currently.  - Admit to SDU given multiple comorbidities - Empiric antimicrobial treatment with vancomycin IV and Zosyn - IVF: 1 L NS bolus -->then followed by 75 cc/h (patient's EF 20 to 25% and AoCKD-IV, limiting aggressive IVF) - Blood cultures x 2 - Symptomatic treatment: prn Tylenol for fever, prn Hydroxyzine for nausea, morphine and oxycodone for pain.  - will get Procalcitonin and trend lactic acid level per sepsis protocol  PAD: Patient had arterial Doppler 2013, which showed Right and left anterior tibial  arteries and Left SFA-occluded; Right CIA-<50% diameter reduction; Right Deep Profunda-70-99% diameter reduction; Right SFA->60% diameter reduction; Right Distal Popliteal/Tibial Artery- >60% diameter reduction; Left CFA and Profunda- >50% diameter reduction. -may need to consult to vascular surgeon or follow up with them.  CAD and s/p of CABG 2010: No chest pain. EKG showed old left bundle blockage. -continue Imdure, metoprol, prn NTG  GERD: -Protonix  HTN: -hold oral hydralazine since patient is at risk of developing hypotension. -IV hydralazine when necessary -Continue metoprolol  Hyperlipidemia: Last LDL was 161 on 03/20/11. Not on medications at home. -Check FLP  DM-II: Last A1c 10.5 on 03/19/11. Patient is taking Levemir 5 units daily at home -will decrease Levemir dose from 5 to 3 units daily -SSI -Check A1c  Acute renal failure superimposed on stage 4 chronic kidney disease: Previous creatinine was a 5.85 on 03/11/14. His creatinine is a 7.95, BUN 1:30 on admission. Bicarbonate 22, potassium 3.8. Likely due to sepsis and prerenal secondary to dehydration and continuation of diruetics. Renal was consulted by ED. -continue bicarbonate -Follow-up renal Recs -IVF as above -Check FeUrea -US-renal -Follow up renal function by BMP -Avoid ACEI and NSAIDs -Hold Diuretics now  Combined systolic and diastolic congestive heart failure: 2-D echo on 02/21/14 showed EF 20-25% with grade 3 diastolic dysfunction. Patient is on Lasix 80 mg daily. He is clinically dry on admission. Patient is at risk for sudden cardiac death given his low EF. He has been followed by cardiologist. He was seen by Dr. Royann Shivers on 08/07/14. Per Dr. Royann Shivers, the decisions regarding cardiac device therapy is depending heavily on his decision regarding dialysis. Subcutaneous ICD was considered if he starts dialysis. - Hold her Lasix due to A0CKD-IV and sepsis -Check BNP - follow up with card  DVT ppx: SQ Heparin       Code Status: Full code Family Communication: None at bed side.    Disposition Plan: Admit to inpatient   Date of Service 09/24/2014    Lorretta Harp [facility] Pager [phone]  If 7PM-7AM, please contact night-coverage [email or web address] Password [email or web address] 09/24/2014, 10:18 PM

## 2014-09-24 NOTE — Progress Notes (Signed)
ANTIBIOTIC CONSULT NOTE - INITIAL  Pharmacy Consult for vancomycin + zosyn Indication: cellulitis + sepsis  No Known Allergies  Patient Measurements: Height: 5\' 11"  (180.3 cm) Weight: 190 lb (86.183 kg) IBW/kg (Calculated) : 75.3 Adjusted Body Weight:   Vital Signs: Temp: 97.8 F (36.6 C) (06/01 1856) Temp Source: Oral (06/01 1856) BP: 148/97 mmHg (06/01 2002) Pulse Rate: 107 (06/01 2002) Intake/Output from previous day:   Intake/Output from this shift: Total I/O In: 1050 [I.V.:1050] Out: -   Labs:  Recent Labs  09/24/14 1905  WBC 13.8*  HGB 12.8*  PLT 167  CREATININE 7.95*   Estimated Creatinine Clearance: 11.8 mL/min (by C-G formula based on Cr of 7.95). No results for input(s): VANCOTROUGH, VANCOPEAK, VANCORANDOM, GENTTROUGH, GENTPEAK, GENTRANDOM, TOBRATROUGH, TOBRAPEAK, TOBRARND, AMIKACINPEAK, AMIKACINTROU, AMIKACIN in the last 72 hours.   Microbiology: No results found for this or any previous visit (from the past 720 hour(s)).  Medical History: Past Medical History  Diagnosis Date  . Coronary artery disease 07/05/2011    2D ECHO - EF ~40%, moderate concentric LV hypertrophy, LA moderately dilated, mild-moderate septal and inferior wall hypokinesis  . Hypertension   . CHF (congestive heart failure)   . Diabetes mellitus     Insulin dependent  . Cardiomyopathy   . Chronic kidney disease (CKD), stage IV (severe)   . Claudication 05/09/2011    Right and left anterior tibial arteries and Left SFA-occluded; Right CIA-<50% diameter reduction; Right Deep Profunda-70-99% diameter reduction; Right SFA->60% diameter reduction; Right Distal Popliteal/Tibial Artery- >60% diameter reduction; Left CFA and Profunda- >50% diameter reduction  . S/P CABG (coronary artery bypass graft) 03/23/2011`    STRESS TEST - LV EF 29%, mild reversible within the apical segment of the anterior and anterolateral wall, small infarct in the inferolateral wall, global hypokinesia     Medications:  Anti-infectives    Start     Dose/Rate Route Frequency Ordered Stop   09/25/14 0200  piperacillin-tazobactam (ZOSYN) IVPB 2.25 g     2.25 g 100 mL/hr over 30 Minutes Intravenous Every 6 hours 09/24/14 2030     09/24/14 2100  vancomycin (VANCOCIN) IVPB 750 mg/150 ml premix     750 mg 150 mL/hr over 60 Minutes Intravenous  Once 09/24/14 2029     09/24/14 1915  piperacillin-tazobactam (ZOSYN) IVPB 3.375 g     3.375 g 100 mL/hr over 30 Minutes Intravenous  Once 09/24/14 1911 09/24/14 2010   09/24/14 1915  vancomycin (VANCOCIN) IVPB 1000 mg/200 mL premix     1,000 mg 200 mL/hr over 60 Minutes Intravenous  Once 09/24/14 1911       Assessment: 49 yom presented with progressive weakness and bilateral leg swelling with weeping edema. To start empiric vancomycin + zosyn for sepsis from cellulitis. Pt is afebrile and WBC is elevated at 13.8. Scr is elevated at 7.95 which is above his baseline. Lactic acid is 1.85.   Vanc 6/1>> Zosyn 6/1>>  Goal of Therapy:  Vancomycin trough level 10-15 mcg/ml  Plan:  - Zosyn 3.375gm IV x 1 then 2.25gm IV Q6H - Vanc 1750mg  IV x 1 (1000gm dose + 750mg  supplemental dose) - F/u renal fxn for further vanc doses - [facility]  Arabell Neria, Drake Leachachel Lynn 09/24/2014,8:30 PM

## 2014-09-24 NOTE — ED Notes (Signed)
DR.Niu at bedside

## 2014-09-24 NOTE — ED Provider Notes (Signed)
CSN: [id number]     Arrival date & time 09/24/14  1841 History   First MD Initiated Contact with Patient 09/24/14 1905     Chief Complaint  Patient presents with  . Weakness     (Consider location/radiation/quality/duration/timing/severity/associated sxs/prior Treatment) HPI  [name] is a 49 y.o. male with past medical history significant for CABG, CK D, CHF with ejection fraction of 25%, lower extremity claudication complaining of generalized weakness, swelling to lower extremities, patient is been unable to ambulate over the past 2 days secondary to pain on the right side. He reports a generalized weakness. Denies fever, chills, nausea, vomiting, alcohol use. States he has decreased urinary output and constipation, states he has not been eating and drinking regularly. Patient states that he has a stable place to live and is not homeless.  PCP: Nephrologist in [facility]  Past Medical History  Diagnosis Date  . Coronary artery disease 07/05/2011    2D ECHO - EF ~40%, moderate concentric LV hypertrophy, LA moderately dilated, mild-moderate septal and inferior wall hypokinesis  . Hypertension   . CHF (congestive heart failure)   . Diabetes mellitus     Insulin dependent  . Cardiomyopathy   . Chronic kidney disease (CKD), stage IV (severe)   . Claudication 05/09/2011    Right and left anterior tibial arteries and Left SFA-occluded; Right CIA-<50% diameter reduction; Right Deep Profunda-70-99% diameter reduction; Right SFA->60% diameter reduction; Right Distal Popliteal/Tibial Artery- >60% diameter reduction; Left CFA and Profunda- >50% diameter reduction  . 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 Cardiac catheterization  05/07/2008    CABG  . Coronary  artery bypass graft  06/2008    x3   No family history on file. History  Substance Use Topics  . Smoking status: Former Smoker -- 0.50 packs/day for 25 years    Types: Cigarettes    Quit date: 09/03/2013  . Smokeless tobacco: Not on file  . Alcohol Use: Yes     Comment: occas.    Review of Systems  10 systems reviewed and found to be negative, except as noted in the HPI.   Allergies  Review of patient's allergies indicates no known allergies.  Home Medications   Prior to Admission medications   Medication Sig Start Date End Date Taking? Authorizing Provider  acyclovir (ZOVIRAX) 800 MG tablet Take 1 tablet (800 mg total) by mouth 5 (five) times daily. 03/11/14   Gilda Crease, MD  calcium carbonate (TUMS EX) 750 MG chewable tablet Chew 1 tablet by mouth as needed for heartburn.    Historical Provider, MD  Ferrous Sulfate (IRON) 325 (65 FE) MG TABS Take 325 mg by mouth daily.    Historical Provider, MD  furosemide (LASIX) 80 MG tablet Take 1 tablet (80 mg total) by mouth daily. 02/10/14   Mihai Croitoru, MD  hydrALAZINE (APRESOLINE) 50 MG tablet Take 1 tablet (50 mg total) by mouth every 8 (eight) hours. 11/07/13   Mihai Croitoru, MD  isosorbide mononitrate (IMDUR) 60 MG 24 hr tablet Take 1 tablet (60 mg total) by mouth daily. 03/17/14   Mihai Croitoru, MD  LEVEMIR 100 UNIT/ML injection Inject 5 Units into the skin daily before breakfast. 05/12/14   Historical Provider, MD  metoprolol (LOPRESSOR) 50 MG tablet  Take 1 tablet (50 mg total) by mouth 2 (two) times daily. 03/13/13   Mihai Croitoru, MD  nitroGLYCERIN (NITROSTAT) 0.4 MG SL tablet Place 1 tablet (0.4 mg total) under the tongue once. 10/01/13   Mihai Croitoru, MD  Oxycodone HCl 10 MG TABS Take 1 tablet by mouth every 6 (six) hours as needed. 07/16/14   Historical Provider, MD  pantoprazole (PROTONIX) 40 MG tablet Take 40 mg by mouth daily.  05/08/13   Historical Provider, MD  sodium bicarbonate 650 MG tablet Take 650 mg by  mouth 2 (two) times daily.    Historical Provider, MD   BP 148/97 mmHg  Pulse 107  Temp(Src) 97.8 F (36.6 C) (Oral)  Resp 18  Ht  (1.803 m)  Wt 190 lb (86.183 kg)  BMI 26.51 kg/m2  SpO2 98% Physical Exam  Constitutional: He is oriented to person, place, and time. He appears well-developed and well-nourished. No distress.  HENT:  Head: Normocephalic.  Dry MM  Eyes: Conjunctivae and EOM are normal.  Cardiovascular:  Tachycardic  Pulmonary/Chest: Effort normal and breath sounds normal. No stridor.  Abdominal: Soft.  Musculoskeletal: Normal range of motion. He exhibits edema and tenderness.  Right lower extremity with erythema and mild warmth especially to the medial side of the leg, distal leg is losing, patient has several bullous lesions. Dorsalis pedis is not palpable or heard on Doppler. Posterior tibial can be appreciated on Doppler. Range of motion to toes, distal sensation is grossly intact. Cap refill is brisk  Neurological: He is alert and oriented to person, place, and time.  Skin:  Mild jaundice  Psychiatric: He has a normal mood and affect.  Nursing note and vitals reviewed.         ED Course  Procedures (including critical care time)  CRITICAL CARE Performed by: Wynetta Emery   Total critical care time: 45  Critical care time was exclusive of separately billable procedures and treating other patients.  Critical care was necessary to treat or prevent imminent or life-threatening deterioration.  Critical care was time spent personally by me on the following activities: development of treatment plan with patient and/or surrogate as well as nursing, discussions with consultants, evaluation of patient's response to treatment, examination of patient, obtaining history from patient or surrogate, ordering and performing treatments and interventions, ordering and review of laboratory studies, ordering and review of radiographic studies, pulse oximetry and  re-evaluation of patient's condition.   Labs Review Labs Reviewed  CBC WITH DIFFERENTIAL/PLATELET - Abnormal; Notable for the following:    WBC 13.8 (*)    Hemoglobin 12.8 (*)    HCT 38.0 (*)    Neutrophils Relative % 95 (*)    Neutro Abs 13.0 (*)    Lymphocytes Relative 4 (*)    Lymphs Abs 0.6 (*)    Monocytes Relative 1 (*)    All other components within normal limits  COMPREHENSIVE METABOLIC PANEL - Abnormal; Notable for the following:    Chloride 94 (*)    Glucose, Bld 129 (*)    BUN 130 (*)    Creatinine, Ser 7.95 (*)    Albumin 3.0 (*)    ALT 15 (*)    Alkaline Phosphatase 168 (*)    Total Bilirubin 4.4 (*)    GFR calc non Af Amer 7 (*)    GFR calc Af Amer 8 (*)    Anion gap 20 (*)    All other components within normal limits  URINALYSIS, ROUTINE W REFLEX MICROSCOPIC (  NOT AT [facility]) - Abnormal; Notable for the following:    APPearance CLOUDY (*)    Hgb urine dipstick SMALL (*)    Bilirubin Urine SMALL (*)    Protein, ur 100 (*)    Leukocytes, UA SMALL (*)    All other components within normal limits  APTT - Abnormal; Notable for the following:    aPTT 39 (*)    All other components within normal limits  PROTIME-INR - Abnormal; Notable for the following:    Prothrombin Time 17.1 (*)    All other components within normal limits  CULTURE, BLOOD (ROUTINE X 2)  CULTURE, BLOOD (ROUTINE X 2)  URINE CULTURE  URINE MICROSCOPIC-ADD ON  URINE RAPID DRUG SCREEN (HOSP PERFORMED) NOT AT [facility]  I-STAT CG4 LACTIC ACID, ED    Imaging Review Dg Chest Port 1 View  09/24/2014   CLINICAL DATA:  Weakness and lower extremity edema.  EXAM: PORTABLE CHEST - 1 VIEW  COMPARISON:  09/28/2013  FINDINGS: Stable mild cardiac enlargement and evidence of prior CABG. Lung volumes are low bilaterally. There is no evidence of pulmonary edema, consolidation, pneumothorax, nodule or pleural fluid.  IMPRESSION: Stable mild cardiomegaly.  Low lung volumes without acute findings.   Electronically Signed    By: Irish LackGlenn  Yamagata M.D.   On: 09/24/2014 19:38     EKG Interpretation   Date/Time:  Wednesday September 24 2014 18:54:14 EDT Ventricular Rate:  141 PR Interval:  132 QRS Duration: 124 QT Interval:  351 QTC Calculation: 538 R Axis:   -60 Text Interpretation:  Sinus tachycardia with irregular rate Left bundle  branch block Since last tracing Left bundle branch block is new Confirmed  by Effie ShyWENTZ  MD, ELLIOTT [phone]([id number]) on 09/24/2014 7:06:34 PM      MDM   Final diagnoses:  Sepsis affecting skin  PAD (peripheral artery disease)  S/P CABG x 3:  March 2010: left internal mammary artery to distal left anterior descending, SVG to Circ marginal vessel, SVG to PDA  Hyperlipidemia  Type 2 diabetes mellitus with complication  NSTEMI (non-ST elevated myocardial infarction)  Chronic renal insufficiency, unspecified stage  Tobacco abuse    Filed Vitals:   09/24/14 1856 09/24/14 1930 09/24/14 1945 09/24/14 2002  BP: 143/107 148/75 131/87 148/97  Pulse: 130 122  107  Temp: 97.8 F (36.6 C)     TempSrc: Oral     Resp: 15 18 18 18   Height:      Weight:      SpO2: 100% 99%  98%    Medications  sodium chloride 0.9 % bolus 1,000 mL (1,000 mLs Intravenous New Bag/Given 09/24/14 2006)  vancomycin (VANCOCIN) IVPB 1000 mg/200 mL premix (1,000 mg Intravenous New Bag/Given 09/24/14 2002)  piperacillin-tazobactam (ZOSYN) IVPB 3.375 g (0 g Intravenous Stopped 09/24/14 2010)    Felecia JanFrank Modi is a pleasant 49 y.o. male presenting with right lower extremity edema, erythema, oozing and bullous lesions. Patient is tachycardic around the 150s. Appears clinically very dehydrated. Patient has chronic renal insufficiency, he is not on dialysis. Distal pulses are present in the affected limb. This protocol was initiated and patient is started on vancomycin and Zosyn for soft tissue infection. He has a normal lactic acid however his white blood cell count is 13.8. Creatinine is bumped to 7.95. He has a anion gap of 20. Chest  x-ray with a mild cardiomegaly, EKG shows a new left bundle branch block.  Tachycardia has improved with fluids. Patient will need admission for sepsis.  Case discussed with triad hospitalist Dr. Youlanda Roys sepsis admission to a step down bed. He would like for me to consult nephrology for further guidance on fluid resuscitation for this patient. Case discussed with nephrologist Dr. Juel Burrow who will evaluate the patient and make recommendations.  This is a shared visit with the attending physician who personally evaluated the patient and agrees with the care plan.            Wynetta Emery, PA-C 09/24/14 2103  Mancel Bale, MD 09/25/14 [phone]

## 2014-09-24 NOTE — Consult Note (Signed)
Reason for Consult:Acute Kidney Injury Referring Physician: Dr. Blaine Hamper  Chief Complaint: Leg pain  Assessment: 1. Acute Kidney Injury - By history and physical exam he appears to be volume down and is certainly not in acute heart failure at this moment but with the history of ischemic cardiomyopathy and an EF of 20% with bypass we will need to be careful with fluid administration. The AKI is likely from prerenal azotemia and should improve with isotonic fluid; hopefully he doesn't have ATN with his decreased renal reserve. His cardiologist recently doubled his Furosemide dose which may have contributed to the AKI especially in the setting of decreased PO intake. No history of obstructive uropathy or NSAID use or UTI's or stones. With his decreased mobile status in the past week we will also need to rule out rhabdomyolysis. 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 He is followed by a [facility] nephrology (Dr. Willene Hatchet?) and has been told he has CKD but does not know his baseline creatinine. He has not been counseled about dialysis and recently was told his function has improved. We will certainly need outside records to document the trend in renal function. Creatinine 02/2011 2.43 and 02/2014 5.8. He's been told that he has proteinuria in the past; if <525m then hypertensive nephrosclerosis and if macroproteinuria and greater than 1gm more suggestive of diabetic nephropathy.  3. Ischemic cardiomyopathy with a EF of 20% - appears to be stable and compensated at this time. 4. Diabetes - insulin dependent. 5. Hypertension. 6. PAD - continues to be an active smoker.  Plan 1.   Agree with renal ultrasound to rule out obstructive uropathy. It took a long time before finally getting a history that this is more difficulty voiding but more from being volume down than not being able to get a urinary stream started. Sometimes can see large kidneys with diabetic nephropathy but more likely will just  have thinning of the cortex and hyperechogenicity. I would be surprised to find hydronephrosis. 2.   Isotonic fluids at 772mhr for another liter and monitor volume status closely given history of ICM EF 20%. He still appears volume down even with the mild leg edema. 3.   Need to obtain outside records from Dr. AdWillene HatchetAlways possible that he has progressed to advanced CKD IV or V and may be closing in on dialysis. Hopefully there is a significant prerenal component and there will be some recovery of renal function. If the creatinine of 5 in 2015 is a point on the chronic progression of kidney disease then this may well be CKD V/ESRD. He has no absolute indications for initiation of dialysis. 4.   Hold diuretics at this time; restart at half dose sometime during this hospitalization.   HPI: [name] an [age].o. male with a history of CKD IV/V likely secondary to hypertension and diabetes who has known proteinuria and [facility] Ad[facility]ene Hat[facility]. He does not know his baseline creatinine and has not been notified that he may be closing in on dialysis. He denies any NSAID use, obstructive uropathy, dysuria, nephrolithiasis. He also denies nausea, vomiting, diarrhea, abdominal pain or dysgeusia. He recently had his Furosemide dose doubled by his cardiologist. He presents with worsening pain in the right leg, spreading erythema in the right leg as well, weakness in the legs (R>L) and swelling in the groin as well. He denies pain pain or sick contacts. He also denies fever, chills, cough, rigors, dyspnea or  chest pain. Over the past week that the right leg symptoms have appeared and worsened his PO intake has decreased bec of his poor mobility secondary to the leg weakness. He has only eaten in the morning when his relatives are present and can provide the meal.   Review of Systems  Constitutional: Negative for fever, chills and weight loss.  HENT: Negative for hearing loss and  tinnitus.   Eyes: Negative for blurred vision and photophobia.  Respiratory: Positive for cough. Negative for hemoptysis and sputum production.   Cardiovascular: Negative for chest pain, orthopnea and claudication.  Gastrointestinal: Negative for heartburn, nausea, vomiting, abdominal pain and diarrhea.  Genitourinary: Negative for dysuria, hematuria and flank pain.  Musculoskeletal: Positive for myalgias. Negative for back pain and neck pain.  Skin: Positive for rash.  Neurological: Positive for weakness. Negative for dizziness, tingling, tremors and headaches.  Endo/Heme/Allergies: Negative for polydipsia. Does not bruise/bleed easily.  Psychiatric/Behavioral: Negative for depression and suicidal ideas.   Pertinent items are noted in HPI.  Chemistry and CBC: CREATININE, SER  Date/Time Value Ref Range Status  09/24/2014 07:05 PM 7.95* 0.61 - 1.24 mg/dL Final  03/11/2014 03:18 PM 5.80* 0.50 - 1.35 mg/dL Final  03/23/2011 02:44 AM 2.43* 0.50 - 1.35 mg/dL Final  03/22/2011 05:55 AM 2.58* 0.50 - 1.35 mg/dL Final  03/21/2011 01:58 AM 2.81* 0.50 - 1.35 mg/dL Final  03/20/2011 02:30 AM 3.02* 0.50 - 1.35 mg/dL Final  03/19/2011 06:06 PM 3.12* 0.50 - 1.35 mg/dL Final  07/21/2008 03:55 AM 2.07* 0.4 - 1.5 mg/dL Final  07/20/2008 05:42 AM 2.17* 0.4 - 1.5 mg/dL Final  07/19/2008 03:38 AM 2.36* 0.4 - 1.5 mg/dL Final  07/18/2008 05:09 PM 2.5* 0.4 - 1.5 mg/dL Final  07/18/2008 05:00 PM 2.27* 0.4 - 1.5 mg/dL Final  07/18/2008 02:41 AM 1.89* 0.4 - 1.5 mg/dL Final  07/17/2008 07:38 PM 1.8* 0.4 - 1.5 mg/dL Final  07/17/2008 07:30 PM 1.63* 0.4 - 1.5 mg/dL Final  07/14/2008 10:14 AM 1.63* 0.4 - 1.5 mg/dL Final  05/08/2008 06:05 AM 1.72* 0.4 - 1.5 mg/dL Final  05/07/2008 06:05 AM 1.64* 0.4 - 1.5 mg/dL Final  05/06/2008 06:05 AM 1.76* 0.4 - 1.5 mg/dL Final  05/05/2008 05:00 AM 1.68* 0.4 - 1.5 mg/dL Final  05/04/2008 10:10 PM 1.56* 0.4 - 1.5 mg/dL Final  05/04/2008 11:55 AM 1.60* 0.4 - 1.5 mg/dL Final     Recent Labs Lab 09/24/14 1905  NA 136  K 3.8  CL 94*  CO2 22  GLUCOSE 129*  BUN 130*  CREATININE 7.95*  CALCIUM 9.0    Recent Labs Lab 09/24/14 1905  WBC 13.8*  NEUTROABS 13.0*  HGB 12.8*  HCT 38.0*  MCV 86.8  PLT 167   Liver Function Tests:  Recent Labs Lab 09/24/14 1905  AST 27  ALT 15*  ALKPHOS 168*  BILITOT 4.4*  PROT 7.0  ALBUMIN 3.0*   No results for input(s): LIPASE, AMYLASE in the last 168 hours. No results for input(s): AMMONIA in the last 168 hours. Cardiac Enzymes: No results for input(s): CKTOTAL, CKMB, CKMBINDEX, TROPONINI in the last 168 hours. Iron Studies: No results for input(s): IRON, TIBC, TRANSFERRIN, FERRITIN in the last 72 hours. PT/INR: _0 (inr:5)  Xrays/Other Studies: ) Results for orders placed or performed during the hospital encounter of 09/24/14 (from the past 48 hour(s))  CBC WITH DIFFERENTIAL     Status: Abnormal   Collection Time: 09/24/14  7:05 PM  Result Value Ref Range   WBC 13.8 (H) 4.0 -  10.5 K/uL   RBC 4.38 4.22 - 5.81 MIL/uL   Hemoglobin 12.8 (L) 13.0 - 17.0 g/dL   HCT 38.0 (L) 39.0 - 52.0 %   MCV 86.8 78.0 - 100.0 fL   MCH 29.2 26.0 - 34.0 pg   MCHC 33.7 30.0 - 36.0 g/dL   RDW 15.0 11.5 - 15.5 %   Platelets 167 150 - 400 K/uL   Neutrophils Relative % 95 (H) 43 - 77 %   Neutro Abs 13.0 (H) 1.7 - 7.7 K/uL   Lymphocytes Relative 4 (L) 12 - 46 %   Lymphs Abs 0.6 (L) 0.7 - 4.0 K/uL   Monocytes Relative 1 (L) 3 - 12 %   Monocytes Absolute 0.2 0.1 - 1.0 K/uL   Eosinophils Relative 0 0 - 5 %   Eosinophils Absolute 0.0 0.0 - 0.7 K/uL   Basophils Relative 0 0 - 1 %   Basophils Absolute 0.0 0.0 - 0.1 K/uL  Comprehensive metabolic panel     Status: Abnormal   Collection Time: 09/24/14  7:05 PM  Result Value Ref Range   Sodium 136 135 - 145 mmol/L   Potassium 3.8 3.5 - 5.1 mmol/L   Chloride 94 (L) 101 - 111 mmol/L   CO2 22 22 - 32 mmol/L   Glucose, Bld 129 (H) 65 - 99 mg/dL   BUN 130 (H) 6 - 20 mg/dL    Creatinine, Ser 7.95 (H) 0.61 - 1.24 mg/dL   Calcium 9.0 8.9 - 10.3 mg/dL   Total Protein 7.0 6.5 - 8.1 g/dL   Albumin 3.0 (L) 3.5 - 5.0 g/dL   AST 27 15 - 41 U/L   ALT 15 (L) 17 - 63 U/L   Alkaline Phosphatase 168 (H) 38 - 126 U/L   Total Bilirubin 4.4 (H) 0.3 - 1.2 mg/dL   GFR calc non Af Amer 7 (L) >60 mL/min   GFR calc Af Amer 8 (L) >60 mL/min    Comment: (NOTE) The eGFR has been calculated using the CKD EPI equation. This calculation has not been validated in all clinical situations. eGFR's persistently <60 mL/min signify possible Chronic Kidney Disease.    Anion gap 20 (H) 5 - 15  APTT     Status: Abnormal   Collection Time: 09/24/14  7:05 PM  Result Value Ref Range   aPTT 39 (H) 24 - 37 seconds    Comment:        IF BASELINE aPTT IS ELEVATED, SUGGEST PATIENT RISK ASSESSMENT BE USED TO DETERMINE APPROPRIATE ANTICOAGULANT THERAPY.   Protime-INR     Status: Abnormal   Collection Time: 09/24/14  7:05 PM  Result Value Ref Range   Prothrombin Time 17.1 (H) 11.6 - 15.2 seconds   INR 1.39 0.00 - 1.49  Urinalysis, Routine w reflex microscopic     Status: Abnormal   Collection Time: 09/24/14  7:23 PM  Result Value Ref Range   Color, Urine YELLOW YELLOW   APPearance CLOUDY (A) CLEAR   Specific Gravity, Urine 1.013 1.005 - 1.030   pH 5.0 5.0 - 8.0   Glucose, UA NEGATIVE NEGATIVE mg/dL   Hgb urine dipstick SMALL (A) NEGATIVE   Bilirubin Urine SMALL (A) NEGATIVE   Ketones, ur NEGATIVE NEGATIVE mg/dL   Protein, ur 100 (A) NEGATIVE mg/dL   Urobilinogen, UA 1.0 0.0 - 1.0 mg/dL   Nitrite NEGATIVE NEGATIVE   Leukocytes, UA SMALL (A) NEGATIVE  Urine rapid drug screen (hosp performed)not at [facility]     Status: Abnormal  Collection Time: 09/24/14  7:23 PM  Result Value Ref Range   Opiates POSITIVE (A) NONE DETECTED   Cocaine NONE DETECTED NONE DETECTED   Benzodiazepines NONE DETECTED NONE DETECTED   Amphetamines NONE DETECTED NONE DETECTED   Tetrahydrocannabinol NONE DETECTED  NONE DETECTED   Barbiturates NONE DETECTED NONE DETECTED    Comment:        DRUG SCREEN FOR MEDICAL PURPOSES ONLY.  IF CONFIRMATION IS NEEDED FOR ANY PURPOSE, NOTIFY LAB WITHIN 5 DAYS.        LOWEST DETECTABLE LIMITS FOR URINE DRUG SCREEN Drug Class       Cutoff (ng/mL) Amphetamine      1000 Barbiturate      200 Benzodiazepine   161 Tricyclics       096 Opiates          300 Cocaine          300 THC              50   Urine microscopic-add on     Status: None   Collection Time: 09/24/14  7:23 PM  Result Value Ref Range   WBC, UA 7-10 <3 WBC/hpf   RBC / HPF 0-2 <3 RBC/hpf   Bacteria, UA RARE RARE   Urine-Other AMORPHOUS URATES/PHOSPHATES   I-Stat CG4 Lactic Acid, ED  (not at [facility])     Status: None   Collection Time: 09/24/14  7:26 PM  Result Value Ref Range   Lactic Acid, Venous 1.85 0.5 - 2.0 mmol/L   Dg Chest Port 1 View  09/24/2014   CLINICAL DATA:  Weakness and lower extremity edema.  EXAM: PORTABLE CHEST - 1 VIEW  COMPARISON:  09/28/2013  FINDINGS: Stable mild cardiac enlargement and evidence of prior CABG. Lung volumes are low bilaterally. There is no evidence of pulmonary edema, consolidation, pneumothorax, nodule or pleural fluid.  IMPRESSION: Stable mild cardiomegaly.  Low lung volumes without acute findings.   Electronically Signed   By: Aletta Edouard M.D.   On: 09/24/2014 19:38    PMH:   Past Medical History  Diagnosis Date  . Coronary artery disease 07/05/2011    2D ECHO - EF ~40%, moderate concentric LV hypertrophy, LA moderately dilated, mild-moderate septal and inferior wall hypokinesis  . Hypertension   . CHF (congestive heart failure)   . Diabetes mellitus     Insulin dependent  . Cardiomyopathy   . Chronic kidney disease (CKD), stage IV (severe)   . Claudication 05/09/2011    Right and left anterior tibial arteries and Left SFA-occluded; Right CIA-<50% diameter reduction; Right Deep Profunda-70-99% diameter reduction; Right SFA->60% diameter reduction;  Right Distal Popliteal/Tibial Artery- >60% diameter reduction; Left CFA and Profunda- >50% diameter reduction  . 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 GERD (gastroesophageal reflux disease)     PSH:   Past Surgical History  Procedure Laterality Date  . Cardiac catheterization  05/07/2008    CABG  . Coronary artery bypass graft  06/2008    x3    Allergies: No Known Allergies  Medications:   Prior to Admission medications   Medication Sig Start Date End Date Taking? Authorizing Provider  calcium carbonate (TUMS EX) 750 MG chewable tablet Chew 1 tablet by mouth as needed for heartburn.   Yes Historical Provider, MD  Ferrous Sulfate (IRON)  325 (65 FE) MG TABS Take 325 mg by mouth daily.   Yes Historical Provider, MD  furosemide (LASIX) 80 MG tablet Take 1 tablet (80 mg total) by mouth daily. 02/10/14  Yes Mihai Croitoru, MD  hydrALAZINE (APRESOLINE) 50 MG tablet Take 1 tablet (50 mg total) by mouth every 8 (eight) hours. 11/07/13  Yes Mihai Croitoru, MD  isosorbide mononitrate (IMDUR) 60 MG 24 hr tablet Take 1 tablet (60 mg total) by mouth daily. 03/17/14  Yes Mihai Croitoru, MD  LEVEMIR 100 UNIT/ML injection Inject 5 Units into the skin daily before breakfast. 05/12/14  Yes Historical Provider, MD  metoprolol (LOPRESSOR) 50 MG tablet Take 1 tablet (50 mg total) by mouth 2 (two) times daily. 03/13/13  Yes Mihai Croitoru, MD  Oxycodone HCl 10 MG TABS Take 1 tablet by mouth every 6 (six) hours as needed. For pain 07/16/14  Yes Historical Provider, MD  pantoprazole (PROTONIX) 40 MG tablet Take 40 mg by mouth daily.  05/08/13  Yes Historical Provider, MD  sodium bicarbonate 650 MG tablet Take 650 mg by mouth 2 (two) times daily.   Yes Historical Provider, MD  acyclovir (ZOVIRAX) 800 MG tablet Take 1 tablet (800 mg  total) by mouth 5 (five) times daily. Patient not taking: Reported on 09/24/2014 03/11/14   Orpah Greek, MD  nitroGLYCERIN (NITROSTAT) 0.4 MG SL tablet Place 1 tablet (0.4 mg total) under the tongue once. Patient taking differently: Place 0.4 mg under the tongue every 5 (five) minutes as needed for chest pain.  10/01/13   Mihai Croitoru, MD    Discontinued Meds:  There are no discontinued medications.  Social History:  reports that he quit smoking about 12 months ago. His smoking use included Cigarettes. He has a 12.5 pack-year smoking history. He does not have any smokeless tobacco history on file. He reports that he drinks alcohol. He reports that he uses illicit drugs (Marijuana).  Family History:  No family history on file.  Blood pressure 132/78, pulse 120, temperature 97.8 F (36.6 C), temperature source Oral, resp. rate 17, height 5' 11" (1.803 m), weight 86.183 kg (190 lb), SpO2 100 %. General appearance: alert, cooperative and appears older than stated age Head: Normocephalic, without obvious abnormality, atraumatic Eyes: negative Nose: Nares normal. Septum midline. Mucosa normal. No drainage or sinus tenderness., no discharge Throat: Poor dentition Neck: no adenopathy, no carotid bruit, no JVD, supple, symmetrical, trachea midline and thyroid not enlarged, symmetric, no tenderness/mass/nodules Back: symmetric, no curvature. ROM normal. No CVA tenderness. Resp: clear to auscultation bilaterally Chest wall: no tenderness Cardio: irregularly irregular rhythm GI: soft, non-tender; bowel sounds normal; no masses,  no organomegaly Male genitalia: Erythematous scrotum Extremities: edema 1+ Pulses: 2+ and symmetric Skin: Erythema right leg extending to scrotum Neurologic: Grossly normal       LIN, Hunt Oris, MD 09/24/2014, 9:55 PM

## 2014-09-24 NOTE — ED Notes (Signed)
Message sent to pharmacy to send 750mg  Vancomycin

## 2014-09-24 NOTE — ED Notes (Signed)
Pt states for the last few days he has became progressively weak with bilaterally leg swelling worse on the right. Weeping edema is noted to both lower legs with blisters present. Pt has redness on right leg that radiates into right groin.

## 2014-09-24 NOTE — ED Provider Notes (Signed)
  Face-to-face evaluation   History: He complains of intermittent swelling, both legs, right greater than left, for 1 week. He has had some sores on his right foot. No recent medical care.  Physical exam: Alert, calm, cooperative. Skin is icteric. Abdomen soft, nontender, no palpable hepatomegaly. Right leg is markedly swollen with diffuse erythema more medial and multiple sores on the lower leg and foot. Left leg mildly swollen.  Medical screening examination/treatment/procedure(s) were conducted as a shared visit with non-physician practitioner(s) and myself.  I personally evaluated the patient during the encounter  Mancel BaleElliott Rondy Krupinski, MD 09/25/14 [phone]

## 2014-09-25 ENCOUNTER — Inpatient Hospital Stay (HOSPITAL_COMMUNITY): Payer: Medicare Other

## 2014-09-25 DIAGNOSIS — I10 Essential (primary) hypertension: Secondary | ICD-10-CM | POA: Diagnosis present

## 2014-09-25 DIAGNOSIS — N179 Acute kidney failure, unspecified: Secondary | ICD-10-CM | POA: Diagnosis present

## 2014-09-25 DIAGNOSIS — L03116 Cellulitis of left lower limb: Secondary | ICD-10-CM

## 2014-09-25 DIAGNOSIS — I5042 Chronic combined systolic (congestive) and diastolic (congestive) heart failure: Secondary | ICD-10-CM | POA: Diagnosis present

## 2014-09-25 DIAGNOSIS — I739 Peripheral vascular disease, unspecified: Secondary | ICD-10-CM | POA: Diagnosis present

## 2014-09-25 DIAGNOSIS — E785 Hyperlipidemia, unspecified: Secondary | ICD-10-CM | POA: Diagnosis present

## 2014-09-25 DIAGNOSIS — N189 Chronic kidney disease, unspecified: Secondary | ICD-10-CM

## 2014-09-25 DIAGNOSIS — R17 Unspecified jaundice: Secondary | ICD-10-CM

## 2014-09-25 DIAGNOSIS — I2510 Atherosclerotic heart disease of native coronary artery without angina pectoris: Secondary | ICD-10-CM | POA: Diagnosis present

## 2014-09-25 DIAGNOSIS — L03115 Cellulitis of right lower limb: Secondary | ICD-10-CM | POA: Diagnosis present

## 2014-09-25 DIAGNOSIS — E1165 Type 2 diabetes mellitus with hyperglycemia: Secondary | ICD-10-CM | POA: Diagnosis present

## 2014-09-25 DIAGNOSIS — IMO0001 Reserved for inherently not codable concepts without codable children: Secondary | ICD-10-CM | POA: Diagnosis present

## 2014-09-25 DIAGNOSIS — A419 Sepsis, unspecified organism: Principal | ICD-10-CM

## 2014-09-25 DIAGNOSIS — IMO0002 Reserved for concepts with insufficient information to code with codable children: Secondary | ICD-10-CM | POA: Diagnosis present

## 2014-09-25 LAB — LIPID PANEL
Cholesterol: 86 mg/dL (ref 0–200)
HDL: <10 mg/dL — ABNORMAL LOW (ref >40)
Triglycerides: 152 mg/dL — ABNORMAL HIGH (ref <150)
VLDL: 30 mg/dL (ref 0–40)

## 2014-09-25 LAB — LACTIC ACID, PLASMA
LACTIC ACID, VENOUS: 1.2 mmol/L (ref 0.5–2.0)
Lactic Acid, Venous: 1.6 mmol/L (ref 0.5–2.0)

## 2014-09-25 LAB — CREATININE, URINE, RANDOM: Creatinine, Urine: 63.38 mg/dL

## 2014-09-25 LAB — URINE CULTURE

## 2014-09-25 LAB — MRSA PCR SCREENING: MRSA by PCR: NEGATIVE

## 2014-09-25 LAB — COMPREHENSIVE METABOLIC PANEL
ALT: 13 U/L — AB (ref 17–63)
AST: 21 U/L (ref 15–41)
Albumin: 2.4 g/dL — ABNORMAL LOW (ref 3.5–5.0)
Alkaline Phosphatase: 126 U/L (ref 38–126)
Anion gap: 19 — ABNORMAL HIGH (ref 5–15)
BUN: 128 mg/dL — AB (ref 6–20)
CALCIUM: 8.2 mg/dL — AB (ref 8.9–10.3)
CHLORIDE: 97 mmol/L — AB (ref 101–111)
CO2: 19 mmol/L — ABNORMAL LOW (ref 22–32)
CREATININE: 7.61 mg/dL — AB (ref 0.61–1.24)
GFR calc Af Amer: 9 mL/min — ABNORMAL LOW (ref >60)
GFR, EST NON AFRICAN AMERICAN: 7 mL/min — AB (ref >60)
Glucose, Bld: 115 mg/dL — ABNORMAL HIGH (ref 65–99)
Potassium: 3.7 mmol/L (ref 3.5–5.1)
Sodium: 135 mmol/L (ref 135–145)
TOTAL PROTEIN: 5.9 g/dL — AB (ref 6.5–8.1)
Total Bilirubin: 3.5 mg/dL — ABNORMAL HIGH (ref 0.3–1.2)

## 2014-09-25 LAB — CBC
HCT: 31.9 % — ABNORMAL LOW (ref 39.0–52.0)
HEMOGLOBIN: 10.8 g/dL — AB (ref 13.0–17.0)
MCH: 29.6 pg (ref 26.0–34.0)
MCHC: 33.9 g/dL (ref 30.0–36.0)
MCV: 87.4 fL (ref 78.0–100.0)
Platelets: 132 10*3/uL — ABNORMAL LOW (ref 150–400)
RBC: 3.65 MIL/uL — ABNORMAL LOW (ref 4.22–5.81)
RDW: 14.9 % (ref 11.5–15.5)
WBC: 11 10*3/uL — ABNORMAL HIGH (ref 4.0–10.5)

## 2014-09-25 LAB — GLUCOSE, CAPILLARY: GLUCOSE-CAPILLARY: 96 mg/dL (ref 65–99)

## 2014-09-25 LAB — HIV ANTIBODY (ROUTINE TESTING W REFLEX): HIV Screen 4th Generation wRfx: NONREACTIVE

## 2014-09-25 LAB — BILIRUBIN, DIRECT: Bilirubin, Direct: 2.3 mg/dL — ABNORMAL HIGH (ref 0.1–0.5)

## 2014-09-25 LAB — BRAIN NATRIURETIC PEPTIDE: B Natriuretic Peptide: 2227 pg/mL — ABNORMAL HIGH (ref 0.0–100.0)

## 2014-09-25 LAB — PROCALCITONIN: PROCALCITONIN: 3.32 ng/mL

## 2014-09-25 MED ORDER — CALCIUM CARBONATE ANTACID 500 MG PO CHEW
2.0000 | CHEWABLE_TABLET | ORAL | Status: DC | PRN
  Filled 2014-09-25: qty 2

## 2014-09-25 MED ORDER — SODIUM BICARBONATE 8.4 % IV SOLN
50.0000 meq | Freq: Once | INTRAVENOUS | Status: AC
  Administered 2014-09-25: 50 meq via INTRAVENOUS
  Filled 2014-09-25 (×2): qty 50

## 2014-09-25 MED ORDER — FERROUS SULFATE 325 (65 FE) MG PO TABS
325.0000 mg | ORAL_TABLET | Freq: Every day | ORAL | Status: DC
  Administered 2014-09-25 – 2014-09-28 (×4): 325 mg via ORAL
  Filled 2014-09-25 (×7): qty 1

## 2014-09-25 MED ORDER — OXYCODONE HCL 5 MG PO TABS
10.0000 mg | ORAL_TABLET | Freq: Four times a day (QID) | ORAL | Status: DC | PRN
  Administered 2014-09-25 – 2014-09-27 (×2): 10 mg via ORAL
  Filled 2014-09-25 (×2): qty 2

## 2014-09-25 MED ORDER — INSULIN ASPART 100 UNIT/ML ~~LOC~~ SOLN
0.0000 [IU] | SUBCUTANEOUS | Status: DC
  Administered 2014-09-29 – 2014-10-02 (×11): 1 [IU] via SUBCUTANEOUS
  Administered 2014-10-02 – 2014-10-03 (×4): 2 [IU] via SUBCUTANEOUS
  Administered 2014-10-03: 3 [IU] via SUBCUTANEOUS
  Administered 2014-10-03: 1 [IU] via SUBCUTANEOUS
  Administered 2014-10-03: 2 [IU] via SUBCUTANEOUS
  Administered 2014-10-04: 3 [IU] via SUBCUTANEOUS
  Administered 2014-10-04 (×2): 2 [IU] via SUBCUTANEOUS
  Administered 2014-10-05: 1 [IU] via SUBCUTANEOUS
  Administered 2014-10-05 (×2): 2 [IU] via SUBCUTANEOUS
  Administered 2014-10-06 (×3): 1 [IU] via SUBCUTANEOUS
  Administered 2014-10-06: 2 [IU] via SUBCUTANEOUS
  Administered 2014-10-07 – 2014-10-08 (×7): 1 [IU] via SUBCUTANEOUS

## 2014-09-25 MED ORDER — SODIUM BICARBONATE 8.4 % IV SOLN
INTRAVENOUS | Status: AC
  Filled 2014-09-25: qty 50

## 2014-09-25 NOTE — ED Notes (Signed)
Called Ria in main lab to have urine samples added.

## 2014-09-25 NOTE — Progress Notes (Signed)
Initial Nutrition Assessment  DOCUMENTATION CODES:  Severe malnutrition in context of chronic illness  INTERVENTION:  Nepro BID, each provides 425 kcal and 19 g protein Prostat BID, each provides 100 kcal and 15 g protein  NUTRITION DIAGNOSIS:  Malnutrition related to chronic illness as evidenced by severe depletion of body fat, severe depletion of muscle mass.   GOAL:  Patient will meet greater than or equal to 90% of their needs   MONITOR:  Diet advancement, Labs, Weight trends, Skin, I & O's  REASON FOR ASSESSMENT:  NPO/Clear Liquid Diet    ASSESSMENT: Pt is a 49 y.o. 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  Pt received Ativan before the visit and was un-arousable to obtain any history, no family at bedside. Per RN, pt's mom disclosed that pt hasn't been eating much for the last couple of months. Wt history does not imply weight loss, however pt has +2 swelling in his lower extremities. Nutrition focused physical exam shows severe fat and muscle mass depletion in upper body. Currently pt is not on a diet pending procedure, will order Nepro and Prostat BID to help improve pt's nutritional status. Will continue to monitor. Labs reviewed: Glu 115 - 129, BUN 128, Cr 7.61, Ca 8.2   Height:  Ht Readings from Last 1 Encounters:  09/25/14 5\' 11"  (1.803 m)    Weight:  Wt Readings from Last 1 Encounters:  09/25/14 190 lb (86.183 kg)    Ideal Body Weight:  78 kg  Wt Readings from Last 10 Encounters:  09/25/14 190 lb (86.183 kg)  08/07/14 194 lb 11.2 oz (88.315 kg)  02/10/14 200 lb 9.6 oz (90.992 kg)  11/07/13 185 lb 9.6 oz (84.188 kg)  10/01/13 189 lb (85.73 kg)  05/09/13 196 lb 3.2 oz (88.996 kg)   11/19/12 201 lb 9.6 oz (91.445 kg)  03/23/11 202 lb 2.6 oz (91.7 kg)    BMI:  Body mass index is 26.51 kg/(m^2).  Estimated Nutritional Needs:  Kcal:  2100 - 2300  Protein:  100 - 115 g  Fluid:  per MD  Skin:  Wound (see comment) (serous blisters on legs)  Diet Order:  Diet NPO time specified  EDUCATION NEEDS:  Education needs no appropriate at this time   Intake/Output Summary (Last 24 hours) at 09/25/14 1531 Last data filed at 09/25/14 1444  Gross per 24 hour  Intake   2100 ml  Output   1025 ml  Net   1075 ml    Last BM:  PTA  Malarie Tappen A. Malin Cervini Dietetic Intern Pager: [phone] 09/25/2014 3:43 PM

## 2014-09-25 NOTE — Progress Notes (Signed)
Pt has has 3 4 beat runs of VTACH in the past 2 hours. Notified MD. Will continue to monitor.

## 2014-09-25 NOTE — Progress Notes (Signed)
Patient ID: [name], male   DOB: Oct 25, 1965, 49 y.o.   MRN: [id number]  [facility] Progress Note    Assessment/ Plan:   1.AKI on CKD IV/V: appears to be hemodynamically mediated with intravascular volume depletion following decreased oral intake and recent increase in diuresis- cannot rule out the possibility of ATN from sepsis. Attempting cautious hydration with some improvement of renal function overnight-will decrease rate of saline to 50 mL/h. Urine output fair. No evidence of obstruction on renal ultrasound. 2. History of congestive heart failure/ischemic cardiomyopathy: appears to be compensated at this time, rate of IV fluids decreased as a precaution to avoid decompensation of heart failure 3. Anemia: hemoglobin lower and likely reflective of dilution following intravenous fluids-monitor trend, check iron studies, he remains on oral ferrous sulfate 4. Hypertension: blood pressures currently fairly controlled on hydralazine, metoprolol and isosorbide 5. Cellulitis of right leg/sepsis: Status post intravenous fluids and now on empiric antibody therapy with vancomycin and Zosyn  Subjective:   Reports to be feeling well and complains of being thirsty-denies any chest pain or shortness of breath   Objective:   BP 130/79 mmHg  Pulse 113  Temp(Src) 98.3 F (36.8 C) (Oral)  Resp 19  Ht 5\' 11"  (1.803 m)  Wt 86.183 kg (190 lb)  BMI 26.51 kg/m2  SpO2 100%  Intake/Output Summary (Last 24 hours) at 09/25/14 0815 Last data filed at 09/25/14 [id number]  Gross per 24 hour  Intake   2100 ml  Output    725 ml  Net   1375 ml   Weight change:   Physical Exam: JXB:JYNWGNFAOZHGen:comfortably sitting up in recliner YQM:VHQIOCVS:pulse regular tachycardia, S1 and S2 normal Resp:decreased breath sounds over bases otherwise clear NGE:XBMWAbd:soft, obese, nontender UXL:KGMWNExt:right lower extremity erythema and multiple excoriations pretibially, left lower extremity erythema also noted  Imaging: [place]as Renal  09/25/2014    CLINICAL DATA:  Acute on chronic renal failure.  EXAM: RENAL / URINARY TRACT ULTRASOUND COMPLETE  COMPARISON:  Abdominal ultrasound 01/14/2014  FINDINGS: Right Kidney:  Length: 9.4 cm. Thinning of the renal parenchyma with increased cortical echogenicity. No mass or hydronephrosis visualized.  Left Kidney:  Length: 9.2 cm. Thinning of the renal parenchyma and increased renal echogenicity. No mass or hydronephrosis visualized.  Bladder:  Appears normal for degree of bladder distention.  Incidental: There is a moderate volume of ascites in the abdomen and pelvis.  IMPRESSION: 1. Small echogenic kidneys consistent chronic medical renal disease. No obstructive uropathy. 2. Moderate volume of intra-abdominal and pelvic ascites.   Electronically Signed   By: Rubye OaksMelanie  Ehinger M.D.   On: 09/25/2014 01:16   Dg Chest Port 1 View  09/24/2014   CLINICAL DATA:  Weakness and lower extremity edema.  EXAM: PORTABLE CHEST - 1 VIEW  COMPARISON:  09/28/2013  FINDINGS: Stable mild cardiac enlargement and evidence of prior CABG. Lung volumes are low bilaterally. There is no evidence of pulmonary edema, consolidation, pneumothorax, nodule or pleural fluid.  IMPRESSION: Stable mild cardiomegaly.  Low lung volumes without acute findings.   Electronically Signed   By: Irish LackGlenn  Yamagata M.D.   On: 09/24/2014 19:38    Labs: BMET  Recent Labs Lab 09/24/14 1905 09/25/14 0410  NA 136 135  K 3.8 3.7  CL 94* 97*  CO2 22 19*  GLUCOSE 129* 115*  BUN 130* 128*  CREATININE 7.95* 7.61*  CALCIUM 9.0 8.2*   CBC  Recent Labs Lab 09/24/14 1905 09/25/14 0410  WBC 13.8* 11.0*  NEUTROABS 13.0*  --  HGB 12.8* 10.8*  HCT 38.0* 31.9*  MCV 86.8 87.4  PLT 167 132*    Medications:    . ferrous sulfate  325 mg Oral Q breakfast  . folic acid  1 mg Oral Daily  . heparin  5,000 Units Subcutaneous 3 times per day  . insulin detemir  3 Units Subcutaneous QAC breakfast  . isosorbide mononitrate  60 mg Oral Daily  . LORazepam  0-4  mg Intravenous Q6H   Followed by  . [START ON 09/27/2014] LORazepam  0-4 mg Intravenous Q12H  . metoprolol  50 mg Oral BID  . multivitamin with minerals  1 tablet Oral Daily  . pantoprazole  40 mg Oral Daily  . piperacillin-tazobactam (ZOSYN)  IV  2.25 g Intravenous 3 times per day  . sodium bicarbonate  650 mg Oral BID  . sodium chloride  3 mL Intravenous Q12H  . thiamine  100 mg Oral Daily   Or  . thiamine  100 mg Intravenous Daily   Zetta Bills, MD 09/25/2014, 8:15 AM

## 2014-09-25 NOTE — ED Notes (Signed)
Patient NPO for fasting lipid panel scheduled at 0500.

## 2014-09-25 NOTE — Progress Notes (Signed)
[facility] TEAM 1 - Stepdown/ICU TEAM Progress Note  Felecia JanFrank Largo WUJ:[id number] DOB: 11/17/1965 DOA: 09/24/2014 PCP: Dema SeverinYORK,REGINA F, NP  Admit HPI / Brief Narrative: 49 y.o. WM PMHx hypertension, hyperlipidemia, diabetes mellitus, GERD, CAD(s/P of CABG 2010), combined systolic and diastolic congestive heart failure (EF 20-25% with grade 3 diastolic dysfunction), chronic kidney disease-stage IV, severe PAD, substance abuse, who presents with right leg swelling and pain.  Patient is a poor historian. It seems that he has been having right leg pain and swelling in the past several days. His leg becomes red from foot to upper thigh medially. He does not have fever or chills. Patient was unable to ambulate over the past 2 days secondary to pain on the right leg. He reports a generalized weakness. States he has decreased urinary output and constipation, states he has not been eating and drinking regularly. Patient states that he is living with his mother.  In ED, patient was found to have WBC 13.8, tachycardia, normal temperature, stable blood pressure, AoCKD-IV, negative chest x-ray for acute abnormalities, ALP 168, AST 27, ALT 15, total bilirubin 4.4, lactate 1.85, INR 1.39, urinalysis with small amount of leukocytes. Patient is admitted to inpatient for further evaluation and treatment. Renal was consulted by ED  HPI/Subjective: 6/2 A/O 0 patient somnolent and unarousable (note patient received 2 mg Ativan)  Assessment/Plan: Bilateral lower leg Cellulitis  and sepsis due to unspecified organism:  -Meets sepsis criteria with leukocytosis and tachycardia  -continue empiric anabiotic  - Continue normal saline 75 ml/hr; watch for fluid overload (patient's EF 20 to 25% and AoCKD-IV, limiting aggressive IVF) - Blood cultures x 2 pending - Lactic acid has normalized, watch closely  Jaundice (multiple tattoos which appear to be homemade?) -Obtain acute hepatitis panel -Obtain RPR -Obtain anti-nuclear  antibody -HIV negative  PAD: - Patient had arterial Doppler 2013, which showed Right and left anterior tibial arteries and Left SFA-occluded; Right CIA-<50% diameter reduction; Right Deep Profunda-70-99% diameter reduction; Right SFA->60% diameter reduction; Right Distal Popliteal/Tibial Artery- >60% diameter reduction; Left CFA and Profunda- >50% diameter reduction. -may need to consult to vascular surgeon or follow up with them.  CAD in native artery and s/p of CABG 2010:  -Obtain echocardiogram  - EKG showed old left bundle blockage. -continue Imdur 60 mg daily  -Metoprol 50 mg BID  GERD: -Protonix  HTN: -See CAD  Hyperlipidemia:  -Obtain  DM-II uncontrolled:  -Last A1c 10.5 on 03/19/11.,  -Obtain hemoglobin A1c -ContinueLevemir 3 units  --Sensitive SSI  Acute on chronic renal failure( Previous creatinine was a 5.85 on 03/11/14) -Be very judicious in the use of narcotics and benzodiazepine was as patient will not clearly impaired. -Current Cr= 7.61 -Sodium bicarbonate 1 Amp (50 mEq), patient currently unable to take PO -Follow-up renal Recs -IVF as above -Check FeUrea -US-renal -Follow up renal function by BMP -Avoid ACEI and NSAIDs -Hold Diuretics now  Combined systolic and diastolic congestive heart failure:  -2-D echo on 02/21/14 showed EF 20-25% with grade 3 diastolic dysfunction.  -Obtain echocardiogram  - seen by Dr. Royann Shiversroitoru on 08/07/14. Per Dr. Royann Shiversroitoru, the decisions regarding cardiac device therapy is depending heavily on his decision regarding dialysis. Subcutaneous ICD was considered if he starts dialysis. - Hold  Lasix  -Check BNP - After echocardiogram obtained follow up with card   Code Status: FULL Family Communication: no family present at time of exam Disposition Plan: Resolution sepsis    Consultants: Dr.James Kerry KassW Lin (nephrology)  Procedure/Significant Events:  Culture   Antibiotics: Vancomycin 6 /1>> Zosyn 6/2>>  DVT  prophylaxis: Subcutaneous heparin   Devices    LINES / TUBES:      Continuous Infusions: . sodium chloride 50 mL/hr at 09/25/14 0911    Objective: VITAL SIGNS: Temp: 98.3 F (36.8 C) (06/02 1600) Temp Source: Oral (06/02 1600) BP: 147/85 mmHg (06/02 1604) Pulse Rate: 100 (06/02 1000) SPO2; FIO2:   Intake/Output Summary (Last 24 hours) at 09/25/14 2140 Last data filed at 09/25/14 1655  Gross per 24 hour  Intake     50 ml  Output    975 ml  Net   -925 ml     Exam: General: A/O 0, jaundiced, cachectic, No acute respiratory distress Eyes: retinal hemorrhage, sluggish reaction pupils to light. ENT: Negative Runny nose, Neck:  Negative scars, masses, torticollis, lymphadenopathy, JVD Lungs: Clear to auscultation bilaterally without wheezes or crackles Cardiovascular: Tachycardic, Regular Rhythm without murmur gallop or rub normal S1 and S2 Abdomen:negative abdominal pain, negative dysphagia, nondistended, soft, bowel sounds positive, no rebound, no ascites, no appreciable mass Extremities: Bilateral lower extremity erythema on the right from waist down, on left knee down, on left eye petechia Psychiatric:  Unable to assess  Neurologic:  Unable to assess    Data Reviewed: Basic Metabolic Panel:  Recent Labs Lab 09/24/14 1905 09/25/14 0410  NA 136 135  K 3.8 3.7  CL 94* 97*  CO2 22 19*  GLUCOSE 129* 115*  BUN 130* 128*  CREATININE 7.95* 7.61*  CALCIUM 9.0 8.2*   Liver Function Tests:  Recent Labs Lab 09/24/14 1905 09/25/14 0410  AST 27 21  ALT 15* 13*  ALKPHOS 168* 126  BILITOT 4.4* 3.5*  PROT 7.0 5.9*  ALBUMIN 3.0* 2.4*   No results for input(s): LIPASE, AMYLASE in the last 168 hours. No results for input(s): AMMONIA in the last 168 hours. CBC:  Recent Labs Lab 09/24/14 1905 09/25/14 0410  WBC 13.8* 11.0*  NEUTROABS 13.0*  --   HGB 12.8* 10.8*  HCT 38.0* 31.9*  MCV 86.8 87.4  PLT 167 132*   Cardiac Enzymes: No results for  input(s): CKTOTAL, CKMB, CKMBINDEX, TROPONINI in the last 168 hours. BNP (last 3 results)  Recent Labs  09/25/14 0410  BNP 2227.0*    ProBNP (last 3 results) No results for input(s): PROBNP in the last 8760 hours.  CBG:  Recent Labs Lab 09/25/14 0838  GLUCAP 96    Recent Results (from the past 240 hour(s))  MRSA PCR Screening     Status: None   Collection Time: 09/25/14  7:00 AM  Result Value Ref Range Status   MRSA by PCR NEGATIVE NEGATIVE Final    Comment:        The GeneXpert MRSA Assay (FDA approved for NASAL specimens only), is one component of a comprehensive MRSA colonization surveillance program. It is not intended to diagnose MRSA infection nor to guide or monitor treatment for MRSA infections.      Studies:  Recent x-ray studies have been reviewed in detail by the Attending Physician  Scheduled Meds:  Scheduled Meds: . ferrous sulfate  325 mg Oral Q breakfast  . folic acid  1 mg Oral Daily  . heparin  5,000 Units Subcutaneous 3 times per day  . insulin aspart  0-9 Units Subcutaneous 6 times per day  . insulin detemir  3 Units Subcutaneous QAC breakfast  . isosorbide mononitrate  60 mg Oral Daily  . LORazepam  0-4 mg Intravenous Q6H  Followed by  . [START ON 09/27/2014] LORazepam  0-4 mg Intravenous Q12H  . metoprolol  50 mg Oral BID  . multivitamin with minerals  1 tablet Oral Daily  . pantoprazole  40 mg Oral Daily  . piperacillin-tazobactam (ZOSYN)  IV  2.25 g Intravenous 3 times per day  . sodium bicarbonate  50 mEq Intravenous Once  . sodium bicarbonate  650 mg Oral BID  . sodium chloride  3 mL Intravenous Q12H  . thiamine  100 mg Oral Daily   Or  . thiamine  100 mg Intravenous Daily    Time spent on care of this patient: 40 mins   Jadriel Saxer, Roselind Messier , MD  [facility] Office  [phone] Pager - [phone]  On-Call/Text Page:      [email or web address]      password [email or web address]  If 7PM-7AM, please contact  night-coverage [email or web address] Password [email or web address] 09/25/2014, 9:40 PM   LOS: 1 day   Care during the described time interval was provided by me .  I have reviewed this patient's available data, including medical history, events of note, physical examination, and all test results as part of my evaluation. I have personally reviewed and interpreted all radiology studies.   Carolyne Littles, MD [phone] Pager

## 2014-09-25 NOTE — Evaluation (Signed)
Physical Therapy Evaluation Patient Details Name: [name] MRN: [id number] DOB: Sep 03, 1965 Today's Date: 09/25/2014   History of Present Illness  Pt admitted with RLE cellulitis. PMHx of HTN, HLD, DM, GERD, CHF, CKD, polysubstance abuse.   Clinical Impression  Pt willing to mobilize and reports pain in RLE with dependent positioning but limited with all mobility due to pain in leg. Pt with decreased orientation, safety awareness and mobility who will benefit from acute therapy to maximize strength, balance, mobility and function to decrease burden of care. Recommend OOB daily with nursing assist with use of RW. Pt educated for bil LE ROM and encouraged to perform.    Follow Up Recommendations SNF;Supervision/Assistance - 24 hour    Equipment Recommendations  None recommended by PT    Recommendations for Other Services OT consult     Precautions / Restrictions Precautions Precautions: Fall Restrictions Weight Bearing Restrictions: No      Mobility  Bed Mobility Overal bed mobility: Modified Independent             General bed mobility comments: increased time with use of rail   Transfers Overall transfer level: Needs assistance   Transfers: Sit to/from Stand Sit to Stand: Min guard         General transfer comment: cues for sequence, hand placement and safety with weight primarily on LLE to stand  Ambulation/Gait Ambulation/Gait assistance: Min assist Ambulation Distance (Feet): 3 Feet Assistive device: Rolling walker (2 wheeled) Gait Pattern/deviations: Step-to pattern;Decreased stride length;Decreased stance time - right;Trunk flexed   Gait velocity interpretation: Below normal speed for age/gender General Gait Details: cues for sequence to unweight RLE, limited by pain with minimal steps and pivoting to chair  Stairs            Wheelchair Mobility    Modified Rankin (Stroke Patients Only)       Balance Overall balance assessment: Needs  assistance   Sitting balance-Leahy Scale: Fair       Standing balance-Leahy Scale: Poor                               Pertinent Vitals/Pain Pain Assessment: 0-10 Pain Score: 6  Pain Location: RLE in dependent position or standing Pain Descriptors / Indicators: Aching Pain Intervention(s): Repositioned;Limited activity within patient's tolerance  HR 98-120 sats 97% RA    Home Living Family/patient expects to be discharged to:: Private residence Living Arrangements: Other relatives (chart states pt lives with mom but he says he lives with sister) Available Help at Discharge: Family;Available PRN/intermittently Type of Home: House Home Access: Stairs to enter   Entrance Stairs-Number of Steps: 6 Home Layout: One level Home Equipment: Walker - 2 wheels;Walker - standard;Wheelchair - manual      Prior Function Level of Independence: Independent               Hand Dominance        Extremity/Trunk Assessment   Upper Extremity Assessment: Generalized weakness           Lower Extremity Assessment: RLE deficits/detail RLE Deficits / Details: unable to fullly extend knee due to pain, unable to perform resistive assessment due to pain    Cervical / Trunk Assessment: Normal  Communication   Communication: No difficulties  Cognition Arousal/Alertness: Awake/alert Behavior During Therapy: WFL for tasks assessed/performed Overall Cognitive Status: Impaired/Different from baseline Area of Impairment: Orientation;Memory;Safety/judgement Orientation Level: Disoriented to;Time   Memory: Decreased short-term memory  General Comments: Pt unable to recall when leg pain started, did not know month or day, easily drifts to sleep when static    General Comments      Exercises        Assessment/Plan    PT Assessment Patient needs continued PT services  PT Diagnosis Difficulty walking;Acute pain   PT Problem List Decreased  strength;Decreased range of motion;Decreased activity tolerance;Decreased balance;Pain;Decreased mobility;Decreased knowledge of use of DME  PT Treatment Interventions Gait training;DME instruction;Functional mobility training;Stair training;Therapeutic activities;Therapeutic exercise;Patient/family education;Balance training;Cognitive remediation   PT Goals (Current goals can be found in the Care Plan section) Acute Rehab PT Goals Patient Stated Goal: be able to walk and go home PT Goal Formulation: With patient Time For Goal Achievement: 10/09/14 Potential to Achieve Goals: Fair    Frequency Min 3X/week   Barriers to discharge Decreased caregiver support sister works and no 24 hr assist    Co-evaluation               End of Session Equipment Utilized During Treatment: Gait belt Activity Tolerance: Patient tolerated treatment well Patient left: in chair;with call bell/phone within reach;with chair alarm set Nurse Communication: Mobility status         Time: [id number] PT Time Calculation (min) (ACUTE ONLY): 21 min   Charges:   PT Evaluation $Initial PT Evaluation Tier I: 1 Procedure     PT G CodesDelorse Lek:        Tabor, Kalese Ensz Beth 09/25/2014, 9:28 AM Toney SangMaija Tabor Oneil Behney, PT [phone]

## 2014-09-25 NOTE — ED Notes (Signed)
Called main lab, spoke to SimontonRia. No lactic acid collected or received. Relayed to phlebotomy to collect.

## 2014-09-25 NOTE — Evaluation (Signed)
Occupational Therapy Evaluation Patient Details Name: [name] MRN: [id number] DOB: May 20, 1965 Today's Date: 09/25/2014    History of Present Illness Pt admitted with RLE cellulitis. PMHx of HTN, HLD, DM, GERD, CHF, CKD, polysubstance abuse.    Clinical Impression   Pt is typically independent, was using a walker his mother got for him in the days leading up to admission.  Pt presents with generalized weakness, R LE pain, impaired balance and cognition interfering with ability to perform ADL and ADL transfers.  Recommending SNF upon discharge, but pt not likely to be agreeable per mom. Will follow acutely.    Follow Up Recommendations  SNF;Supervision/Assistance - 24 hour    Equipment Recommendations       Recommendations for Other Services       Precautions / Restrictions Precautions Precautions: Fall Precaution Comments: watch HR Restrictions Weight Bearing Restrictions: No      Mobility Bed Mobility Overal bed mobility: Modified Independent             General bed mobility comments: pt up in chair  Transfers Overall transfer level: Needs assistance Equipment used: Rolling walker (2 wheeled) Transfers: Sit to/from Stand Sit to Stand: Min assist         General transfer comment: assist to rise from chair    Balance Overall balance assessment: Needs assistance Sitting-balance support: Feet supported Sitting balance-Leahy Scale: Fair       Standing balance-Leahy Scale: Poor Standing balance comment: heavily reliant on UEs                            ADL Overall ADL's : Needs assistance/impaired     Grooming: Wash/dry hands;Wash/dry face;Set up;Sitting   Upper Body Bathing: Moderate assistance;Sitting   Lower Body Bathing: Maximal assistance;Sit to/from stand   Upper Body Dressing : Minimal assistance;Sitting   Lower Body Dressing: Maximal assistance;Sit to/from Market researcherstand     Toilet Transfer Details (indicate cue type and reason):  stood to use urinal, reliant on B UEs for standing while OT held urinal Toileting- Clothing Manipulation and Hygiene: Maximal assistance               Vision     Perception     Praxis      Pertinent Vitals/Pain Pain Assessment: Faces Pain Score: 6  Faces Pain Scale: Hurts even more Pain Location: R LE Pain Descriptors / Indicators: Grimacing;Guarding Pain Intervention(s): Limited activity within patient's tolerance;Monitored during session;Repositioned     Hand Dominance Right   Extremity/Trunk Assessment Upper Extremity Assessment Upper Extremity Assessment: Generalized weakness   Lower Extremity Assessment Lower Extremity Assessment: Defer to PT evaluation RLE Deficits / Details: unable to fullly extend knee due to pain, unable to perform resistive assessment due to pain   Cervical / Trunk Assessment Cervical / Trunk Assessment: Normal   Communication Communication Communication: No difficulties   Cognition Arousal/Alertness: Awake/alert Behavior During Therapy: WFL for tasks assessed/performed Overall Cognitive Status: Impaired/Different from baseline Area of Impairment: Orientation;Memory;Safety/judgement Orientation Level: Disoriented to;Time   Memory: Decreased short-term memory   Safety/Judgement: Decreased awareness of safety;Decreased awareness of deficits     General Comments: Pt unable to recall when leg pain started, did not know month or day, easily drifts to sleep when static   General Comments       Exercises       Shoulder Instructions      Home Living Family/patient expects to be discharged to:: Private residence Living Arrangements:  Other relatives;Parent (has been with mom and sister x 1 month) Available Help at Discharge: Family;Available PRN/intermittently Type of Home: House Home Access: Stairs to enter [employer] of Steps: 6   Home Layout: One level     Bathroom Shower/Tub: Chief Strategy Officer:  Standard     Home Equipment: Environmental consultant - 2 wheels;Wheelchair - manual;Cane - single point   Additional Comments: mom reports pt using a walker prior to this admission as he became weaker, moved in with family to start peritoneal dialysis, but was not started because of dogs in the house per mom      Prior Functioning/Environment Level of Independence: Independent             OT Diagnosis: Generalized weakness;Cognitive deficits;Acute pain   OT Problem List: Decreased strength;Decreased activity tolerance;Impaired balance (sitting and/or standing);Decreased knowledge of use of DME or AE;Decreased safety awareness;Decreased cognition;Decreased coordination;Cardiopulmonary status limiting activity;Pain;Increased edema   OT Treatment/Interventions: Self-care/ADL training;DME and/or AE instruction;Therapeutic activities;Balance training    OT Goals(Current goals can be found in the care plan section) Acute Rehab OT Goals Patient Stated Goal: be able to walk and go home OT Goal Formulation: With patient Time For Goal Achievement: 10/09/14 Potential to Achieve Goals: Good ADL Goals Pt Will Perform Grooming: with min guard assist;standing Pt Will Perform Upper Body Bathing: with supervision;sitting Pt Will Perform Lower Body Bathing: with min assist;sit to/from stand Pt Will Perform Upper Body Dressing: with supervision;sitting Pt Will Perform Lower Body Dressing: with min assist;sit to/from stand Pt Will Transfer to Toilet: with min guard assist;ambulating;bedside commode (over toilet) Pt Will Perform Toileting - Clothing Manipulation and hygiene: with min guard assist;sit to/from stand  OT Frequency: Min 2X/week   Barriers to D/C:            Co-evaluation              End of Session Equipment Utilized During Treatment: Engineer, water Communication:  (urinated )  Activity Tolerance: Treatment limited secondary to medical complications (Comment) (HR to 124 with 3  beats of vtach with standing to urinate) Patient left: in chair;with call bell/phone within reach;with chair alarm set   Time: 1610-9604 OT Time Calculation (min): 27 min Charges:  OT General Charges $OT Visit: 1 Procedure OT Evaluation $Initial OT Evaluation Tier I: 1 Procedure OT Treatments $Self Care/Home Management : 8-22 mins G-Codes:    Evern Bio 09/25/2014, 10:54 AM  [phone]

## 2014-09-25 NOTE — Care Management Note (Addendum)
Case Management Note  Patient Details  Name: [name] MRN: [id number] Date of Birth: 01-10-1966  Subjective/Objective:    Adm w cellulitis leg                Action/Plan: lives w sister, pcp dr Mauricio Pore[place]   Expected Discharge Date:                  Expected Discharge Plan:  Home w hospice care, [facility]  In-House Referral:     Discharge planning Services     Post Acute Care Choice:    Choice offered to:     DME Arranged:    DME Agency:     HH Arranged:    HH Agency:     Status of Service:     Medicare Important Message Given:    Date Medicare IM Given:    Medicare IM give by:    Date Additional Medicare IM Given:    Additional Medicare Important Message give by:     If discussed at Long Length of Stay Meetings, dates discussed:    Additional Comments: ur review done  Hanley HaysDowell, Lanessa Shill T, RN 09/25/2014, 10:15 AM

## 2014-09-26 ENCOUNTER — Ambulatory Visit: Payer: Medicare Other | Admitting: Cardiovascular Disease

## 2014-09-26 ENCOUNTER — Inpatient Hospital Stay (HOSPITAL_COMMUNITY): Payer: Medicare Other

## 2014-09-26 DIAGNOSIS — I429 Cardiomyopathy, unspecified: Secondary | ICD-10-CM | POA: Diagnosis present

## 2014-09-26 DIAGNOSIS — I509 Heart failure, unspecified: Secondary | ICD-10-CM

## 2014-09-26 DIAGNOSIS — G9341 Metabolic encephalopathy: Secondary | ICD-10-CM | POA: Diagnosis present

## 2014-09-26 DIAGNOSIS — L03115 Cellulitis of right lower limb: Secondary | ICD-10-CM

## 2014-09-26 DIAGNOSIS — I5022 Chronic systolic (congestive) heart failure: Secondary | ICD-10-CM | POA: Diagnosis present

## 2014-09-26 DIAGNOSIS — L03116 Cellulitis of left lower limb: Secondary | ICD-10-CM | POA: Diagnosis present

## 2014-09-26 DIAGNOSIS — E876 Hypokalemia: Secondary | ICD-10-CM

## 2014-09-26 LAB — CBC WITH DIFFERENTIAL/PLATELET
BASOS ABS: 0 10*3/uL (ref 0.0–0.1)
Basophils Relative: 0 % (ref 0–1)
EOS ABS: 0 10*3/uL (ref 0.0–0.7)
EOS PCT: 0 % (ref 0–5)
HEMATOCRIT: 34.2 % — AB (ref 39.0–52.0)
HEMOGLOBIN: 11.5 g/dL — AB (ref 13.0–17.0)
Lymphocytes Relative: 5 % — ABNORMAL LOW (ref 12–46)
Lymphs Abs: 0.6 10*3/uL — ABNORMAL LOW (ref 0.7–4.0)
MCH: 29.3 pg (ref 26.0–34.0)
MCHC: 33.6 g/dL (ref 30.0–36.0)
MCV: 87.2 fL (ref 78.0–100.0)
Monocytes Absolute: 0.2 10*3/uL (ref 0.1–1.0)
Monocytes Relative: 2 % — ABNORMAL LOW (ref 3–12)
NEUTROS ABS: 11.6 10*3/uL — AB (ref 1.7–7.7)
NEUTROS PCT: 93 % — AB (ref 43–77)
Platelets: 145 10*3/uL — ABNORMAL LOW (ref 150–400)
RBC: 3.92 MIL/uL — ABNORMAL LOW (ref 4.22–5.81)
RDW: 15 % (ref 11.5–15.5)
WBC: 12.4 10*3/uL — AB (ref 4.0–10.5)

## 2014-09-26 LAB — COMPREHENSIVE METABOLIC PANEL
ALT: 13 U/L — AB (ref 17–63)
AST: 18 U/L (ref 15–41)
Albumin: 2.4 g/dL — ABNORMAL LOW (ref 3.5–5.0)
Alkaline Phosphatase: 113 U/L (ref 38–126)
Anion gap: 22 — ABNORMAL HIGH (ref 5–15)
BUN: 130 mg/dL — AB (ref 6–20)
CHLORIDE: 97 mmol/L — AB (ref 101–111)
CO2: 19 mmol/L — ABNORMAL LOW (ref 22–32)
Calcium: 8.3 mg/dL — ABNORMAL LOW (ref 8.9–10.3)
Creatinine, Ser: 7.35 mg/dL — ABNORMAL HIGH (ref 0.61–1.24)
GFR calc non Af Amer: 8 mL/min — ABNORMAL LOW (ref >60)
GFR, EST AFRICAN AMERICAN: 9 mL/min — AB (ref >60)
Glucose, Bld: 75 mg/dL (ref 65–99)
Potassium: 3.3 mmol/L — ABNORMAL LOW (ref 3.5–5.1)
Sodium: 138 mmol/L (ref 135–145)
Total Bilirubin: 3.5 mg/dL — ABNORMAL HIGH (ref 0.3–1.2)
Total Protein: 6.2 g/dL — ABNORMAL LOW (ref 6.5–8.1)

## 2014-09-26 LAB — LIPID PANEL
Cholesterol: 89 mg/dL (ref 0–200)
TRIGLYCERIDES: 137 mg/dL (ref <150)
VLDL: 27 mg/dL (ref 0–40)

## 2014-09-26 LAB — GLUCOSE, CAPILLARY
GLUCOSE-CAPILLARY: 109 mg/dL — AB (ref 65–99)
GLUCOSE-CAPILLARY: 76 mg/dL (ref 65–99)
Glucose-Capillary: 71 mg/dL (ref 65–99)
Glucose-Capillary: 107 mg/dL — ABNORMAL HIGH (ref 65–99)
Glucose-Capillary: 68 mg/dL (ref 65–99)
Glucose-Capillary: 69 mg/dL (ref 65–99)
Glucose-Capillary: 110 mg/dL — ABNORMAL HIGH (ref 65–99)
Glucose-Capillary: 84 mg/dL (ref 65–99)

## 2014-09-26 LAB — MAGNESIUM: Magnesium: 2.4 mg/dL (ref 1.7–2.4)

## 2014-09-26 LAB — HEMOGLOBIN A1C
Hgb A1c MFr Bld: 6.2 % — ABNORMAL HIGH (ref 4.8–5.6)
MEAN PLASMA GLUCOSE: 131 mg/dL

## 2014-09-26 LAB — UREA NITROGEN, URINE: Urea Nitrogen, Ur: 495 mg/dL

## 2014-09-26 LAB — HIV ANTIBODY (ROUTINE TESTING W REFLEX): HIV Screen 4th Generation wRfx: NONREACTIVE

## 2014-09-26 LAB — LIPASE, BLOOD: Lipase: 106 U/L — ABNORMAL HIGH (ref 22–51)

## 2014-09-26 LAB — RPR: RPR Ser Ql: NONREACTIVE

## 2014-09-26 MED ORDER — DEXTROSE 50 % IV SOLN
INTRAVENOUS | Status: AC
  Administered 2014-09-26: 25 mL
  Filled 2014-09-26: qty 50

## 2014-09-26 MED ORDER — LORAZEPAM 0.5 MG PO TABS: 0.5000 mg | ORAL_TABLET | Freq: Four times a day (QID) | ORAL | Status: AC | PRN

## 2014-09-26 MED ORDER — SODIUM BICARBONATE 650 MG PO TABS
1300.0000 mg | ORAL_TABLET | Freq: Two times a day (BID) | ORAL | Status: DC
  Administered 2014-09-26 – 2014-09-28 (×4): 1300 mg via ORAL
  Filled 2014-09-26 (×9): qty 2

## 2014-09-26 MED ORDER — DEXTROSE 50 % IV SOLN
25.0000 mL | Freq: Once | INTRAVENOUS | Status: AC
Start: 2014-09-26 — End: 2014-09-26
  Administered 2014-09-26: 25 mL via INTRAVENOUS
  Filled 2014-09-26: qty 50

## 2014-09-26 MED ORDER — POTASSIUM CHLORIDE 10 MEQ/100ML IV SOLN
10.0000 meq | INTRAVENOUS | Status: AC
  Administered 2014-09-26 (×3): 10 meq via INTRAVENOUS
  Filled 2014-09-26 (×3): qty 100

## 2014-09-26 MED ORDER — VANCOMYCIN HCL IN DEXTROSE 1-5 GM/200ML-% IV SOLN
1000.0000 mg | INTRAVENOUS | Status: DC
  Administered 2014-09-26 – 2014-09-29 (×2): 1000 mg via INTRAVENOUS
  Filled 2014-09-26 (×2): qty 200

## 2014-09-26 MED ORDER — DEXTROSE 50 % IV SOLN
INTRAVENOUS | Status: AC
  Filled 2014-09-26: qty 50

## 2014-09-26 MED ORDER — LORAZEPAM 2 MG/ML IJ SOLN
0.5000 mg | Freq: Four times a day (QID) | INTRAMUSCULAR | Status: AC | PRN
  Administered 2014-09-26: 0.5 mg via INTRAVENOUS
  Filled 2014-09-26 (×2): qty 1

## 2014-09-26 NOTE — Progress Notes (Signed)
ANTIBIOTIC CONSULT NOTE - FOLLOW UP  Pharmacy Consult for zosyn/vancomycin Indication: cellulitis/sepsis  No Known Allergies  Patient Measurements: Height: 5\' 11"  (180.3 cm) (family reports at the bedside. ) Weight: 185 lb 13.6 oz (84.3 kg) IBW/kg (Calculated) : 75.3  Vital Signs: Temp: 97.7 F (36.5 C) (06/03 0819) Temp Source: Axillary (06/03 0819) BP: 168/91 mmHg (06/03 0819) Pulse Rate: 114 (06/03 0819) Intake/Output from previous day: 06/02 0701 - 06/03 0700 In: 448 [I.V.:348; IV Piggyback:100] Out: 1550 [Urine:1550] Intake/Output from this shift:    Labs:  Recent Labs  09/24/14 1905 09/24/14 1929 09/25/14 0410 09/26/14 0345  WBC 13.8*  --  11.0* 12.4*  HGB 12.8*  --  10.8* 11.5*  PLT 167  --  132* 145*  LABCREA  --  63.38  --   --   CREATININE 7.95*  --  7.61* 7.35*   Estimated Creatinine Clearance: 12.8 mL/min (by C-G formula based on Cr of 7.35). No results for input(s): VANCOTROUGH, VANCOPEAK, VANCORANDOM, GENTTROUGH, GENTPEAK, GENTRANDOM, TOBRATROUGH, TOBRAPEAK, TOBRARND, AMIKACINPEAK, AMIKACINTROU, AMIKACIN in the last 72 hours.   Microbiology: Recent Results (from the past 720 hour(s))  Blood Culture (routine x 2)     Status: None (Preliminary result)   Collection Time: 09/24/14  7:17 PM  Result Value Ref Range Status   Specimen Description BLOOD RIGHT ARM  Final   Special Requests BOTTLES DRAWN AEROBIC AND ANAEROBIC 5CC  Final   Culture   Final           BLOOD CULTURE RECEIVED NO GROWTH TO DATE CULTURE WILL BE HELD FOR 5 DAYS BEFORE ISSUING A FINAL NEGATIVE REPORT Performed at [facility]    Report Status PENDING  Incomplete  Blood Culture (routine x 2)     Status: None (Preliminary result)   Collection Time: 09/24/14  7:17 PM  Result Value Ref Range Status   Specimen Description BLOOD LEFT FOREARM  Final   Special Requests BOTTLES DRAWN AEROBIC AND ANAEROBIC 5CC  Final   Culture   Final           BLOOD CULTURE RECEIVED NO GROWTH TO  DATE CULTURE WILL BE HELD FOR 5 DAYS BEFORE ISSUING A FINAL NEGATIVE REPORT Performed at [facility]    Report Status PENDING  Incomplete  Urine culture     Status: None   Collection Time: 09/24/14  7:23 PM  Result Value Ref Range Status   Specimen Description URINE, RANDOM  Final   Special Requests NONE  Final   Colony Count   Final    8,000 COLONIES/ML Performed at [facility]    Culture   Final    INSIGNIFICANT GROWTH Performed at [facility]    Report Status 09/25/2014 FINAL  Final  MRSA PCR Screening     Status: None   Collection Time: 09/25/14  7:00 AM  Result Value Ref Range Status   MRSA by PCR NEGATIVE NEGATIVE Final    Comment:        The GeneXpert MRSA Assay (FDA approved for NASAL specimens only), is one component of a comprehensive MRSA colonization surveillance program. It is not intended to diagnose MRSA infection nor to guide or monitor treatment for MRSA infections.     Anti-infectives    Start     Dose/Rate Route Frequency Ordered Stop   09/25/14 0300  piperacillin-tazobactam (ZOSYN) IVPB 2.25 g     2.25 g 100 mL/hr over 30 Minutes Intravenous 3 times per day 09/24/14 2334  09/25/14 0200  piperacillin-tazobactam (ZOSYN) IVPB 2.25 g  Status:  Discontinued     2.25 g 100 mL/hr over 30 Minutes Intravenous Every 6 hours 09/24/14 2030 09/24/14 2323   09/24/14 2330  piperacillin-tazobactam (ZOSYN) IVPB 2.25 g  Status:  Discontinued     2.25 g 100 mL/hr over 30 Minutes Intravenous 3 times per day 09/24/14 2323 09/24/14 2334   09/24/14 2100  vancomycin (VANCOCIN) IVPB 750 mg/150 ml premix     750 mg 150 mL/hr over 60 Minutes Intravenous  Once 09/24/14 2029 09/24/14 2325   09/24/14 1915  piperacillin-tazobactam (ZOSYN) IVPB 3.375 g     3.375 g 100 mL/hr over 30 Minutes Intravenous  Once 09/24/14 1911 09/24/14 2010   09/24/14 1915  vancomycin (VANCOCIN) IVPB 1000 mg/200 mL premix     1,000 mg 200 mL/hr over 60 Minutes  Intravenous  Once 09/24/14 1911 09/24/14 2102      Assessment: 49 yo male with AKI on CKD, bilateral lower leg cellulitis and concern for sepsis on zosyn and vancomycin. WBC= 12.4, afeb, LA= 1.2 (max= 1.94), PCT= 3.32, SCr= 7.35 (last SCr was 5.85 02/2014). and CrCL ~ 10.   -A vancomycin load of  was given on 09/24/14 with plans to watch renal function.  Vanc 6/1>> Zosyn 6/1>>  6/1 blood x2- ngtd 6/1 urine- insignif growth  Goal of Therapy:  Vancomycin trough level 15-20 mcg/ml  Plan:  -No zosyn changes needed -Will continue vancomycin as  IV q48hr (next dose at 10pm tonight) -Will follow renal function, cultures and clinical progress  Harland German, Pharm D 09/26/2014 9:07 AM

## 2014-09-26 NOTE — Progress Notes (Signed)
  Echocardiogram 2D Echocardiogram has been performed.  Delcie RochENNINGTON, Adesuwa Osgood 09/26/2014, 9:17 AM

## 2014-09-26 NOTE — Progress Notes (Signed)
[place] TEAM 1 - Stepdown/ICU TEAM Progress Note  [name] ZOX:[id number] DOB: [birth year] DOA: 09/24/2014 PCP: [name], NP  Admit HPI / Brief Narrative: 49 y.o. WM PMHx hypertension, hyperlipidemia, diabetes mellitus, GERD, CAD(s/P of CABG 2010), combined systolic and diastolic congestive heart failure (EF 20-25% with grade 3 diastolic dysfunction), chronic kidney disease-stage IV, severe PAD, substance abuse, who presents with right leg swelling and pain.  Patient is a poor historian. It seems that he has been having right leg pain and swelling in the past several days. His leg becomes red from foot to upper thigh medially. He does not have fever or chills. Patient was unable to ambulate over the past 2 days secondary to pain on the right leg. He reports a generalized weakness. States he has decreased urinary output and constipation, states he has not been eating and drinking regularly. Patient states that he is living with his mother.  In ED, patient was found to have WBC 13.8, tachycardia, normal temperature, stable blood pressure, AoCKD-IV, negative chest x-ray for acute abnormalities, ALP 168, AST 27, ALT 15, total bilirubin 4.4, lactate 1.85, INR 1.39, urinalysis with small amount of leukocytes. Patient is admitted to inpatient for further evaluation and treatment. Renal was consulted by ED  HPI/Subjective: 6/3 A/O 0 patient somnolent and difficult to arouse, but will answer simple questions however as soon as patient not directly engaged will go back to sleep.  Assessment/Plan: Metabolic Encephalopathy -Multifactorial to include uremia, worsening cardiac function, iatrogenic (sedating medication) -Although currently nephrology does not want to conduct HD, if patient's mental status has not improved in the a.m. will have to discuss with nephrology options since uremia certainly playing into the picture.  Bilateral lower leg Cellulitis  and sepsis due to unspecified organism:    -Meets sepsis criteria with leukocytosis and tachycardia  -continue empiric anabiotic  - Continue normal saline 75 ml/hr; watch for fluid overload (patient's EF 20 to 25% and AoCKD-IV, limiting aggressive IVF) - Blood cultures x 2 pending  Jaundice (multiple tattoos which appear to be homemade?) -Acute Hepatitis panel pending - RPR negative -anti-nuclear antibody pending -HIV negative  PAD: - Patient had arterial Doppler 2013, which showed Right and left anterior tibial arteries and Left SFA-occluded; Right CIA-<50% diameter reduction; Right Deep Profunda-70-99% diameter reduction; Right SFA->60% diameter reduction; Right Distal Popliteal/Tibial Artery- >60% diameter reduction; Left CFA and Profunda- >50% diameter reduction. -may need to consult to vascular surgeon or follow up with them.  CAD in native artery and s/p of CABG 2010:  - EKG showed old left bundle blockage. -continue Imdur 60 mg daily  -Metoprol 50 mg BID  Chronic Systolic CHF/Cardiomyopathy  -2-D echo on 02/21/14 showed EF 20-25% with grade 3 diastolic dysfunction.  - seen by Dr. Royann Shivers on 08/07/14. Per Dr. Royann Shivers, the decisions regarding cardiac device therapy is depending heavily on his decision regarding dialysis. Subcutaneous ICD was considered if he starts dialysis. -Currently patient not in HD candidate -6/3 Echocardiogram; shows cardiomyopathy with worsening EF see results below   HTN: -See CAD  Hyperlipidemia:  -Obtain lipid panel  DM type-II uncontrolled:  -Last A1c 10.5 on 03/19/11.,  -Hemoglobin A1c pending -ContinueLevemir 3 units  --Sensitive SSI  Acute on chronic renal failure( Previous creatinine was a 5.85 on 03/11/14) -Be very judicious in the use of narcotics and benzodiazepine was as patient will not clearly impaired. -Current Cr= 7. 35 -Sodium bicarbonate 1 Amp (50 mEq), patient currently unable to take PO -Follow-up renal Recs -IVF as above -  Avoid ACEI and NSAIDs -Hold Diuretics  now   Hypokalemia -Potassium goal>4 -Potassium IV 10 mEq 3 runs   Code Status: FULL Family Communication: no family present at time of exam Disposition Plan: Resolution sepsis    Consultants: Dr.James Kerry KassW Lin (nephrology)  Procedure/Significant Events: 6/3 echocardiogram;- Left ventricle: The cavity size was mildly dilated. mild LVH.  -LVEF= 20%. Diffuse hypokinesis. - Left atrium:  moderately dilated.- Right ventricle: mildly to moderately dilated.  - Right atrium: The atrium was mildly dilated.   Culture 6/1 blood right arm left forearm NGTD 6/1 urine insignificant growth 6/2 MRSA by PCR negative 6/1 HIV negative 6/3 RPR negative 6/3 hepatitis panel pending   Antibiotics: Vancomycin 6 /1>> Zosyn 6/2>>  DVT prophylaxis: Subcutaneous heparin   Devices    LINES / TUBES:      Continuous Infusions: . sodium chloride 50 mL/hr at 09/26/14 1300    Objective: VITAL SIGNS: Temp: 97.4 F (36.3 C) (06/03 1900) Temp Source: Axillary (06/03 1900) BP: 150/78 mmHg (06/03 1900) Pulse Rate: 103 (06/03 1628) SPO2; FIO2:   Intake/Output Summary (Last 24 hours) at 09/26/14 2044 Last data filed at 09/26/14 1800  Gross per 24 hour  Intake 1307.17 ml  Output   1350 ml  Net -42.83 ml     Exam: General: A/O 0, jaundiced, cachectic, difficult to arouse No acute respiratory distress Eyes: retinal hemorrhage, sluggish reaction pupils to light. ENT: Negative Runny nose, Neck:  Negative scars, masses, torticollis, lymphadenopathy, JVD Lungs: Clear to auscultation bilaterally without wheezes or crackles Cardiovascular: Tachycardic, Regular Rhythm without murmur gallop or rub normal S1 and S2 Abdomen:negative abdominal pain, negative dysphagia, nondistended, soft, bowel sounds positive, no rebound, no ascites, no appreciable mass Extremities: Bilateral lower extremity erythema on the right from waist down, on left knee down, on left thigh petechia (improved  today) Psychiatric:  Unable to assess  Neurologic:  Unable to assess    Data Reviewed: Basic Metabolic Panel:  Recent Labs Lab 09/24/14 1905 09/25/14 0410 09/26/14 0345  NA 136 135 138  K 3.8 3.7 3.3*  CL 94* 97* 97*  CO2 22 19* 19*  GLUCOSE 129* 115* 75  BUN 130* 128* 130*  CREATININE 7.95* 7.61* 7.35*  CALCIUM 9.0 8.2* 8.3*  MG  --   --  2.4   Liver Function Tests:  Recent Labs Lab 09/24/14 1905 09/25/14 0410 09/26/14 0345  AST 27 21 18   ALT 15* 13* 13*  ALKPHOS 168* 126 113  BILITOT 4.4* 3.5* 3.5*  PROT 7.0 5.9* 6.2*  ALBUMIN 3.0* 2.4* 2.4*    Recent Labs Lab 09/26/14 0100  LIPASE 106*   No results for input(s): AMMONIA in the last 168 hours. CBC:  Recent Labs Lab 09/24/14 1905 09/25/14 0410 09/26/14 0345  WBC 13.8* 11.0* 12.4*  NEUTROABS 13.0*  --  11.6*  HGB 12.8* 10.8* 11.5*  HCT 38.0* 31.9* 34.2*  MCV 86.8 87.4 87.2  PLT 167 132* 145*   Cardiac Enzymes: No results for input(s): CKTOTAL, CKMB, CKMBINDEX, TROPONINI in the last 168 hours. BNP (last 3 results)  Recent Labs  09/25/14 0410  BNP 2227.0*    ProBNP (last 3 results) No results for input(s): PROBNP in the last 8760 hours.  CBG:  Recent Labs Lab 09/26/14 0822 09/26/14 1216 09/26/14 1653 09/26/14 1736 09/26/14 2006  GLUCAP 109* 76 69 110* 84    Recent Results (from the past 240 hour(s))  Blood Culture (routine x 2)     Status: None (Preliminary result)  Collection Time: 09/24/14  7:17 PM  Result Value Ref Range Status   Specimen Description BLOOD RIGHT ARM  Final   Special Requests BOTTLES DRAWN AEROBIC AND ANAEROBIC 5CC  Final   Culture   Final           BLOOD CULTURE RECEIVED NO GROWTH TO DATE CULTURE WILL BE HELD FOR 5 DAYS BEFORE ISSUING A FINAL NEGATIVE REPORT Performed at [facility]    Report Status PENDING  Incomplete  Blood Culture (routine x 2)     Status: None (Preliminary result)   Collection Time: 09/24/14  7:17 PM  Result Value Ref Range  Status   Specimen Description BLOOD LEFT FOREARM  Final   Special Requests BOTTLES DRAWN AEROBIC AND ANAEROBIC 5CC  Final   Culture   Final           BLOOD CULTURE RECEIVED NO GROWTH TO DATE CULTURE WILL BE HELD FOR 5 DAYS BEFORE ISSUING A FINAL NEGATIVE REPORT Performed at [facility]    Report Status PENDING  Incomplete  Urine culture     Status: None   Collection Time: 09/24/14  7:23 PM  Result Value Ref Range Status   Specimen Description URINE, RANDOM  Final   Special Requests NONE  Final   Colony Count   Final    8,000 COLONIES/ML Performed at [facility]    Culture   Final    INSIGNIFICANT GROWTH Performed at [facility]    Report Status 09/25/2014 FINAL  Final  MRSA PCR Screening     Status: None   Collection Time: 09/25/14  7:00 AM  Result Value Ref Range Status   MRSA by PCR NEGATIVE NEGATIVE Final    Comment:        The GeneXpert MRSA Assay (FDA approved for NASAL specimens only), is one component of a comprehensive MRSA colonization surveillance program. It is not intended to diagnose MRSA infection nor to guide or monitor treatment for MRSA infections.      Studies:  Recent x-ray studies have been reviewed in detail by the Attending Physician  Scheduled Meds:  Scheduled Meds: . dextrose      . ferrous sulfate  325 mg Oral Q breakfast  . folic acid  1 mg Oral Daily  . heparin  5,000 Units Subcutaneous 3 times per day  . insulin aspart  0-9 Units Subcutaneous 6 times per day  . insulin detemir  3 Units Subcutaneous QAC breakfast  . isosorbide mononitrate  60 mg Oral Daily  . metoprolol  50 mg Oral BID  . multivitamin with minerals  1 tablet Oral Daily  . pantoprazole  40 mg Oral Daily  . piperacillin-tazobactam (ZOSYN)  IV  2.25 g Intravenous 3 times per day  . sodium bicarbonate  1,300 mg Oral BID  . sodium chloride  3 mL Intravenous Q12H  . thiamine  100 mg Oral Daily   Or  . thiamine  100 mg Intravenous Daily  .  vancomycin  1,000 mg Intravenous Q48H    Time spent on care of this patient: 40 mins   Sharelle Burditt, Roselind Messier , MD  [facility] Office  [phone] Pager [phone]  On-Call/Text Page:      [email or web address]      password [email or web address]  If 7PM-7AM, please contact night-coverage [email or web address] Password [email or web address] 09/26/2014, 8:44 PM   LOS: 2 days   Care during the described time interval was provided by me .  I have reviewed this patient's  available data, including medical history, events of note, physical examination, and all test results as part of my evaluation. I have personally reviewed and interpreted all radiology studies.   Dia Crawford, MD [phone] Pager

## 2014-09-26 NOTE — Clinical Social Work Note (Signed)
Clinical Social Work Assessment  Patient Details  Name: [name] MRN: [id number] Date of Birth: 07/13/1965  Date of referral:  09/26/14               Reason for consult:  Facility Placement                Permission sought to share information with:  Facility Medical sales representativeContact Representative, Family Supports Permission granted to share information::  No (pt disoriented)  Name::     [name] Camera operatorHarper  Agency::  [place] [facility]  Relationship::  mom  Contact Information:     Housing/Transportation Living arrangements for the past 2 months:  Single Family Home Source of Information:  Parent, Other (Comment Required) (sibling) Patient Interpreter Needed:  None Criminal Activity/Legal Involvement Pertinent to Current Situation/Hospitalization:  No - Comment as needed Significant Relationships:  Parents, Siblings Lives with:  Parents, Siblings Do you feel safe going back to the place where you live?  Yes Need for family participation in patient care:  Yes (Comment)  Care giving concerns:  Pt lives at home with his sister and mother both of whom work full time jobs and are unable to be at home during the day   Office managerocial Worker assessment / plan:  CSW spoke with pt sister and mom concerning PT recommendation for SNF  Employment status:    Insurance information:  Medicare PT Recommendations:  Skilled Nursing Facility Information / Referral to community resources:  Skilled Nursing Facility  Patient/Family's Response to care:  Patients sister, Ms. [name], was unsure about SNF- would not want to send the pt anywhere he did not want to go and stated that if the pt wanted to return home then they would find a way to make it work.  Pt sister is agreeable to seeing what SNF options there are but is not sure about sending pt to SNF- states that her grandmother went to SNF and was poorly treated there and is hesitant to try again.  Patient/Family's Understanding of and Emotional Response to Diagnosis,  Current Treatment, and Prognosis:  Patients family does not seem to have a good understanding of pt current condition  Emotional Assessment Appearance:   (not assessed at this time) Attitude/Demeanor/Rapport:  Unable to Assess Affect (typically observed):  Unable to Assess Orientation:  Oriented to Self, Oriented to Place Alcohol / Substance use:  Not Applicable Psych involvement (Current and /or in the community):  No (Comment)  Discharge Needs  Concerns to be addressed:  Discharge Planning Concerns Readmission within the last 30 days:  No Current discharge risk:  Physical Impairment Barriers to Discharge:  Continued Medical Work up   [name] RibasHoloman, [name] Nohr M, LCSW 09/26/2014, 4:09 PM

## 2014-09-26 NOTE — Progress Notes (Signed)
*  PRELIMINARY RESULTS* Vascular Ultrasound Lower extremity venous duplex has been completed. Technically limited due to patient movement and pushing me away. No obvious DVT noted bilaterally.   Farrel DemarkJill Eunice, RDMS, RVT  09/26/2014, 1:01 PM

## 2014-09-26 NOTE — Progress Notes (Signed)
Patient ID: [name], male   DOB: [birth year], 49 y.o.   MRN: [id number]  [facility] Progress Note    Assessment/ Plan:   1.AKI on CKD IV/V: appears to be hemodynamically mediated with intravascular volume depletion following decreased oral intake and recent increase in diuresis- cannot rule out the possibility of ATN from sepsis. With impaired mental status and unreliable PO intake- continue saline to 50 mL/h. Urine output fair. No evidence of obstruction on renal ultrasound. No indicators for HD yet (mental status appears to wax and wane). 2. History of congestive heart failure/ischemic cardiomyopathy: appears to be compensated at this time, rate of IV fluids decreased as a precaution to avoid decompensation of heart failure. 2D echo being done today 3. Anemia: hemoglobin lower and likely reflective of dilution following intravenous fluids-monitor trend, check iron studies, he remains on oral ferrous sulfate 4. Hypertension: blood pressures currently fairly controlled on hydralazine, metoprolol and isosorbide 5. Cellulitis of right leg/sepsis: Status post intravenous fluids and now on empiric antibody therapy with vancomycin and Zosyn 6. Hypokalemia: replace orally 7. Metabolic acidosis:acute on chronic from AKI on CKD- increase PO bicarbonate dose  Subjective:   Denies complaints---primarily mumbles to questions   Objective:   BP 161/102 mmHg  Pulse 100  Temp(Src) 97.6 F (36.4 C) (Oral)  Resp 14  Ht 5\' 11"  (1.803 m)  Wt 84.3 kg (185 lb 13.6 oz)  BMI 25.93 kg/m2  SpO2 100%  Intake/Output Summary (Last 24 hours) at 09/26/14 0815 Last data filed at 09/26/14 0640  Gross per 24 hour  Intake    448 ml  Output   1400 ml  Net   -952 ml   Weight change: -1.884 kg (-4 lb 2.4 oz)  Physical Exam: WUJ:WJXBJYNWGGen:somnolent but arousable with voice NFA:OZHYQCVS:pulse regular tachycardia, normal s1 and s2 Resp:Coarse bilaterally, no rales/rhonchi MVH:QIONAbd:soft, flat, NT GEX:BMWUXExt:Right LE cellulitis  with skin ulcers, milder left LE cellulitis   Imaging: [facility]  09/25/2014   CLINICAL DATA:  Acute on chronic renal failure.  EXAM: RENAL / URINARY TRACT ULTRASOUND COMPLETE  COMPARISON:  Abdominal ultrasound 01/14/2014  FINDINGS: Right Kidney:  Length: 9.4 cm. Thinning of the renal parenchyma with increased cortical echogenicity. No mass or hydronephrosis visualized.  Left Kidney:  Length: 9.2 cm. Thinning of the renal parenchyma and increased renal echogenicity. No mass or hydronephrosis visualized.  Bladder:  Appears normal for degree of bladder distention.  Incidental: There is a moderate volume of ascites in the abdomen and pelvis.  IMPRESSION: 1. Small echogenic kidneys consistent chronic medical renal disease. No obstructive uropathy. 2. Moderate volume of intra-abdominal and pelvic ascites.   Electronically Signed   By: Rubye OaksMelanie  Ehinger M.D.   On: 09/25/2014 01:16   Dg Chest Port 1 View  09/24/2014   CLINICAL DATA:  Weakness and lower extremity edema.  EXAM: PORTABLE CHEST - 1 VIEW  COMPARISON:  09/28/2013  FINDINGS: Stable mild cardiac enlargement and evidence of prior CABG. Lung volumes are low bilaterally. There is no evidence of pulmonary edema, consolidation, pneumothorax, nodule or pleural fluid.  IMPRESSION: Stable mild cardiomegaly.  Low lung volumes without acute findings.   Electronically Signed   By: Irish LackGlenn  Yamagata M.D.   On: 09/24/2014 19:38    Labs: BMET  Recent Labs Lab 09/24/14 1905 09/25/14 0410 09/26/14 0345  NA 136 135 138  K 3.8 3.7 3.3*  CL 94* 97* 97*  CO2 22 19* 19*  GLUCOSE 129* 115* 75  BUN 130* 128* 130*  CREATININE  7.95* 7.61* 7.35*  CALCIUM 9.0 8.2* 8.3*   CBC  Recent Labs Lab 09/24/14 1905 09/25/14 0410 09/26/14 0345  WBC 13.8* 11.0* 12.4*  NEUTROABS 13.0*  --  11.6*  HGB 12.8* 10.8* 11.5*  HCT 38.0* 31.9* 34.2*  MCV 86.8 87.4 87.2  PLT 167 132* 145*    Medications:    . ferrous sulfate  325 mg Oral Q breakfast  . folic acid  1 mg Oral  Daily  . heparin  5,000 Units Subcutaneous 3 times per day  . insulin aspart  0-9 Units Subcutaneous 6 times per day  . insulin detemir  3 Units Subcutaneous QAC breakfast  . isosorbide mononitrate  60 mg Oral Daily  . LORazepam  0-4 mg Intravenous Q6H   Followed by  . [START ON 09/27/2014] LORazepam  0-4 mg Intravenous Q12H  . metoprolol  50 mg Oral BID  . multivitamin with minerals  1 tablet Oral Daily  . pantoprazole  40 mg Oral Daily  . piperacillin-tazobactam (ZOSYN)  IV  2.25 g Intravenous 3 times per day  . sodium bicarbonate      . sodium bicarbonate  650 mg Oral BID  . sodium chloride  3 mL Intravenous Q12H  . thiamine  100 mg Oral Daily   Or  . thiamine  100 mg Intravenous Daily   Zetta Bills, MD 09/26/2014, 8:15 AM

## 2014-09-27 ENCOUNTER — Inpatient Hospital Stay (HOSPITAL_COMMUNITY): Payer: Medicare Other

## 2014-09-27 DIAGNOSIS — I5022 Chronic systolic (congestive) heart failure: Secondary | ICD-10-CM | POA: Diagnosis present

## 2014-09-27 LAB — GLUCOSE, CAPILLARY
GLUCOSE-CAPILLARY: 89 mg/dL (ref 65–99)
Glucose-Capillary: 76 mg/dL (ref 65–99)
Glucose-Capillary: 79 mg/dL (ref 65–99)
Glucose-Capillary: 83 mg/dL (ref 65–99)
Glucose-Capillary: 93 mg/dL (ref 65–99)
Glucose-Capillary: 96 mg/dL (ref 65–99)

## 2014-09-27 LAB — COMPREHENSIVE METABOLIC PANEL
ALBUMIN: 2.2 g/dL — AB (ref 3.5–5.0)
ALK PHOS: 99 U/L (ref 38–126)
ALT: 11 U/L — AB (ref 17–63)
ANION GAP: 19 — AB (ref 5–15)
AST: 17 U/L (ref 15–41)
BILIRUBIN TOTAL: 3.2 mg/dL — AB (ref 0.3–1.2)
BUN: 130 mg/dL — ABNORMAL HIGH (ref 6–20)
CO2: 21 mmol/L — ABNORMAL LOW (ref 22–32)
Calcium: 8.2 mg/dL — ABNORMAL LOW (ref 8.9–10.3)
Chloride: 100 mmol/L — ABNORMAL LOW (ref 101–111)
Creatinine, Ser: 7.35 mg/dL — ABNORMAL HIGH (ref 0.61–1.24)
GFR, EST AFRICAN AMERICAN: 9 mL/min — AB (ref >60)
GFR, EST NON AFRICAN AMERICAN: 8 mL/min — AB (ref >60)
GLUCOSE: 98 mg/dL (ref 65–99)
Potassium: 3.4 mmol/L — ABNORMAL LOW (ref 3.5–5.1)
Sodium: 140 mmol/L (ref 135–145)
TOTAL PROTEIN: 5.8 g/dL — AB (ref 6.5–8.1)

## 2014-09-27 LAB — CBC WITH DIFFERENTIAL/PLATELET
Basophils Absolute: 0 10*3/uL (ref 0.0–0.1)
Basophils Relative: 0 % (ref 0–1)
Eosinophils Absolute: 0 10*3/uL (ref 0.0–0.7)
Eosinophils Relative: 0 % (ref 0–5)
HCT: 31.4 % — ABNORMAL LOW (ref 39.0–52.0)
Hemoglobin: 10.4 g/dL — ABNORMAL LOW (ref 13.0–17.0)
Lymphocytes Relative: 5 % — ABNORMAL LOW (ref 12–46)
Lymphs Abs: 0.6 10*3/uL — ABNORMAL LOW (ref 0.7–4.0)
MCH: 28.8 pg (ref 26.0–34.0)
MCHC: 33.1 g/dL (ref 30.0–36.0)
MCV: 87 fL (ref 78.0–100.0)
Monocytes Absolute: 0.3 10*3/uL (ref 0.1–1.0)
Monocytes Relative: 3 % (ref 3–12)
NEUTROS PCT: 92 % — AB (ref 43–77)
Neutro Abs: 10.8 10*3/uL — ABNORMAL HIGH (ref 1.7–7.7)
Platelets: 156 10*3/uL (ref 150–400)
RBC: 3.61 MIL/uL — ABNORMAL LOW (ref 4.22–5.81)
RDW: 15 % (ref 11.5–15.5)
WBC: 11.7 10*3/uL — ABNORMAL HIGH (ref 4.0–10.5)

## 2014-09-27 LAB — HEMOGLOBIN A1C
Hgb A1c MFr Bld: 6.4 % — ABNORMAL HIGH (ref 4.8–5.6)
Mean Plasma Glucose: 137 mg/dL

## 2014-09-27 LAB — HEPATITIS PANEL, ACUTE
HEP A IGM: NEGATIVE — AB
HEP B S AG: NEGATIVE — AB
Hep B C IgM: NEGATIVE — AB

## 2014-09-27 LAB — PROTIME-INR
INR: 1.45 (ref 0.00–1.49)
PROTHROMBIN TIME: 17.8 s — AB (ref 11.6–15.2)

## 2014-09-27 LAB — MAGNESIUM: Magnesium: 2.3 mg/dL (ref 1.7–2.4)

## 2014-09-27 LAB — LACTIC ACID, PLASMA: Lactic Acid, Venous: 1.2 mmol/L (ref 0.5–2.0)

## 2014-09-27 MED ORDER — HEPARIN SODIUM (PORCINE) 1000 UNIT/ML IJ SOLN
4000.0000 [IU] | Freq: Once | INTRAMUSCULAR | Status: DC
  Filled 2014-09-27: qty 4

## 2014-09-27 MED ORDER — HEPARIN SODIUM (PORCINE) 1000 UNIT/ML DIALYSIS: 4000.0000 [IU] | Freq: Once | INTRAMUSCULAR | Status: DC

## 2014-09-27 MED ORDER — POTASSIUM CHLORIDE 10 MEQ/100ML IV SOLN
10.0000 meq | INTRAVENOUS | Status: AC
  Administered 2014-09-27 (×3): 10 meq via INTRAVENOUS
  Filled 2014-09-27 (×3): qty 100

## 2014-09-27 NOTE — Progress Notes (Signed)
Pharmacist Heart Failure Core Measure Documentation  Assessment: [name] has an EF documented as 20% on 6/3 by echo.  Rationale: Heart failure patients with left ventricular systolic dysfunction (LVSD) and an EF < 40% should be prescribed an angiotensin converting enzyme inhibitor (ACEI) or angiotensin receptor blocker (ARB) at discharge unless a contraindication is documented in the medical record.  This patient is not currently on an ACEI or ARB for HF.  This note is being placed in the record in order to provide documentation that a contraindication to the use of these agents is present for this encounter.  ACE Inhibitor or Angiotensin Receptor Blocker is contraindicated (specify all that apply)  []   ACEI allergy AND ARB allergy []   Angioedema []   Moderate or severe aortic stenosis []   Hyperkalemia []   Hypotension []   Renal artery stenosis [x]   Worsening renal function, preexisting renal disease or dysfunction   [name], PharmD Clinical Pharmacy Resident Pager: [phone] 09/27/2014 2:50 PM

## 2014-09-27 NOTE — Progress Notes (Addendum)
Patient ID: [name], male   DOB: 28-Nov-1965, 49 y.o.   MRN: [id number]  [facility] Progress Note    Assessment/ Plan:   1. 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 With impaired mental status and unreliable PO intake- continue saline 50 mL/h. Urine output fair. No evidence of obstruction on renal ultrasound. Discussed with Dr. Ailene Ards begin the patient on dialysis today via temporary dialysis catheter and begin the process for permanent access next week. 2. History of congestive heart failure/ischemic cardiomyopathy: appears to be compensated at this time, rate of IV fluids slowed to 50/hr as a precaution to avoid decompensation of heart failure. 2D echo shows LVEF 20% 3. Anemia: hemoglobin lower and likely reflective of dilution following intravenous fluids-monitor trend, borderline Tsat on oral ferrous sulfate 4. Hypertension: blood pressures currently fairly controlled on hydralazine, metoprolol and isosorbide 5. Cellulitis of legs/sepsis: Status post intravenous fluids and now on empiric antibody therapy with vancomycin and Zosyn. Mental status continues to wax and wane 6. Hypokalemia: replace orally 7. Metabolic acidosis:acute on chronic from AKI on CKD- increase PO bicarbonate dose  Subjective:   No acute events overnight   Objective:   BP 115/87 mmHg  Pulse 77  Temp(Src) 97.4 F (36.3 C) (Oral)  Resp 16  Ht  (1.803 m)  Wt 83 kg (182 lb 15.7 oz)  BMI 25.53 kg/m2  SpO2 99%  Intake/Output Summary (Last 24 hours) at 09/27/14 0831 Last data filed at 09/27/14 0646  Gross per 24 hour  Intake 1662.17 ml  Output   1000 ml  Net 662.17 ml   Weight change: -1.3 kg (-2 lb 13.9 oz)  Physical Exam: VQQ:VZDGLOV to voice but largely unresponsive to questions FIE:PPIRJ RRR, normal s1 and  s2 Resp:Coarse bilaterally, no rales/rhonchi JOA:CZYS,AYTK, NT ZSW:FUXNA LE with improving erythema, LLE with nearly resolved erythema  Imaging: No results found.  Labs: BMET  Recent Labs Lab 09/24/14 1905 09/25/14 0410 09/26/14 0345 09/27/14 0236  NA 136 135 138 140  K 3.8 3.7 3.3* 3.4*  CL 94* 97* 97* 100*  CO2 22 19* 19* 21*  GLUCOSE 129* 115* 75 98  BUN 130* 128* 130* 130*  CREATININE 7.95* 7.61* 7.35* 7.35*  CALCIUM 9.0 8.2* 8.3* 8.2*   CBC  Recent Labs Lab 09/24/14 1905 09/25/14 0410 09/26/14 0345 09/27/14 0236  WBC 13.8* 11.0* 12.4* 11.7*  NEUTROABS 13.0*  --  11.6* 10.8*  HGB 12.8* 10.8* 11.5* 10.4*  HCT 38.0* 31.9* 34.2* 31.4*  MCV 86.8 87.4 87.2 87.0  PLT 167 132* 145* 156    Medications:    . ferrous sulfate  325 mg Oral Q breakfast  . folic acid  1 mg Oral Daily  . heparin  5,000 Units Subcutaneous 3 times per day  . insulin aspart  0-9 Units Subcutaneous 6 times per day  . insulin detemir  3 Units Subcutaneous QAC breakfast  . isosorbide mononitrate  60 mg Oral Daily  . metoprolol  50 mg Oral BID  . multivitamin with minerals  1 tablet Oral Daily  . pantoprazole  40 mg Oral Daily  . piperacillin-tazobactam (ZOSYN)  IV  2.25 g Intravenous 3 times per day  . sodium bicarbonate  1,300 mg Oral BID  . sodium chloride  3 mL Intravenous Q12H  . thiamine  100 mg Oral Daily   Or  .  thiamine  100 mg Intravenous Daily  . vancomycin  1,000 mg Intravenous Q48H   Zetta BillsJay Rhylan Gross, MD 09/27/2014, 8:31 AM

## 2014-09-27 NOTE — Progress Notes (Signed)
[facility] TEAM 1 - Stepdown/ICU TEAM Progress Note  [name] WRU:[id number] DOB: 1966/04/08 DOA: 09/24/2014 PCP: [name], NP  Admit HPI / Brief Narrative: 49 y.o. WM PMHx hypertension, hyperlipidemia, diabetes mellitus, GERD, CAD(s/P of CABG 2010), combined systolic and diastolic congestive heart failure (EF 20-25% with grade 3 diastolic dysfunction), chronic kidney disease-stage IV, severe PAD, substance abuse, who presents with right leg swelling and pain.  Patient is a poor historian. It seems that he has been having right leg pain and swelling in the past several days. His leg becomes red from foot to upper thigh medially. He does not have fever or chills. Patient was unable to ambulate over the past 2 days secondary to pain on the right leg. He reports a generalized weakness. States he has decreased urinary output and constipation, states he has not been eating and drinking regularly. Patient states that he is living with his mother.  In ED, patient was found to have WBC 13.8, tachycardia, normal temperature, stable blood pressure, AoCKD-IV, negative chest x-ray for acute abnormalities, ALP 168, AST 27, ALT 15, total bilirubin 4.4, lactate 1.85, INR 1.39, urinalysis with small amount of leukocytes. Patient is admitted to inpatient for further evaluation and treatment. Renal was consulted by ED  HPI/Subjective: 6/4 A/O 0 patient somnolent and difficult to arouse, but will answer simple questions however as soon as patient not directly engaged will go back to sleep.  Assessment/Plan: Metabolic Encephalopathy -Multifactorial to include uremia, worsening cardiac function, iatrogenic (sedating medication) -Although currently nephrology does not want to conduct HD, if patient's mental status has not improved in the a.m. will have to discuss with nephrology options since uremia certainly playing into the picture. -Believe largest portion of the cause of patient's encephalopathy is his  uremia. Have decreased significantly sedating medication which has helped. -Have spoken with Dr. Zetta Bills (nephrology) and he agrees patient requires HD  -Contacted PCCM for placement of HD cath  Bilateral lower leg Cellulitis  and sepsis due to unspecified organism:  -Meets sepsis criteria with leukocytosis and tachycardia  -continue empiric anabiotic  - Continue normal saline 50 ml/hr; watch for fluid overload (patient's EF 20 to 25% and AoCKD-IV, limiting aggressive IVF) - Blood cultures x 2 NGTD  Jaundice (multiple tattoos which appear to be homemade?) -Acute Hepatitis panel negative - RPR negative -anti-nuclear antibody pending -HIV negative  PAD: - Patient had arterial Doppler 2013, which showed Right and left anterior tibial arteries and Left SFA-occluded; Right CIA-<50% diameter reduction; Right Deep Profunda-70-99% diameter reduction; Right SFA->60% diameter reduction; Right Distal Popliteal/Tibial Artery- >60% diameter reduction; Left CFA and Profunda- >50% diameter reduction. -may need to consult to vascular surgeon or follow up with them.  CAD in native artery and s/p of CABG 2010:  - EKG showed old left bundle blockage. -continue Imdur 60 mg daily  -Metoprol 50 mg BID  Chronic Systolic CHF/Cardiomyopathy  -2-D echo on 02/21/14 showed EF 20-25% with grade 3 diastolic dysfunction.  - seen by Dr. Royann Shivers on 08/07/14. Per Dr. Royann Shivers, the decisions regarding cardiac device therapy is depending heavily on his decision regarding dialysis. Subcutaneous ICD was considered if he starts dialysis. -6/3 Echocardiogram; shows cardiomyopathy with worsening EF see results below   HTN: -See CAD  Hyperlipidemia:  -lipid panel abnormal  DM type-II controlled:  -Last A1c 10.5 on 03/19/11.,  -6/3 Hemoglobin A1c= 6.4 -ContinueLevemir 3 units  -Sensitive SSI  Acute on chronic renal failure( Previous creatinine was a 5.85 on 03/11/14) -Be very judicious in  the use of narcotics and  benzodiazepine as patient will not clear them well . -Current Cr= 7. 35 -IVF as above -Avoid ACEI and NSAIDs -Patient to have HD today.  Hypokalemia -Potassium goal>4 -Potassium IV 10 mEq 3 runs   Code Status: FULL Family Communication: no family present at time of exam Disposition Plan: Resolution sepsis    Consultants: Dr.James Kerry Kass (nephrology) Dr. Zetta Bills (nephrology)   Procedure/Significant Events: 6/3 echocardiogram;- Left ventricle: The cavity size was mildly dilated. mild LVH.  -LVEF= 20%. Diffuse hypokinesis. - Left atrium:  moderately dilated.- Right ventricle: mildly to moderately dilated.  - Right atrium: The atrium was mildly dilated.   Culture 6/1 blood right arm left forearm NGTD 6/1 urine insignificant growth 6/2 MRSA by PCR negative 6/1 HIV negative 6/3 RPR negative 6/3 hepatitis panel negative   Antibiotics: Vancomycin 6 /1>> Zosyn 6/2>>  DVT prophylaxis: Subcutaneous heparin   Devices    LINES / TUBES:      Continuous Infusions: . sodium chloride 50 mL/hr at 09/26/14 2303    Objective: VITAL SIGNS: Temp: 97.4 F (36.3 C) (06/04 0800) Temp Source: Oral (06/04 0800) BP: 115/87 mmHg (06/04 0800) Pulse Rate: 77 (06/04 0800) SPO2; FIO2:   Intake/Output Summary (Last 24 hours) at 09/27/14 0919 Last data filed at 09/27/14 0646  Gross per 24 hour  Intake 1612.17 ml  Output    750 ml  Net 862.17 ml     Exam: General: A/O 0, jaundiced, cachectic, difficult to arouse (however easier than on 6/3), No acute respiratory distress Eyes: retinal hemorrhage, sluggish reaction pupils to light. ENT: Negative Runny nose, Neck:  Negative scars, masses, torticollis, lymphadenopathy, JVD Lungs: Clear to auscultation bilaterally without wheezes or crackles Cardiovascular: Regular Rhythm with frequent PVC, and rate without murmur gallop or rub normal S1 and S2 Abdomen:negative abdominal pain, negative dysphagia, nondistended, soft,  bowel sounds positive, no rebound, no ascites, no appreciable mass Extremities: Bilateral lower extremity erythema on the right from Hip down, on left knee down, on left thigh petechia (which appears to be resolving) Psychiatric:  Unable to assess  Neurologic:  Unable to assess    Data Reviewed: Basic Metabolic Panel:  Recent Labs Lab 09/24/14 1905 09/25/14 0410 09/26/14 0345 09/27/14 0236  NA 136 135 138 140  K 3.8 3.7 3.3* 3.4*  CL 94* 97* 97* 100*  CO2 22 19* 19* 21*  GLUCOSE 129* 115* 75 98  BUN 130* 128* 130* 130*  CREATININE 7.95* 7.61* 7.35* 7.35*  CALCIUM 9.0 8.2* 8.3* 8.2*  MG  --   --  2.4 2.3   Liver Function Tests:  Recent Labs Lab 09/24/14 1905 09/25/14 0410 09/26/14 0345 09/27/14 0236  AST ALT 15* 13* 13* 11*  ALKPHOS 168* 126 113 99  BILITOT 4.4* 3.5* 3.5* 3.2*  PROT 7.0 5.9* 6.2* 5.8*  ALBUMIN 3.0* 2.4* 2.4* 2.2*    Recent Labs Lab 09/26/14 0100  LIPASE 106*   No results for input(s): AMMONIA in the last 168 hours. CBC:  Recent Labs Lab 09/24/14 1905 09/25/14 0410 09/26/14 0345 09/27/14 0236  WBC 13.8* 11.0* 12.4* 11.7*  NEUTROABS 13.0*  --  11.6* 10.8*  HGB 12.8* 10.8* 11.5* 10.4*  HCT 38.0* 31.9* 34.2* 31.4*  MCV 86.8 87.4 87.2 87.0  PLT 167 132* 145* 156   Cardiac Enzymes: No results for input(s): CKTOTAL, CKMB, CKMBINDEX, TROPONINI in the last 168 hours. BNP (last 3 results)  Recent Labs  09/25/14 0410  BNP  2227.0*    ProBNP (last 3 results) No results for input(s): PROBNP in the last 8760 hours.  CBG:  Recent Labs Lab 09/26/14 1736 09/26/14 2006 09/27/14 0014 09/27/14 0409 09/27/14 0806  GLUCAP 110* 84 93 89 96    Recent Results (from the past 240 hour(s))  Blood Culture (routine x 2)     Status: None (Preliminary result)   Collection Time: 09/24/14  7:17 PM  Result Value Ref Range Status   Specimen Description BLOOD RIGHT ARM  Final   Special Requests BOTTLES DRAWN AEROBIC AND ANAEROBIC 5CC   Final   Culture   Final           BLOOD CULTURE RECEIVED NO GROWTH TO DATE CULTURE WILL BE HELD FOR 5 DAYS BEFORE ISSUING A FINAL NEGATIVE REPORT [facility]formed at [facility]    Report Status PENDING  Incomplete  Blood Culture (routine x 2)     Status: None (Preliminary result)   Collection Time: 09/24/14  7:17 PM  Result Value Ref Range Status   Specimen Description BLOOD LEFT FOREARM  Final   Special Requests BOTTLES DRAWN AEROBIC AND ANAEROBIC 5CC  Final   Culture   Final           BLOOD CULTURE RECEIVED NO GROWTH TO DATE CULTURE WILL BE HELD FOR 5 DAYS BEFORE ISSUING A FINAL NEGATIVE REPORT Performed at [facility]    Report Status PENDING  Incomplete  Urine culture     Status: None   Collection Time: 09/24/14  7:23 PM  Result Value Ref Range Status   Specimen Description URINE, RANDOM  Final   Special Requests NONE  Final   Colony Count   Final    8,000 COLONIES/ML Performed at [facility]    Culture   Final    INSIGNIFICANT GROWTH Performed at [facility]    Report Status 09/25/2014 FINAL  Final  MRSA PCR Screening     Status: None   Collection Time: 09/25/14  7:00 AM  Result Value Ref Range Status   MRSA by PCR NEGATIVE NEGATIVE Final    Comment:        The GeneXpert MRSA Assay (FDA approved for NASAL specimens only), is one component of a comprehensive MRSA colonization surveillance program. It is not intended to diagnose MRSA infection nor to guide or monitor treatment for MRSA infections.      Studies:  Recent x-ray studies have been reviewed in detail by the Attending Physician  Scheduled Meds:  Scheduled Meds: . ferrous sulfate  325 mg Oral Q breakfast  . folic acid  1 mg Oral Daily  . heparin  5,000 Units Subcutaneous 3 times per day  . insulin aspart  0-9 Units Subcutaneous 6 times per day  . insulin detemir  3 Units Subcutaneous QAC breakfast  . isosorbide mononitrate  60 mg Oral Daily  . metoprolol  50 mg Oral  BID  . multivitamin with minerals  1 tablet Oral Daily  . pantoprazole  40 mg Oral Daily  . piperacillin-tazobactam (ZOSYN)  IV  2.25 g Intravenous 3 times per day  . potassium chloride  10 mEq Intravenous Q1 Hr x 3  . sodium bicarbonate  1,300 mg Oral BID  . sodium chloride  3 mL Intravenous Q12H  . thiamine  100 mg Oral Daily   Or  . thiamine  100 mg Intravenous Daily  . vancomycin  1,000 mg Intravenous Q48H    Time spent on care of  this patient: 40 mins   WOODS, Roselind Messier , MD  [facility] Office  [phone] Pager [phone]  On-Call/Text Page:      [email or web address]      password [email or web address]  If 7PM-7AM, please contact night-coverage [email or web address] Password [email or web address] 09/27/2014, 9:19 AM   LOS: 3 days   Care during the described time interval was provided by me .  I have reviewed this patient's available data, including medical history, events of note, physical examination, and all test results as part of my evaluation. I have personally reviewed and interpreted all radiology studies.   Carolyne Littles, MD [phone] Pager

## 2014-09-27 NOTE — Progress Notes (Signed)
Asked by renal service to place HD cath for intermittent HD.  Ultrasound exam revealed no visible R IJ, very small L IJ.  Attempted L IJ HD cath placement.  Vessel cannulated x 2 under ultrasound guidance but unable to thread wire. Dr. Allena KatzPatel aware.  CXR pending.  Will re-attempt L groin access (extensive R leg cellulitis) later this pm.   Dirk DressKaty Leylanie Woodmansee, NP 09/27/2014  4:57 PM

## 2014-09-28 DIAGNOSIS — E119 Type 2 diabetes mellitus without complications: Secondary | ICD-10-CM | POA: Diagnosis present

## 2014-09-28 DIAGNOSIS — I5022 Chronic systolic (congestive) heart failure: Secondary | ICD-10-CM | POA: Diagnosis present

## 2014-09-28 LAB — CBC WITH DIFFERENTIAL/PLATELET
Basophils Absolute: 0 10*3/uL (ref 0.0–0.1)
Basophils Relative: 0 % (ref 0–1)
Eosinophils Absolute: 0 10*3/uL (ref 0.0–0.7)
Eosinophils Relative: 0 % (ref 0–5)
HCT: 34.9 % — ABNORMAL LOW (ref 39.0–52.0)
Hemoglobin: 11.3 g/dL — ABNORMAL LOW (ref 13.0–17.0)
LYMPHS ABS: 0.8 10*3/uL (ref 0.7–4.0)
Lymphocytes Relative: 6 % — ABNORMAL LOW (ref 12–46)
MCH: 28.4 pg (ref 26.0–34.0)
MCHC: 32.4 g/dL (ref 30.0–36.0)
MCV: 87.7 fL (ref 78.0–100.0)
Monocytes Absolute: 0.5 10*3/uL (ref 0.1–1.0)
Monocytes Relative: 4 % (ref 3–12)
Neutro Abs: 11.2 10*3/uL — ABNORMAL HIGH (ref 1.7–7.7)
Neutrophils Relative %: 90 % — ABNORMAL HIGH (ref 43–77)
Platelets: 182 10*3/uL (ref 150–400)
RBC: 3.98 MIL/uL — ABNORMAL LOW (ref 4.22–5.81)
RDW: 15 % (ref 11.5–15.5)
WBC: 12.5 10*3/uL — ABNORMAL HIGH (ref 4.0–10.5)

## 2014-09-28 LAB — GLUCOSE, CAPILLARY
GLUCOSE-CAPILLARY: 111 mg/dL — AB (ref 65–99)
GLUCOSE-CAPILLARY: 98 mg/dL (ref 65–99)
Glucose-Capillary: 105 mg/dL — ABNORMAL HIGH (ref 65–99)
Glucose-Capillary: 74 mg/dL (ref 65–99)
Glucose-Capillary: 81 mg/dL (ref 65–99)
Glucose-Capillary: 86 mg/dL (ref 65–99)

## 2014-09-28 LAB — COMPREHENSIVE METABOLIC PANEL
ALT: 12 U/L — AB (ref 17–63)
ANION GAP: 22 — AB (ref 5–15)
AST: 25 U/L (ref 15–41)
Albumin: 2.2 g/dL — ABNORMAL LOW (ref 3.5–5.0)
Alkaline Phosphatase: 96 U/L (ref 38–126)
BILIRUBIN TOTAL: 3.4 mg/dL — AB (ref 0.3–1.2)
BUN: 140 mg/dL — ABNORMAL HIGH (ref 6–20)
CALCIUM: 8.3 mg/dL — AB (ref 8.9–10.3)
CHLORIDE: 102 mmol/L (ref 101–111)
CO2: 19 mmol/L — AB (ref 22–32)
CREATININE: 7.52 mg/dL — AB (ref 0.61–1.24)
GFR calc non Af Amer: 7 mL/min — ABNORMAL LOW (ref >60)
GFR, EST AFRICAN AMERICAN: 9 mL/min — AB (ref >60)
GLUCOSE: 78 mg/dL (ref 65–99)
POTASSIUM: 4.2 mmol/L (ref 3.5–5.1)
Sodium: 143 mmol/L (ref 135–145)
Total Protein: 6 g/dL — ABNORMAL LOW (ref 6.5–8.1)

## 2014-09-28 LAB — MAGNESIUM: Magnesium: 2.4 mg/dL (ref 1.7–2.4)

## 2014-09-28 MED ORDER — LIDOCAINE-PRILOCAINE 2.5-2.5 % EX CREA: 1.0000 "application " | TOPICAL_CREAM | CUTANEOUS | Status: DC | PRN

## 2014-09-28 MED ORDER — LORAZEPAM 2 MG/ML IJ SOLN
INTRAMUSCULAR | Status: AC
  Administered 2014-09-28: 4 mg via INTRAVENOUS
  Filled 2014-09-28: qty 1

## 2014-09-28 MED ORDER — LORAZEPAM 2 MG/ML IJ SOLN
4.0000 mg | Freq: Once | INTRAMUSCULAR | Status: AC
  Administered 2014-09-28: 4 mg via INTRAVENOUS

## 2014-09-28 MED ORDER — SODIUM CHLORIDE 0.9 % IV SOLN: 100.0000 mL | INTRAVENOUS | Status: DC | PRN

## 2014-09-28 MED ORDER — LIDOCAINE HCL (PF) 1 % IJ SOLN
INTRAMUSCULAR | Status: AC
  Administered 2014-09-28: 5 mL
  Filled 2014-09-28: qty 5

## 2014-09-28 MED ORDER — LIDOCAINE HCL (PF) 1 % IJ SOLN: 5.0000 mL | INTRAMUSCULAR | Status: DC | PRN

## 2014-09-28 MED ORDER — METOPROLOL TARTRATE 1 MG/ML IV SOLN
5.0000 mg | Freq: Four times a day (QID) | INTRAVENOUS | Status: DC
  Administered 2014-09-29 – 2014-10-03 (×17): 5 mg via INTRAVENOUS
  Filled 2014-09-28 (×22): qty 5

## 2014-09-28 MED ORDER — NEPRO/CARBSTEADY PO LIQD
237.0000 mL | ORAL | Status: DC | PRN
  Filled 2014-09-28: qty 237

## 2014-09-28 MED ORDER — ALTEPLASE 2 MG IJ SOLR
2.0000 mg | Freq: Once | INTRAMUSCULAR | Status: AC | PRN
  Filled 2014-09-28: qty 2

## 2014-09-28 MED ORDER — LORAZEPAM 2 MG/ML IJ SOLN
INTRAMUSCULAR | Status: AC
  Filled 2014-09-28: qty 1

## 2014-09-28 MED ORDER — PENTAFLUOROPROP-TETRAFLUOROETH EX AERO: 1.0000 "application " | INHALATION_SPRAY | CUTANEOUS | Status: DC | PRN

## 2014-09-28 MED ORDER — HEPARIN SODIUM (PORCINE) 1000 UNIT/ML DIALYSIS: 1000.0000 [IU] | INTRAMUSCULAR | Status: DC | PRN

## 2014-09-28 MED ORDER — HYDRALAZINE HCL 20 MG/ML IJ SOLN
10.0000 mg | INTRAMUSCULAR | Status: DC
  Administered 2014-09-29 – 2014-10-03 (×25): 10 mg via INTRAVENOUS
  Filled 2014-09-28 (×8): qty 0.5
  Filled 2014-09-28: qty 1
  Filled 2014-09-28 (×3): qty 0.5
  Filled 2014-09-28: qty 1
  Filled 2014-09-28: qty 0.5
  Filled 2014-09-28: qty 1
  Filled 2014-09-28 (×16): qty 0.5
  Filled 2014-09-28: qty 1
  Filled 2014-09-28 (×2): qty 0.5
  Filled 2014-09-28: qty 1

## 2014-09-28 NOTE — Progress Notes (Signed)
Nurse Practitioner from critical care at bedside and made RN aware that yesterday unable to obtain dialysis access via IJ.  Informed Nephology that it may still be several hours until able to obtain alternative access for dialysis.

## 2014-09-28 NOTE — Progress Notes (Signed)
Trialysis catheter inserted to Left groin with Dr. Arta SilenceShertz at bedisde.  Informed that dialysis would be done around 6pm tonight.  Patient resting in bed, b/l wrist restraints to protect patient from pulling lines.

## 2014-09-28 NOTE — Procedures (Signed)
Using sterile procedure and US guidance a left femoral 20 cm 3-lumen Trialysis temporary HD catheter was placed. No major complications. Bleed easily, EBL 80cc. Ready to use.   Vinson Moselleob Wyvonne Carda MD (pgr) [phone]    ([phone]) [phone] 09/28/2014, 1:30 PM     \

## 2014-09-28 NOTE — Progress Notes (Signed)
[name] TEAM 1 - Stepdown/ICU TEAM Progress Note  [name] ZOX:[id number] DOB: Sep 13, 1965 DOA: 09/24/2014 PCP: [name], NP  Admit HPI / Brief Narrative: 49 y.o. WM PMHx hypertension, hyperlipidemia, diabetes mellitus, GERD, CAD(s/P of CABG 2010), combined systolic and diastolic congestive heart failure (EF 20-25% with grade 3 diastolic dysfunction), chronic kidney disease-stage IV, severe PAD, substance abuse, who presents with right leg swelling and pain.  Patient is a poor historian. It seems that he has been having right leg pain and swelling in the past several days. His leg becomes red from foot to upper thigh medially. He does not have fever or chills. Patient was unable to ambulate over the past 2 days secondary to pain on the right leg. He reports a generalized weakness. States he has decreased urinary output and constipation, states he has not been eating and drinking regularly. Patient states that he is living with his mother.  In ED, patient was found to have WBC 13.8, tachycardia, normal temperature, stable blood pressure, AoCKD-IV, negative chest x-ray for acute abnormalities, ALP 168, AST 27, ALT 15, total bilirubin 4.4, lactate 1.85, INR 1.39, urinalysis with small amount of leukocytes. Patient is admitted to inpatient for further evaluation and treatment. Renal was consulted by ED  HPI/Subjective: 6/5 A/O 0 patient somnolent and difficult to arouse, but will answer simple questions however as soon as patient not directly engaged will go back to sleep. NOTE; received Ativan for placement of HD cath  Assessment/Plan: Metabolic Encephalopathy -Multifactorial to include uremia, worsening cardiac function, iatrogenic (sedating medication) -Although currently nephrology does not want to conduct HD, if patient's mental status has not improved in the a.m. will have to discuss with nephrology options since uremia certainly playing into the picture. -Believe largest portion of  the cause of patient's encephalopathy is his uremia. Have decreased significantly sedating medication which has helped. -Have spoken with Dr. Zetta Bills (nephrology) and he agrees patient requires HD  -Dr. Delano Metz placed  HD cath  Bilateral lower leg Cellulitis  and sepsis due to unspecified organism:  -Meets sepsis criteria with leukocytosis and tachycardia  -continue empiric anabiotic  - Continue normal saline 50 ml/hr; watch for fluid overload (patient's EF 20 to 25% and AoCKD-IV, limiting aggressive IVF) - Blood cultures x 2 NGTD  Jaundice (multiple tattoos which appear to be homemade?) -Acute Hepatitis panel negative - RPR negative -anti-nuclear antibody pending -HIV negative  PAD: - Patient had arterial Doppler 2013, which showed Right and left anterior tibial arteries and Left SFA-occluded; Right CIA-<50% diameter reduction; Right Deep Profunda-70-99% diameter reduction; Right SFA->60% diameter reduction; Right Distal Popliteal/Tibial Artery- >60% diameter reduction; Left CFA and Profunda- >50% diameter reduction. -may need to consult to vascular surgeon or follow up with them.  CAD in native artery and s/p of CABG 2010:  - EKG showed old left bundle blockage. -continue Imdur 60 mg daily  -Metoprol 50 mg BID  Chronic Systolic CHF/Cardiomyopathy  -2-D echo on 02/21/14 showed EF 20-25% with grade 3 diastolic dysfunction.  - seen by Dr. Royann Shivers on 08/07/14. Per Dr. Royann Shivers, the decisions regarding cardiac device therapy is depending heavily on his decision regarding dialysis. Subcutaneous ICD was considered if he starts dialysis. -6/3 Echocardiogram; shows cardiomyopathy with worsening EF see results below  -DC Imdur 60 mg until patient able to take PO -DC metoprolol  50 mg until patient able to take PO -Start metoprolol IV 5 mg QID -Start hydralazine 10 mg every4hr  HTN: -See CAD  Hyperlipidemia:  -lipid  panel abnormal  DM type-II controlled:  -Last A1c 10.5 on  03/19/11.,  -6/3 Hemoglobin A1c= 6.4 -ContinueLevemir 3 units  -Sensitive SSI  Acute on chronic renal failure( Previous creatinine was a 5.85 on 03/11/14) -Be very judicious in the use of narcotics and benzodiazepine as patient will not clear them well . -Current Cr= 7. 52 -IVF as above -Avoid ACEI and NSAIDs -Patient to have HD today.  Hypokalemia -Potassium goal>4    Code Status: FULL Family Communication: no family present at time of exam Disposition Plan: Resolution sepsis    Consultants: Dr.James Kerry Kass (nephrology) Dr. Zetta Bills (nephrology)   Procedure/Significant Events: 6/3 echocardiogram;- Left ventricle: The cavity size was mildly dilated. mild LVH.  -LVEF= 20%. Diffuse hypokinesis. - Left atrium:  moderately dilated.- Right ventricle: mildly to moderately dilated.  - Right atrium: The atrium was mildly dilated.   Culture 6/1 blood right arm left forearm NGTD 6/1 urine insignificant growth 6/2 MRSA by PCR negative 6/1 HIV negative 6/3 RPR negative 6/3 hepatitis panel negative   Antibiotics: Vancomycin 6 /1>> Zosyn 6/2>>  DVT prophylaxis: Subcutaneous heparin   Devices    LINES / TUBES:      Continuous Infusions: . sodium chloride 50 mL/hr at 09/28/14 0335    Objective: VITAL SIGNS: Temp: 97.4 F (36.3 C) (06/05 1330) Temp Source: Oral (06/05 1330) BP: 160/97 mmHg (06/05 1330) Pulse Rate: 67 (06/05 1330) SPO2; FIO2:   Intake/Output Summary (Last 24 hours) at 09/28/14 1403 Last data filed at 09/28/14 0700  Gross per 24 hour  Intake   1003 ml  Output      0 ml  Net   1003 ml     Exam: General: A/O 0, jaundiced, cachectic, difficult to arouse (however easier than on 6/3), No acute respiratory distress Eyes: retinal hemorrhage, sluggish reaction pupils to light. ENT: Negative Runny nose, Neck:  Negative scars, masses, torticollis, lymphadenopathy, JVD Lungs: Clear to auscultation bilaterally without wheezes or  crackles Cardiovascular: Regular Rhythm with frequent PVC, and rate without murmur gallop or rub normal S1 and S2 Abdomen:negative abdominal pain, negative dysphagia, nondistended, soft, bowel sounds positive, no rebound, no ascites, no appreciable mass Extremities: Bilateral lower extremity erythema on the right from Hip down, on left knee down, on left thigh petechia (which appears to be resolving) Psychiatric:  Unable to assess  Neurologic:  Unable to assess    Data Reviewed: Basic Metabolic Panel:  Recent Labs Lab 09/24/14 1905 09/25/14 0410 09/26/14 0345 09/27/14 0236 09/28/14 0540  NA 136 135 138 140 143  K 3.8 3.7 3.3* 3.4* 4.2  CL 94* 97* 97* 100* 102  CO2 22 19* 19* 21* 19*  GLUCOSE 129* 115* 75 98 78  BUN 130* 128* 130* 130* 140*  CREATININE 7.95* 7.61* 7.35* 7.35* 7.52*  CALCIUM 9.0 8.2* 8.3* 8.2* 8.3*  MG  --   --  2.4 2.3 2.4   Liver Function Tests:  Recent Labs Lab 09/24/14 1905 09/25/14 0410 09/26/14 0345 09/27/14 0236 09/28/14 0540  AST ALT 15* 13* 13* 11* 12*  ALKPHOS 168* 126 113 99 96  BILITOT 4.4* 3.5* 3.5* 3.2* 3.4*  PROT 7.0 5.9* 6.2* 5.8* 6.0*  ALBUMIN 3.0* 2.4* 2.4* 2.2* 2.2*    Recent Labs Lab 09/26/14 0100  LIPASE 106*   No results for input(s): AMMONIA in the last 168 hours. CBC:  Recent Labs Lab 09/24/14 1905 09/25/14 0410 09/26/14 0345 09/27/14 0236 09/28/14 0540  WBC 13.8* 11.0* 12.4*  11.7* 12.5*  NEUTROABS 13.0*  --  11.6* 10.8* 11.2*  HGB 12.8* 10.8* 11.5* 10.4* 11.3*  HCT 38.0* 31.9* 34.2* 31.4* 34.9*  MCV 86.8 87.4 87.2 87.0 87.7  PLT 167 132* 145* 156 182   Cardiac Enzymes: No results for input(s): CKTOTAL, CKMB, CKMBINDEX, TROPONINI in the last 168 hours. BNP (last 3 results)  Recent Labs  09/25/14 0410  BNP 2227.0*    ProBNP (last 3 results) No results for input(s): PROBNP in the last 8760 hours.  CBG:  Recent Labs Lab 09/27/14 2013 09/27/14 2356 09/28/14 0407 09/28/14 0752  09/28/14 1337  GLUCAP 79 74 81 86 105*    Recent Results (from the past 240 hour(s))  Blood Culture (routine x 2)     Status: None (Preliminary result)   Collection Time: 09/24/14  7:17 PM  Result Value Ref Range Status   Specimen Description BLOOD RIGHT ARM  Final   Special Requests BOTTLES DRAWN AEROBIC AND ANAEROBIC 5CC  Final   Culture   Final           BLOOD CULTURE RECEIVED NO GROWTH TO DATE CULTURE WILL BE HELD FOR 5 DAYS BEFORE ISSUING A FINAL NEGATIVE REPORT Performed at [facility]    Report Status PENDING  Incomplete  Blood Culture (routine x 2)     Status: None (Preliminary result)   Collection Time: 09/24/14  7:17 PM  Result Value Ref Range Status   Specimen Description BLOOD LEFT FOREARM  Final   Special Requests BOTTLES DRAWN AEROBIC AND ANAEROBIC 5CC  Final   Culture   Final           BLOOD CULTURE RECEIVED NO GROWTH TO DATE CULTURE WILL BE HELD FOR 5 DAYS BEFORE ISSUING A FINAL NEGATIVE REPORT Performed at [facility]    Report Status PENDING  Incomplete  Urine culture     Status: None   Collection Time: 09/24/14  7:23 PM  Result Value Ref Range Status   Specimen Description URINE, RANDOM  Final   Special Requests NONE  Final   Colony Count   Final    8,000 COLONIES/ML Performed at [facility]    Culture   Final    INSIGNIFICANT GROWTH Performed at [facility]    Report Status 09/25/2014 FINAL  Final  MRSA PCR Screening     Status: None   Collection Time: 09/25/14  7:00 AM  Result Value Ref Range Status   MRSA by PCR NEGATIVE NEGATIVE Final    Comment:        The GeneXpert MRSA Assay (FDA approved for NASAL specimens only), is one component of a comprehensive MRSA colonization surveillance program. It is not intended to diagnose MRSA infection nor to guide or monitor treatment for MRSA infections.      Studies:  Recent x-ray studies have been reviewed in detail by the Attending Physician  Scheduled  Meds:  Scheduled Meds: . ferrous sulfate  325 mg Oral Q breakfast  . folic acid  1 mg Oral Daily  . heparin  4,000 Units Dialysis Once in dialysis  . insulin aspart  0-9 Units Subcutaneous 6 times per day  . insulin detemir  3 Units Subcutaneous QAC breakfast  . isosorbide mononitrate  60 mg Oral Daily  . LORazepam      . metoprolol  50 mg Oral BID  . multivitamin with minerals  1 tablet Oral Daily  . pantoprazole  40 mg Oral Daily  . piperacillin-tazobactam (ZOSYN)  IV  2.25 g Intravenous 3 times per day  . sodium bicarbonate  1,300 mg Oral BID  . sodium chloride  3 mL Intravenous Q12H  . thiamine  100 mg Oral Daily   Or  . thiamine  100 mg Intravenous Daily  . vancomycin  1,000 mg Intravenous Q48H    Time spent on care of this patient: 40 mins   Teshara Moree, Roselind MessierURTIS J , MD  [facility] Office  [phone] Pager [phone]- [phone]  On-Call/Text Page:      [email or web address]      password [email or web address]  If 7PM-7AM, please contact night-coverage [email or web address] Password [email or web address] 09/28/2014, 2:03 PM   LOS: 4 days   Care during the described time interval was provided by me .  I have reviewed this patient's available data, including medical history, events of note, physical examination, and all test results as part of my evaluation. I have personally reviewed and interpreted all radiology studies.   Carolyne Littlesurtis Linnea Todisco, MD [phone] Pager

## 2014-09-28 NOTE — Progress Notes (Signed)
Patient ID: [name], male   DOB: [birth year], 49 y.o.   MRN: [id number]  [facility] Progress Note    Assessment/ Plan:   1. AKI on CKD IV/V: hemodynamically mediated with intravascular volume depletion following decreased oral intake and recent increase in diuresis- possibly also ATN from sepsis. No evidence of obstruction on renal ultrasound. CCM attempted IJ HD catheter yesterday but unsuccessful- plan for alternate HD catheter site to start HD-- he has progressed to ESRD. Will get vascular surgery to see him for permanent access options 2. History of congestive heart failure/ischemic cardiomyopathy: appears to be compensated at this time, 2D echo shows LVEF 20%- will monitor with HD/UF 3. Anemia: hemoglobin lower and likely reflective of dilution following intravenous fluids-monitor trend, borderline Tsat on oral ferrous sulfate 4. Hypertension: blood pressures currently fairly controlled on hydralazine, metoprolol and isosorbide 5. Cellulitis of legs/sepsis: Status post intravenous fluids and now on empiric antibody therapy with vancomycin and Zosyn. Mental status continues to wax and wane 6. Metabolic acidosis:acute on chronic from AKI on CKD- increase PO bicarbonate dose  Subjective:   Denies any CP/SOB- somnolent   Objective:   BP 165/80 mmHg  Pulse 67  Temp(Src) 97.5 F (36.4 C) (Axillary)  Resp 26  Ht  (1.803 m)  Wt 81.5 kg (179 lb 10.8 oz)  BMI 25.07 kg/m2  SpO2 100%  Intake/Output Summary (Last 24 hours) at 09/28/14 4098 Last data filed at 09/28/14 0700  Gross per 24 hour  Intake   1513 ml  Output    250 ml  Net   1263 ml   Weight change: -1.5 kg (-3 lb 4.9 oz)  Physical Exam: JXB:JYNWGNFAO resting in bed ZHY:QMVHQ RRR, normal s1 and s2, no rub Resp:Coarse BS, no rales/rhonchi  ION:GEXB, flat, NT Ext:RLE cellulitis with skin ulcers  Imaging: Dg Chest Portable 1 View  09/27/2014   CLINICAL DATA:  Central line placement  EXAM: PORTABLE  CHEST - 1 VIEW  COMPARISON:  09/24/2014  FINDINGS: There is a central line from an inferior approach which terminates in the right atrium. Mild pulmonary edema pattern. No pneumothorax.  IMPRESSION: Central venous line from a femoral approach with tip in the right atrium.   Electronically Signed   By: Genevive Bi M.D.   On: 09/27/2014 19:30    Labs: BMET  Recent Labs Lab 09/24/14 1905 09/25/14 0410 09/26/14 0345 09/27/14 0236 09/28/14 0540  NA 136 135 138 140 143  K 3.8 3.7 3.3* 3.4* 4.2  CL 94* 97* 97* 100* 102  CO2 22 19* 19* 21* 19*  GLUCOSE 129* 115* 75 98 78  BUN 130* 128* 130* 130* 140*  CREATININE 7.95* 7.61* 7.35* 7.35* 7.52*  CALCIUM 9.0 8.2* 8.3* 8.2* 8.3*   CBC  Recent Labs Lab 09/24/14 1905 09/25/14 0410 09/26/14 0345 09/27/14 0236 09/28/14 0540  WBC 13.8* 11.0* 12.4* 11.7* 12.5*  NEUTROABS 13.0*  --  11.6* 10.8* 11.2*  HGB 12.8* 10.8* 11.5* 10.4* 11.3*  HCT 38.0* 31.9* 34.2* 31.4* 34.9*  MCV 86.8 87.4 87.2 87.0 87.7  PLT 167 132* 145* 156 182   Medications:    . ferrous sulfate  325 mg Oral Q breakfast  . folic acid  1 mg Oral Daily  . heparin  4,000 Units Dialysis Once in dialysis  . insulin aspart  0-9 Units Subcutaneous 6 times per day  . insulin detemir  3 Units Subcutaneous QAC breakfast  . isosorbide mononitrate  60 mg Oral Daily  . metoprolol  50 mg Oral BID  . multivitamin with minerals  1 tablet Oral Daily  . pantoprazole  40 mg Oral Daily  . piperacillin-tazobactam (ZOSYN)  IV  2.25 g Intravenous 3 times per day  . sodium bicarbonate  1,300 mg Oral BID  . sodium chloride  3 mL Intravenous Q12H  . thiamine  100 mg Oral Daily   Or  . thiamine  100 mg Intravenous Daily  . vancomycin  1,000 mg Intravenous Q48H   Zetta BillsJay Shaneya Taketa, MD 09/28/2014, 9:04 AM

## 2014-09-28 NOTE — Progress Notes (Signed)
Patient has been receiving bites of applesauce along medications throughout night to maintain glucose stabilization. Will continue to monitor.

## 2014-09-29 ENCOUNTER — Inpatient Hospital Stay (HOSPITAL_COMMUNITY): Payer: Medicare Other

## 2014-09-29 DIAGNOSIS — E43 Unspecified severe protein-calorie malnutrition: Secondary | ICD-10-CM | POA: Diagnosis present

## 2014-09-29 DIAGNOSIS — I70261 Atherosclerosis of native arteries of extremities with gangrene, right leg: Secondary | ICD-10-CM

## 2014-09-29 DIAGNOSIS — N185 Chronic kidney disease, stage 5: Secondary | ICD-10-CM

## 2014-09-29 DIAGNOSIS — T82598A Other mechanical complication of other cardiac and vascular devices and implants, initial encounter: Secondary | ICD-10-CM

## 2014-09-29 LAB — COMPREHENSIVE METABOLIC PANEL
ALBUMIN: 2.3 g/dL — AB (ref 3.5–5.0)
ALK PHOS: 90 U/L (ref 38–126)
ALT: 12 U/L — AB (ref 17–63)
ALT: 9 U/L — AB (ref 17–63)
AST: 19 U/L (ref 15–41)
AST: 29 U/L (ref 15–41)
Albumin: 2 g/dL — ABNORMAL LOW (ref 3.5–5.0)
Alkaline Phosphatase: 80 U/L (ref 38–126)
Anion gap: 22 — ABNORMAL HIGH (ref 5–15)
Anion gap: 22 — ABNORMAL HIGH (ref 5–15)
BILIRUBIN TOTAL: 4.2 mg/dL — AB (ref 0.3–1.2)
BUN: 144 mg/dL — AB (ref 6–20)
BUN: 67 mg/dL — ABNORMAL HIGH (ref 6–20)
CALCIUM: 8.1 mg/dL — AB (ref 8.9–10.3)
CHLORIDE: 103 mmol/L (ref 101–111)
CO2: 17 mmol/L — ABNORMAL LOW (ref 22–32)
CO2: 19 mmol/L — AB (ref 22–32)
CREATININE: 7.82 mg/dL — AB (ref 0.61–1.24)
Calcium: 8.2 mg/dL — ABNORMAL LOW (ref 8.9–10.3)
Chloride: 101 mmol/L (ref 101–111)
Creatinine, Ser: 4.65 mg/dL — ABNORMAL HIGH (ref 0.61–1.24)
GFR calc Af Amer: 8 mL/min — ABNORMAL LOW (ref >60)
GFR calc non Af Amer: 7 mL/min — ABNORMAL LOW (ref >60)
GFR, EST AFRICAN AMERICAN: 16 mL/min — AB (ref >60)
GFR, EST NON AFRICAN AMERICAN: 13 mL/min — AB (ref >60)
Glucose, Bld: 109 mg/dL — ABNORMAL HIGH (ref 65–99)
Glucose, Bld: 162 mg/dL — ABNORMAL HIGH (ref 65–99)
POTASSIUM: 3.8 mmol/L (ref 3.5–5.1)
Potassium: 3.9 mmol/L (ref 3.5–5.1)
Sodium: 140 mmol/L (ref 135–145)
Sodium: 144 mmol/L (ref 135–145)
TOTAL PROTEIN: 6.2 g/dL — AB (ref 6.5–8.1)
Total Bilirubin: 3.4 mg/dL — ABNORMAL HIGH (ref 0.3–1.2)
Total Protein: 5.6 g/dL — ABNORMAL LOW (ref 6.5–8.1)

## 2014-09-29 LAB — CBC WITH DIFFERENTIAL/PLATELET
BASOS ABS: 0 10*3/uL (ref 0.0–0.1)
BASOS PCT: 0 % (ref 0–1)
EOS PCT: 0 % (ref 0–5)
Eosinophils Absolute: 0 10*3/uL (ref 0.0–0.7)
HEMATOCRIT: 33.5 % — AB (ref 39.0–52.0)
HEMOGLOBIN: 10.8 g/dL — AB (ref 13.0–17.0)
LYMPHS ABS: 0.8 10*3/uL (ref 0.7–4.0)
Lymphocytes Relative: 6 % — ABNORMAL LOW (ref 12–46)
MCH: 28.6 pg (ref 26.0–34.0)
MCHC: 32.2 g/dL (ref 30.0–36.0)
MCV: 88.6 fL (ref 78.0–100.0)
MONO ABS: 0.2 10*3/uL (ref 0.1–1.0)
MONOS PCT: 2 % — AB (ref 3–12)
NEUTROS PCT: 92 % — AB (ref 43–77)
Neutro Abs: 11.2 10*3/uL — ABNORMAL HIGH (ref 1.7–7.7)
Platelets: 172 10*3/uL (ref 150–400)
RBC: 3.78 MIL/uL — AB (ref 4.22–5.81)
RDW: 15.2 % (ref 11.5–15.5)
WBC: 12.2 10*3/uL — AB (ref 4.0–10.5)

## 2014-09-29 LAB — GLUCOSE, CAPILLARY
GLUCOSE-CAPILLARY: 114 mg/dL — AB (ref 65–99)
GLUCOSE-CAPILLARY: 135 mg/dL — AB (ref 65–99)
GLUCOSE-CAPILLARY: 142 mg/dL — AB (ref 65–99)
Glucose-Capillary: 117 mg/dL — ABNORMAL HIGH (ref 65–99)
Glucose-Capillary: 122 mg/dL — ABNORMAL HIGH (ref 65–99)
Glucose-Capillary: 134 mg/dL — ABNORMAL HIGH (ref 65–99)

## 2014-09-29 LAB — FERRITIN: Ferritin: 279 ng/mL (ref 24–336)

## 2014-09-29 LAB — IRON AND TIBC
Iron: 24 ug/dL — ABNORMAL LOW (ref 45–182)
SATURATION RATIOS: 16 % — AB (ref 17.9–39.5)
TIBC: 154 ug/dL — AB (ref 250–450)
UIBC: 130 ug/dL

## 2014-09-29 LAB — EXTRACTABLE NUCLEAR ANTIGEN ANTIBODY
ENA SM AB SER-ACNC: NEGATIVE
SSA (Ro) (ENA) Antibody, IgG: <1
SSB (La) (ENA) Antibody, IgG: <1
Scleroderma (Scl-70) (ENA) Antibody, IgG: <1
Sm/rnp: <1
ds DNA Ab: <1 IU/mL

## 2014-09-29 LAB — MAGNESIUM
MAGNESIUM: 2.2 mg/dL (ref 1.7–2.4)
Magnesium: 2.5 mg/dL — ABNORMAL HIGH (ref 1.7–2.4)

## 2014-09-29 LAB — PHOSPHORUS: Phosphorus: 7.4 mg/dL — ABNORMAL HIGH (ref 2.5–4.6)

## 2014-09-29 LAB — TROPONIN I: Troponin I: 0.07 ng/mL — ABNORMAL HIGH (ref <0.031)

## 2014-09-29 MED ORDER — DILTIAZEM HCL 100 MG IV SOLR
5.0000 mg/h | INTRAVENOUS | Status: DC
  Filled 2014-09-29: qty 100

## 2014-09-29 MED ORDER — CHLORHEXIDINE GLUCONATE 0.12 % MT SOLN
15.0000 mL | Freq: Two times a day (BID) | OROMUCOSAL | Status: DC
  Administered 2014-09-29 – 2014-10-01 (×5): 15 mL via OROMUCOSAL
  Filled 2014-09-29 (×4): qty 15

## 2014-09-29 MED ORDER — AMIODARONE HCL IN DEXTROSE 360-4.14 MG/200ML-% IV SOLN
60.0000 mg/h | INTRAVENOUS | Status: AC
  Administered 2014-09-30 – 2014-10-04 (×7): 30 mg/h via INTRAVENOUS
  Filled 2014-09-29 (×20): qty 200

## 2014-09-29 MED ORDER — DILTIAZEM LOAD VIA INFUSION
10.0000 mg | Freq: Once | INTRAVENOUS | Status: DC
  Filled 2014-09-29: qty 10

## 2014-09-29 MED ORDER — HEPARIN SODIUM (PORCINE) 5000 UNIT/ML IJ SOLN
5000.0000 [IU] | Freq: Three times a day (TID) | INTRAMUSCULAR | Status: DC
  Administered 2014-09-29: 5000 [IU] via SUBCUTANEOUS

## 2014-09-29 MED ORDER — AMIODARONE LOAD VIA INFUSION
150.0000 mg | Freq: Once | INTRAVENOUS | Status: AC
  Administered 2014-09-29: 150 mg via INTRAVENOUS
  Filled 2014-09-29: qty 83.34

## 2014-09-29 MED ORDER — ONDANSETRON HCL 4 MG/2ML IJ SOLN: 4.0000 mg | Freq: Four times a day (QID) | INTRAMUSCULAR | Status: DC | PRN

## 2014-09-29 MED ORDER — FOLIC ACID 5 MG/ML IJ SOLN
1.0000 mg | Freq: Every day | INTRAMUSCULAR | Status: DC
  Administered 2014-09-29 – 2014-10-03 (×4): 1 mg via INTRAVENOUS
  Filled 2014-09-29 (×5): qty 0.2

## 2014-09-29 MED ORDER — AMIODARONE HCL IN DEXTROSE 360-4.14 MG/200ML-% IV SOLN
60.0000 mg/h | INTRAVENOUS | Status: AC
  Administered 2014-09-29 – 2014-09-30 (×2): 60 mg/h via INTRAVENOUS
  Filled 2014-09-29: qty 200

## 2014-09-29 MED ORDER — AMIODARONE HCL IN DEXTROSE 360-4.14 MG/200ML-% IV SOLN
INTRAVENOUS | Status: AC
  Administered 2014-09-29: 200 mL
  Filled 2014-09-29: qty 200

## 2014-09-29 MED ORDER — PANTOPRAZOLE SODIUM 40 MG IV SOLR
40.0000 mg | INTRAVENOUS | Status: DC
  Administered 2014-09-29 – 2014-10-03 (×4): 40 mg via INTRAVENOUS
  Filled 2014-09-29 (×5): qty 40

## 2014-09-29 MED ORDER — CETYLPYRIDINIUM CHLORIDE 0.05 % MT LIQD
7.0000 mL | Freq: Two times a day (BID) | OROMUCOSAL | Status: DC
  Administered 2014-09-29 – 2014-10-01 (×5): 7 mL via OROMUCOSAL

## 2014-09-29 MED ORDER — LORAZEPAM 2 MG/ML IJ SOLN
1.0000 mg | Freq: Once | INTRAMUSCULAR | Status: AC
  Administered 2014-09-30: 1 mg via INTRAVENOUS

## 2014-09-29 MED ORDER — VANCOMYCIN HCL IN DEXTROSE 1-5 GM/200ML-% IV SOLN
1000.0000 mg | INTRAVENOUS | Status: DC | PRN
  Administered 2014-09-29: 1000 mg via INTRAVENOUS
  Filled 2014-09-29 (×4): qty 200

## 2014-09-29 NOTE — Progress Notes (Signed)
ANTIBIOTIC CONSULT NOTE - FOLLOW UP  Pharmacy Consult for Vancomycin and Zosyn Indication: cellulitis  No Known Allergies  Patient Measurements: Height: 5\' 11"  (180.3 cm) (family reports at the bedside. ) Weight: 179 lb 14.3 oz (81.6 kg) IBW/kg (Calculated) : 75.3  Vital Signs: Temp: 98 F (36.7 C) (06/06 0744) Temp Source: Axillary (06/06 0744) BP: 175/90 mmHg (06/06 0800) Pulse Rate: 63 (06/06 0142) Intake/Output from previous day: 06/05 0701 - 06/06 0700 In: -  Out: 1300 [Urine:800] Intake/Output from this shift: Total I/O In: 1250 [I.V.:1250] Out: 60 [Urine:60]  Labs:  Recent Labs  09/27/14 0236 09/28/14 0540 09/29/14 0013  WBC 11.7* 12.5* 12.2*  HGB 10.4* 11.3* 10.8*  PLT 156 182 172  CREATININE 7.35* 7.52* 7.82*   Estimated Creatinine Clearance: 12 mL/min (by C-G formula based on Cr of 7.82).  Assessment: 49yom continues on day # 6 vancomycin and zosyn for bilateral LE cellulitis/sepsis. Renal on board for AKI on CKD IV/V and planning for HD today and again tomorrow.  Vancomycin doses as follows: *1750mg  total on 6/1 *1000mg  on 6/3 *1000mg  on 6/6  With increasing sCr (up to 7.82) and minimal UOP, will not give any more vancomycin doses prior to HD, he has been sufficiently loaded. Zosyn dose remains appropriate.   Vancomycin 6/1>> Zosyn 6/1>>  6/1 blood x2- ngtd 6/1 urine- insignif growth  Goal of Therapy:  pre HD vancomycin level 15-25  Plan:  1) Continue zosyn 2.25g IV q8 2) Change vancomycin to 1g qHD 3) Follow up HD schedule, tolerance  Fredrik RiggerMarkle, Kameah Rawl Sue 09/29/2014,10:16 AM

## 2014-09-29 NOTE — Progress Notes (Signed)
Shift event: Earlier, RN Alinda MoneyMelvin, paged this NP because pt converted to Afib with RVR with rate 125 on 12 lead-faster on tele. Ordered Cardizem drip. Before Cardizem could be started, pt went into VT with HR over 200. Dr. Tresa EndoKelly of cardio just happened to be on the unit and came in as code team was administering defibrillation. See cardiology note.  PCCM called and pt to transfer to their service and moved to ICU.  When this NP arrived, pt was back in Afib now on amiodarone drip and Cardizem order cancelled. Multi stat labs ordered. Pt barely responsive on my exam and was unable to verbalize any history.  Jimmye NormanKaren Kirby-Graham, NP [facility]

## 2014-09-29 NOTE — Consult Note (Signed)
PULMONARY / CRITICAL CARE MEDICINE   Name: [name] MRN: [id number] DOB: February 06, 1966    ADMISSION DATE:  09/24/2014 CONSULTATION DATE:  6/6  REFERRING MD : Dr. Thereasa Solo  CHIEF COMPLAINT:  VT  INITIAL PRESENTATION: 49 year old male with multiple medical problems was admitted for presumed sepsis 2nd to lower extremity cellulitis with associated acute on CKD. Encephalopathy persisted and CKD worsened to the pont he was started on HD 6/5. 6/6 He developed AF RVR which converted to VT, was cardioverted successfully and was transferred to ICU. PCCM consulted.  STUDIES:  Renal US 6/2 > small kidneys consistent with chronic medical renal disease, no obstruction.  Moderate volume of intra-abdominal and pelvic ascites. Echo 6/3 > LVEF 20% (Grade 3 DD reported on prior study as well)  SIGNIFICANT EVENTS: 6/1 admitted for sepsis 6/5 started HD 6/6 VT > cardiovert, to ICU  HISTORY OF PRESENT ILLNESS:  49 year old male with PMH as below, which is significant for uncontrolled DM, systolic/diastolic CHF, CKD IV, Severe PAD, CAD s/p CABG, and EtOH abuse. He presented to [facility] ED 6/1 complaining of R leg pain and swelling x several days. Foot was red from foot to hip medially at time of presentation. He met criteria for sepsis and was admitted for broad spectrum antibiotics. He was hemodynamically stable and responded well to IVF resuscitation. Also with acute on chronic kidney injury. Course mostly complicated by worsening renal function and persistent encephalopathy. HD started 6/5. 6/6 late PM he was noted to be in VT with HR in low 200's. He was cardioverted successfully with 120 joules and bolused/started on amiodarone gtt. He was transferred to ICU and PCCM to primary. Of note he has refused life saving therapies in the past such as HD and ICD placement.  PAST MEDICAL HISTORY :   has a past medical history of Coronary artery disease (07/05/2011); Hypertension; CHF (congestive heart failure); Diabetes  mellitus; Cardiomyopathy; Chronic kidney disease (CKD), stage IV (severe); Claudication (05/09/2011); S/P CABG (coronary artery bypass graft) (03/23/2011`); and GERD (gastroesophageal reflux disease).  has past surgical history that includes Cardiac catheterization (05/07/2008) and Coronary artery bypass graft (06/2008). Prior to Admission medications   Medication Sig Start Date End Date Taking? Authorizing Provider  calcium carbonate (TUMS EX) 750 MG chewable tablet Chew 1 tablet by mouth as needed for heartburn.   Yes Historical Provider, MD  Ferrous Sulfate (IRON) 325 (65 FE) MG TABS Take 325 mg by mouth daily.   Yes Historical Provider, MD  furosemide (LASIX) 80 MG tablet Take 1 tablet (80 mg total) by mouth daily. 02/10/14  Yes Mihai Croitoru, MD  hydrALAZINE (APRESOLINE) 50 MG tablet Take 1 tablet (50 mg total) by mouth every 8 (eight) hours. 11/07/13  Yes Mihai Croitoru, MD  isosorbide mononitrate (IMDUR) 60 MG 24 hr tablet Take 1 tablet (60 mg total) by mouth daily. 03/17/14  Yes Mihai Croitoru, MD  LEVEMIR 100 UNIT/ML injection Inject 5 Units into the skin daily before breakfast. 05/12/14  Yes Historical Provider, MD  metoprolol (LOPRESSOR) 50 MG tablet Take 1 tablet (50 mg total) by mouth 2 (two) times daily. 03/13/13  Yes Mihai Croitoru, MD  Oxycodone HCl 10 MG TABS Take 1 tablet by mouth every 6 (six) hours as needed. For pain 07/16/14  Yes Historical Provider, MD  pantoprazole (PROTONIX) 40 MG tablet Take 40 mg by mouth daily.  05/08/13  Yes Historical Provider, MD  sodium bicarbonate 650 MG tablet Take 650 mg by mouth 2 (two) times daily.  Yes Historical Provider, MD  acyclovir (ZOVIRAX) 800 MG tablet Take 1 tablet (800 mg total) by mouth 5 (five) times daily. Patient not taking: Reported on 09/24/2014 03/11/14   Orpah Greek, MD  nitroGLYCERIN (NITROSTAT) 0.4 MG SL tablet Place 1 tablet (0.4 mg total) under the tongue once. Patient taking differently: Place 0.4 mg under the tongue  every 5 (five) minutes as needed for chest pain.  10/01/13   Mihai Croitoru, MD   No Known Allergies  FAMILY HISTORY:  has no family status information on file.  SOCIAL HISTORY:  reports that he quit smoking about 12 months ago. His smoking use included Cigarettes. He has a 12.5 pack-year smoking history. He does not have any smokeless tobacco history on file. He reports that he drinks alcohol. He reports that he uses illicit drugs (Marijuana).  REVIEW OF SYSTEMS:  Unable due to confusion   SUBJECTIVE:   VITAL SIGNS: Temp:  [97.3 F (36.3 C)-98 F (36.7 C)] 97.4 F (36.3 C) (06/06 2317) Pulse Rate:  [62-98] 95 (06/06 2200) Resp:  [12-35] 25 (06/06 2200) BP: (108-203)/(70-114) 176/85 mmHg (06/06 2200) SpO2:  [99 %-100 %] 100 % (06/06 2200) Weight:  [79.6 kg (175 lb 7.8 oz)-82 kg (180 lb 12.4 oz)] 79.6 kg (175 lb 7.8 oz) (06/06 1755) HEMODYNAMICS:   VENTILATOR SETTINGS:   INTAKE / OUTPUT:  Intake/Output Summary (Last 24 hours) at 09/29/14 2329 Last data filed at 09/29/14 2200  Gross per 24 hour  Intake   2450 ml  Output   4025 ml  Net  -1575 ml    PHYSICAL EXAMINATION: General:  Thin male, mildly agitated Neuro:  Confused. Alert and oriented to self only. Minimally follows commands.  HEENT:  [place]/AT, no JVD, PERRL, jaundiced sclera  Cardiovascular:  Irreg irreg, tachy, no MRG Lungs:  Clear bilateral breath sounds Abdomen:  Soft, fluctuant ascites, hypoactive BS, nontender Musculoskeletal:  No acute deformity Skin:  Redness and multiple areas on skin breakdown to RLE  LABS:  CBC  Recent Labs Lab 09/27/14 0236 09/28/14 0540 09/29/14 0013  WBC 11.7* 12.5* 12.2*  HGB 10.4* 11.3* 10.8*  HCT 31.4* 34.9* 33.5*  PLT 156 182 172   Coag's  Recent Labs Lab 09/24/14 1905 09/27/14 1120  APTT 39*  --   INR 1.39 1.45   BMET  Recent Labs Lab 09/27/14 0236 09/28/14 0540 09/29/14 0013  NA 140 143 144  K 3.4* 4.2 3.9  CL 100* 102 103  CO2 21* 19* 19*  BUN 130*  140* 144*  CREATININE 7.35* 7.52* 7.82*  GLUCOSE 98 78 109*   Electrolytes  Recent Labs Lab 09/27/14 0236 09/28/14 0540 09/29/14 0013 09/29/14 1036  CALCIUM 8.2* 8.3* 8.1*  --   MG 2.3 2.4 2.5*  --   PHOS  --   --   --  7.4*   Sepsis Markers  Recent Labs Lab 09/24/14 2330 09/25/14 0410 09/27/14 0236  LATICACIDVEN 1.6 1.2 1.2  PROCALCITON 3.32  --   --    ABG No results for input(s): PHART, PCO2ART, PO2ART in the last 168 hours. Liver Enzymes  Recent Labs Lab 09/27/14 0236 09/28/14 0540 09/29/14 0013  AST 17 25 19   ALT 11* 12* 9*  ALKPHOS 99 96 80  BILITOT 3.2* 3.4* 3.4*  ALBUMIN 2.2* 2.2* 2.0*   Cardiac Enzymes No results for input(s): TROPONINI, PROBNP in the last 168 hours. Glucose  Recent Labs Lab 09/28/14 2008 09/28/14 2339 09/29/14 0503 09/29/14 0741 09/29/14 1110 09/29/14 2003  GLUCAP 98 117* 134* 114* 122* 135*    Imaging No results found.   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 Pancreatitis Jaundice, no elevation in LFT Bilirubinemia  Hepatitis panel negative RPR negative ANA pending >> HIV negative  P:    NPO Consider TNA Protonix Consider CT abdomen Check ammonia See ID section  HEMATOLOGIC A:   Anemia of chronic illness  P:  Follow CBC Transfuse per ICU protocol Coags normal  INFECTIOUS A:   Sepsis secondary to cellulitis ? Pancreatitis  P:   BCx2 6/1 >>> UC 6/1 neg Abx: Zosyn, start date 6/2 >>> Abx: Vancomycin, start date 6/2 >>> Follow WBC and fever curve PCT algorithm  ENDOCRINE A:   DM (A1C 6.4)  P:   CBG and SSI q 4 hours TSH Cortisol  NEUROLOGIC A:   Acute metabolic encephalopathy, cause unclear. Uremia vs hepatic vs acidosis related.  EtOH abuse, concern withdrawal  P:   RASS goal: 0 Holding CIWA due to oversedation Checking ammonia, ABG, serum and urine osm  FAMILY  - Updates: Family updated by staff RN about VT episode and ICU transfer  - Inter-disciplinary family meet or Palliative Care meeting due by:  6/13  Merton Border, MD ; [facility] service Mobile [phone].  After 5:30 PM or weekends, call [phone]   09/30/2014 12:48 AM

## 2014-09-29 NOTE — Consult Note (Signed)
[facility] Consult  Reason for Consult:  New access Referring Physician:  Dr. Patel MRN #:  [id number]  History of Present Illness: This is a 49 y.o. male who we've been consulted on regarding permanent access. The patient is difficult to arouse and unable to provide a history. He is intermittently moaning. He does respond that he is right handed. The remaining history is gathered from the chart. Per RN, his mother who was present is unable to provide much information about his history.  He presented to the [place] ED on 09/24/14 with right leg swelling and pain for the previous several days. He had difficulty walking over the past 2 days secondary to the pain.   He has a PMH of CAD s/p CABG in 2010, diabetes mellitus 2, CHF, HTN and CKD stage IV.   Past Medical History  Diagnosis Date  . Coronary artery disease 07/05/2011    2D ECHO - EF ~40%, moderate concentric LV hypertrophy, LA moderately dilated, mild-moderate septal and inferior wall hypokinesis  . Hypertension   . CHF (congestive heart failure)   . Diabetes mellitus     Insulin dependent  . Cardiomyopathy   . Chronic kidney disease (CKD), stage IV (severe)   . Claudication 05/09/2011    Right and left anterior tibial arteries and Left SFA-occluded; Right CIA-<50% diameter reduction; Right Deep Profunda-70-99% diameter reduction; Right SFA->60% diameter reduction; Right Distal Popliteal/Tibial Artery- >60% diameter reduction; Left CFA and Profunda- >50% diameter reduction  . 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 GERD (gastroesophageal reflux disease)    Past Surgical History  Procedure Laterality Date  . Cardiac catheterization  05/07/2008    CABG  . Coronary artery bypass graft  06/2008    x3    No Known  Allergies  Prior to Admission medications   Medication Sig Start Date End Date Taking? Authorizing Provider  calcium carbonate (TUMS EX) 750 MG chewable tablet Chew 1 tablet by mouth as needed for heartburn.   Yes Historical Provider, MD  Ferrous Sulfate (IRON) 325 (65 FE) MG TABS Take 325 mg by mouth daily.   Yes Historical Provider, MD  furosemide (LASIX) 80 MG tablet Take 1 tablet (80 mg total) by mouth daily. 02/10/14  Yes Mihai Croitoru, MD  hydrALAZINE (APRESOLINE) 50 MG tablet Take 1 tablet (50 mg total) by mouth every 8 (eight) hours. 11/07/13  Yes Mihai Croitoru, MD  isosorbide mononitrate (IMDUR) 60 MG 24 hr tablet Take 1 tablet (60 mg total) by mouth daily. 03/17/14  Yes Mihai Croitoru, MD  LEVEMIR 100 UNIT/ML injection Inject 5 Units into the skin daily before breakfast. 05/12/14  Yes Historical Provider, MD  metoprolol (LOPRESSOR) 50 MG tablet Take 1 tablet (50 mg total) by mouth 2 (two) times daily. 03/13/13  Yes Mihai Croitoru, MD  Oxycodone HCl 10 MG TABS Take 1 tablet by mouth every 6 (six) hours as needed. For pain 07/16/14  Yes Historical Provider, MD  pantoprazole (PROTONIX) 40 MG tablet Take 40 mg by mouth daily.  05/08/13  Yes Historical Provider, MD  sodium bicarbonate 650 MG tablet Take 650 mg by mouth 2 (two) times daily.   Yes Historical Provider, MD  acyclovir (ZOVIRAX) 800 MG tablet Take 1 tablet (800 mg total) by mouth 5 (five) times daily. Patient not taking:   Reported on 09/24/2014 03/11/14   Christopher J Pollina, MD  nitroGLYCERIN (NITROSTAT) 0.4 MG SL tablet Place 1 tablet (0.4 mg total) under the tongue once. Patient taking differently: Place 0.4 mg under the tongue every 5 (five) minutes as needed for chest pain.  10/01/13   Mihai Croitoru, MD    History   Social History  . Marital Status: Single    Spouse Name: N/A  . Number of Children: N/A  . Years of Education: N/A   Occupational History  . Not on file.   Social History Main Topics  . Smoking status:  Former Smoker -- 0.50 packs/day for 25 years    Types: Cigarettes    Quit date: 09/03/2013  . Smokeless tobacco: Not on file  . Alcohol Use: Yes     Comment: occas.  . Drug Use: Yes    Special: Marijuana  . Sexual Activity: Yes   Other Topics Concern  . Not on file   Social History Narrative    No family history on file.  ROS: [x] Positive   [ ] Negative   [ ] All sytems reviewed and are negative Patient unable to provide ROS.   Cardiovascular: [] chest pain/pressure [] palpitations [] SOB lying flat [] DOE [] pain in legs while walking [] pain in legs at rest [] pain in legs at night [] non-healing ulcers [] hx of DVT [] swelling in legs  Pulmonary: [] productive cough [] asthma/wheezing [] home O2  Neurologic: [] weakness in [] arms [] legs [] numbness in [] arms [] legs [] hx of CVA [] mini stroke []difficulty speaking or slurred speech [] temporary loss of vision in one eye [] dizziness  Hematologic: [] hx of cancer [] bleeding problems [] problems with blood clotting easily  Endocrine:   [] diabetes [] thyroid disease  GI [] vomiting blood [] blood in stool  GU: [x] CKD/renal failure [] HD--[] M/W/F or [] T/T/S [] burning with urination [] blood in urine  Psychiatric: [] anxiety [] depression  Musculoskeletal: [] arthritis [] joint pain  Integumentary: [] rashes [] ulcers  Constitutional: [] fever [] chills   Physical Examination  Filed Vitals:   09/29/14 1100  BP: 145/76  Pulse:   Temp: 97.9 F (36.6 C)  Resp: 17   Body mass index is 25.1 kg/(m^2).  General:  AxO x 0, ill appearing male, not following commands, is moaning, randomly answers some questions. Gait: Not observed HENT: WNL, normocephalic Pulmonary: normal non-labored breathing, without Rales, rhonchi,  wheezing Cardiac: regular, without  Murmurs, rubs or gallops Abdomen: soft, NT/ND  Vascular Exam/Pulses: Non palpable radial and ulnar pulses. Palpable  left brachial pulse. 2+ right femoral pulse. Left femoral temporary catheter in place. Left monophasic peroneal doppler signal, dampened left DP, monophasic-biphasic left PT, right PT brisk biphasic. Non dopplerable right peroneal and dorsalis pedis.  Extremities: right leg cellulitis with superficial ulcerations and dry black eschar to anterior shin and dorsum of left foot. No drainage. Small dry gangrenous ulcer to right lateral distal foot edge. Left leg with large superficial ulcer on anterior shin.  Musculoskeletal: no muscle wasting or atrophy  Neurologic: A&O X 0; Appropriate Affect ; SENSATION: normal  CBC    Component Value Date/Time   WBC 12.2* 09/29/2014 0013   RBC 3.78* 09/29/2014 0013   HGB 10.8* 09/29/2014 0013   HCT 33.5* 09/29/2014 0013   PLT 172 09/29/2014 0013   MCV 88.6 09/29/2014 0013   MCH 28.6   09/29/2014 0013   MCHC 32.2 09/29/2014 0013   RDW 15.2 09/29/2014 0013   LYMPHSABS 0.8 09/29/2014 0013   MONOABS 0.2 09/29/2014 0013   EOSABS 0.0 09/29/2014 0013   BASOSABS 0.0 09/29/2014 0013    BMET    Component Value Date/Time   NA 144 09/29/2014 0013   K 3.9 09/29/2014 0013   CL 103 09/29/2014 0013   CO2 19* 09/29/2014 0013   GLUCOSE 109* 09/29/2014 0013   BUN 144* 09/29/2014 0013   CREATININE 7.82* 09/29/2014 0013   CALCIUM 8.1* 09/29/2014 0013   GFRNONAA 7* 09/29/2014 0013   GFRAA 8* 09/29/2014 0013    COAGS: Lab Results  Component Value Date   INR 1.45 09/27/2014   INR 1.39 09/24/2014   INR 0.91 03/19/2011    Statin:  No. Beta Blocker:  Yes.   Aspirin:  No. ACEI:  No. ARB:  No. Other antiplatelets/anticoagulants:  No.   ASSESSMENT: This is a 49 y.o. male with new ESRD now on HD. Other active problems: Bilateral lower leg cellulitis, PAD,  metabolic encephalopathy multifactorial: uremia, worsening cardiac function, sedating medications, sepsis, chronic systolic CHF, HTN.   PLAN: The patient is right handed. Vein mapping showing small left  cephalic and marginal basilic vein conduits. Right arm with IV in antecubital fossa and marginal cephalic and basilic vein conduits. Will defer choice of access conduit and timing to Dr. Fields. Patient currently dialyzing with left femoral temp cath. He has bilateral superficial leg ulcers and cellulitis, right worse than left. He has a history of PAD. He does have left peroneal, DP and PT doppler signals and right PT doppler signal. No current plans for workup at this point. Blood cultures NGTD. Continue empiric antibiotics.   Kimberly Trinh, PA-C [facility] Office: [phone] Pager: [phone]  Agree with above.  2+ brachial pulses no radial pulse. Leg ulcers with abnormal pulse exam  Will check ABIs Pt needs to have clearer mental status before considering access.  Will follow up with him again in a few days.  Charles Fields, MD [facility] [place] Office: [phone] Pager: [phone]  

## 2014-09-29 NOTE — Progress Notes (Signed)
Brief Note  Code [employer] Called. Stopped by [id number] and Mr. [name] was in monomorphic VT at 210s. Pads were being connected. We delivered 120J synchronized defibrillation. Resulting rhythm SVT 120s-150s then he degenerated into atrial fibrillation. Amiodarone 150 mg IV bolus and gtt also initiated. He has multiple medical problems including T2DM, CHF with last known EF ~ 20%, CAD s/p previous CABG '2010, HTN and CKD stage IV along with ESRD with plans to initiate dialysis. CBC, BMP, Mg ordered. AM labs already reviewed. Defer further management currently to primary team. Vitals reviewed; BP 176/85 mmHg  Pulse 95  Temp(Src) 97.5 F (36.4 C) (Oral)  Resp 25  Ht 5\' 11"  (1.803 m)  Wt 79.6 kg (175 lb 7.8 oz)  BMI 24.49 kg/m2  SpO2 100%   Leeann MustJacob Palmer Fahrner, MD

## 2014-09-29 NOTE — Progress Notes (Signed)
CSW spoke with patient's mother Lambert ModyDelores Harper today who deferred the conversation to her daughter Dory HornSherri [phone].  She appeared to be very apprehensive of CSW at beginning of the conversation stating "I told the other Social Worker- I'm in charge but I"m not making ANY decisions for my brother. He will have to tell me what he wants to do."  CSW discussed role of the Child psychotherapistocial Worker and that SNF rehab was recommended for patient. She stated "That is the stupidest thing I've ever heard. He can't walk, he's not responsive nor can he do anything for himself- why would you even talk about him getting rehab?"  CSW explained that he has actually responded to Physical Therapist and that it is very important that he receive therapy to try to get him up and moving.  After extensive discussion, patient's sister appeared to be somewhat calmer and expressed that she would not be making any decisions about his aftercare - that he would have to tell her what he wanted or did not want. She reaffirmed however, that she and their mother works full time and that there is no one at home to provide home care and acknowledged the benefits of short term SNF. She agreed that should patient not improve enough mentally to be able to make decisions re: his aftercare- only then would she step forward to make the decisions. "I don't want him angry at me for going against his will."  CSW discussed with his sister that once medical stability has been determined- a decision needs to be in place as he will not be able to remain in the hospital at that time.  She stated "well- you do what you need to do but I've told you what I will and will not do."  CSW offered additional support and stated that weekday CSW would be monitoring her brother's progress and would follow up on d/c planning issues.  Lorri Frederickonna T. Jaci LazierCrowder, KentuckyLCSW [phone]

## 2014-09-29 NOTE — Progress Notes (Addendum)
VASCULAR LAB PRELIMINARY  PRELIMINARY  PRELIMINARY  PRELIMINARY  Right  Upper Extremity Vein Map    Cephalic  Segment Diameter Depth Comment  1. Axilla 1.9 mm mm   2. Mid upper arm 2.0 mm mm   3. Above AC 3.3 mm mm   4. In AC 3.6 mm mm Branch   5. Below AC mm mm IV  6. Mid forearm 2.7 mm mm Branch   7. Wrist mm mm    mm mm    mm mm    mm mm    Basilic  Segment Diameter Depth Comment  1. Axilla mm mm   2. Mid upper arm 2.8 mm 9 mm   3. Above AC 3.2 mm 7.5 mm Branch   4. In AC 3.2 mm 5.1 mm Branch   5. Below AC mm mm IV  6. Mid forearm mm mm   7. Wrist mm mm    mm mm    mm mm    mm mm      Left Upper Extremity Vein Map    Cephalic  Segment Diameter Depth Comment  1. Axilla 1.4 mm mm   2. Mid upper arm 1.5 mm mm   3. Above AC 2.4 mm mm   4. In AC 1.7 mm mm Branch   5. Below AC 2.7 mm mm   6. Mid forearm 2.3 mm mm   7. Wrist mm mm IV   mm mm    mm mm    mm mm    Basilic  Segment Diameter Depth Comment  1. Axilla 2.9 mm 18 mm   2. Mid upper arm 2.6 mm 15 mm Branch   3. Above AC 2.4 mm 4.3 mm Branch   4. In AC mm mm   5. Below AC mm mm   6. Mid forearm mm mm   7. Wrist mm mm    mm mm    mm mm    mm mm     Alesia BandaBiggs, Mandisa Persinger, RVT 09/29/2014, 10:59 AM

## 2014-09-29 NOTE — Evaluation (Signed)
Clinical/Bedside Swallow Evaluation Patient Details  Name: [name] MRN: [id number] Date of Birth: Mar 27, 1966  Today's Date: 09/29/2014 Time: SLP Start Time (ACUTE ONLY): 1418 SLP Stop Time (ACUTE ONLY): 1431 SLP Time Calculation (min) (ACUTE ONLY): 13 min  Past Medical History:  Past Medical History  Diagnosis Date  . Coronary artery disease 07/05/2011    2D ECHO - EF ~40%, moderate concentric LV hypertrophy, LA moderately dilated, mild-moderate septal and inferior wall hypokinesis  . Hypertension   . CHF (congestive heart failure)   . Diabetes mellitus     Insulin dependent  . Cardiomyopathy   . Chronic kidney disease (CKD), stage IV (severe)   . Claudication 05/09/2011    Right and left anterior tibial arteries and Left SFA-occluded; Right CIA-<50% diameter reduction; Right Deep Profunda-70-99% diameter reduction; Right SFA->60% diameter reduction; Right Distal Popliteal/Tibial Artery- >60% diameter reduction; Left CFA and Profunda- >50% diameter reduction  . 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 GERD (gastroesophageal reflux disease)    Past Surgical History:  Past Surgical History  Procedure Laterality Date  . Cardiac catheterization  05/07/2008    CABG  . Coronary artery bypass graft  06/2008    x3   HPI:  49 y.o. male with PMH of hypertension, hyperlipidemia, diabetes mellitus, GERD, CAD(s/P of CABG 2010), combined systolic and diastolic congestive heart failure, chronic kidney disease-stage IV, severe PAD, substance abuse, who presents with right leg swelling and pain. CXR Stable mild cardiomegaly. Low lung volumes without acute findings. Found to have metabolic encephalopathy multifactorial to include uremia, worsening cardiac function, iatrogenic (sedating medication), bilateral lower leg  cellulitis and sepsis.   Assessment / Plan / Recommendation Clinical Impression  Pt lethargic/groggy without overt s/s aspiration with thin and puree however pt's current confusion and lethargy are not conducive for safe swallow at present. Continue NPO and oral care. ST will return next date.    Aspiration Risk   (mod-severe)    Diet Recommendation NPO   Medication Administration: Via alternative means    Other  Recommendations Oral Care Recommendations: Oral care BID   Follow Up Recommendations       Frequency and Duration min 2x/week  2 weeks   Pertinent Vitals/Pain No indications      Swallow Study           Oral/Motor/Sensory Function Overall Oral Motor/Sensory Function:  (unable to formally assess at current)   Circuit Cityce Chips Ice chips: Impaired Pharyngeal Phase Impairments:  (none)   Thin Liquid Thin Liquid: Within functional limits Presentation: Cup;Straw Pharyngeal  Phase Impairments:  (none)    Nectar Thick Nectar Thick Liquid: Not tested   Honey Thick Honey Thick Liquid: Not tested   Puree Puree: Within functional limits   Solid   GO    Solid: Not tested       [name], [name] 09/29/2014,2:41 PM  Breck CoonsLisa Green Lonell FaceLitaker M.Ed [employer] Pager [phone]

## 2014-09-29 NOTE — Progress Notes (Signed)
Patient went into affib which rapidly degenerated into a ventricular tachycardia in the 220s. Patient did not loose pulse  but blood pressure dropped from systolic of 180 to 98. Patient was cardioverted in the presence of doctor Leeann MustJacob Kelly. Family informed and patient is being moved to the icu.

## 2014-09-29 NOTE — Progress Notes (Signed)
[facility] TEAM 1 - Stepdown/ICU TEAM Progress Note  Caesar Mannella UJW:[id number] DOB: 08/03/1965 DOA: 09/24/2014 PCP: [name], NP  Admit HPI / Brief Narrative: 49 y.o. M Hx hypertension, hyperlipidemia, diabetes mellitus, GERD, CAD S/P CABG 2010), combined systolic and diastolic congestive heart failure (EF 20-25% with grade 3 diastolic dysfunction), chronic kidney disease-stage IV, severe PAD, and substance abuse who presented with right leg swelling and pain for several days. His legs became red from foot to upper thigh medially. He did not have fever or chills. Patient was unable to ambulate secondary to pain.   In ED patient was found to have WBC 13.8, tachycardia, normal temperature, stable blood pressure, AoCKD-IV, negative chest x-ray for acute abnormalities, ALP 168, AST 27, ALT 15, total bilirubin 4.4, lactate 1.85, INR 1.39, urinalysis with small amount of leukocytes.   HPI/Subjective: The patient remains quite sedate.  He will arouse when his name is called but quickly becomes obtunded again.  He does not appear to be uncomfortable or in extremities.  He is not able to provide a review of systems however due to his sedation.  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  -Previous creatinine 5.85 on 03/11/14  Bilateral lower extremity cellulitis w/ sepsis due to unspecified organism -Meets sepsis criteria with leukocytosis and tachycardia  -continue empiric antibiotic  -Blood cultures x 2 NGTD  Jaundice  -Acute Hepatitis panel negative -RPR negative -anti-nuclear antibody pending -HIV negative -will need to image liver parenchyma when more stable  Elevated lipase  -?acute pancreatitis - follow trend - image abdomen when more stable   PAD -Patient had arterial Doppler 2013, which showed  right and left anterior tibial arteries and Left SFA-occluded; Right CIA-<50% diameter reduction; Right Deep Profunda-70-99% diameter reduction; Right SFA->60% diameter reduction; Right Distal Popliteal/Tibial Artery- >60% diameter reduction; Left CFA and Profunda- >50% diameter reduction. -Vasc Surgery following - to begin w/ ABIs  CAD s/p CABG 2010 -EKG showed old left bundle blockage -no findings to suggest ACS at this time   Chronic Systolic CHF / Cardiomyopathy  -TTE 02/21/14 showed EF 20-25% with grade 3 diastolic dysfunction.  -seen by Dr. Royann Shivers on 08/07/14 w/ decision regarding cardiac device therapy depending heavily on his decision regarding dialysis - ICD was considered if he started dialysis. -6/3 TTE shows cardiomyopathy with worsening EF - see below  -consider cardiac reeval if tolerates and commits to ongiong HD when more alert   HTN -Follow w/ ongoing HD   Hyperlipidemia:  -lipid panel abnormal  DM type-II controlled:  -6/3 A1c 6.4 - CBG currently well controlled   Hypokalemia Resolved   Code Status: FULL Family Communication: no family present at time of exam Disposition Plan: SDU - ongoing acute HD   Consultants: Nephrology Vasc Surgery  Procedure/Significant Events: 6/3 TTE mild LVH - EF= 20%. Diffuse hypokinesis - Left atrium:  moderately dilated - Right ventricle: mildly to moderately dilated. - Right atrium: The atrium was mildly dilated.  Antibiotics: Vancomycin 6 /1 > Zosyn 6/1 >  DVT prophylaxis: Subcutaneous heparin  Objective: Blood pressure 145/76, pulse 63, temperature 97.9 F (36.6 C), temperature source Oral, resp. rate 17, height  (1.803 m), weight 81.6 kg (179 lb 14.3 oz), SpO2 100 %.  Intake/Output Summary (Last 24 hours) at 09/29/14 1421 Last data filed at 09/29/14 0800  Gross per 24 hour  Intake  1250 ml  Output   1210 ml  Net     40 ml   Exam: General: No acute respiratory distress - obtunded  Lungs: Clear to  auscultation bilaterally without wheezes or crackles Cardiovascular: Regular rate and rhythm without murmur gallop or rub normal S1 and S2 - distant HS  Abdomen: Nontender, protuberant/distended w/ ?ascites, soft, bowel sounds hypoactive, no rebound, no appreciable mass Extremities: No significant cyanosis, clubbing, or edema bilateral lower extremities Cutaneous:  Multiple R LE cutaneous superficial wounds w/ areas of dark eschar - anterior tibial ulceration L LE  Data Reviewed: Basic Metabolic Panel:  Recent Labs Lab 09/25/14 0410 09/26/14 0345 09/27/14 0236 09/28/14 0540 09/29/14 0013 09/29/14 1036  NA 135 138 140 143 144  --   K 3.7 3.3* 3.4* 4.2 3.9  --   CL 97* 97* 100* 102 103  --   CO2 19* 19* 21* 19* 19*  --   GLUCOSE 115* 75 98 78 109*  --   BUN 128* 130* 130* 140* 144*  --   CREATININE 7.61* 7.35* 7.35* 7.52* 7.82*  --   CALCIUM 8.2* 8.3* 8.2* 8.3* 8.1*  --   MG  --  2.4 2.3 2.4 2.5*  --   PHOS  --   --   --   --   --  7.4*   Liver Function Tests:  Recent Labs Lab 09/25/14 0410 09/26/14 0345 09/27/14 0236 09/28/14 0540 09/29/14 0013  AST 21 18 17 25 19   ALT 13* 13* 11* 12* 9*  ALKPHOS 126 113 99 96 80  BILITOT 3.5* 3.5* 3.2* 3.4* 3.4*  PROT 5.9* 6.2* 5.8* 6.0* 5.6*  ALBUMIN 2.4* 2.4* 2.2* 2.2* 2.0*    Recent Labs Lab 09/26/14 0100  LIPASE 106*   CBC:  Recent Labs Lab 09/24/14 1905 09/25/14 0410 09/26/14 0345 09/27/14 0236 09/28/14 0540 09/29/14 0013  WBC 13.8* 11.0* 12.4* 11.7* 12.5* 12.2*  NEUTROABS 13.0*  --  11.6* 10.8* 11.2* 11.2*  HGB 12.8* 10.8* 11.5* 10.4* 11.3* 10.8*  HCT 38.0* 31.9* 34.2* 31.4* 34.9* 33.5*  MCV 86.8 87.4 87.2 87.0 87.7 88.6  PLT 167 132* 145* 156 182 172   CBG:  Recent Labs Lab 09/28/14 2008 09/28/14 2339 09/29/14 0503 09/29/14 0741 09/29/14 1110  GLUCAP 98 117* 134* 114* 122*    Recent Results (from the past 240 hour(s))  Blood Culture (routine x 2)     Status: None (Preliminary result)   Collection  Time: 09/24/14  7:17 PM  Result Value Ref Range Status   Specimen Description BLOOD RIGHT ARM  Final   Special Requests BOTTLES DRAWN AEROBIC AND ANAEROBIC 5CC  Final   Culture   Final           BLOOD CULTURE RECEIVED NO GROWTH TO DATE CULTURE WILL BE HELD FOR 5 DAYS BEFORE ISSUING A FINAL NEGATIVE REPORT Performed at [facility]    Report Status PENDING  Incomplete  Blood Culture (routine x 2)     Status: None (Preliminary result)   Collection Time: 09/24/14  7:17 PM  Result Value Ref Range Status   Specimen Description BLOOD LEFT FOREARM  Final   Special Requests BOTTLES DRAWN AEROBIC AND ANAEROBIC 5CC  Final   Culture   Final           BLOOD CULTURE RECEIVED NO GROWTH TO DATE CULTURE WILL BE HELD FOR 5 DAYS BEFORE ISSUING A FINAL NEGATIVE REPORT Performed at [facility]    Report Status PENDING  Incomplete  Urine culture     Status: None   Collection Time: 09/24/14  7:23 PM  Result Value Ref Range Status   Specimen Description URINE, RANDOM  Final   Special Requests NONE  Final   Colony Count   Final    8,000 COLONIES/ML Performed at [facility]    Culture   Final    INSIGNIFICANT GROWTH Performed at [facility]    Report Status 09/25/2014 FINAL  Final  MRSA PCR Screening     Status: None   Collection Time: 09/25/14  7:00 AM  Result Value Ref Range Status   MRSA by PCR NEGATIVE NEGATIVE Final    Comment:        The GeneXpert MRSA Assay (FDA approved for NASAL specimens only), is one component of a comprehensive MRSA colonization surveillance program. It is not intended to diagnose MRSA infection nor to guide or monitor treatment for MRSA infections.      Studies:  Recent x-ray studies have been reviewed in detail by the Attending Physician  Scheduled Meds:  Scheduled Meds: . antiseptic oral rinse  7 mL Mouth Rinse q12n4p  . chlorhexidine  15 mL Mouth Rinse BID  . folic acid  1 mg Intravenous Daily  . heparin  4,000  Units Dialysis Once in dialysis  . hydrALAZINE  10 mg Intravenous 6 times per day  . insulin aspart  0-9 Units Subcutaneous 6 times per day  . insulin detemir  3 Units Subcutaneous QAC breakfast  . metoprolol  5 mg Intravenous 4 times per day  . multivitamin with minerals  1 tablet Oral Daily  . pantoprazole (PROTONIX) IV  40 mg Intravenous Q24H  . piperacillin-tazobactam (ZOSYN)  IV  2.25 g Intravenous 3 times per day  . sodium chloride  3 mL Intravenous Q12H  . thiamine  100 mg Oral Daily   Or  . thiamine  100 mg Intravenous Daily    Time spent on care of this patient: 35 mins  Lonia Blood, MD [facility] For Consults/Admissions - Flow Manager - [phone] Office  [phone]  Contact MD directly via text page:      [email or web address]      password [facility]  09/29/2014, 2:21 PM   LOS: 5 days

## 2014-09-29 NOTE — Progress Notes (Signed)
S:Moaning but unable to voice any specific CO O:BP 175/90 mmHg  Pulse 63  Temp(Src) 98 F (36.7 C) (Axillary)  Resp 20  Ht 5\' 11"  (1.803 m)  Wt 81.6 kg (179 lb 14.3 oz)  BMI 25.10 kg/m2  SpO2 100%  Intake/Output Summary (Last 24 hours) at 09/29/14 [id number] Last data filed at 09/29/14 0800  Gross per 24 hour  Intake   1250 ml  Output   1360 ml  Net   -110 ml   Weight change: 0 kg (0 lb) BJY:NWGNFGen:awake and alert CVS:RRR Resp: decreased BS bases Abd: + BS NTND Ext: + edema, mostly in thighs.  Cellulitis lower ext.  Lt fem HD cath NEURO: Ox1   . ferrous sulfate  325 mg Oral Q breakfast  . folic acid  1 mg Intravenous Daily  . heparin  4,000 Units Dialysis Once in dialysis  . hydrALAZINE  10 mg Intravenous 6 times per day  . insulin aspart  0-9 Units Subcutaneous 6 times per day  . insulin detemir  3 Units Subcutaneous QAC breakfast  . metoprolol  5 mg Intravenous 4 times per day  . multivitamin with minerals  1 tablet Oral Daily  . pantoprazole (PROTONIX) IV  40 mg Intravenous Q24H  . piperacillin-tazobactam (ZOSYN)  IV  2.25 g Intravenous 3 times per day  . sodium bicarbonate  1,300 mg Oral BID  . sodium chloride  3 mL Intravenous Q12H  . thiamine  100 mg Oral Daily   Or  . thiamine  100 mg Intravenous Daily  . vancomycin  1,000 mg Intravenous Q48H   Dg Chest Portable 1 View  09/27/2014   CLINICAL DATA:  Central line placement  EXAM: PORTABLE CHEST - 1 VIEW  COMPARISON:  09/24/2014  FINDINGS: There is a central line from an inferior approach which terminates in the right atrium. Mild pulmonary edema pattern. No pneumothorax.  IMPRESSION: Central venous line from a femoral approach with tip in the right atrium.   Electronically Signed   By: Genevive BiStewart  Edmunds M.D.   On: 09/27/2014 19:30   BMET    Component Value Date/Time   NA 144 09/29/2014 0013   K 3.9 09/29/2014 0013   CL 103 09/29/2014 0013   CO2 19* 09/29/2014 0013   GLUCOSE 109* 09/29/2014 0013   BUN 144* 09/29/2014 0013    CREATININE 7.82* 09/29/2014 0013   CALCIUM 8.1* 09/29/2014 0013   GFRNONAA 7* 09/29/2014 0013   GFRAA 8* 09/29/2014 0013   CBC    Component Value Date/Time   WBC 12.2* 09/29/2014 0013   RBC 3.78* 09/29/2014 0013   HGB 10.8* 09/29/2014 0013   HCT 33.5* 09/29/2014 0013   PLT 172 09/29/2014 0013   MCV 88.6 09/29/2014 0013   MCH 28.6 09/29/2014 0013   MCHC 32.2 09/29/2014 0013   RDW 15.2 09/29/2014 0013   LYMPHSABS 0.8 09/29/2014 0013   MONOABS 0.2 09/29/2014 0013   EOSABS 0.0 09/29/2014 0013   BASOSABS 0.0 09/29/2014 0013     Assessment: 1.  New ESRD 2. Anemia 3. DM 4. Cardiomyopathy   Plan: 1. HD today and again tomorrow 2. Check iron studies, PTH and PO4 3. DC PO bicarb and iron   Creedence Heiss T

## 2014-09-29 NOTE — Progress Notes (Signed)
PT Cancellation Note  Patient Details Name: [name] MRN: [id number] DOB: 09-Mar-1966   Cancelled Treatment:    Reason Eval/Treat Not Completed: Medical issues which prohibited therapy (pt not with temporary left femoral HD cath and unable to mobilize OOB. will follow for HD cath removal to mobilize)   Toney Sangabor, Kaydee Magel [facility] 09/29/2014, 7:27 AM Delaney MeigsMaija Tabor Lynnmarie Lovett, PT [phone]

## 2014-09-29 NOTE — Care Management Note (Signed)
Case Management Note  Patient Details  Name: [name] MRN: [id number] Date of Birth: 1966-04-10  Subjective/Objective:                    Action/Plan:   Expected Discharge Date:                  Expected Discharge Plan:  Home w Hospice Care  In-House Referral:     Discharge planning Services     Post Acute Care Choice:    Choice offered to:     DME Arranged:    DME Agency:     HH Arranged:    HH Agency:     Status of Service:     Medicare Important Message Given:  Yes Date Medicare IM Given:  09/29/14 Medicare IM give by:  debbie Aaryn Sermon rn,bsn Date Additional Medicare IM Given:  09/29/14 Additional Medicare Important Message give by:  debbie Misa Fedorko rn,bsn  If discussed at Long Length of Stay Meetings, dates discussed:  09/30/14  Additional Comments:  [name], [name] Lookabaugh T, RN 09/29/2014, 9:29 AM

## 2014-09-30 ENCOUNTER — Inpatient Hospital Stay (HOSPITAL_COMMUNITY): Payer: Medicare Other

## 2014-09-30 DIAGNOSIS — N189 Chronic kidney disease, unspecified: Secondary | ICD-10-CM

## 2014-09-30 DIAGNOSIS — I5022 Chronic systolic (congestive) heart failure: Secondary | ICD-10-CM

## 2014-09-30 DIAGNOSIS — L03116 Cellulitis of left lower limb: Secondary | ICD-10-CM

## 2014-09-30 DIAGNOSIS — L03115 Cellulitis of right lower limb: Secondary | ICD-10-CM

## 2014-09-30 DIAGNOSIS — N179 Acute kidney failure, unspecified: Secondary | ICD-10-CM

## 2014-09-30 DIAGNOSIS — I429 Cardiomyopathy, unspecified: Secondary | ICD-10-CM

## 2014-09-30 DIAGNOSIS — A419 Sepsis, unspecified organism: Secondary | ICD-10-CM | POA: Diagnosis present

## 2014-09-30 DIAGNOSIS — I739 Peripheral vascular disease, unspecified: Secondary | ICD-10-CM

## 2014-09-30 LAB — PARATHYROID HORMONE, INTACT (NO CA): PTH: 97 pg/mL — AB (ref 15–65)

## 2014-09-30 LAB — URINALYSIS, ROUTINE W REFLEX MICROSCOPIC
Glucose, UA: 100 mg/dL — AB
KETONES UR: 15 mg/dL — AB
NITRITE: NEGATIVE
Protein, ur: 100 mg/dL — AB
Specific Gravity, Urine: 1.017 (ref 1.005–1.030)
Urobilinogen, UA: 1 mg/dL (ref 0.0–1.0)
pH: 5 (ref 5.0–8.0)

## 2014-09-30 LAB — RENAL FUNCTION PANEL
ALBUMIN: 2.3 g/dL — AB (ref 3.5–5.0)
ANION GAP: 19 — AB (ref 5–15)
BUN: 34 mg/dL — ABNORMAL HIGH (ref 6–20)
CALCIUM: 8.3 mg/dL — AB (ref 8.9–10.3)
CO2: 22 mmol/L (ref 22–32)
CREATININE: 3.12 mg/dL — AB (ref 0.61–1.24)
Chloride: 99 mmol/L — ABNORMAL LOW (ref 101–111)
GFR calc Af Amer: 25 mL/min — ABNORMAL LOW (ref >60)
GFR, EST NON AFRICAN AMERICAN: 22 mL/min — AB (ref >60)
GLUCOSE: 112 mg/dL — AB (ref 65–99)
Phosphorus: 3.8 mg/dL (ref 2.5–4.6)
Potassium: 3.6 mmol/L (ref 3.5–5.1)
SODIUM: 140 mmol/L (ref 135–145)

## 2014-09-30 LAB — CBC
HCT: 39.1 % (ref 39.0–52.0)
HEMATOCRIT: 35.2 % — AB (ref 39.0–52.0)
HEMOGLOBIN: 11.3 g/dL — AB (ref 13.0–17.0)
HEMOGLOBIN: 12.8 g/dL — AB (ref 13.0–17.0)
MCH: 27.8 pg (ref 26.0–34.0)
MCH: 28.5 pg (ref 26.0–34.0)
MCHC: 32.1 g/dL (ref 30.0–36.0)
MCHC: 32.7 g/dL (ref 30.0–36.0)
MCV: 86.7 fL (ref 78.0–100.0)
MCV: 87.1 fL (ref 78.0–100.0)
PLATELETS: 177 10*3/uL (ref 150–400)
Platelets: 184 10*3/uL (ref 150–400)
RBC: 4.06 MIL/uL — ABNORMAL LOW (ref 4.22–5.81)
RBC: 4.49 MIL/uL (ref 4.22–5.81)
RDW: 15.1 % (ref 11.5–15.5)
RDW: 15.1 % (ref 11.5–15.5)
WBC: 13.1 10*3/uL — AB (ref 4.0–10.5)
WBC: 14.7 10*3/uL — ABNORMAL HIGH (ref 4.0–10.5)

## 2014-09-30 LAB — TROPONIN I
Troponin I: 0.07 ng/mL — ABNORMAL HIGH (ref <0.031)
Troponin I: 0.1 ng/mL — ABNORMAL HIGH (ref <0.031)
Troponin I: 0.11 ng/mL — ABNORMAL HIGH (ref <0.031)

## 2014-09-30 LAB — GLUCOSE, CAPILLARY
GLUCOSE-CAPILLARY: 133 mg/dL — AB (ref 65–99)
GLUCOSE-CAPILLARY: 132 mg/dL — AB (ref 65–99)
GLUCOSE-CAPILLARY: 100 mg/dL — AB (ref 65–99)
Glucose-Capillary: 125 mg/dL — ABNORMAL HIGH (ref 65–99)
Glucose-Capillary: 114 mg/dL — ABNORMAL HIGH (ref 65–99)
Glucose-Capillary: 124 mg/dL — ABNORMAL HIGH (ref 65–99)

## 2014-09-30 LAB — BLOOD GAS, ARTERIAL
Acid-base deficit: 3.6 mmol/L — ABNORMAL HIGH (ref 0.0–2.0)
Bicarbonate: 19.7 mEq/L — ABNORMAL LOW (ref 20.0–24.0)
DRAWN BY: 10006
O2 CONTENT: 2 L/min
O2 Saturation: 97.7 %
PATIENT TEMPERATURE: 98.6
PH ART: 7.452 — AB (ref 7.350–7.450)
TCO2: 20.6 mmol/L (ref 0–100)
pCO2 arterial: 28.6 mmHg — ABNORMAL LOW (ref 35.0–45.0)
pO2, Arterial: 119 mmHg — ABNORMAL HIGH (ref 80.0–100.0)

## 2014-09-30 LAB — HEPARIN LEVEL (UNFRACTIONATED)
HEPARIN UNFRACTIONATED: 0.13 [IU]/mL — AB (ref 0.30–0.70)
Heparin Unfractionated: 0.32 IU/mL (ref 0.30–0.70)

## 2014-09-30 LAB — URINE MICROSCOPIC-ADD ON

## 2014-09-30 LAB — OSMOLALITY: OSMOLALITY: 312 mosm/kg — AB (ref 275–300)

## 2014-09-30 LAB — SALICYLATE LEVEL

## 2014-09-30 LAB — LACTIC ACID, PLASMA
LACTIC ACID, VENOUS: 1.4 mmol/L (ref 0.5–2.0)
Lactic Acid, Venous: 1.3 mmol/L (ref 0.5–2.0)

## 2014-09-30 LAB — TSH: TSH: 5.7 u[IU]/mL — ABNORMAL HIGH (ref 0.350–4.500)

## 2014-09-30 LAB — ANTINUCLEAR ANTIBODIES, IFA: ANA Ab, IFA: NEGATIVE

## 2014-09-30 LAB — AMMONIA: Ammonia: 28 umol/L (ref 9–35)

## 2014-09-30 LAB — CORTISOL: CORTISOL PLASMA: 37.2 ug/dL

## 2014-09-30 LAB — VANCOMYCIN, RANDOM: Vancomycin Rm: 27 ug/mL

## 2014-09-30 LAB — OSMOLALITY, URINE: OSMOLALITY UR: 335 mosm/kg — AB (ref 390–1090)

## 2014-09-30 MED ORDER — LORAZEPAM 2 MG/ML IJ SOLN
INTRAMUSCULAR | Status: AC
  Filled 2014-09-30: qty 1

## 2014-09-30 MED ORDER — LORAZEPAM 2 MG/ML IJ SOLN
0.5000 mg | Freq: Once | INTRAMUSCULAR | Status: AC
  Administered 2014-09-30: 0.5 mg via INTRAVENOUS
  Filled 2014-09-30: qty 1

## 2014-09-30 MED ORDER — VANCOMYCIN HCL IN DEXTROSE 1-5 GM/200ML-% IV SOLN
1000.0000 mg | INTRAVENOUS | Status: DC | PRN
  Filled 2014-09-30: qty 200

## 2014-09-30 MED ORDER — HEPARIN BOLUS VIA INFUSION
4000.0000 [IU] | Freq: Once | INTRAVENOUS | Status: AC
  Administered 2014-09-30: 4000 [IU] via INTRAVENOUS
  Filled 2014-09-30: qty 4000

## 2014-09-30 MED ORDER — SODIUM CHLORIDE 0.9 % IV SOLN
510.0000 mg | INTRAVENOUS | Status: DC
  Administered 2014-10-08: 510 mg via INTRAVENOUS
  Filled 2014-09-30 (×3): qty 17

## 2014-09-30 MED ORDER — HEPARIN (PORCINE) IN NACL 100-0.45 UNIT/ML-% IJ SOLN
1700.0000 [IU]/h | INTRAMUSCULAR | Status: DC
  Administered 2014-09-30: 1150 [IU]/h via INTRAVENOUS
  Administered 2014-09-30: 1400 [IU]/h via INTRAVENOUS
  Filled 2014-09-30 (×2): qty 250

## 2014-09-30 NOTE — Progress Notes (Signed)
Pt seen on HD.  Ap 110  Vp 100.  BFR 300.  Labs Pending.  Will plan HD again in AM.  Spoke with Dr Darrick PennaFields about getting PC placed this week.

## 2014-09-30 NOTE — Progress Notes (Signed)
ANTICOAGULATION CONSULT NOTE - Initial Consult  Pharmacy Consult for Heparin Indication: atrial fibrillation  No Known Allergies  Patient Measurements: Height: 5\' 11"  (180.3 cm) (family reports at the bedside. ) Weight: 175 lb 7.8 oz (79.6 kg) IBW/kg (Calculated) : 75.3 Heparin Dosing Weight: 79 kg  Vital Signs: Temp: 97.4 F (36.3 C) (06/06 2317) Temp Source: Oral (06/06 2317) BP: 176/85 mmHg (06/06 2200) Pulse Rate: 95 (06/06 2200)  Labs:  Recent Labs  09/27/14 0236 09/27/14 1120 09/28/14 0540 09/29/14 0013 09/29/14 2301  HGB 10.4*  --  11.3* 10.8*  --   HCT 31.4*  --  34.9* 33.5*  --   PLT 156  --  182 172  --   LABPROT  --  17.8*  --   --   --   INR  --  1.45  --   --   --   CREATININE 7.35*  --  7.52* 7.82* 4.65*  TROPONINI  --   --   --   --  0.07*    Estimated Creatinine Clearance: 20.2 mL/min (by C-G formula based on Cr of 4.65).   Medical History: Past Medical History  Diagnosis Date  . Coronary artery disease 07/05/2011    2D ECHO - EF ~40%, moderate concentric LV hypertrophy, LA moderately dilated, mild-moderate septal and inferior wall hypokinesis  . Hypertension   . CHF (congestive heart failure)   . Diabetes mellitus     Insulin dependent  . Cardiomyopathy   . Chronic kidney disease (CKD), stage IV (severe)   . Claudication 05/09/2011    Right and left anterior tibial arteries and Left SFA-occluded; Right CIA-<50% diameter reduction; Right Deep Profunda-70-99% diameter reduction; Right SFA->60% diameter reduction; Right Distal Popliteal/Tibial Artery- >60% diameter reduction; Left CFA and Profunda- >50% diameter reduction  . 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 GERD (gastroesophageal reflux disease)     Medications:  Prescriptions prior to admission   Medication Sig Dispense Refill Last Dose  . calcium carbonate (TUMS EX) 750 MG chewable tablet Chew 1 tablet by mouth as needed for heartburn.   09/24/2014 at Unknown time  . Ferrous Sulfate (IRON) 325 (65 FE) MG TABS Take 325 mg by mouth daily.   09/24/2014 at Unknown time  . furosemide (LASIX) 80 MG tablet Take 1 tablet (80 mg total) by mouth daily. 30 tablet 5 09/24/2014 at Unknown time  . hydrALAZINE (APRESOLINE) 50 MG tablet Take 1 tablet (50 mg total) by mouth every 8 (eight) hours. 90 tablet 6 09/24/2014 at Unknown time  . isosorbide mononitrate (IMDUR) 60 MG 24 hr tablet Take 1 tablet (60 mg total) by mouth daily. 60 tablet 6 09/24/2014 at Unknown time  . LEVEMIR 100 UNIT/ML injection Inject 5 Units into the skin daily before breakfast.   09/24/2014 at Unknown time  . metoprolol (LOPRESSOR) 50 MG tablet Take 1 tablet (50 mg total) by mouth 2 (two) times daily. 60 tablet 9 09/24/2014 at Unknown time  . Oxycodone HCl 10 MG TABS Take 1 tablet by mouth every 6 (six) hours as needed. For pain   09/24/2014 at Unknown time  . pantoprazole (PROTONIX) 40 MG tablet Take 40 mg by mouth daily.    09/24/2014 at Unknown time  . sodium bicarbonate 650 MG tablet Take 650 mg by mouth 2 (two) times daily.  09/24/2014 at Unknown time  . acyclovir (ZOVIRAX) 800 MG tablet Take 1 tablet (800 mg total) by mouth 5 (five) times daily. (Patient not taking: Reported on 09/24/2014) 56 tablet 0 Not Taking at Unknown time  . nitroGLYCERIN (NITROSTAT) 0.4 MG SL tablet Place 1 tablet (0.4 mg total) under the tongue once. (Patient taking differently: Place 0.4 mg under the tongue every 5 (five) minutes as needed for chest pain. ) 25 tablet 5 unknown at unknown    Assessment: 49 y.o. male known to pharmacy from antibiotic dosing for cellulitis. 6/6 late PM he was noted to be in VT with HR in low 200's. He was cardioverted successfully with 120 joules and bolused/started on amiodarone gtt. To begin heparin for afib (CHADVASC score =4). CBC  stable. Noted pt now with new ESRD - HD started 6/5.  Received SQ heparin 5000 units ~2130  Goal of Therapy:  Heparin level 0.3-0.7 units/ml Monitor platelets by anticoagulation protocol: Yes   Plan:  Heparin IV bolus 4000 units Heparin IV gtt 1150 units/hr Will f/u heparin level in 8 hours Daily heparin level and CBC  Christoper Fabian, PharmD, BCPS Clinical pharmacist, pager [phone] 09/30/2014,12:43 AM

## 2014-09-30 NOTE — Progress Notes (Signed)
Pt very agitated, yelling and pulling at lines, sister called, dr. Dellie Catholicsommers also called. Orders to try low dose ativan. Will continue to monitor.

## 2014-09-30 NOTE — Progress Notes (Signed)
CSW met with patient's sister [name], Mother [name] and sister [name] this afternoon for supportive visit. Family noted to be very tearful stating that his condition has deteriorated.  Patient's sister remains the main contact for her brother and stated that if needed- she can be reached on her cell phone but only able to check messages at 9:30, 12, 2 and 4:30.  She stated that her brother is in pain and nursing is aware and doing all they can to help control his pain. An extensive discussion was held re: patient's wishes and current medical condition. Sister is adamant that her brother NOT want his "leg taken off". We briefly discussed issues re: current dialysis regimen. Sister states that he was very angry with her for making the decision to start him on dialysis. Family is aware that if he is "stretcher bound" -then dialysis would be an issue if he went home or to a SNF.  Family verbalized frustrations that they have been unable to speak to an MD since patient's hospitalization and feel that they have many unanswered questions. CSW also discussed benefits of a Palliative Care conference to include all involved to determine goals of care for patient. Family is open to this. CSW discussed above with Elissa Hefty, [facility] and she will address with MD tomorrow.  Sister also upset that she feels that her brother's home medications are "missing". She has discussed with patient's nurse and there has been an investigation within the hospital for the medications. Sister states that she was told that the ER never received any meds for patient on arrival. Sister states that the EMS personnel indicated that they had patient's meds and held up the bag that the meds were in.  CSW has spoken to [facility] re: above. He was able to pull the patient's ticket and will follow up with his staff to ascertain if there is any memory of patient's meds.  Will ask supervisor to follow up with patient's sister re: this.  Lorie Phenix. Pauline Good, [facility]

## 2014-09-30 NOTE — Progress Notes (Signed)
SLP Cancellation Note  Patient Details Name: Felecia JanFrank Arnone MRN: [id number] DOB: 06-17-1965   Cancelled treatment:        Attempted to see this afternoon for dysphagia intervention. Pt required Ativan this morning during dialysis and was not adequately alert for ST session. Will continue efforts.   Royce MacadamiaLitaker, Micael Barb Willis 09/30/2014, 2:34 PM  Breck CoonsLisa Willis Lonell FaceLitaker M.Ed [employer] Pager [phone]

## 2014-09-30 NOTE — Clinical Social Work Note (Signed)
Patient moved to 2H01, CSW gave verbal handoff to unit CSW.  This CSW to sign off.  Ervin KnackEric R. Latoia Eyster, MSW, LCSWA [phone] 09/30/2014 10:24 AM

## 2014-09-30 NOTE — Progress Notes (Signed)
ANTICOAGULATION CONSULT NOTE - Follow Up Consult  Pharmacy Consult for Heparin Indication: atrial fibrillation  No Known Allergies  Patient Measurements: Height: 5\' 11"  (180.3 cm) (family reports at the bedside. ) Weight: 176 lb 5.9 oz (80 kg) IBW/kg (Calculated) : 75.3 Heparin Dosing Weight: 80 kg  Vital Signs: Temp: 97.1 F (36.2 C) (06/07 0850) Temp Source: Oral (06/07 0850) BP: 142/76 mmHg (06/07 0930) Pulse Rate: 71 (06/07 0930)  Labs:  Recent Labs  09/27/14 1120  09/28/14 0540 09/29/14 0013 09/29/14 2301 09/30/14 0110 09/30/14 0510 09/30/14 0930  HGB  --   < > 11.3* 10.8*  --   --  11.3*  --   HCT  --   --  34.9* 33.5*  --   --  35.2*  --   PLT  --   --  182 172  --   --  184  --   LABPROT 17.8*  --   --   --   --   --   --   --   INR 1.45  --   --   --   --   --   --   --   HEPARINUNFRC  --   --   --   --   --   --   --  0.13*  CREATININE  --   --  7.52* 7.82* 4.65*  --   --   --   TROPONINI  --   --   --   --  0.07* 0.07*  --   --   < > = values in this interval not displayed.  Estimated Creatinine Clearance: 20.2 mL/min (by C-G formula based on Cr of 4.65).   Medications:  Heparin @ 1150 units/hr (11.5 ml/hr)  Assessment: 49 YOM who was noted to be in VT with HR in low 200's on 6/6 late PM - s/p cardioversion and started on amiodarone drip >> the patient was then noted to be in Afib.  Pharmacy consulted to dose heparin for anticoagulation (CHADVASC score = 4)  Heparin level this morning is SUBtherapeutic (HL 0.13, goal of 0.3-0.7). CBC stable - no bleeding noted. Holding bolus as HD RN not familiar with entering this in pump and did not feel comfortable doing this - will simply increase the drip rate.   Goal of Therapy:  Heparin level 0.3-0.7 units/ml Monitor platelets by anticoagulation protocol: Yes   Plan:  1. Increase heparin to 1400 units/hr (14 ml/hr) 2. Will continue to monitor for any signs/symptoms of bleeding and will follow up with heparin  level in 8 hours   Georgina PillionElizabeth Aarushi Hemric, PharmD, BCPS Clinical Pharmacist Pager: [phone] 09/30/2014 10:24 AM

## 2014-09-30 NOTE — Progress Notes (Signed)
VASCULAR LAB PRELIMINARY  ARTERIAL  ABI completed:    RIGHT    LEFT    PRESSURE WAVEFORM  PRESSURE WAVEFORM  BRACHIAL 135 triphasic BRACHIAL  triphasic  DP  absent DP    AT  absent AT 54 Dampened monophasic  PT  absent PT 120 Dampened monophasic  PER   PER    GREAT TOE  absent GREAT TOE  absent    RIGHT LEFT  ABI  0.89     Alesia BandaBiggs, Shaley Leavens, RVT 09/30/2014, 1:48 PM

## 2014-09-30 NOTE — Progress Notes (Signed)
eLink Physician-Brief Progress Note Patient Name: [name] JanFrank Wertheim DOB: Sep 14, 1965 MRN: [id number]   Date of Service  09/30/2014  HPI/Events of Note  Delirium. Given Ativan 1 mg about noon today and became obtunded.   eICU Interventions  Will try Ativan 0.5 mg IV now.      Intervention Category Major Interventions: Delirium, psychosis, severe agitation - evaluation and management  Sommer,Steven Eugene 09/30/2014, 8:42 PM

## 2014-09-30 NOTE — Progress Notes (Signed)
ANTICOAGULATION CONSULT NOTE - Follow Up Consult  Pharmacy Consult for Heparin Indication: atrial fibrillation  No Known Allergies  Patient Measurements: Height: 5\' 11"  (180.3 cm) (family reports at the bedside. ) Weight: 171 lb 11.8 oz (77.9 kg) IBW/kg (Calculated) : 75.3 Heparin Dosing Weight: 80 kg  Vital Signs: Temp: 97.8 F (36.6 C) (06/07 1600) Temp Source: Oral (06/07 1600) BP: 142/70 mmHg (06/07 1800) Pulse Rate: 69 (06/07 1800)  Labs:  Recent Labs  09/29/14 0013  09/29/14 2301 09/30/14 0110 09/30/14 0510 09/30/14 0930 09/30/14 1303 09/30/14 1830  HGB 10.8*  --   --   --  11.3*  --  12.8*  --   HCT 33.5*  --   --   --  35.2*  --  39.1  --   PLT 172  --   --   --  184  --  177  --   HEPARINUNFRC  --   --   --   --   --  0.13*  --  0.32  CREATININE 7.82*  --  4.65*  --   --   --  3.12*  --   TROPONINI  --   < > 0.07* 0.07* 0.10*  --  0.11*  --   < > = values in this interval not displayed.  Estimated Creatinine Clearance: 30.2 mL/min (by C-G formula based on Cr of 3.12).   Medications:  Heparin @ 1400 units/hr (14 ml/hr)  Assessment: 49 YOM who was noted to be in VT with HR in low 200's on 6/6 late PM - s/p cardioversion and started on amiodarone drip >> the patient was then noted to be in Afib.  Pharmacy consulted to dose heparin for anticoagulation (CHADVASC score = 4)  Heparin level this evening after a rate increase this morning is in therapeutic range, but on lower range at 0.32 units/mL.  Goal of Therapy:  Heparin level 0.3-0.7 units/ml Monitor platelets by anticoagulation protocol: Yes   Plan:  - Increase heparin to 1500 units/hr (15 ml/hr) to ensure patient stays therapeutic - next HL with AM labs - daily HL and CBC - Will continue to monitor for any signs/symptoms of bleeding  Karena Kinker D. Adreanna Fickel, PharmD, BCPS Clinical Pharmacist Pager: [phone] 09/30/2014 8:21 PM

## 2014-09-30 NOTE — Progress Notes (Signed)
OT Cancellation Note  Patient Details Name: [name] MRN: [id number] DOB: 08/04/1965   Cancelled Treatment:    Reason Eval/Treat Not Completed: Medical issues which prohibited therapy. Pt with temporary femoral HD catheter, mobilizing contraindicated.  Will follow.  Evern BioMayberry, Etosha Wetherell Lynn 09/30/2014, 10:45 AM

## 2014-09-30 NOTE — Progress Notes (Signed)
ANTIBIOTIC CONSULT NOTE - FOLLOW UP  Pharmacy Consult for Vancomycin and Zosyn Indication: cellulitis  No Known Allergies  Patient Measurements: Height: 5\' 11"  (180.3 cm) (family reports at the bedside. ) Weight: 171 lb 11.8 oz (77.9 kg) IBW/kg (Calculated) : 75.3  Vital Signs: Temp: 97.7 F (36.5 C) (06/07 2000) Temp Source: Oral (06/07 2000) BP: 151/80 mmHg (06/07 2000) Pulse Rate: 70 (06/07 2000) Intake/Output from previous day: 06/06 0701 - 06/07 0700 In: 2766.4 [I.V.:2216.4; IV Piggyback:550] Out: 3020 [Urine:520] Intake/Output from this shift: Total I/O In: 30.7 [I.V.:30.7] Out: -   Labs:  Recent Labs  09/29/14 0013 09/29/14 2301 09/30/14 0510 09/30/14 1303  WBC 12.2*  --  13.1* 14.7*  HGB 10.8*  --  11.3* 12.8*  PLT 172  --  184 177  CREATININE 7.82* 4.65*  --  3.12*   Estimated Creatinine Clearance: 30.2 mL/min (by C-G formula based on Cr of 3.12).  Assessment: 49yom continues on day #7 vancomycin and zosyn for bilateral LE cellulitis/sepsis. Renal on board for acute on chronic renal failure (stage IV/V).  Vanc 6/1>> * Doses: 1750 mg total on 6/1, 1g given 6/3 @ 2349, 1g given 6/6 @ 0208 * HD sessions: 6/5 (2.5 hr BFR 200), 6/6 (3 hr BFR 300), 6/7 (3 hr BFR 300) Zosyn 6/1>>  A vancomycin random level was checked this evening- it was drawn ~5h after HD ended, so it is a true equilibrium level. Resulted at 327mcg/mL which is above post-HD goal of 5-1215mcg/mL.  Patient is to have HD again tomorrow morning. Patient did have 1135mL of urine output in the last 24h which is an improvement.  Goal of Therapy:  pre HD vancomycin level 15-25  Plan:  - Continue zosyn 2.25g IV q8 - follow up tolerance of HD session tomorrow. If patient tolerates closer to a full HD session (3.5-4h with BFR of 350-47200mL/min), would estimate level to be ~5215mcg/mL and patient could receive another dose of vancomycin pending the rest of his clinical picture. If he does not tolerate a  full session, level would likely still be elevated. - f/u c/s, clinical progression, renal function, levels PRN, LOT  Makisha Marrin D. Ronon Ferger, PharmD, BCPS Clinical Pharmacist Pager: [phone] 09/30/2014 8:44 PM

## 2014-10-01 ENCOUNTER — Encounter (HOSPITAL_COMMUNITY): Payer: Self-pay | Admitting: Anesthesiology

## 2014-10-01 ENCOUNTER — Inpatient Hospital Stay (HOSPITAL_COMMUNITY): Payer: Medicare Other | Admitting: Anesthesiology

## 2014-10-01 ENCOUNTER — Inpatient Hospital Stay (HOSPITAL_COMMUNITY): Payer: Medicare Other

## 2014-10-01 ENCOUNTER — Encounter (HOSPITAL_COMMUNITY)
Admission: EM | Disposition: A | Discharge status: Skilled Nursing Facility | Payer: Self-pay | Source: Home / Self Care | Attending: Internal Medicine

## 2014-10-01 DIAGNOSIS — I472 Ventricular tachycardia: Secondary | ICD-10-CM

## 2014-10-01 DIAGNOSIS — I255 Ischemic cardiomyopathy: Secondary | ICD-10-CM

## 2014-10-01 DIAGNOSIS — I4891 Unspecified atrial fibrillation: Secondary | ICD-10-CM

## 2014-10-01 DIAGNOSIS — I739 Peripheral vascular disease, unspecified: Secondary | ICD-10-CM

## 2014-10-01 HISTORY — PX: INSERTION OF DIALYSIS CATHETER: SHX1324

## 2014-10-01 LAB — CULTURE, BLOOD (ROUTINE X 2)
CULTURE: NO GROWTH
CULTURE: NO GROWTH

## 2014-10-01 LAB — CBC
HCT: 35.3 % — ABNORMAL LOW (ref 39.0–52.0)
Hemoglobin: 11.9 g/dL — ABNORMAL LOW (ref 13.0–17.0)
MCH: 29 pg (ref 26.0–34.0)
MCHC: 33.7 g/dL (ref 30.0–36.0)
MCV: 86.1 fL (ref 78.0–100.0)
Platelets: 165 10*3/uL (ref 150–400)
RBC: 4.1 MIL/uL — ABNORMAL LOW (ref 4.22–5.81)
RDW: 15.2 % (ref 11.5–15.5)
WBC: 13.5 10*3/uL — ABNORMAL HIGH (ref 4.0–10.5)

## 2014-10-01 LAB — GLUCOSE, CAPILLARY
GLUCOSE-CAPILLARY: 119 mg/dL — AB (ref 65–99)
GLUCOSE-CAPILLARY: 127 mg/dL — AB (ref 65–99)
Glucose-Capillary: 123 mg/dL — ABNORMAL HIGH (ref 65–99)
Glucose-Capillary: 134 mg/dL — ABNORMAL HIGH (ref 65–99)
Glucose-Capillary: 142 mg/dL — ABNORMAL HIGH (ref 65–99)
Glucose-Capillary: 145 mg/dL — ABNORMAL HIGH (ref 65–99)

## 2014-10-01 LAB — BASIC METABOLIC PANEL
Anion gap: 21 — ABNORMAL HIGH (ref 5–15)
BUN: 47 mg/dL — ABNORMAL HIGH (ref 6–20)
CHLORIDE: 103 mmol/L (ref 101–111)
CO2: 15 mmol/L — ABNORMAL LOW (ref 22–32)
Calcium: 8.3 mg/dL — ABNORMAL LOW (ref 8.9–10.3)
Creatinine, Ser: 3.96 mg/dL — ABNORMAL HIGH (ref 0.61–1.24)
GFR calc Af Amer: 19 mL/min — ABNORMAL LOW (ref >60)
GFR calc non Af Amer: 16 mL/min — ABNORMAL LOW (ref >60)
Glucose, Bld: 127 mg/dL — ABNORMAL HIGH (ref 65–99)
Potassium: 4.6 mmol/L (ref 3.5–5.1)
SODIUM: 139 mmol/L (ref 135–145)

## 2014-10-01 LAB — HEPARIN LEVEL (UNFRACTIONATED): Heparin Unfractionated: 0.13 IU/mL — ABNORMAL LOW (ref 0.30–0.70)

## 2014-10-01 LAB — PROTIME-INR
INR: 1.34 (ref 0.00–1.49)
PROTHROMBIN TIME: 16.7 s — AB (ref 11.6–15.2)

## 2014-10-01 SURGERY — INSERTION OF DIALYSIS CATHETER
Anesthesia: Monitor Anesthesia Care | Site: Neck

## 2014-10-01 SURGERY — INSERTION OF DIALYSIS CATHETER
Anesthesia: General | Site: Neck | Laterality: Right

## 2014-10-01 MED ORDER — HEPARIN SODIUM (PORCINE) 1000 UNIT/ML IJ SOLN
INTRAMUSCULAR | Status: DC | PRN
  Administered 2014-10-01: 4.6 mL via INTRAVENOUS

## 2014-10-01 MED ORDER — HEPARIN (PORCINE) IN NACL 100-0.45 UNIT/ML-% IJ SOLN
1700.0000 [IU]/h | INTRAMUSCULAR | Status: DC
  Filled 2014-10-01: qty 250

## 2014-10-01 MED ORDER — HEPARIN SODIUM (PORCINE) 1000 UNIT/ML IJ SOLN
INTRAMUSCULAR | Status: AC
  Filled 2014-10-01: qty 1

## 2014-10-01 MED ORDER — SODIUM CHLORIDE 0.9 % IV SOLN
INTRAVENOUS | Status: DC
  Administered 2014-10-01: 10:00:00 via INTRAVENOUS

## 2014-10-01 MED ORDER — FENTANYL CITRATE (PF) 250 MCG/5ML IJ SOLN
INTRAMUSCULAR | Status: AC
  Filled 2014-10-01: qty 5

## 2014-10-01 MED ORDER — PHENYLEPHRINE HCL 10 MG/ML IJ SOLN
10.0000 mg | INTRAVENOUS | Status: DC | PRN
  Administered 2014-10-01: 100 ug/min via INTRAVENOUS

## 2014-10-01 MED ORDER — PHENYLEPHRINE HCL 10 MG/ML IJ SOLN
INTRAMUSCULAR | Status: DC | PRN
  Administered 2014-10-01: 80 ug via INTRAVENOUS

## 2014-10-01 MED ORDER — PROPOFOL 10 MG/ML IV BOLUS
INTRAVENOUS | Status: DC | PRN
  Administered 2014-10-01: 150 mg via INTRAVENOUS

## 2014-10-01 MED ORDER — FENTANYL CITRATE (PF) 100 MCG/2ML IJ SOLN: 25.0000 ug | INTRAMUSCULAR | Status: DC | PRN

## 2014-10-01 MED ORDER — LORAZEPAM 2 MG/ML IJ SOLN
INTRAMUSCULAR | Status: AC
  Filled 2014-10-01: qty 1

## 2014-10-01 MED ORDER — LIDOCAINE-EPINEPHRINE 0.5 %-1:200000 IJ SOLN
INTRAMUSCULAR | Status: AC
  Filled 2014-10-01: qty 1

## 2014-10-01 MED ORDER — HEPARIN (PORCINE) IN NACL 100-0.45 UNIT/ML-% IJ SOLN
1800.0000 [IU]/h | INTRAMUSCULAR | Status: DC
  Administered 2014-10-01: 1700 [IU]/h via INTRAVENOUS
  Administered 2014-10-02 – 2014-10-03 (×2): 1550 [IU]/h via INTRAVENOUS
  Administered 2014-10-04 (×2): 1650 [IU]/h via INTRAVENOUS
  Administered 2014-10-05 – 2014-10-06 (×2): 1800 [IU]/h via INTRAVENOUS
  Filled 2014-10-01 (×18): qty 250

## 2014-10-01 MED ORDER — ONDANSETRON HCL 4 MG/2ML IJ SOLN
INTRAMUSCULAR | Status: DC | PRN
  Administered 2014-10-01: 4 mg via INTRAVENOUS

## 2014-10-01 MED ORDER — ONDANSETRON HCL 4 MG/2ML IJ SOLN: 4.0000 mg | Freq: Four times a day (QID) | INTRAMUSCULAR | Status: DC | PRN

## 2014-10-01 MED ORDER — HEPARIN BOLUS VIA INFUSION
2000.0000 [IU] | Freq: Once | INTRAVENOUS | Status: AC
  Administered 2014-10-01: 2000 [IU] via INTRAVENOUS
  Filled 2014-10-01: qty 2000

## 2014-10-01 MED ORDER — LORAZEPAM 2 MG/ML IJ SOLN
0.5000 mg | Freq: Once | INTRAMUSCULAR | Status: AC
  Administered 2014-10-01: 0.5 mg via INTRAVENOUS

## 2014-10-01 MED ORDER — LIDOCAINE HCL (CARDIAC) 20 MG/ML IV SOLN
INTRAVENOUS | Status: DC | PRN
  Administered 2014-10-01: 60 mg via INTRAVENOUS

## 2014-10-01 MED ORDER — OXYCODONE HCL 5 MG PO TABS: 5.0000 mg | ORAL_TABLET | Freq: Once | ORAL | Status: DC | PRN

## 2014-10-01 MED ORDER — SODIUM CHLORIDE 0.9 % IR SOLN
Status: DC | PRN
  Administered 2014-10-01: 500 mL

## 2014-10-01 MED ORDER — FENTANYL CITRATE (PF) 100 MCG/2ML IJ SOLN
INTRAMUSCULAR | Status: DC | PRN
  Administered 2014-10-01: 25 ug via INTRAVENOUS

## 2014-10-01 MED ORDER — MIDAZOLAM HCL 2 MG/2ML IJ SOLN
INTRAMUSCULAR | Status: AC
  Filled 2014-10-01: qty 2

## 2014-10-01 MED ORDER — SODIUM CHLORIDE 0.9 % IV SOLN
INTRAVENOUS | Status: DC | PRN
  Administered 2014-10-01: 10:00:00 via INTRAVENOUS

## 2014-10-01 MED ORDER — OXYCODONE HCL 5 MG/5ML PO SOLN: 5.0000 mg | Freq: Once | ORAL | Status: DC | PRN

## 2014-10-01 SURGICAL SUPPLY — 56 items
APL SKNCLS STERI-STRIP NONHPOA (GAUZE/BANDAGES/DRESSINGS)
BAG BANDED W/RUBBER/TAPE 36X54 (MISCELLANEOUS) ×2 IMPLANT
BAG DECANTER FOR FLEXI CONT (MISCELLANEOUS) ×1 IMPLANT
BAG EQP BAND 135X91 W/RBR TAPE (MISCELLANEOUS) ×1
BENZOIN TINCTURE PRP APPL 2/3 (GAUZE/BANDAGES/DRESSINGS) IMPLANT
BIOPATCH RED 1 DISK 7.0 (GAUZE/BANDAGES/DRESSINGS) ×2 IMPLANT
BIOPATCH RED 1IN DISK 7.0MM (GAUZE/BANDAGES/DRESSINGS) ×1
BIOPATCH WHT 1IN DISK W/4.0 H (GAUZE/BANDAGES/DRESSINGS) ×2 IMPLANT
CANNULA VESSEL 3MM 2 BLNT TIP (CANNULA) IMPLANT
CATH CANNON HEMO 15F 50CM (CATHETERS) IMPLANT
CATH CANNON HEMO 15FR 19 (HEMODIALYSIS SUPPLIES) IMPLANT
CATH CANNON HEMO 15FR 23CM (HEMODIALYSIS SUPPLIES) ×2 IMPLANT
CATH CANNON HEMO 15FR 31CM (HEMODIALYSIS SUPPLIES) IMPLANT
CATH CANNON HEMO 15FR 32 (HEMODIALYSIS SUPPLIES) IMPLANT
CATH CANNON HEMO 15FR 32CM (HEMODIALYSIS SUPPLIES) IMPLANT
CLIP LIGATING EXTRA MED SLVR (CLIP) IMPLANT
CLIP LIGATING EXTRA SM BLUE (MISCELLANEOUS) IMPLANT
CLOSURE WOUND 1/2 X4 (GAUZE/BANDAGES/DRESSINGS)
COVER DOME SNAP 22 D (MISCELLANEOUS) ×2 IMPLANT
COVER PROBE W GEL 5X96 (DRAPES) ×2 IMPLANT
DECANTER SPIKE VIAL GLASS SM (MISCELLANEOUS) ×1 IMPLANT
DRAPE C-ARM 42X72 X-RAY (DRAPES) ×1 IMPLANT
DRAPE CHEST BREAST 15X10 FENES (DRAPES) ×3 IMPLANT
DRSG TEGADERM 2-3/8X2-3/4 SM (GAUZE/BANDAGES/DRESSINGS) ×2 IMPLANT
ELECT CAUTERY BLADE 6.4 (BLADE) ×2 IMPLANT
GAUZE SPONGE 2X2 8PLY STRL LF (GAUZE/BANDAGES/DRESSINGS) ×1 IMPLANT
GAUZE SPONGE 4X4 16PLY XRAY LF (GAUZE/BANDAGES/DRESSINGS) ×1 IMPLANT
GLOVE SS BIOGEL STRL SZ 7.5 (GLOVE) ×1 IMPLANT
GLOVE SUPERSENSE BIOGEL SZ 7.5 (GLOVE) ×2
GOWN STRL REUS W/ TWL LRG LVL3 (GOWN DISPOSABLE) ×2 IMPLANT
GOWN STRL REUS W/TWL LRG LVL3 (GOWN DISPOSABLE) ×6
KIT BASIN OR (CUSTOM PROCEDURE TRAY) ×3 IMPLANT
KIT ROOM TURNOVER OR (KITS) ×3 IMPLANT
NDL 18GX1X1/2 (RX/OR ONLY) (NEEDLE) ×1 IMPLANT
NDL HYPO 25GX1X1/2 BEV (NEEDLE) ×1 IMPLANT
NEEDLE 18GX1X1/2 (RX/OR ONLY) (NEEDLE) ×3 IMPLANT
NEEDLE 22X1 1/2 (OR ONLY) (NEEDLE) ×1 IMPLANT
NEEDLE HYPO 25GX1X1/2 BEV (NEEDLE) ×3 IMPLANT
NS IRRIG 1000ML POUR BTL (IV SOLUTION) ×3 IMPLANT
PACK CV ACCESS (CUSTOM PROCEDURE TRAY) ×2 IMPLANT
PACK SURGICAL SETUP 50X90 (CUSTOM PROCEDURE TRAY) ×1 IMPLANT
PAD ARMBOARD 7.5X6 YLW CONV (MISCELLANEOUS) ×6 IMPLANT
SOAP 2 % CHG 4 OZ (WOUND CARE) ×3 IMPLANT
SPONGE GAUZE 2X2 STER 10/PKG (GAUZE/BANDAGES/DRESSINGS) ×2
STRIP CLOSURE SKIN 1/2X4 (GAUZE/BANDAGES/DRESSINGS) IMPLANT
SUT ETHILON 3 0 PS 1 (SUTURE) ×3 IMPLANT
SUT PROLENE 6 0 CC (SUTURE) IMPLANT
SUT VIC AB 3-0 SH 27 (SUTURE)
SUT VIC AB 3-0 SH 27X BRD (SUTURE) IMPLANT
SUT VICRYL 4-0 PS2 18IN ABS (SUTURE) ×3 IMPLANT
SYR 20CC LL (SYRINGE) ×1 IMPLANT
SYR 5ML LL (SYRINGE) ×2 IMPLANT
SYR CONTROL 10ML LL (SYRINGE) ×1 IMPLANT
SYRINGE 10CC LL (SYRINGE) ×3 IMPLANT
TAPE CLOTH SURG 4X10 WHT LF (GAUZE/BANDAGES/DRESSINGS) ×2 IMPLANT
WATER STERILE IRR 1000ML POUR (IV SOLUTION) ×1 IMPLANT

## 2014-10-01 NOTE — Progress Notes (Signed)
Patient ID: [name], male   DOB: June 10, 1965, 49 y.o.   MRN: [id number] I received a call from the patient's nurse. Patient is unable to give consent. The sister who is acting as Clinical research associatemedical decision maker is questioning the decision to proceed with hemodialysis and is not willing to sign. Will defer this discussion to the medical and renal service. Hemodialysis catheter is canceled for today. Please reconsult if the decision is made to proceed with hemodialysis.

## 2014-10-01 NOTE — Op Note (Signed)
    OPERATIVE REPORT  DATE OF SURGERY: 10/01/2014  PATIENT: [name], 49 y.o. male MRN: [id number]  DOB: [birth year]  PRE-OPERATIVE DIAGNOSIS: End-stage renal disease  POST-OPERATIVE DIAGNOSIS:  Same  PROCEDURE: Right IJ hemodialysis catheter with SonoSite visualization  SURGEON:  [name], M.D.  PHYSICIAN ASSISTANT: Nurse  ANESTHESIA:  Gen.  EBL: Minimal ml  Total I/O In: 50.4 [I.V.:50.4] Out: -   BLOOD ADMINISTERED: None  DRAINS: None  SPECIMEN: None  COUNTS CORRECT:  YES  PLAN OF CARE: PACU with chest x-ray pending   PATIENT DISPOSITION:  PACU - hemodynamically stable  PROCEDURE DETAILS: The patient was taken to the operative placed supine position where the area of the right and left neck were prepped and draped in sterile fashion. Using SonoSite ultrasound and a singlewall puncture with an 18-gauge needle the right internal jugular vein was entered. A guidewire was passed down the internal jugular vein to the level of the right atrium and this was confirmed with fluoroscopy. A dilator and peel-away sheath was passed over the guidewire. Dilator and guidewire removed. A 23 cm hemodialysis cath was passed through the sheath and the sheath was removed. The catheter was brought through subcutaneous tunnel through a separate stab incision. The 2 lm ports were attached and both lumens flushed and aspirated easily. The catheter was secured to the skin with 3-0 nylon stitch and the site was closed with 3-0 subcuticular Bartle stitch and a 3-0 nylon stitch. Sterile dressing was applied and transferred to the recovery room stable condition. Both lumens were locked with 1000 unit per cc heparin.   [name], M.D. 10/01/2014 11:14 AM

## 2014-10-01 NOTE — Interval H&P Note (Signed)
History and Physical Interval Note:  10/01/2014 9:36 AM  [name]  has presented today for surgery, with the diagnosis of Renal Disease  The various methods of treatment have been discussed with the patient and family. After consideration of risks, benefits and other options for treatment, the patient has consented to  Procedure(s): INSERTION OF DIALYSIS CATHETER (N/A) as a surgical intervention .  The patient's history has been reviewed, patient examined, no change in status, stable for surgery.  I have reviewed the patient's chart and labs.  Questions were answered to the patient's satisfaction.     Gretta BeganEarly, Todd

## 2014-10-01 NOTE — Progress Notes (Signed)
OT Cancellation Note  Patient Details Name: [name] MRN: [id number] DOB: 1965-11-21   Cancelled Treatment:     Pt remains with femoral dialysis catheter  Evette GeorgesLeonard, [name] 10/01/2014, 7:27 AM

## 2014-10-01 NOTE — Progress Notes (Signed)
Nutrition Follow-up  DOCUMENTATION CODES:  Severe malnutrition in context of chronic illness  INTERVENTION:  If unable to pass swallow evaluation recommend consider enteral nutrition support.   If unable to gain access enterally agree with recommendation for parenteral nutrition support as pt is severely malnourished.   NUTRITION DIAGNOSIS:  Malnutrition related to chronic illness as evidenced by severe depletion of body fat, severe depletion of muscle mass.  ongong  GOAL:  Patient will meet greater than or equal to 90% of their needs  Not met   MONITOR:  Diet advancement, Labs, Weight trends, Skin, I & O's  REASON FOR ASSESSMENT:  NPO/Clear Liquid Diet    ASSESSMENT:  Hx of uncontrolled DM, CHF, severe PAD, CAD s/p CABG, CKD IV, and ETOH abuse admitted with right leg pain/sweeling. Pt sepitc and started on IV abx, course complicated with persistent encephalopathy and worsening renal fx.  Pt now ESRD and started on HD 6/5.  Pt has remained NPO x 7 days due to inability to pass swallow evaluation.   Labs reviewed: BUN/Cr elevated; cbg's: 114-142 Medications reviewed and include: folic acid, thiamine  Pt discussed during ICU rounds and with RN.  Per RN hopeful for pt to get to eat today when he returns from his HD access procedure. Pt more alert today, SLP to visit after procedure.  Height:  Ht Readings from Last 1 Encounters:  09/25/14 5' 11"  (1.803 m)    Weight:  Wt Readings from Last 1 Encounters:  10/01/14 169 lb 8.5 oz (76.9 kg)    Ideal Body Weight:  78 kg  Wt Readings from Last 10 Encounters:  10/01/14 169 lb 8.5 oz (76.9 kg)  08/07/14 194 lb 11.2 oz (88.315 kg)  02/10/14 200 lb 9.6 oz (90.992 kg)  11/07/13 185 lb 9.6 oz (84.188 kg)  10/01/13 189 lb (85.73 kg)  05/09/13 196 lb 3.2 oz (88.996 kg)  11/19/12 201 lb 9.6 oz (91.445 kg)  03/23/11 202 lb 2.6 oz (91.7 kg)    BMI:  Body mass index is 23.66 kg/(m^2).  Estimated Nutritional  Needs:  Kcal:  2100 - 2300  Protein:  100 - 115 g  Fluid:  per MD  Skin:  Wound (see comment) (cellulitis of right leg, stage II)  Diet Order:  Diet NPO time specified Except for: Sips with Meds  EDUCATION NEEDS:  Education needs no appropriate at this time   Intake/Output Summary (Last 24 hours) at 10/01/14 0950 Last data filed at 10/01/14 0900  Gross per 24 hour  Intake 892.33 ml  Output   2210 ml  Net -1317.67 ml    Last BM:  6/7  Maylon Peppers RD, [place], [place] Pager [phone] After Hours Pager

## 2014-10-01 NOTE — Progress Notes (Signed)
SLP Cancellation Note  Patient Details Name: [name] MRN: [id number] DOB: 1965/11/11   Cancelled treatment:       Reason Eval/Treat Not Completed: Patient at procedure or test/unavailable   Maxcine HamLaura Paiewonsky, M.A. CCC-SLP [phone]  Maxcine Hamaiewonsky, Isadore Bokhari 10/01/2014, 10:06 AM

## 2014-10-01 NOTE — Anesthesia Postprocedure Evaluation (Signed)
Anesthesia Post Note  Patient: [name]  Procedure(s) Performed: Procedure(s) (LRB): INSERTION OF RIGHT INTERNAL JUGULAR DIALYSIS CATHETER (Right)  Anesthesia type: General  Patient location: PACU  Post pain: Pain level controlled and Adequate analgesia  Post assessment: Post-op Vital signs reviewed, Patient's Cardiovascular Status Stable, Respiratory Function Stable, Patent Airway and Pain level controlled  Last Vitals:  Filed Vitals:   10/01/14 1230  BP: 135/67  Pulse: 70  Temp:   Resp: 17    Post vital signs: Reviewed and stable  Level of consciousness: awake, alert  and oriented  Complications: No apparent anesthesia complications

## 2014-10-01 NOTE — Progress Notes (Signed)
S: Denies pain O:BP 163/101 mmHg  Pulse 76  Temp(Src) 97.9 F (36.6 C) (Oral)  Resp 15  Ht 5\' 11"  (1.803 m)  Wt 76.9 kg (169 lb 8.5 oz)  BMI 23.66 kg/m2  SpO2 100%  Intake/Output Summary (Last 24 hours) at 10/01/14 0906 Last data filed at 10/01/14 0800  Gross per 24 hour  Intake 875.63 ml  Output   2210 ml  Net -1334.37 ml   Weight change: -2 kg (-4 lb 6.6 oz) ZOX:WRUEAGen:awake and alert CVS:RRR Resp: decreased BS bases Abd: + BS NTND Ext: + edema, mostly in thighs.  Cellulitis lower ext.  Lt fem HD cath NEURO: Ox1 but clearly more alert and talkative   . antiseptic oral rinse  7 mL Mouth Rinse q12n4p  . chlorhexidine  15 mL Mouth Rinse BID  . ferumoxytol  510 mg Intravenous Q Tue-HD  . folic acid  1 mg Intravenous Daily  . hydrALAZINE  10 mg Intravenous 6 times per day  . insulin aspart  0-9 Units Subcutaneous 6 times per day  . metoprolol  5 mg Intravenous 4 times per day  . pantoprazole (PROTONIX) IV  40 mg Intravenous Q24H  . piperacillin-tazobactam (ZOSYN)  IV  2.25 g Intravenous 3 times per day  . sodium chloride  3 mL Intravenous Q12H  . thiamine  100 mg Intravenous Daily   No results found. BMET    Component Value Date/Time   NA 139 10/01/2014 0250   K 4.6 10/01/2014 0250   CL 103 10/01/2014 0250   CO2 15* 10/01/2014 0250   GLUCOSE 127* 10/01/2014 0250   BUN 47* 10/01/2014 0250   CREATININE 3.96* 10/01/2014 0250   CALCIUM 8.3* 10/01/2014 0250   GFRNONAA 16* 10/01/2014 0250   GFRAA 19* 10/01/2014 0250   CBC    Component Value Date/Time   WBC 13.5* 10/01/2014 0250   RBC 4.10* 10/01/2014 0250   HGB 11.9* 10/01/2014 0250   HCT 35.3* 10/01/2014 0250   PLT 165 10/01/2014 0250   MCV 86.1 10/01/2014 0250   MCH 29.0 10/01/2014 0250   MCHC 33.7 10/01/2014 0250   RDW 15.2 10/01/2014 0250   LYMPHSABS 0.8 09/29/2014 0013   MONOABS 0.2 09/29/2014 0013   EOSABS 0.0 09/29/2014 0013   BASOSABS 0.0 09/29/2014 0013     Assessment: 1.  New ESRD 2. Anemia 3.  DM 4. Cardiomyopathy EF 20%   Plan: 1. His mental status has improved but not at baseline yet.  I spoke with his sister who is making medical decisions and plan is to proceed with PC and cont with HD for now.  If his functional status does not improve or if he were to need an amputation (which she says will not be done) then would stop HD and place in hospice but for now will move forward and see how he does. 2. For PC today 3. Plan HD in AM Roselynn Whitacre T

## 2014-10-01 NOTE — Progress Notes (Signed)
ANTICOAGULATION CONSULT NOTE - Follow Up Consult  Pharmacy Consult for heparin Indication: atrial fibrillation  Labs:  Recent Labs  09/29/14 2301 09/30/14 0110 09/30/14 0510 09/30/14 0930 09/30/14 1303 09/30/14 1830 10/01/14 0250  HGB  --   --  11.3*  --  12.8*  --  11.9*  HCT  --   --  35.2*  --  39.1  --  35.3*  PLT  --   --  184  --  177  --  165  LABPROT  --   --   --   --   --   --  16.7*  INR  --   --   --   --   --   --  1.34  HEPARINUNFRC  --   --   --  0.13*  --  0.32 0.13*  CREATININE 4.65*  --   --   --  3.12*  --  3.96*  TROPONINI 0.07* 0.07* 0.10*  --  0.11*  --   --      Assessment: 49yo male now subtherapeutic on heparin despite rate increase for level at low end of goal; RN notes pt lost IV access many hours ago but has been running fine since.  Goal of Therapy:  Heparin level 0.3-0.7 units/ml   Plan:  Will rebolus with heparin 2000 units IV bolus and increase gtt by 2-3 units/kg/hr to 1700 units/hr and check level in 8hr.  [name] GamblesVeronda Arielis Green, PharmD, BCPS  10/01/2014,3:53 AM

## 2014-10-01 NOTE — Anesthesia Procedure Notes (Signed)
Procedure Name: LMA Insertion Date/Time: 10/01/2014 10:36 AM Performed by: Leonel Ramsay'LAUGHLIN, Otelia Hettinger H Pre-anesthesia Checklist: Patient identified, Emergency Drugs available, Suction available, Patient being monitored and Timeout performed Patient Re-evaluated:Patient Re-evaluated prior to inductionOxygen Delivery Method: Circle system utilized Preoxygenation: Pre-oxygenation with 100% oxygen Intubation Type: IV induction LMA: LMA inserted LMA Size: 4.0 Number of attempts: 1 Placement Confirmation: positive ETCO2 and breath sounds checked- equal and bilateral Tube secured with: Tape Dental Injury: Teeth and Oropharynx as per pre-operative assessment

## 2014-10-01 NOTE — Progress Notes (Signed)
Pt admitted to pacu with amiodarone gtt infusing, lt mitt in place

## 2014-10-01 NOTE — H&P (View-Only) (Signed)
Vascular and Vein [facility] Consult  Reason for Consult:  New access Referring Physician:  Dr. Allena Katz MRN #:  [id number]  History of Present Illness: This is a 49 y.o. male who we've been consulted on regarding permanent access. The patient is difficult to arouse and unable to provide a history. He is intermittently moaning. He does respond that he is right handed. The remaining history is gathered from the chart. Per RN, his mother who was present is unable to provide much information about his history.  He presented to the [facility] ED on 09/24/14 with right leg swelling and pain for the previous several days. He had difficulty walking over the past 2 days secondary to the pain.   He has a PMH of CAD s/p CABG in 2010, diabetes mellitus 2, CHF, HTN and CKD stage IV.   Past Medical History  Diagnosis Date  . Coronary artery disease 07/05/2011    2D ECHO - EF ~40%, moderate concentric LV hypertrophy, LA moderately dilated, mild-moderate septal and inferior wall hypokinesis  . Hypertension   . CHF (congestive heart failure)   . Diabetes mellitus     Insulin dependent  . Cardiomyopathy   . Chronic kidney disease (CKD), stage IV (severe)   . Claudication 05/09/2011    Right and left anterior tibial arteries and Left SFA-occluded; Right CIA-<50% diameter reduction; Right Deep Profunda-70-99% diameter reduction; Right SFA->60% diameter reduction; Right Distal Popliteal/Tibial Artery- >60% diameter reduction; Left CFA and Profunda- >50% diameter reduction  . 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 GERD (gastroesophageal reflux disease)    Past Surgical History  Procedure Laterality Date  . Cardiac catheterization  05/07/2008    CABG  . Coronary artery bypass graft  06/2008    x3    No Known  Allergies  Prior to Admission medications   Medication Sig Start Date End Date Taking? Authorizing Provider  calcium carbonate (TUMS EX) 750 MG chewable tablet Chew 1 tablet by mouth as needed for heartburn.   Yes Historical Provider, MD  Ferrous Sulfate (IRON) 325 (65 FE) MG TABS Take 325 mg by mouth daily.   Yes Historical Provider, MD  furosemide (LASIX) 80 MG tablet Take 1 tablet (80 mg total) by mouth daily. 02/10/14  Yes Mihai Croitoru, MD  hydrALAZINE (APRESOLINE) 50 MG tablet Take 1 tablet (50 mg total) by mouth every 8 (eight) hours. 11/07/13  Yes Mihai Croitoru, MD  isosorbide mononitrate (IMDUR) 60 MG 24 hr tablet Take 1 tablet (60 mg total) by mouth daily. 03/17/14  Yes Mihai Croitoru, MD  LEVEMIR 100 UNIT/ML injection Inject 5 Units into the skin daily before breakfast. 05/12/14  Yes Historical Provider, MD  metoprolol (LOPRESSOR) 50 MG tablet Take 1 tablet (50 mg total) by mouth 2 (two) times daily. 03/13/13  Yes Mihai Croitoru, MD  Oxycodone HCl 10 MG TABS Take 1 tablet by mouth every 6 (six) hours as needed. For pain 07/16/14  Yes Historical Provider, MD  pantoprazole (PROTONIX) 40 MG tablet Take 40 mg by mouth daily.  05/08/13  Yes Historical Provider, MD  sodium bicarbonate 650 MG tablet Take 650 mg by mouth 2 (two) times daily.   Yes Historical Provider, MD  acyclovir (ZOVIRAX) 800 MG tablet Take 1 tablet (800 mg total) by mouth 5 (five) times daily. Patient not taking:  Reported on 09/24/2014 03/11/14   Gilda Creasehristopher J Pollina, MD  nitroGLYCERIN (NITROSTAT) 0.4 MG SL tablet Place 1 tablet (0.4 mg total) under the tongue once. Patient taking differently: Place 0.4 mg under the tongue every 5 (five) minutes as needed for chest pain.  10/01/13   Thurmon FairMihai Croitoru, MD    History   Social History  . Marital Status: Single    Spouse Name: N/A  . Number of Children: N/A  . Years of Education: N/A   Occupational History  . Not on file.   Social History Main Topics  . Smoking status:  Former Smoker -- 0.50 packs/day for 25 years    Types: Cigarettes    Quit date: 09/03/2013  . Smokeless tobacco: Not on file  . Alcohol Use: Yes     Comment: occas.  . Drug Use: Yes    Special: Marijuana  . Sexual Activity: Yes   Other Topics Concern  . Not on file   Social History Narrative    No family history on file.  ROS: [x]  Positive   [ ]  Negative   [ ]  All sytems reviewed and are negative Patient unable to provide ROS.   Cardiovascular: []  chest pain/pressure []  palpitations []  SOB lying flat []  DOE []  pain in legs while walking []  pain in legs at rest []  pain in legs at night []  non-healing ulcers []  hx of DVT []  swelling in legs  Pulmonary: []  productive cough []  asthma/wheezing []  home O2  Neurologic: []  weakness in []  arms []  legs []  numbness in []  arms []  legs []  hx of CVA []  mini stroke [] difficulty speaking or slurred speech []  temporary loss of vision in one eye []  dizziness  Hematologic: []  hx of cancer []  bleeding problems []  problems with blood clotting easily  Endocrine:   []  diabetes []  thyroid disease  GI []  vomiting blood []  blood in stool  GU: [x]  CKD/renal failure []  HD--[]  M/W/F or []  T/T/S []  burning with urination []  blood in urine  Psychiatric: []  anxiety []  depression  Musculoskeletal: []  arthritis []  joint pain  Integumentary: []  rashes []  ulcers  Constitutional: []  fever []  chills   Physical Examination  Filed Vitals:   09/29/14 1100  BP: 145/76  Pulse:   Temp: 97.9 F (36.6 C)  Resp: 17   Body mass index is 25.1 kg/(m^2).  General:  AxO x 0, ill appearing male, not following commands, is moaning, randomly answers some questions. Gait: Not observed HENT: WNL, normocephalic Pulmonary: normal non-labored breathing, without Rales, rhonchi,  wheezing Cardiac: regular, without  Murmurs, rubs or gallops Abdomen: soft, NT/ND  Vascular Exam/Pulses: Non palpable radial and ulnar pulses. Palpable  left brachial pulse. 2+ right femoral pulse. Left femoral temporary catheter in place. Left monophasic peroneal doppler signal, dampened left DP, monophasic-biphasic left PT, right PT brisk biphasic. Non dopplerable right peroneal and dorsalis pedis.  Extremities: right leg cellulitis with superficial ulcerations and dry black eschar to anterior shin and dorsum of left foot. No drainage. Small dry gangrenous ulcer to right lateral distal foot edge. Left leg with large superficial ulcer on anterior shin.  Musculoskeletal: no muscle wasting or atrophy  Neurologic: A&O X 0; Appropriate Affect ; SENSATION: normal  CBC    Component Value Date/Time   WBC 12.2* 09/29/2014 0013   RBC 3.78* 09/29/2014 0013   HGB 10.8* 09/29/2014 0013   HCT 33.5* 09/29/2014 0013   PLT 172 09/29/2014 0013   MCV 88.6 09/29/2014 0013   MCH 28.6  09/29/2014 0013   MCHC 32.2 09/29/2014 0013   RDW 15.2 09/29/2014 0013   LYMPHSABS 0.8 09/29/2014 0013   MONOABS 0.2 09/29/2014 0013   EOSABS 0.0 09/29/2014 0013   BASOSABS 0.0 09/29/2014 0013    BMET    Component Value Date/Time   NA 144 09/29/2014 0013   K 3.9 09/29/2014 0013   CL 103 09/29/2014 0013   CO2 19* 09/29/2014 0013   GLUCOSE 109* 09/29/2014 0013   BUN 144* 09/29/2014 0013   CREATININE 7.82* 09/29/2014 0013   CALCIUM 8.1* 09/29/2014 0013   GFRNONAA 7* 09/29/2014 0013   GFRAA 8* 09/29/2014 0013    COAGS: Lab Results  Component Value Date   INR 1.45 09/27/2014   INR 1.39 09/24/2014   INR 0.91 03/19/2011    Statin:  No. Beta Blocker:  Yes.   Aspirin:  No. ACEI:  No. ARB:  No. Other antiplatelets/anticoagulants:  No.   ASSESSMENT: This is a 49 y.o. male with new ESRD now on HD. Other active problems: Bilateral lower leg cellulitis, PAD,  metabolic encephalopathy multifactorial: uremia, worsening cardiac function, sedating medications, sepsis, chronic systolic CHF, HTN.   PLAN: The patient is right handed. Vein mapping showing small left  cephalic and marginal basilic vein conduits. Right arm with IV in antecubital fossa and marginal cephalic and basilic vein conduits. Will defer choice of access conduit and timing to Dr. Darrick Penna. Patient currently dialyzing with left femoral temp cath. He has bilateral superficial leg ulcers and cellulitis, right worse than left. He has a history of PAD. He does have left peroneal, DP and PT doppler signals and right PT doppler signal. No current plans for workup at this point. Blood cultures NGTD. Continue empiric antibiotics.   Maris Berger, PA-C [facility] Office: [phone] Pager: [phone]  Agree with above.  2+ brachial pulses no radial pulse. Leg ulcers with abnormal pulse exam  Will check ABIs Pt needs to have clearer mental status before considering access.  Will follow up with him again in a few days.  Fabienne Bruns, MD [facility] Office: [phone] Pager: [phone]

## 2014-10-01 NOTE — Anesthesia Preprocedure Evaluation (Signed)
Anesthesia Evaluation  Patient identified by MRN, date of birth, ID band Patient confused    Reviewed: Allergy & Precautions, NPO status , Patient's Chart, lab work & pertinent test results, Unable to perform ROS - Chart review only  Airway Mallampati: II   Neck ROM: full    Dental   Pulmonary former smoker,  breath sounds clear to auscultation        Cardiovascular hypertension, + CAD, + Past MI, + CABG, + Peripheral Vascular Disease and +CHF Rhythm:regular Rate:Normal     Neuro/Psych    GI/Hepatic GERD-  ,  Endo/Other  diabetes, Type 2, Insulin Dependent  Renal/GU ESRFRenal disease     Musculoskeletal   Abdominal   Peds  Hematology   Anesthesia Other Findings   Reproductive/Obstetrics                             Anesthesia Physical Anesthesia Plan  ASA: IV  Anesthesia Plan: General   Post-op Pain Management:    Induction: Intravenous  Airway Management Planned: LMA  Additional Equipment:   Intra-op Plan:   Post-operative Plan:   Informed Consent: I have reviewed the patients History and Physical, chart, labs and discussed the procedure including the risks, benefits and alternatives for the proposed anesthesia with the patient or authorized representative who has indicated his/her understanding and acceptance.     Plan Discussed with: CRNA, Anesthesiologist and Surgeon  Anesthesia Plan Comments:         Anesthesia Quick Evaluation

## 2014-10-01 NOTE — Transfer of Care (Signed)
Immediate Anesthesia Transfer of Care Note  Patient: [name]  Procedure(s) Performed: Procedure(s): INSERTION OF RIGHT INTERNAL JUGULAR DIALYSIS CATHETER (Right)  Patient Location: PACU  Anesthesia Type:General  Level of Consciousness: awake and alert   Airway & Oxygen Therapy: Patient Spontanous Breathing and Patient connected to nasal cannula oxygen  Post-op Assessment: Report given to RN and Post -op Vital signs reviewed and stable  Post vital signs: Reviewed and stable  Last Vitals:  Filed Vitals:   10/01/14 0900  BP: 161/66  Pulse: 75  Temp:   Resp: 20    Complications: No apparent anesthesia complications

## 2014-10-01 NOTE — Progress Notes (Signed)
Speech Language Pathology Treatment: Dysphagia  Patient Details Name: [name] MRN: [id number] DOB: 1965-05-23 Today's Date: 10/01/2014 Time: [id number] SLP Time Calculation (min) (ACUTE ONLY): 15 min  Assessment / Plan / Recommendation Clinical Impression  Pt more alert today although still with altered mentation. He consumed thin liquids and purees without incidence, although does require Mod cues for oral clearance with regular solids due to decreased awareness and sustained attention. Recommend initiating Dys 2 diet and thin liquids with full supervision. SLP to f/u for tolerance and advancement of solid textures as mentation clears.   HPI Other Pertinent Information: 49 y.o. male with PMH of hypertension, hyperlipidemia, diabetes mellitus, GERD, CAD(s/P of CABG 2010), combined systolic and diastolic congestive heart failure, chronic kidney disease-stage IV, severe PAD, substance abuse, who presents with right leg swelling and pain. CXR Stable mild cardiomegaly. Low lung volumes without acute findings. Found to have metabolic encephalopathy multifactorial to include uremia, worsening cardiac function, iatrogenic (sedating medication), bilateral lower leg cellulitis and sepsis.   Pertinent Vitals Pain Assessment: No/denies pain  SLP Plan  Goals updated    Recommendations Diet recommendations: Dysphagia 2 (fine chop);Thin liquid Liquids provided via: Cup;Straw Medication Administration: Whole meds with puree Supervision: Patient able to self feed;Full supervision/cueing for compensatory strategies Compensations: Slow rate;Small sips/bites;Check for pocketing Postural Changes and/or Swallow Maneuvers: Seated upright 90 degrees       Oral Care Recommendations: Oral care BID Follow up Recommendations:  (tba) Plan: Goals updated    Maxcine HamLaura Paiewonsky, M.A. CCC-SLP [phone]  Maxcine Hamaiewonsky, Liara Holm 10/01/2014, 4:05 PM

## 2014-10-01 NOTE — Consult Note (Signed)
ELECTROPHYSIOLOGY CONSULT NOTE    Patient ID: [name] MRN: [id number], DOB/AGE: 1965/08/14 49 y.o.  Admit date: 09/24/2014 Date of Consult: 10/01/2014  Primary Physician: [name], NP Primary Cardiologist: Croitoru  Reason for Consultation: WCT  HPI:  [name] is a 49 y.o. male with a past medical history significant for CAD s/p CABG, ESRD now requiring dialysis, chronic systolic heart failure, diabetes, prior substance abuse and hypertension who presented on the day of admission with right leg swelling and pain.  During his hospitalization, he has had altered mental status and has required dialysis initiation.  He has previously refused dialysis and ischemic evaluation had been deferred due to concerns about worsening kidney disease.  He is being treated for cellulitis and sepsis.  On telemetry, he has had atrial fibrillation with RVR and a wide complex tachycardia at 210bpm that was cardioverted last night.  He has been placed on amiodarone with no further tachycardia.  EP has been asked to evaluate for treatment options.  He is currently sedated post HD access placement and is unable to give ROS or family history. Per nursing, he has been confused and his family is helping to make decisions regarding his care.   Past Medical History  Diagnosis Date  . Coronary artery disease 07/05/2011    2D ECHO - EF ~40%, moderate concentric LV hypertrophy, LA moderately dilated, mild-moderate septal and inferior wall hypokinesis  . Hypertension   . CHF (congestive heart failure)   . Diabetes mellitus     Insulin dependent  . Cardiomyopathy   . Chronic kidney disease (CKD), stage IV (severe)   . Claudication 05/09/2011    Right and left anterior tibial arteries and Left SFA-occluded; Right CIA-<50% diameter reduction; Right Deep Profunda-70-99% diameter reduction; Right SFA->60% diameter reduction; Right Distal Popliteal/Tibial Artery- >60% diameter reduction; Left CFA and Profunda- >50%  diameter reduction  . 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 GERD (gastroesophageal reflux disease)      Surgical History:  Past Surgical History  Procedure Laterality Date  . Cardiac catheterization  05/07/2008    CABG  . Coronary artery bypass graft  06/2008    x3     Prescriptions prior to admission  Medication Sig Dispense Refill Last Dose  . calcium carbonate (TUMS EX) 750 MG chewable tablet Chew 1 tablet by mouth as needed for heartburn.   09/24/2014 at Unknown time  . Ferrous Sulfate (IRON) 325 (65 FE) MG TABS Take 325 mg by mouth daily.   09/24/2014 at Unknown time  . furosemide (LASIX) 80 MG tablet Take 1 tablet (80 mg total) by mouth daily. 30 tablet 5 09/24/2014 at Unknown time  . hydrALAZINE (APRESOLINE) 50 MG tablet Take 1 tablet (50 mg total) by mouth every 8 (eight) hours. 90 tablet 6 09/24/2014 at Unknown time  . isosorbide mononitrate (IMDUR) 60 MG 24 hr tablet Take 1 tablet (60 mg total) by mouth daily. 60 tablet 6 09/24/2014 at Unknown time  . LEVEMIR 100 UNIT/ML injection Inject 5 Units into the skin daily before breakfast.   09/24/2014 at Unknown time  . metoprolol (LOPRESSOR) 50 MG tablet Take 1 tablet (50 mg total) by mouth 2 (two) times daily. 60 tablet 9 09/24/2014 at Unknown time  . Oxycodone HCl 10 MG TABS Take 1 tablet by mouth every 6 (  six) hours as needed. For pain   09/24/2014 at Unknown time  . pantoprazole (PROTONIX) 40 MG tablet Take 40 mg by mouth daily.    09/24/2014 at Unknown time  . sodium bicarbonate 650 MG tablet Take 650 mg by mouth 2 (two) times daily.   09/24/2014 at Unknown time  . acyclovir (ZOVIRAX) 800 MG tablet Take 1 tablet (800 mg total) by mouth 5 (five) times daily. (Patient not taking: Reported on 09/24/2014) 56 tablet 0 Not Taking at Unknown time  . nitroGLYCERIN (NITROSTAT)  0.4 MG SL tablet Place 1 tablet (0.4 mg total) under the tongue once. (Patient taking differently: Place 0.4 mg under the tongue every 5 (five) minutes as needed for chest pain. ) 25 tablet 5 unknown at unknown    Inpatient Medications:  . [facility][MAR Hold] antiseptic oral rinse  7 mL Mouth Rinse q12n4p  . [MAR Hold] chlorhexidine  15 mL Mouth Rinse BID  . [MAR Hold] ferumoxytol  510 mg Intravenous Q Tue-HD  . [MAR Hold] folic acid  1 mg Intravenous Daily  . [MAR Hold] hydrALAZINE  10 mg Intravenous 6 times per day  . [MAR Hold] insulin aspart  0-9 Units Subcutaneous 6 times per day  . [MAR Hold] metoprolol  5 mg Intravenous 4 times per day  . [MAR Hold] pantoprazole (PROTONIX) IV  40 mg Intravenous Q24H  . [MAR Hold] piperacillin-tazobactam (ZOSYN)  IV  2.25 g Intravenous 3 times per day  . [MAR Hold] sodium chloride  3 mL Intravenous Q12H  . [MAR Hold] thiamine  100 mg Intravenous Daily    Allergies: No Known Allergies  History   Social History  . Marital Status: Single    Spouse Name: N/A  . Number of Children: N/A  . Years of Education: N/A   Occupational History  . Not on file.   Social History Main Topics  . Smoking status: Former Smoker -- 0.50 packs/day for 25 years    Types: Cigarettes    Quit date: 09/03/2013  . Smokeless tobacco: Not on file  . Alcohol Use: Yes     Comment: occas.  . Drug Use: Yes    Special: Marijuana  . Sexual Activity: Yes   Other Topics Concern  . Not on file   Social History Narrative     History reviewed. No pertinent family history. - pt unable to provide  Review of Systems: All other systems reviewed and are otherwise negative except as noted above.  Physical Exam: Filed Vitals:   10/01/14 0700 10/01/14 0730 10/01/14 0800 10/01/14 0900  BP: 152/69  163/101 161/66  Pulse: 71  76 75  Temp:  97.9 F (36.6 C)    TempSrc:  Oral    Resp: 20  15 20   Height:      Weight:      SpO2: 97%  100% 100%    GEN- The patient is chronically  ill appearing, confused HEENT: normocephalic, atraumatic; sclera clear, conjunctiva pink; hearing intact; oropharynx clear; neck supple, no JVP Lymph- no cervical lymphadenopathy Lungs- Clear to ausculation bilaterally, normal work of breathing.  No wheezes, rales, rhonchi Heart- Regular rate and rhythm, no murmurs, rubs or gallops  GI- soft, non-tender, non-distended, bowel sounds present  Extremities- bilateral LE ulcers  MS- no significant deformity or atrophy Skin- warm and dry, no rash or lesion    Labs:   Lab Results  Component Value Date   WBC 13.5* 10/01/2014   HGB 11.9* 10/01/2014   HCT 35.3* 10/01/2014  MCV 86.1 10/01/2014   PLT 165 10/01/2014    Recent Labs Lab 09/29/14 2301  10/01/14 0250  NA 140  < > 139  K 3.8  < > 4.6  CL 101  < > 103  CO2 17*  < > 15*  BUN 67*  < > 47*  CREATININE 4.65*  < > 3.96*  CALCIUM 8.2*  < > 8.3*  PROT 6.2*  --   --   BILITOT 4.2*  --   --   ALKPHOS 90  --   --   ALT 12*  --   --   AST 29  --   --   GLUCOSE 162*  < > 127*  < > = values in this interval not displayed.    Radiology/Studies:  Dg Chest Portable 1 View 09/27/2014   CLINICAL DATA:  Central line placement  EXAM: PORTABLE CHEST - 1 VIEW  COMPARISON:  09/24/2014  FINDINGS: There is a central line from an inferior approach which terminates in the right atrium. Mild pulmonary edema pattern. No pneumothorax.  IMPRESSION: Central venous line from a femoral approach with tip in the right atrium.   Electronically Signed   By: Genevive Bi M.D.   On: 09/27/2014 19:30   ZOX:WRUEA rhythm, rate 69, LBBB  TELEMETRY: AF with WCT at 210bpm last night that was cardioverted, some NSVT preceding tachycardia  Assessment/Plan: 1.  Wide complex tachycardia The patient developed a wide complex tachycardia last night that required cardioversion at a rate of 210bpm.   Continue IV amiodarone for now, can convert to PO tomorrow if rhythm remains stable and able to tolerate oral meds.  He  is not currently a candidate for EP procedures with altered mental status, sepsis  2.  Atrial fibrillation CHADS2VASC is at least 3 AF is exacerbated in the setting of acute illnesses, LA by echo 01/2014 was 48 (likelihood of recurrent AF is higher) Consider anticoagulation once other medical issues resolved  3.  Ischemic cardiomyopathy/chronic systolic heart failure Dr C had recommended further evaluation of coronary anatomy if patient pursued dialysis.  He is currently not a candidate for any invasive cardiac procedures.    EP to see as needed. Please call with questions.    Signed, Gypsy Balsam, NP 10/01/2014 11:09 AM  EP Attending  Patient seen and examined. I agree with the history, exam, lab and findings as noted by Gypsy Balsam, NP-C. He has limited options at this point and is quite sick. IV transitioning to amiodarone (400bid as HR allows) is most appropriate. Call us for questions. Prognosis long term is guarded.  Leonia Reeves.D.

## 2014-10-01 NOTE — Progress Notes (Signed)
ANTICOAGULATION CONSULT NOTE - Follow Up Consult  Pharmacy Consult for Heparin Indication: atrial fibrillation  No Known Allergies  Patient Measurements: Height: 5\' 11"  (180.3 cm) (family reports at the bedside. ) Weight: 169 lb 8.5 oz (76.9 kg) IBW/kg (Calculated) : 75.3   Vital Signs: Temp: 97.2 F (36.2 C) (06/08 1315) Temp Source: Oral (06/08 0730) BP: 123/61 mmHg (06/08 1315) Pulse Rate: 72 (06/08 1339)  Labs:  Recent Labs  09/29/14 2301 09/30/14 0110 09/30/14 0510 09/30/14 0930 09/30/14 1303 09/30/14 1830 10/01/14 0250  HGB  --   --  11.3*  --  12.8*  --  11.9*  HCT  --   --  35.2*  --  39.1  --  35.3*  PLT  --   --  184  --  177  --  165  LABPROT  --   --   --   --   --   --  16.7*  INR  --   --   --   --   --   --  1.34  HEPARINUNFRC  --   --   --  0.13*  --  0.32 0.13*  CREATININE 4.65*  --   --   --  3.12*  --  3.96*  TROPONINI 0.07* 0.07* 0.10*  --  0.11*  --   --     Estimated Creatinine Clearance: 23.8 mL/min (by C-G formula based on Cr of 3.96).  Assessment: 49yom started on IV heparin yesterday for new onset afib. Heparin level was low this morning. He was re-bolused and rate was increased, however, heparin was turned off ~0900 as patient went to OR for HD cath. Per Dr. Arbie CookeyEarly, ok to resume heparin 'anytime' post-op.  Goal of Therapy:  Heparin level 0.3-0.7 units/ml Monitor platelets by anticoagulation protocol: Yes   Plan:  1) Resume heparin at 1700 units/hr 2) Check 6 hour heparin level  Fredrik RiggerMarkle, [place] Sue 10/01/2014,1:50 PM

## 2014-10-01 NOTE — Progress Notes (Signed)
No distress Cognition appears intact RASS 0, + F/C No new complaints  Filed Vitals:   10/01/14 1339 10/01/14 1345 10/01/14 1400 10/01/14 1415  BP:  139/60 139/69 147/67  Pulse: 72 72 69 70  Temp:      TempSrc:      Resp: 20 23 16 21   Height:      Weight:      SpO2: 99% 99% 98% 98%   Chronically ill appearing, NAD RASS 0, + F/C, MAEs HEENT WNL JVP not visualized Chest CTA Tachy, freq extrsystoles NABS No LE edema Severe LE excoriations and ulcerations without erythema or purulence   I have reviewed all of today's lab results. Relevant abnormalities are discussed in the A/P section   CXR: CM , mildIS edema pattern  IMPRESSION: Acute hypoxic resp failure Mild pulm edema Severe cardiomyopathy - LVEF 20% by Echo 6/03 Episode of VT and AF 6/06 ESRD - for tunneled cath placement 6/08 Severe PVD with LE ischemia Suspected cellulitis  V/Z 6/01 >>   Mildly elevated PCT 6/08  Adult FTT  PLAN/REC: Transfer to SDU Cont amiodarone for now EP consult requested 6/08 Renal service following Cont current abx for now TRH to resume primary duties 6/09 and PCCM to sign off. Discissued with Dr Nichola SizerMcClung  Shayana Hornstein, MD ; [facility] [phone].  After 5:30 PM or weekends, call [phone]

## 2014-10-02 ENCOUNTER — Encounter (HOSPITAL_COMMUNITY): Payer: Self-pay | Admitting: Vascular Surgery

## 2014-10-02 DIAGNOSIS — I96 Gangrene, not elsewhere classified: Secondary | ICD-10-CM | POA: Diagnosis present

## 2014-10-02 LAB — GLUCOSE, CAPILLARY
GLUCOSE-CAPILLARY: 139 mg/dL — AB (ref 65–99)
GLUCOSE-CAPILLARY: 155 mg/dL — AB (ref 65–99)
GLUCOSE-CAPILLARY: 171 mg/dL — AB (ref 65–99)
GLUCOSE-CAPILLARY: 98 mg/dL (ref 65–99)
Glucose-Capillary: 143 mg/dL — ABNORMAL HIGH (ref 65–99)
Glucose-Capillary: 161 mg/dL — ABNORMAL HIGH (ref 65–99)

## 2014-10-02 LAB — PROCALCITONIN: Procalcitonin: 2.76 ng/mL

## 2014-10-02 LAB — RENAL FUNCTION PANEL
ALBUMIN: 2.2 g/dL — AB (ref 3.5–5.0)
ANION GAP: 18 — AB (ref 5–15)
BUN: 51 mg/dL — ABNORMAL HIGH (ref 6–20)
CALCIUM: 8.2 mg/dL — AB (ref 8.9–10.3)
CO2: 20 mmol/L — ABNORMAL LOW (ref 22–32)
Chloride: 99 mmol/L — ABNORMAL LOW (ref 101–111)
Creatinine, Ser: 4.63 mg/dL — ABNORMAL HIGH (ref 0.61–1.24)
GFR calc Af Amer: 16 mL/min — ABNORMAL LOW (ref >60)
GFR calc non Af Amer: 13 mL/min — ABNORMAL LOW (ref >60)
Glucose, Bld: 161 mg/dL — ABNORMAL HIGH (ref 65–99)
PHOSPHORUS: 5.7 mg/dL — AB (ref 2.5–4.6)
Potassium: 3.7 mmol/L (ref 3.5–5.1)
Sodium: 137 mmol/L (ref 135–145)

## 2014-10-02 LAB — CBC
HEMATOCRIT: 33.8 % — AB (ref 39.0–52.0)
Hemoglobin: 11.1 g/dL — ABNORMAL LOW (ref 13.0–17.0)
MCH: 28.5 pg (ref 26.0–34.0)
MCHC: 32.8 g/dL (ref 30.0–36.0)
MCV: 86.9 fL (ref 78.0–100.0)
PLATELETS: 171 10*3/uL (ref 150–400)
RBC: 3.89 MIL/uL — ABNORMAL LOW (ref 4.22–5.81)
RDW: 15.6 % — ABNORMAL HIGH (ref 11.5–15.5)
WBC: 10.8 10*3/uL — AB (ref 4.0–10.5)

## 2014-10-02 LAB — HEPARIN LEVEL (UNFRACTIONATED)
HEPARIN UNFRACTIONATED: 0.51 [IU]/mL (ref 0.30–0.70)
HEPARIN UNFRACTIONATED: 0.6 [IU]/mL (ref 0.30–0.70)
Heparin Unfractionated: 0.77 IU/mL — ABNORMAL HIGH (ref 0.30–0.70)

## 2014-10-02 MED ORDER — VANCOMYCIN HCL IN DEXTROSE 750-5 MG/150ML-% IV SOLN
750.0000 mg | INTRAVENOUS | Status: AC
  Administered 2014-10-02: 750 mg via INTRAVENOUS
  Filled 2014-10-02: qty 150

## 2014-10-02 MED ORDER — SENNA 8.6 MG PO TABS
1.0000 | ORAL_TABLET | Freq: Two times a day (BID) | ORAL | Status: DC | PRN
  Administered 2014-10-03: 8.6 mg via ORAL
  Filled 2014-10-02 (×2): qty 1

## 2014-10-02 MED ORDER — LORAZEPAM 2 MG/ML IJ SOLN
INTRAMUSCULAR | Status: AC
  Administered 2014-10-02: 0.5 mg
  Filled 2014-10-02: qty 1

## 2014-10-02 MED ORDER — NEPRO/CARBSTEADY PO LIQD
237.0000 mL | Freq: Two times a day (BID) | ORAL | Status: DC
  Administered 2014-10-03 – 2014-10-17 (×18): 237 mL via ORAL
  Filled 2014-10-02 (×14): qty 237

## 2014-10-02 MED ORDER — LORAZEPAM 2 MG/ML IJ SOLN: 0.5000 mg | Freq: Once | INTRAMUSCULAR | Status: AC

## 2014-10-02 MED ORDER — PRO-STAT SUGAR FREE PO LIQD
30.0000 mL | Freq: Two times a day (BID) | ORAL | Status: DC
  Administered 2014-10-02 – 2014-10-17 (×25): 30 mL via ORAL
  Filled 2014-10-02 (×37): qty 30

## 2014-10-02 NOTE — Progress Notes (Signed)
Speech Language Pathology Treatment: Dysphagia  Patient Details Name: [name] MRN: [id number] DOB: Jun 28, 1965 Today's Date: 10/02/2014 Time: 4469-5072 SLP Time Calculation (min) (ACUTE ONLY): 10 min  Assessment / Plan / Recommendation Clinical Impression  Pt sleepy but eager for PO intake. SLP assisted pt with portion of lunch meal consisting of Dys 1 and 2 textures and thin liquids via straw. Liquids are consumed without incidence, despite impulsivity with intake. Mild oral residuals remain s/p Dys 2 boluses, which are cleared easily when followed by purees. Mod cueing provided for sustained attention and awareness of residuals. Will continue to follow.   HPI Other Pertinent Information: 49 y.o. male with PMH of hypertension, hyperlipidemia, diabetes mellitus, GERD, CAD(s/P of CABG 2010), combined systolic and diastolic congestive heart failure, chronic kidney disease-stage IV, severe PAD, substance abuse, who presents with right leg swelling and pain. CXR Stable mild cardiomegaly. Low lung volumes without acute findings. Found to have metabolic encephalopathy multifactorial to include uremia, worsening cardiac function, iatrogenic (sedating medication), bilateral lower leg cellulitis and sepsis.   Pertinent Vitals Pain Assessment: No/denies pain  SLP Plan  Continue with current plan of care    Recommendations Diet recommendations: Dysphagia 2 (fine chop);Thin liquid Liquids provided via: Cup;Straw Medication Administration: Whole meds with puree Supervision: Patient able to self feed;Full supervision/cueing for compensatory strategies Compensations: Slow rate;Small sips/bites;Check for pocketing Postural Changes and/or Swallow Maneuvers: Seated upright 90 degrees       Oral Care Recommendations: Oral care BID Plan: Continue with current plan of care    Maxcine Ham, M.A. CCC-SLP [phone]  Maxcine Ham 10/02/2014, 2:24 PM

## 2014-10-02 NOTE — Progress Notes (Signed)
ANTICOAGULATION CONSULT NOTE - Follow Up Consult  Pharmacy Consult for Heparin Indication: atrial fibrillation  No Known Allergies  Patient Measurements: Height: 5\' 11"  (180.3 cm) Weight: 167 lb 15.9 oz (76.2 kg) IBW/kg (Calculated) : 75.3 Heparin Dosing Weight: 76.2 kg  Vital Signs: Temp: 98.4 F (36.9 C) (06/09 1200) Temp Source: Oral (06/09 1200) BP: 128/74 mmHg (06/09 1051) Pulse Rate: 86 (06/09 1051)  Labs:  Recent Labs  09/30/14 0110  09/30/14 0510  09/30/14 1303  10/01/14 0250 10/02/14 0238 10/02/14 1200  HGB  --   < > 11.3*  --  12.8*  --  11.9* 11.1*  --   HCT  --   < > 35.2*  --  39.1  --  35.3* 33.8*  --   PLT  --   < > 184  --  177  --  165 171  --   LABPROT  --   --   --   --   --   --  16.7*  --   --   INR  --   --   --   --   --   --  1.34  --   --   HEPARINUNFRC  --   --   --   < >  --   < > 0.13* 0.51 0.77*  CREATININE  --   --   --   --  3.12*  --  3.96* 4.63*  --   TROPONINI 0.07*  --  0.10*  --  0.11*  --   --   --   --   < > = values in this interval not displayed.  Estimated Creatinine Clearance: 20.3 mL/min (by C-G formula based on Cr of 4.63).  Assessment: CC: progressive weakness and bilateral leg swelling with weeping edema  Anticoagulation: Heparin for new Afib. Heparin level 0.51, 0.77 slightly elevated. CBC stable. I do not note that any additional heparin was given during HD today.  Goal of Therapy:  Heparin level 0.3-0.7 units/ml Monitor platelets by anticoagulation protocol: Yes   Plan:  - Heparin decrease to 1550 units/hr. Recheck in 6 hrs. - Vanco 750mg  IV x 1 today. - Cont Zosyn 2.25g q 8h   Aryaa Bunting S. Merilynn Finland, PharmD, BCPS Clinical Staff Pharmacist Pager [phone]  Misty Stanley Stillinger 10/02/2014,1:46 PM

## 2014-10-02 NOTE — Progress Notes (Signed)
OT Cancellation Note  Patient Details Name: [name] MRN: [id number] DOB: 1965/12/25   Cancelled Treatment:    Reason Eval/Treat Not Completed: Patient at procedure or test/ unavailable. Pt at HD. Acute OT will follow up as available to address POC.   [name] M 10/02/2014, 8:17 AM

## 2014-10-02 NOTE — Progress Notes (Signed)
ANTICOAGULATION CONSULT NOTE - Follow Up Consult  Pharmacy Consult for Heparin Indication: atrial fibrillation  No Known Allergies  Patient Measurements: Height: 5\' 11"  (180.3 cm) Weight: 167 lb 15.9 oz (76.2 kg) IBW/kg (Calculated) : 75.3 Heparin Dosing Weight: 76.2 kg  Vital Signs: Temp: 98.2 F (36.8 C) (06/09 1925) Temp Source: Oral (06/09 1925) BP: 150/75 mmHg (06/09 1712) Pulse Rate: 81 (06/09 1712)  Labs:  Recent Labs  09/30/14 0110  09/30/14 0510  09/30/14 1303  10/01/14 0250 10/02/14 0238 10/02/14 1200 10/02/14 2024  HGB  --   < > 11.3*  --  12.8*  --  11.9* 11.1*  --   --   HCT  --   < > 35.2*  --  39.1  --  35.3* 33.8*  --   --   PLT  --   < > 184  --  177  --  165 171  --   --   LABPROT  --   --   --   --   --   --  16.7*  --   --   --   INR  --   --   --   --   --   --  1.34  --   --   --   HEPARINUNFRC  --   --   --   < >  --   < > 0.13* 0.51 0.77* 0.60  CREATININE  --   --   --   --  3.12*  --  3.96* 4.63*  --   --   TROPONINI 0.07*  --  0.10*  --  0.11*  --   --   --   --   --   < > = values in this interval not displayed.  Estimated Creatinine Clearance: 20.3 mL/min (by C-G formula based on Cr of 4.63).  Assessment: CC: progressive weakness and bilateral leg swelling with weeping edema  Anticoagulation: Heparin for new Afib. Heparin level 0.6 at goal this evening. CBC stable. No bleeding issues noted. Will continue current rate for now.  Currently in nsr  This patients CHA2DS2-VASc Score and unadjusted Ischemic Stroke Rate (% per year) is equal to 4.8 % stroke rate/year from a score of 4  Above score calculated as 1 point each if present [CHF, HTN, DM, Vascular=MI/PAD/Aortic Plaque, Age if 65-74, or Male] Above score calculated as 2 points each if present [Age > 75, or Stroke/TIA/TE]   Goal of Therapy:  Heparin level 0.3-0.7 units/ml Monitor platelets by anticoagulation protocol: Yes   Plan:  Continue Heparin at 1550 units/hr.   Recheck in in am  Sheppard Coil PharmD., BCPS Clinical Pharmacist Pager [phone] 10/02/2014 9:30 PM

## 2014-10-02 NOTE — Progress Notes (Signed)
Nutrition Follow-up  DOCUMENTATION CODES:  Severe malnutrition in context of chronic illness  INTERVENTION:  Nepro shake BID, Prostat BID  NUTRITION DIAGNOSIS:  Malnutrition related to chronic illness as evidenced by severe depletion of body fat, severe depletion of muscle mass.  ongoing  GOAL:  Patient will meet greater than or equal to 90% of their needs  Not met  MONITOR:  PO intake, Supplement acceptance, Skin, I & O's, Weight trends, Labs  REASON FOR ASSESSMENT:  NPO/Clear Liquid Diet    ASSESSMENT:  Hx of uncontrolled DM, CHF, severe PAD, CAD s/p CABG, CKD IV, and ETOH abuse admitted with right leg pain/sweeling. Pt sepitc and started on IV abx, course complicated with persistent encephalopathy and worsening renal fx.  Pt now ESRD and started on HD 6/5.   Pt NPO x 8 days and started a PO diet 6/8 pm.  Labs reviewed: BUN/Cr, Phosphorus elevated; cbgs: 98-161 Medications reviewed and include: folic acid, novolog  Weight has significantly decreased. Pt is 1.5 L negative but expect wt loss also related to prolonged NPO status.   Height:  Ht Readings from Last 1 Encounters:  10/01/14 5' 11"  (1.803 m)    Weight:  Wt Readings from Last 1 Encounters:  10/02/14 167 lb 15.9 oz (76.2 kg)    Ideal Body Weight:  78 kg  Wt Readings from Last 10 Encounters:  10/02/14 167 lb 15.9 oz (76.2 kg)  08/07/14 194 lb 11.2 oz (88.315 kg)  02/10/14 200 lb 9.6 oz (90.992 kg)  11/07/13 185 lb 9.6 oz (84.188 kg)  10/01/13 189 lb (85.73 kg)  05/09/13 196 lb 3.2 oz (88.996 kg)  11/19/12 201 lb 9.6 oz (91.445 kg)  03/23/11 202 lb 2.6 oz (91.7 kg)    BMI:  Body mass index is 23.44 kg/(m^2).  Estimated Nutritional Needs:  Kcal:  2100 - 2300  Protein:  100 - 115 g  Fluid:  per MD  Skin:  Wound (see comment) (cellullitis, stage II sacrum)  Diet Order:  DIET DYS 2 Room service appropriate?: Yes; Fluid consistency:: Thin  EDUCATION NEEDS:  No education needs  identified at this time   Intake/Output Summary (Last 24 hours) at 10/02/14 1629 Last data filed at 10/02/14 1438  Gross per 24 hour  Intake 1737.93 ml  Output   3215 ml  Net -1477.07 ml    Last BM:  6/9 smear  [place], [place], [place] Pager [phone] After Hours Pager

## 2014-10-02 NOTE — Consult Note (Signed)
[facility]  Subjective  - more alert   Objective 130/71 72 98.2 F (36.8 C) (Oral) 16 94%  Intake/Output Summary (Last 24 hours) at 10/02/14 0843 Last data filed at 10/02/14 0600  Gross per 24 hour  Intake 1448.6 ml  Output    155 ml  Net 1293.6 ml   Extremities: Right leg 1 + femoral pulse no pop or pedal pulse, left foot pink warm Skin scattered ulcers primarily right foot leg HD catheter working on dialysis  \ABI reviewed: no flow right foot, left 0.89  Assessment/Planning:  1. Access: will defer permanent HD access until pt has made significant recovery 2.  Right leg no blood flow.  Pt in no shape for revascularization.  Currently no pain or sepsis.  Will most likely come to amputation but pt wishes to observe for now.  Will continue local wound care unless pain sepsis or progressive wounds then most likely wound need AKA  Fabienne Bruns, MD [facility] [place] Office: [phone] Pager: [phone]   Fabienne Bruns 10/02/2014 8:43 AM --  Laboratory Lab Results:  Recent Labs  10/01/14 0250 10/02/14 0238  WBC 13.5* 10.8*  HGB 11.9* 11.1*  HCT 35.3* 33.8*  PLT 165 171   BMET  Recent Labs  10/01/14 0250 10/02/14 0238  NA 139 137  K 4.6 3.7  CL 103 99*  CO2 15* 20*  GLUCOSE 127* 161*  BUN 47* 51*  CREATININE 3.96* 4.63*  CALCIUM 8.3* 8.2*    COAG Lab Results  Component Value Date   INR 1.34 10/01/2014   INR 1.45 09/27/2014   INR 1.39 09/24/2014   No results found for: PTT

## 2014-10-02 NOTE — Progress Notes (Signed)
ANTICOAGULATION CONSULT NOTE - Follow Up Consult  Pharmacy Consult for heparin Indication: atrial fibrillation   Labs:  Recent Labs  09/29/14 2301 09/30/14 0110  09/30/14 0510  09/30/14 1303 09/30/14 1830 10/01/14 0250 10/02/14 0238  HGB  --   --   < > 11.3*  --  12.8*  --  11.9* 11.1*  HCT  --   --   < > 35.2*  --  39.1  --  35.3* 33.8*  PLT  --   --   < > 184  --  177  --  165 171  LABPROT  --   --   --   --   --   --   --  16.7*  --   INR  --   --   --   --   --   --   --  1.34  --   HEPARINUNFRC  --   --   --   --   < >  --  0.32 0.13* 0.51  CREATININE 4.65*  --   --   --   --  3.12*  --  3.96*  --   TROPONINI 0.07* 0.07*  --  0.10*  --  0.11*  --   --   --   < > = values in this interval not displayed.    Assessment/Plan:  49yo male therapeutic on heparin after resumed. Will continue gtt at current rate and confirm stable with additional level.   Vernard Gambles, PharmD, BCPS  10/02/2014,3:11 AM

## 2014-10-02 NOTE — Progress Notes (Signed)
PT Cancellation Note  Patient Details Name: [name] MRN: [id number] DOB: 1966-04-17   Cancelled Treatment:    Reason Eval/Treat Not Completed: Medical issues which prohibited therapy.  Pt in HD.  Will see when able. 10/02/2014   Bing, PT [phone]  (pager)   Judeth Gilles, Eliseo Gum 10/02/2014, 2:24 PM

## 2014-10-02 NOTE — Progress Notes (Signed)
At approx. 0200 entered pt's room after hearing him calling out into the hall to find him now oriented to self only and adamant about leaving the hospital. At his request and in hopes of reorienting him I dialed his sister's Investment banker, corporate) phone number at his recitation and gave him the phone. She answered and he endorsed he wasn't in the hospital and needed her to come get him. He then hung up and became increasingly agitated. E-Link MD was paged and 0.5mg  Ativan ordered and administered. Pt now resting peacefully. Sherri was called and situation was explained. She was amendable to our treatment of her brother's confusion and endorsed she would be by to see him today. Will continue to monitor and assess.

## 2014-10-02 NOTE — Procedures (Signed)
Pt seen on HD. Ap 220 Vp 150.  K 3.7, will change to 4K bath.  I still do not think he is competent to make decisions.  He will need nutrition at some point.

## 2014-10-02 NOTE — Progress Notes (Signed)
eLink Physician-Brief Progress Note Patient Name: [name] DOB: [birth year] MRN: [id number]   Date of Service  10/02/2014  HPI/Events of Note  Anxiety  eICU Interventions  Ativan     Intervention Category Major Interventions: Other:  YACOUB,WESAM 10/02/2014, 2:14 AM

## 2014-10-02 NOTE — Progress Notes (Signed)
[place] TEAM 1 - Stepdown/ICU TEAM Progress Note  Nishawn Rotan UVO:[id number] DOB: 1965-07-25 DOA: 09/24/2014 PCP: [name], NP  Admit HPI / Brief Narrative: 49 y.o. WM PMHx hypertension, hyperlipidemia, diabetes mellitus, GERD, CAD(s/P of CABG 2010), combined systolic and diastolic congestive heart failure (EF 20-25% with grade 3 diastolic dysfunction), chronic kidney disease-stage IV, severe PAD, substance abuse, who presents with right leg swelling and pain.  Patient is a poor historian. It seems that he has been having right leg pain and swelling in the past several days. His leg becomes red from foot to upper thigh medially. He does not have fever or chills. Patient was unable to ambulate over the past 2 days secondary to pain on the right leg. He reports a generalized weakness. States he has decreased urinary output and constipation, states he has not been eating and drinking regularly. Patient states that he is living with his mother.  In ED, patient was found to have WBC 13.8, tachycardia, normal temperature, stable blood pressure, AoCKD-IV, negative chest x-ray for acute abnormalities, ALP 168, AST 27, ALT 15, total bilirubin 4.4, lactate 1.85, INR 1.39, urinalysis with small amount of leukocytes. Patient is admitted to inpatient for further evaluation and treatment. Renal was consulted by ED  HPI/Subjective: 6/9 A/O 2 (does not know when, why), negative SOB, negative CP, negative lower extremity pain  Assessment/Plan: Metabolic Encephalopathy -Multifactorial to include uremia, worsening cardiac function, iatrogenic (sedating medication) -S/P HD today cognition has improved, however still not A/O 4  Bilateral lower leg Cellulitis  and sepsis due to unspecified organism:  -continue empiric anabiotic  - Blood cultures x 2 NGTD  Right Lower leg gangrene? -Appears to be developing gangrenous ulcers dorsal surface of right foot. PT pulse only to Doppler, DP pulse absent.    -Vascular surgery already consult and has informed patient may progress to requiring AKA   Jaundice (multiple tattoos which appear to be homemade?) -Acute Hepatitis panel negative - RPR negative -anti-nuclear antibody negative -HIV negative  PAD: - Patient had arterial Doppler 2013, which showed Right and left anterior tibial arteries and Left SFA-occluded; Right CIA-<50% diameter reduction; Right Deep Profunda-70-99% diameter reduction; Right SFA->60% diameter reduction; Right Distal Popliteal/Tibial Artery- >60% diameter reduction; Left CFA and Profunda- >50% diameter reduction. -See right lower leg gangrene.  CAD in native artery and s/p of CABG 2010:  - EKG showed old left bundle blockage. -Metoprol IV  QID  Chronic Systolic CHF/Cardiomyopathy  -2-D echo on 02/21/14 showed EF 20-25% with grade 3 diastolic dysfunction.  - seen by Dr. Royann Shivers on 08/07/14. Per Dr. Royann Shivers, the decisions regarding cardiac device therapy is depending heavily on his decision regarding dialysis. Subcutaneous ICD was considered if he starts dialysis. -6/3 Echocardiogram; shows cardiomyopathy with worsening EF see results below  -Continue metoprolol IV 5 mg QID -Continue hydralazine 10 mg every4hr  HTN: -See CAD  Hyperlipidemia:  -lipid panel abnormal  DM type-II controlled:  -Last A1c 10.5 on 03/19/11.,  -6/3 Hemoglobin A1c= 6.4 -Sensitive SSI  Acute on chronic renal failure( Previous creatinine was a 5.85 on 03/11/14) -Be very judicious in the use of narcotics and benzodiazepine as patient will not clear them well . -Current Cr= 7. 52 -IVF as above -Avoid ACEI and NSAIDs -Patient S/P HD today.  Hypokalemia -Potassium goal>4    Code Status: FULL Family Communication: no family present at time of exam Disposition Plan: Resolution sepsis    Consultants: Dr.James Kerry Kass (nephrology) Dr. Zetta Bills (nephrology)   Procedure/Significant Events:  6/3 echocardiogram;- Left ventricle:  The cavity size was mildly dilated. mild LVH.  -LVEF= 20%. Diffuse hypokinesis. - Left atrium:  moderately dilated.- Right ventricle: mildly to moderately dilated.  - Right atrium: The atrium was mildly dilated.   Culture 6/1 blood right arm left forearm NGTD 6/1 urine insignificant growth 6/2 MRSA by PCR negative 6/1 HIV negative 6/3 RPR negative 6/3 hepatitis panel negative   Antibiotics: Vancomycin 6 /1>> Zosyn 6/2>>  DVT prophylaxis: Subcutaneous heparin   Devices    LINES / TUBES:      Continuous Infusions: . sodium chloride 10 mL/hr at 10/01/14 2000  . sodium chloride 10 mL/hr at 10/01/14 1018  . amiodarone 30 mg/hr (10/02/14 2100)  . heparin 1,550 Units/hr (10/02/14 2100)    Objective: VITAL SIGNS: Temp: 98.2 F (36.8 C) (06/09 1925) Temp Source: Oral (06/09 1925) BP: 124/61 mmHg (06/09 1928) Pulse Rate: 81 (06/09 1712) SPO2; FIO2:   Intake/Output Summary (Last 24 hours) at 10/02/14 2247 Last data filed at 10/02/14 2103  Gross per 24 hour  Intake 1339.4 ml  Output   3200 ml  Net -1860.6 ml     Exam: General: A/O 2 (does not know when, why), cachectic, No acute respiratory distress Eyes: retinal hemorrhage, sluggish reaction pupils to light. ENT: Negative Runny nose, Neck:  Negative scars, masses, torticollis, lymphadenopathy, JVD Lungs: Clear to auscultation bilaterally without wheezes or crackles Cardiovascular: Regular Rhythm with frequent PVC, and rate without murmur gallop or rub normal S1 and S2 Abdomen:negative abdominal pain, negative dysphagia, nondistended, soft, bowel sounds positive, no rebound, no ascites, no appreciable mass Extremities: Bilateral lower extremity erythema has mostly resolved unfortunately, RLE negative palpable/dopplerable DP pulse, cold to the touch, taking on the appearance of ulcerative gangrenous ulcer; dopplerable PT pulse faint  Psychiatric:  Unable to assess  Neurologic:  Moves all extremities to command      Data Reviewed: Basic Metabolic Panel:  Recent Labs Lab 09/26/14 0345 09/27/14 0236 09/28/14 0540 09/29/14 0013 09/29/14 1036 09/29/14 2301 09/30/14 1303 10/01/14 0250 10/02/14 0238  NA 138 140 143 144  --  140 140 139 137  K 3.3* 3.4* 4.2 3.9  --  3.8 3.6 4.6 3.7  CL 97* 100* 102 103  --  101 99* 103 99*  CO2 19* 21* 19* 19*  --  17* 22 15* 20*  GLUCOSE 75 98 78 109*  --  162* 112* 127* 161*  BUN 130* 130* 140* 144*  --  67* 34* 47* 51*  CREATININE 7.35* 7.35* 7.52* 7.82*  --  4.65* 3.12* 3.96* 4.63*  CALCIUM 8.3* 8.2* 8.3* 8.1*  --  8.2* 8.3* 8.3* 8.2*  MG 2.4 2.3 2.4 2.5*  --  2.2  --   --   --   PHOS  --   --   --   --  7.4*  --  3.8  --  5.7*   Liver Function Tests:  Recent Labs Lab 09/26/14 0345 09/27/14 0236 09/28/14 0540 09/29/14 0013 09/29/14 2301 09/30/14 1303 10/02/14 0238  AST 18 17 25 19 29   --   --   ALT 13* 11* 12* 9* 12*  --   --   ALKPHOS 113 99 96 80 90  --   --   BILITOT 3.5* 3.2* 3.4* 3.4* 4.2*  --   --   PROT 6.2* 5.8* 6.0* 5.6* 6.2*  --   --   ALBUMIN 2.4* 2.2* 2.2* 2.0* 2.3* 2.3* 2.2*    Recent Labs Lab 09/26/14  0100  LIPASE 106*    Recent Labs Lab 09/30/14 0110  AMMONIA 28   CBC:  Recent Labs Lab 09/26/14 0345 09/27/14 0236 09/28/14 0540 09/29/14 0013 09/30/14 0510 09/30/14 1303 10/01/14 0250 10/02/14 0238  WBC 12.4* 11.7* 12.5* 12.2* 13.1* 14.7* 13.5* 10.8*  NEUTROABS 11.6* 10.8* 11.2* 11.2*  --   --   --   --   HGB 11.5* 10.4* 11.3* 10.8* 11.3* 12.8* 11.9* 11.1*  HCT 34.2* 31.4* 34.9* 33.5* 35.2* 39.1 35.3* 33.8*  MCV 87.2 87.0 87.7 88.6 86.7 87.1 86.1 86.9  PLT 145* 156 182 172 184 177 165 171   Cardiac Enzymes:  Recent Labs Lab 09/29/14 2301 09/30/14 0110 09/30/14 0510 09/30/14 1303  TROPONINI 0.07* 0.07* 0.10* 0.11*   BNP (last 3 results)  Recent Labs  09/25/14 0410  BNP 2227.0*    ProBNP (last 3 results) No results for input(s): PROBNP in the last 8760 hours.  CBG:  Recent Labs Lab  10/02/14 0040 10/02/14 0347 10/02/14 1215 10/02/14 1631 10/02/14 1928  GLUCAP 161* 139* 98 143* 155*    Recent Results (from the past 240 hour(s))  Blood Culture (routine x 2)     Status: None   Collection Time: 09/24/14  7:17 PM  Result Value Ref Range Status   Specimen Description BLOOD RIGHT ARM  Final   Special Requests BOTTLES DRAWN AEROBIC AND ANAEROBIC 5CC  Final   Culture   Final    NO GROWTH 5 DAYS Performed at [facility]    Report Status 10/01/2014 FINAL  Final  Blood Culture (routine x 2)     Status: None   Collection Time: 09/24/14  7:17 PM  Result Value Ref Range Status   Specimen Description BLOOD LEFT FOREARM  Final   Special Requests BOTTLES DRAWN AEROBIC AND ANAEROBIC 5CC  Final   Culture   Final    NO GROWTH 5 DAYS Performed at [facility]    Report Status 10/01/2014 FINAL  Final  Urine culture     Status: None   Collection Time: 09/24/14  7:23 PM  Result Value Ref Range Status   Specimen Description URINE, RANDOM  Final   Special Requests NONE  Final   Colony Count   Final    8,000 COLONIES/ML Performed at [facility]    Culture   Final    INSIGNIFICANT GROWTH Performed at [facility]    Report Status 09/25/2014 FINAL  Final  MRSA PCR Screening     Status: None   Collection Time: 09/25/14  7:00 AM  Result Value Ref Range Status   MRSA by PCR NEGATIVE NEGATIVE Final    Comment:        The GeneXpert MRSA Assay (FDA approved for NASAL specimens only), is one component of a comprehensive MRSA colonization surveillance program. It is not intended to diagnose MRSA infection nor to guide or monitor treatment for MRSA infections.      Studies:  Recent x-ray studies have been reviewed in detail by the Attending Physician  Scheduled Meds:  Scheduled Meds: . [START ON 10/03/2014] feeding supplement (NEPRO CARB STEADY)  237 mL Oral BID BM  . feeding supplement (PRO-STAT SUGAR FREE 64)  30 mL Oral BID  .  ferumoxytol  510 mg Intravenous Q Tue-HD  . folic acid  1 mg Intravenous Daily  . hydrALAZINE  10 mg Intravenous 6 times per day  . insulin aspart  0-9 Units Subcutaneous 6 times per day  .  metoprolol  5 mg Intravenous 4 times per day  . pantoprazole (PROTONIX) IV  40 mg Intravenous Q24H  . piperacillin-tazobactam (ZOSYN)  IV  2.25 g Intravenous 3 times per day  . sodium chloride  3 mL Intravenous Q12H  . thiamine  100 mg Intravenous Daily    Time spent on care of this patient: 40 mins   Librada Castronovo, Roselind Messier , MD  [facility] Office  [phone] Pager - [phone]  On-Call/Text Page:      [email or web address]      password [email or web address]  If 7PM-7AM, please contact night-coverage [email or web address] Password [email or web address] 10/02/2014, 10:47 PM   LOS: 8 days   Care during the described time interval was provided by me .  I have reviewed this patient's available data, including medical history, events of note, physical examination, and all test results as part of my evaluation. I have personally reviewed and interpreted all radiology studies.   Carolyne Littles, MD [phone] Pager

## 2014-10-03 ENCOUNTER — Encounter (HOSPITAL_COMMUNITY)
Admission: EM | Disposition: A | Discharge status: Skilled Nursing Facility | Payer: Self-pay | Source: Home / Self Care | Attending: Internal Medicine

## 2014-10-03 DIAGNOSIS — L8990 Pressure ulcer of unspecified site, unspecified stage: Secondary | ICD-10-CM | POA: Insufficient documentation

## 2014-10-03 LAB — COMPREHENSIVE METABOLIC PANEL
ALBUMIN: 2.2 g/dL — AB (ref 3.5–5.0)
ALT: 12 U/L — ABNORMAL LOW (ref 17–63)
ANION GAP: 17 — AB (ref 5–15)
AST: 22 U/L (ref 15–41)
Alkaline Phosphatase: 90 U/L (ref 38–126)
BUN: 31 mg/dL — ABNORMAL HIGH (ref 6–20)
CO2: 23 mmol/L (ref 22–32)
CREATININE: 3.68 mg/dL — AB (ref 0.61–1.24)
Calcium: 8.2 mg/dL — ABNORMAL LOW (ref 8.9–10.3)
Chloride: 96 mmol/L — ABNORMAL LOW (ref 101–111)
GFR calc Af Amer: 21 mL/min — ABNORMAL LOW (ref >60)
GFR calc non Af Amer: 18 mL/min — ABNORMAL LOW (ref >60)
Glucose, Bld: 171 mg/dL — ABNORMAL HIGH (ref 65–99)
Potassium: 3.7 mmol/L (ref 3.5–5.1)
Sodium: 136 mmol/L (ref 135–145)
Total Bilirubin: 2.8 mg/dL — ABNORMAL HIGH (ref 0.3–1.2)
Total Protein: 5.9 g/dL — ABNORMAL LOW (ref 6.5–8.1)

## 2014-10-03 LAB — GLUCOSE, CAPILLARY
GLUCOSE-CAPILLARY: 107 mg/dL — AB (ref 65–99)
GLUCOSE-CAPILLARY: 185 mg/dL — AB (ref 65–99)
Glucose-Capillary: 151 mg/dL — ABNORMAL HIGH (ref 65–99)
Glucose-Capillary: 150 mg/dL — ABNORMAL HIGH (ref 65–99)
Glucose-Capillary: 206 mg/dL — ABNORMAL HIGH (ref 65–99)

## 2014-10-03 LAB — MAGNESIUM: Magnesium: 2 mg/dL (ref 1.7–2.4)

## 2014-10-03 LAB — CBC
HEMATOCRIT: 32.4 % — AB (ref 39.0–52.0)
Hemoglobin: 10.5 g/dL — ABNORMAL LOW (ref 13.0–17.0)
MCH: 28.2 pg (ref 26.0–34.0)
MCHC: 32.4 g/dL (ref 30.0–36.0)
MCV: 86.9 fL (ref 78.0–100.0)
PLATELETS: 154 10*3/uL (ref 150–400)
RBC: 3.73 MIL/uL — AB (ref 4.22–5.81)
RDW: 15.6 % — AB (ref 11.5–15.5)
WBC: 9.8 10*3/uL (ref 4.0–10.5)

## 2014-10-03 LAB — HEPARIN LEVEL (UNFRACTIONATED): Heparin Unfractionated: 0.36 IU/mL (ref 0.30–0.70)

## 2014-10-03 LAB — LACTIC ACID, PLASMA: Lactic Acid, Venous: 1.3 mmol/L (ref 0.5–2.0)

## 2014-10-03 SURGERY — LEFT HEART CATH AND CORONARY ANGIOGRAPHY

## 2014-10-03 MED ORDER — VANCOMYCIN HCL IN DEXTROSE 750-5 MG/150ML-% IV SOLN
750.0000 mg | INTRAVENOUS | Status: AC
  Administered 2014-10-04: 750 mg via INTRAVENOUS
  Filled 2014-10-03 (×3): qty 150

## 2014-10-03 MED ORDER — VITAMIN B-1 100 MG PO TABS
100.0000 mg | ORAL_TABLET | Freq: Every day | ORAL | Status: DC
  Administered 2014-10-04 – 2014-10-18 (×14): 100 mg via ORAL
  Filled 2014-10-03 (×16): qty 1

## 2014-10-03 MED ORDER — ACETAMINOPHEN 500 MG PO TABS
500.0000 mg | ORAL_TABLET | Freq: Four times a day (QID) | ORAL | Status: DC | PRN
  Administered 2014-10-08 – 2014-10-14 (×3): 500 mg via ORAL
  Filled 2014-10-03 (×3): qty 1

## 2014-10-03 MED ORDER — MORPHINE SULFATE 2 MG/ML IJ SOLN
2.0000 mg | INTRAMUSCULAR | Status: DC | PRN
  Administered 2014-10-04 – 2014-10-07 (×16): 2 mg via INTRAVENOUS
  Filled 2014-10-03 (×16): qty 1

## 2014-10-03 MED ORDER — POLYETHYLENE GLYCOL 3350 17 G PO PACK
17.0000 g | PACK | Freq: Two times a day (BID) | ORAL | Status: DC
  Administered 2014-10-03 – 2014-10-17 (×16): 17 g via ORAL
  Filled 2014-10-03 (×29): qty 1

## 2014-10-03 MED ORDER — METOPROLOL TARTRATE 25 MG PO TABS
25.0000 mg | ORAL_TABLET | Freq: Two times a day (BID) | ORAL | Status: DC
  Administered 2014-10-03 – 2014-10-18 (×29): 25 mg via ORAL
  Filled 2014-10-03 (×34): qty 1

## 2014-10-03 MED ORDER — RENA-VITE PO TABS
1.0000 | ORAL_TABLET | Freq: Every day | ORAL | Status: DC
  Administered 2014-10-03 – 2014-10-17 (×15): 1 via ORAL
  Filled 2014-10-03 (×21): qty 1

## 2014-10-03 MED ORDER — SENNOSIDES-DOCUSATE SODIUM 8.6-50 MG PO TABS
1.0000 | ORAL_TABLET | Freq: Two times a day (BID) | ORAL | Status: DC
  Administered 2014-10-03 – 2014-10-16 (×20): 1 via ORAL
  Filled 2014-10-03 (×34): qty 1

## 2014-10-03 MED ORDER — PANTOPRAZOLE SODIUM 40 MG PO TBEC
40.0000 mg | DELAYED_RELEASE_TABLET | Freq: Every day | ORAL | Status: DC
  Administered 2014-10-04 – 2014-10-18 (×13): 40 mg via ORAL
  Filled 2014-10-03 (×10): qty 1

## 2014-10-03 MED ORDER — FOLIC ACID 1 MG PO TABS
1.0000 mg | ORAL_TABLET | Freq: Every day | ORAL | Status: DC
  Administered 2014-10-04 – 2014-10-18 (×14): 1 mg via ORAL
  Filled 2014-10-03 (×16): qty 1

## 2014-10-03 NOTE — Progress Notes (Signed)
S: Eating.   O:BP 132/65 mmHg  Pulse 81  Temp(Src) 98.3 F (36.8 C) (Oral)  Resp 19  Ht 5\' 11"  (1.803 m)  Wt 79.6 kg (175 lb 7.8 oz)  BMI 24.49 kg/m2  SpO2 100%  Intake/Output Summary (Last 24 hours) at 10/03/14 0748 Last data filed at 10/03/14 0600  Gross per 24 hour  Intake 1311.7 ml  Output   3270 ml  Net -1958.3 ml   Weight change: 0.6 kg (1 lb 5.2 oz) SNK:NLZJQ and alert CVS:RRR Resp: clear Abd: + BS NTND Ext: 1+ edema in thighs.  Multiple ulceration Rt leg, dry NEURO: Ox3, CNI No asterixis Rt IJ permcath   . feeding supplement (NEPRO CARB STEADY)  237 mL Oral BID BM  . feeding supplement (PRO-STAT SUGAR FREE 64)  30 mL Oral BID  . ferumoxytol  510 mg Intravenous Q Tue-HD  . folic acid  1 mg Intravenous Daily  . hydrALAZINE  10 mg Intravenous 6 times per day  . insulin aspart  0-9 Units Subcutaneous 6 times per day  . metoprolol  5 mg Intravenous 4 times per day  . pantoprazole (PROTONIX) IV  40 mg Intravenous Q24H  . piperacillin-tazobactam (ZOSYN)  IV  2.25 g Intravenous 3 times per day  . sodium chloride  3 mL Intravenous Q12H  . thiamine  100 mg Intravenous Daily   Dg Chest Port 1 View  10/01/2014   CLINICAL DATA:  Status post catheter insertion  EXAM: PORTABLE CHEST - 1 VIEW  COMPARISON:  09/27/2014  FINDINGS: There is a dual-lumen right-sided central venous catheter with the tip in satisfactory position. There is mild bilateral interstitial thickening. There is no pleural effusion or pneumothorax. There is stable cardiomegaly. There is evidence of prior CABG.  The osseous structures are unremarkable.  IMPRESSION: Dual-lumen right-sided central venous catheter with the tips in satisfactory position. No pneumothorax.  Mild pulmonary edema.   Electronically Signed   By: Elige Ko   On: 10/01/2014 12:04   BMET    Component Value Date/Time   NA 136 10/03/2014 0235   K 3.7 10/03/2014 0235   CL 96* 10/03/2014 0235   CO2 23 10/03/2014 0235   GLUCOSE 171*  10/03/2014 0235   BUN 31* 10/03/2014 0235   CREATININE 3.68* 10/03/2014 0235   CALCIUM 8.2* 10/03/2014 0235   GFRNONAA 18* 10/03/2014 0235   GFRAA 21* 10/03/2014 0235   CBC    Component Value Date/Time   WBC 9.8 10/03/2014 0235   RBC 3.73* 10/03/2014 0235   HGB 10.5* 10/03/2014 0235   HCT 32.4* 10/03/2014 0235   PLT 154 10/03/2014 0235   MCV 86.9 10/03/2014 0235   MCH 28.2 10/03/2014 0235   MCHC 32.4 10/03/2014 0235   RDW 15.6* 10/03/2014 0235   LYMPHSABS 0.8 09/29/2014 0013   MONOABS 0.2 09/29/2014 0013   EOSABS 0.0 09/29/2014 0013   BASOSABS 0.0 09/29/2014 0013     Assessment: 1.  New ESRD 2. Anemia.  Receiving feraheme 3. DM 4. Cardiomyopathy EF 20% 5. Ischemic Rt leg.  He was able to verbalize to me that if his leg was to worsen he would need an amputation.  He says if he had to have one then he would agree to it.   Plan: 1. Plan HD tomorrow 2. Start renavite 3. Get up to chair 1.[name] T

## 2014-10-03 NOTE — Clinical Social Work Note (Signed)
CSW spoke with the pt's sister Sherri. CSW presented bed offers. CSW explained that the pt has 5 offers [facility], [facility], [facility] [facility], [place], and [facility]. Sherri reported that she need to think and research which SNF would be best for her mother.   Dominic Mahaney, MSW, LCSWA [phone]

## 2014-10-03 NOTE — Consult Note (Signed)
Pt awake and alert  Filed Vitals:   10/03/14 0422 10/03/14 0700 10/03/14 0748 10/03/14 1154  BP:   120/71   Pulse:   71   Temp:  98.4 F (36.9 C)  98.2 F (36.8 C)  TempSrc:  Oral  Oral  Resp:   21   Height:      Weight: 175 lb 7.8 oz (79.6 kg)     SpO2:   98%     Right leg unchanged multiple ulcers Cool no real doppler flow  A: Ischemic right leg.   P: Right AKA on Monday if pt competent to consent.  Fabienne Bruns, MD [facility] Office: [phone] Pager: [phone]

## 2014-10-03 NOTE — Progress Notes (Signed)
PT Cancellation Note  Patient Details Name: [name] MRN: [id number] DOB: 1965/07/24   Cancelled Treatment:    Reason Eval/Treat Not Completed: Medical issues which prohibited therapy.  Pt scheduled for OR on Monday for AKA. Will need new orders for therapy and OT will need to reassess at that time. Please update orders as appropriate.  10/03/2014  [place] Bing, PT [phone]  (pager)  Andrea Ferrer, Eliseo Gum 10/03/2014, 5:51 PM

## 2014-10-03 NOTE — Progress Notes (Signed)
OT Cancellation Note  Patient Details Name: [name] MRN: [id number] DOB: 08-10-1965   Cancelled Treatment:    Reason Eval/Treat Not Completed: Medical issues which prohibited therapy. Pt scheduled for OR on Monday for AKA. Will need new orders for therapy and OT will need to reassess at that time. Please update orders as appropriate.   [name] M 10/03/2014, 2:35 PM

## 2014-10-03 NOTE — Progress Notes (Signed)
[place] TEAM 1 - Stepdown/ICU TEAM Progress Note  Christ Fullenwider ZOX:[id number] DOB: Nov 09, 1965 DOA: 09/24/2014  PCP: [name], NP  Admit HPI / Brief Narrative: 49 y.o. M Hx hypertension, hyperlipidemia, diabetes mellitus, GERD, CAD S/P CABG 2010, combined systolic and diastolic congestive heart failure (EF 20-25% with grade 3 diastolic dysfunction), chronic kidney disease stage IV, severe PAD, and substance abuse who presented with right leg swelling and pain for several days. His legs became red from foot to upper thigh medially. He did not have fever or chills. Patient was unable to ambulate secondary to pain.   In ED patient was found to have WBC 13.8, tachycardia, normal temperature, stable blood pressure, AoCKD-IV, negative chest x-ray for acute abnormalities, ALP 168, AST 27, ALT 15, total bilirubin 4.4, lactate 1.85, INR 1.39, urinalysis with small amount of leukocytes.   HPI/Subjective: The patient is in bed at present.  He complains of severe uncontrolled pain in his right lower extremity but is quite sleepy and appears to be sedated with pain medication at time of my visit.  He denies chest pain shortness of breath or abdominal pain.  Assessment/Plan:  Metabolic Encephalopathy -Multifactorial to include uremia, worsening cardiac function, sedating medication -Uremia now essentially resolved w/ ongoing HD  -Minimize pain medication as able recognizing patient likely does have true pain  New ESRD on chronic renal failure  -Previous creatinine 5.85 on 03/11/14 -Ongoing hemodialysis per nephrology team  PAD - R LE ischemic  -Patient had arterial Doppler 2013, which showed right and left anterior tibial arteries and Left SFA-occluded; Right CIA-<50% diameter reduction; Right Deep Profunda-70-99% diameter reduction; Right SFA->60% diameter reduction; Right Distal Popliteal/Tibial Artery- >60% diameter reduction; Left CFA and Profunda- >50% diameter reduction. -Vasc Surgery  following - to begin w/ ABIs  Bilateral lower extremity cellulitis w/ sepsis due to unspecified organism -The acute infectious component of his lower extremity disease appears to have been adequately treated at this time  Jaundice  -Resolving -Acute Hepatitis panel negative -RPR negative -anti-nuclear antibody negative -HIV negative -will need to image liver parenchyma when more stable  CAD s/p CABG 2010 -EKG showed old left bundle blockage -no findings to suggest ACS at this time   Chronic Systolic CHF / Cardiomyopathy  -TTE 02/21/14 showed EF 20-25% with grade 3 diastolic dysfunction.  -seen by Dr. Royann Shivers on 08/07/14 w/ decision regarding cardiac device therapy depending heavily on his decision regarding dialysis - ICD was to be considered if he started dialysis. -6/3 TTE shows cardiomyopathy with worsening EF now at 20%  -consider cardiac reeval if tolerates and commits to ongiong HD  HTN -Blood pressures currently well-controlled   Hyperlipidemia:  -lipid panel abnormal  DM type-II  -6/3 A1c 6.4 - CBG currently controlled   Hypokalemia Resolved   Code Status: FULL Family Communication: no family present at time of exam Disposition Plan: SDU - ongoing acute HD   Consultants: Nephrology Vasc Surgery  Procedure/Significant Events: 6/3 TTE mild LVH - EF= 20%. Diffuse hypokinesis - Left atrium:  moderately dilated - Right ventricle: mildly to moderately dilated. - Right atrium: The atrium was mildly dilated.  Antibiotics: Vancomycin 6 /1 > Zosyn 6/1 >  DVT prophylaxis: Subcutaneous heparin  Objective: Blood pressure 121/66, pulse 72, temperature 98.3 F (36.8 C), temperature source Oral, resp. rate 17, height  (1.803 m), weight 79.6 kg (175 lb 7.8 oz), SpO2 100 %.  Intake/Output Summary (Last 24 hours) at 10/03/14 1609 Last data filed at 10/03/14 1542  Gross per 24  hour  Intake 1848.31 ml  Output    260 ml  Net 1588.31 ml   Exam: General: No  acute respiratory distress - sedate as discussed above Lungs: Clear to auscultation bilaterally without wheezes or crackles Cardiovascular: Regular rate and rhythm without murmur gallop or rub   Abdomen: Nontender, soft, bowel sounds hypoactive, no rebound, no appreciable mass Extremities: No significant cyanosis, clubbing, or edema bilateral lower extremities Cutaneous:  Multiple R LE cutaneous superficial wounds w/ areas of dark eschar without significant change - anterior tibial ulceration L LE  Data Reviewed: Basic Metabolic Panel:  Recent Labs Lab 09/27/14 0236 09/28/14 0540 09/29/14 0013 09/29/14 1036 09/29/14 2301 09/30/14 1303 10/01/14 0250 10/02/14 0238 10/03/14 0235  NA 140 143 144  --  140 140 139 137 136  K 3.4* 4.2 3.9  --  3.8 3.6 4.6 3.7 3.7  CL 100* 102 103  --  101 99* 103 99* 96*  CO2 21* 19* 19*  --  17* 22 15* 20* 23  GLUCOSE 98 78 109*  --  162* 112* 127* 161* 171*  BUN 130* 140* 144*  --  67* 34* 47* 51* 31*  CREATININE 7.35* 7.52* 7.82*  --  4.65* 3.12* 3.96* 4.63* 3.68*  CALCIUM 8.2* 8.3* 8.1*  --  8.2* 8.3* 8.3* 8.2* 8.2*  MG 2.3 2.4 2.5*  --  2.2  --   --   --  2.0  PHOS  --   --   --  7.4*  --  3.8  --  5.7*  --    Liver Function Tests:  Recent Labs Lab 09/27/14 0236 09/28/14 0540 09/29/14 0013 09/29/14 2301 09/30/14 1303 10/02/14 0238 10/03/14 0235  AST 17 25 19 29   --   --  22  ALT 11* 12* 9* 12*  --   --  12*  ALKPHOS 99 96 80 90  --   --  90  BILITOT 3.2* 3.4* 3.4* 4.2*  --   --  2.8*  PROT 5.8* 6.0* 5.6* 6.2*  --   --  5.9*  ALBUMIN 2.2* 2.2* 2.0* 2.3* 2.3* 2.2* 2.2*   CBC:  Recent Labs Lab 09/27/14 0236 09/28/14 0540 09/29/14 0013 09/30/14 0510 09/30/14 1303 10/01/14 0250 10/02/14 0238 10/03/14 0235  WBC 11.7* 12.5* 12.2* 13.1* 14.7* 13.5* 10.8* 9.8  NEUTROABS 10.8* 11.2* 11.2*  --   --   --   --   --   HGB 10.4* 11.3* 10.8* 11.3* 12.8* 11.9* 11.1* 10.5*  HCT 31.4* 34.9* 33.5* 35.2* 39.1 35.3* 33.8* 32.4*  MCV 87.0  87.7 88.6 86.7 87.1 86.1 86.9 86.9  PLT 156 182 172 184 177 165 171 154   CBG:  Recent Labs Lab 10/02/14 1928 10/02/14 2338 10/03/14 0337 10/03/14 0748 10/03/14 1150  GLUCAP 155* 171* 151* 107* 185*    Recent Results (from the past 240 hour(s))  Blood Culture (routine x 2)     Status: None   Collection Time: 09/24/14  7:17 PM  Result Value Ref Range Status   Specimen Description BLOOD RIGHT ARM  Final   Special Requests BOTTLES DRAWN AEROBIC AND ANAEROBIC 5CC  Final   Culture   Final    NO GROWTH 5 DAYS Performed at [facility]    Report Status 10/01/2014 FINAL  Final  Blood Culture (routine x 2)     Status: None   Collection Time: 09/24/14  7:17 PM  Result Value Ref Range Status   Specimen Description BLOOD LEFT FOREARM  Final   Special Requests BOTTLES DRAWN AEROBIC AND ANAEROBIC 5CC  Final   Culture   Final    NO GROWTH 5 DAYS Performed at [facility]    Report Status 10/01/2014 FINAL  Final  Urine culture     Status: None   Collection Time: 09/24/14  7:23 PM  Result Value Ref Range Status   Specimen Description URINE, RANDOM  Final   Special Requests NONE  Final   Colony Count   Final    8,000 COLONIES/ML Performed at [facility]    Culture   Final    INSIGNIFICANT GROWTH Performed at [facility]    Report Status 09/25/2014 FINAL  Final  MRSA PCR Screening     Status: None   Collection Time: 09/25/14  7:00 AM  Result Value Ref Range Status   MRSA by PCR NEGATIVE NEGATIVE Final    Comment:        The GeneXpert MRSA Assay (FDA approved for NASAL specimens only), is one component of a comprehensive MRSA colonization surveillance program. It is not intended to diagnose MRSA infection nor to guide or monitor treatment for MRSA infections.      Studies:  Recent x-ray studies have been reviewed in detail by the Attending Physician  Scheduled Meds:  Scheduled Meds: . feeding supplement (NEPRO CARB STEADY)  237  mL Oral BID BM  . feeding supplement (PRO-STAT SUGAR FREE 64)  30 mL Oral BID  . ferumoxytol  510 mg Intravenous Q Tue-HD  . folic acid  1 mg Intravenous Daily  . hydrALAZINE  10 mg Intravenous 6 times per day  . insulin aspart  0-9 Units Subcutaneous 6 times per day  . metoprolol  5 mg Intravenous 4 times per day  . multivitamin  1 tablet Oral QHS  . pantoprazole (PROTONIX) IV  40 mg Intravenous Q24H  . piperacillin-tazobactam (ZOSYN)  IV  2.25 g Intravenous 3 times per day  . sodium chloride  3 mL Intravenous Q12H  . thiamine  100 mg Intravenous Daily  . [START ON 10/04/2014] vancomycin  750 mg Intravenous Q Sat-HD    Time spent on care of this patient: 35 mins  Lonia Blood, MD [facility] For Consults/Admissions - Flow Manager - [phone] Office  [phone]  Contact MD directly via text page:      [email or web address]      password [email or web address]  10/03/2014, 4:09 PM   LOS: 9 days

## 2014-10-03 NOTE — Progress Notes (Addendum)
ANTICOAGULATION & ANTIBIOTIC CONSULT NOTE - Follow Up Consult  Pharmacy Consult for Heparin & Vancomycin + Zosyn Indication: atrial fibrillation & RLE cellulitis/gangrene (needs amputation)  No Known Allergies  Patient Measurements: Height: 5\' 11"  (180.3 cm) Weight: 175 lb 7.8 oz (79.6 kg) IBW/kg (Calculated) : 75.3 Heparin Dosing Weight: 80 kg  Vital Signs: Temp: 98.3 F (36.8 C) (06/10 0326) Temp Source: Oral (06/10 0326) BP: 132/65 mmHg (06/10 0337) Pulse Rate: 81 (06/10 0337)  Labs:  Recent Labs  09/30/14 1303  10/01/14 0250 10/02/14 0238 10/02/14 1200 10/02/14 2024 10/03/14 0235  HGB 12.8*  --  11.9* 11.1*  --   --  10.5*  HCT 39.1  --  35.3* 33.8*  --   --  32.4*  PLT 177  --  165 171  --   --  154  LABPROT  --   --  16.7*  --   --   --   --   INR  --   --  1.34  --   --   --   --   HEPARINUNFRC  --   < > 0.13* 0.51 0.77* 0.60 0.36  CREATININE 3.12*  --  3.96* 4.63*  --   --  3.68*  TROPONINI 0.11*  --   --   --   --   --   --   < > = values in this interval not displayed.  Estimated Creatinine Clearance: 25.6 mL/min (by C-G formula based on Cr of 3.68).   Medications:  Heparin @ 1550 units/hr (15.5 ml/hr)  Assessment: 49 YOM who was noted to be in VT with HR in low 200's on 6/6 late PM - s/p cardioversion and started on amiodarone drip >> the patient was then noted to be in Afib.  Pharmacy on board to dose heparin for anticoagulation (CHADVASC score = 4)  Heparin level this morning is therapeutic (HL 0.36 << 0.6, goal of 0.3-0.7). Hgb/Hct slight drop, plts wnl. No overt s/sx of bleeding noted.  The patient is also continuing on Vancomycin + Zosyn for RLE cellulitis/gangrene. VVS on board and recommends AKA but patient currently refusing. Last Vanc level was checked post-HD on 6/7 and was 27 mcg/ml - the patient was subsequently dialyzed on 6/9 and a scheduled maintenance dose was given. Estimated levels are currently at goal. Will continue to monitor HD plans  and enter maintenance doses based on HD schedule.   Goal of Therapy:  Heparin level 0.3-0.7 units/ml Monitor platelets by anticoagulation protocol: Yes   Plan:  1. Continue Heparin at 1550 units/hr (15.5 ml/hr) 2. No standing Vancomycin doses currently - will follow-up with scheduled HD sessions and enter maintenance doses as needed. 3. Continue Zoysn 2.25g IV every 8 hours 4. Will continue to monitor for any signs/symptoms of bleeding and will follow up with heparin level in the a.m.  5. Will continue to follow HD schedule/duration, culture results, LOT, and antibiotic de-escalation plans   Georgina Pillion, PharmD, BCPS Clinical Pharmacist Pager: [phone] 10/03/2014 7:47 AM    --------------------------------------------------------------------------- Addendum:  Renal has rounded on the patient and are planning to take the patient for an HD session on 6/11. Will enter a Vancomycin MD to be given after this HD session.  Plan 1. Vancomycin 750 mg IV x 1 dose post HD on Sat, 6/11 2. Will follow-up with additional HD sessions to enter additional doses.   Georgina Pillion, PharmD, BCPS Clinical Pharmacist Pager: [phone] 10/03/2014 2:56 PM

## 2014-10-03 NOTE — Progress Notes (Signed)
Medicare Important Message given? YES (If response is "NO", the following Medicare IM given date fields will be blank) Date Medicare IM given:10/03/14 Medicare IM given by: Shivaan Tierno 

## 2014-10-04 LAB — GLUCOSE, CAPILLARY
GLUCOSE-CAPILLARY: 148 mg/dL — AB (ref 65–99)
GLUCOSE-CAPILLARY: 171 mg/dL — AB (ref 65–99)
Glucose-Capillary: 114 mg/dL — ABNORMAL HIGH (ref 65–99)
Glucose-Capillary: 161 mg/dL — ABNORMAL HIGH (ref 65–99)
Glucose-Capillary: 151 mg/dL — ABNORMAL HIGH (ref 65–99)
Glucose-Capillary: 213 mg/dL — ABNORMAL HIGH (ref 65–99)

## 2014-10-04 LAB — COMPREHENSIVE METABOLIC PANEL
ALBUMIN: 2.2 g/dL — AB (ref 3.5–5.0)
ALK PHOS: 92 U/L (ref 38–126)
ALT: 13 U/L — ABNORMAL LOW (ref 17–63)
ANION GAP: 16 — AB (ref 5–15)
AST: 20 U/L (ref 15–41)
BUN: 43 mg/dL — ABNORMAL HIGH (ref 6–20)
CHLORIDE: 96 mmol/L — AB (ref 101–111)
CO2: 24 mmol/L (ref 22–32)
Calcium: 8.4 mg/dL — ABNORMAL LOW (ref 8.9–10.3)
Creatinine, Ser: 4.82 mg/dL — ABNORMAL HIGH (ref 0.61–1.24)
GFR calc Af Amer: 15 mL/min — ABNORMAL LOW (ref >60)
GFR calc non Af Amer: 13 mL/min — ABNORMAL LOW (ref >60)
GLUCOSE: 152 mg/dL — AB (ref 65–99)
POTASSIUM: 4.3 mmol/L (ref 3.5–5.1)
Sodium: 136 mmol/L (ref 135–145)
TOTAL PROTEIN: 6.2 g/dL — AB (ref 6.5–8.1)
Total Bilirubin: 2.5 mg/dL — ABNORMAL HIGH (ref 0.3–1.2)

## 2014-10-04 LAB — CBC
HEMATOCRIT: 32.3 % — AB (ref 39.0–52.0)
HEMOGLOBIN: 10.2 g/dL — AB (ref 13.0–17.0)
MCH: 27.7 pg (ref 26.0–34.0)
MCHC: 31.6 g/dL (ref 30.0–36.0)
MCV: 87.8 fL (ref 78.0–100.0)
Platelets: 129 10*3/uL — ABNORMAL LOW (ref 150–400)
RBC: 3.68 MIL/uL — AB (ref 4.22–5.81)
RDW: 15.6 % — ABNORMAL HIGH (ref 11.5–15.5)
WBC: 8.6 10*3/uL (ref 4.0–10.5)

## 2014-10-04 LAB — LIPASE, BLOOD: LIPASE: 119 U/L — AB (ref 22–51)

## 2014-10-04 LAB — HEPARIN LEVEL (UNFRACTIONATED)
HEPARIN UNFRACTIONATED: 0.41 [IU]/mL (ref 0.30–0.70)
Heparin Unfractionated: 0.28 IU/mL — ABNORMAL LOW (ref 0.30–0.70)

## 2014-10-04 MED ORDER — AMIODARONE HCL 200 MG PO TABS
400.0000 mg | ORAL_TABLET | Freq: Two times a day (BID) | ORAL | Status: DC
  Administered 2014-10-04 – 2014-10-18 (×27): 400 mg via ORAL
  Filled 2014-10-04 (×33): qty 2

## 2014-10-04 MED ORDER — MORPHINE SULFATE 2 MG/ML IJ SOLN
INTRAMUSCULAR | Status: AC
  Filled 2014-10-04: qty 1

## 2014-10-04 NOTE — Progress Notes (Signed)
ANTICOAGULATION CONSULT NOTE - Follow Up Consult  Pharmacy Consult for Heparin  Indication: atrial fibrillation  No Known Allergies  Patient Measurements: Height: 5\' 11"  (180.3 cm) Weight: 177 lb 0.5 oz (80.3 kg) IBW/kg (Calculated) : 75.3  Vital Signs: Temp: 97.9 F (36.6 C) (06/11 0412) Temp Source: Oral (06/11 0412) BP: 146/70 mmHg (06/11 0412) Pulse Rate: 73 (06/11 0412)  Labs:  Recent Labs  10/02/14 0238  10/02/14 2024 10/03/14 0235 10/04/14 0335  HGB 11.1*  --   --  10.5* 10.2*  HCT 33.8*  --   --  32.4* 32.3*  PLT 171  --   --  154 129*  HEPARINUNFRC 0.51  < > 0.60 0.36 0.28*  CREATININE 4.63*  --   --  3.68* 4.82*  < > = values in this interval not displayed.  Estimated Creatinine Clearance: 19.5 mL/min (by C-G formula based on Cr of 4.82).   Assessment: Sub-therapeutic heparin level, no issues per RN.   Goal of Therapy:  Heparin level 0.3-0.7 units/ml Monitor platelets by anticoagulation protocol: Yes   Plan:  -Increase heparin to 1650 units/hr -1300 HL -Daily CBC/HL -Monitor for bleeding  Abran Duke 10/04/2014,5:02 AM

## 2014-10-04 NOTE — Progress Notes (Signed)
UR COMPLETED  

## 2014-10-04 NOTE — Progress Notes (Signed)
[place] TEAM 1 - Stepdown/ICU TEAM Progress Note  [name] ZOX:[id number] DOB: [birth year] DOA: 09/24/2014  PCP: [name], NP  Admit HPI / Brief Narrative: 49 y.o. M Hx hypertension, hyperlipidemia, diabetes mellitus, GERD, CAD S/P CABG 2010, combined systolic and diastolic congestive heart failure (EF 20-25% with grade 3 diastolic dysfunction), chronic kidney disease stage IV, severe PAD, and substance abuse who presented with right leg swelling and pain for several days. His legs became red from foot to upper thigh medially. He did not have fever or chills. Patient was unable to ambulate secondary to pain.   In ED patient was found to have WBC 13.8, tachycardia, normal temperature, stable blood pressure, AoCKD-IV, negative chest x-ray for acute abnormalities, ALP 168, AST 27, ALT 15, total bilirubin 4.4, lactate 1.85, INR 1.39, urinalysis with small amount of leukocytes.   HPI/Subjective: The patient is resting in bed.  He does not appear uncomfortable.  He is much more alert and conversant today.  His brother and sister are at the bedside and reports that he is more alert and conversant than they have seen him since his admission.  He denies shortness of breath abdominal pain nausea vomiting fevers or chills.  He voices understanding of his discussion with Dr. Darrick Penna and is able to repeat to me that an amputation is required and planned for early next week.  Assessment/Plan:  Metabolic Encephalopathy -Multifactorial to include uremia, worsening cardiac function, sedating medication -Uremia now essentially resolved w/ ongoing HD  -Patient has been slowly but steadily improving and appears to be at his baseline mental status today  New ESRD on chronic renal failure  -Previous creatinine 5.85 on 03/11/14 -Ongoing hemodialysis per nephrology team  Episode of monomorphic VT / wide complex tachycardia  Required cardioversion 6/6 PM - on amio per EP - attempt to transition to oral  amiodarone today as previously recommended by EP  Acute onset Afib 6/6 CHADS2VASC is at least 3  PAD - R LE ischemic  -Patient had arterial Doppler 2013, which showed right and left anterior tibial arteries and Left SFA-occluded; Right CIA-<50% diameter reduction; Right Deep Profunda-70-99% diameter reduction; Right SFA->60% diameter reduction; Right Distal Popliteal/Tibial Artery- >60% diameter reduction; Left CFA and Profunda- >50% diameter reduction. -Vasc Surgery following - right lower extremity AKA planned for Monday  Bilateral lower extremity cellulitis w/ sepsis due to unspecified organism -The acute infectious component of his lower extremity disease appears to have been adequately treated at this time  Jaundice  -Resolving - total bilirubin steadily trending downward -Acute Hepatitis panel negative -RPR negative -anti-nuclear antibody negative -HIV negative -liver ultrasound to evaluate parenchyma - most recent prior ultrasound September 2015 suggesting normal liver parenchyma at that time  CAD s/p CABG 2010 -EKG showed old left bundle blockage -no findings to suggest ACS at this time   Chronic Systolic CHF / Cardiomyopathy  -TTE 02/21/14 showed EF 20-25% with grade 3 diastolic dysfunction.  -seen by Dr. Royann Shivers on 08/07/14 w/ decision regarding cardiac device therapy depending heavily on his decision regarding dialysis - ICD was to be considered if he started dialysis. -6/3 TTE shows cardiomyopathy with worsening EF now at 20%  -EP does not feel he is presently a candidate for aggressive procedures -Volume management per hemodialysis  HTN -Blood pressures currently well-controlled   Hyperlipidemia  -lipid panel abnormal  DM type-II  -6/3 A1c 6.4 - CBG currently reasonably controlled   Hypokalemia Resolved   Code Status: FULL Family Communication: Spoke with brother and sister  at bedside Disposition Plan: SDU  Consultants: Nephrology Vasc  Surgery  Procedure/Significant Events: 6/3 TTE mild LVH - EF= 20%. Diffuse hypokinesis - Left atrium:  moderately dilated - Right ventricle: mildly to moderately dilated. - Right atrium: The atrium was mildly dilated.  Antibiotics: Vancomycin 6 /1 > Zosyn 6/1 >  DVT prophylaxis: heparin  Objective: Blood pressure 120/65, pulse 63, temperature 98.1 F (36.7 C), temperature source Oral, resp. rate 16, height 5\' 11"  (1.803 m), weight 76.3 kg (168 lb 3.4 oz), SpO2 100 %.  Intake/Output Summary (Last 24 hours) at 10/04/14 1451 Last data filed at 10/04/14 1320  Gross per 24 hour  Intake 730.52 ml  Output   3130 ml  Net -2399.48 ml   Exam: General: No acute respiratory distress - alert and conversant Lungs: Clear to auscultation bilaterally without wheezes or crackles Cardiovascular: Regular rate without murmur gallop or rub   Abdomen: Nontender, soft, bowel sounds hypoactive, no rebound, no appreciable mass Extremities: No significant cyanosis, clubbing, or edema bilateral lower extremities Cutaneous:  Multiple R LE cutaneous superficial wounds w/ areas of dark eschar without significant change - anterior tibial ulceration L LE - no significant change to lower extremities  Data Reviewed: Basic Metabolic Panel:  Recent Labs Lab 09/28/14 0540 09/29/14 0013 09/29/14 1036 09/29/14 2301 09/30/14 1303 10/01/14 0250 10/02/14 0238 10/03/14 0235 10/04/14 0335  NA 143 144  --  140 140 139 137 136 136  K 4.2 3.9  --  3.8 3.6 4.6 3.7 3.7 4.3  CL 102 103  --  101 99* 103 99* 96* 96*  CO2 19* 19*  --  17* 22 15* 20* 23 24  GLUCOSE 78 109*  --  162* 112* 127* 161* 171* 152*  BUN 140* 144*  --  67* 34* 47* 51* 31* 43*  CREATININE 7.52* 7.82*  --  4.65* 3.12* 3.96* 4.63* 3.68* 4.82*  CALCIUM 8.3* 8.1*  --  8.2* 8.3* 8.3* 8.2* 8.2* 8.4*  MG 2.4 2.5*  --  2.2  --   --   --  2.0  --   PHOS  --   --  7.4*  --  3.8  --  5.7*  --   --    Liver Function Tests:  Recent Labs Lab  09/28/14 0540 09/29/14 0013 09/29/14 2301 09/30/14 1303 10/02/14 0238 10/03/14 0235 10/04/14 0335  AST 25 19 29   --   --  22 20  ALT 12* 9* 12*  --   --  12* 13*  ALKPHOS 96 80 90  --   --  90 92  BILITOT 3.4* 3.4* 4.2*  --   --  2.8* 2.5*  PROT 6.0* 5.6* 6.2*  --   --  5.9* 6.2*  ALBUMIN 2.2* 2.0* 2.3* 2.3* 2.2* 2.2* 2.2*   CBC:  Recent Labs Lab 09/28/14 0540 09/29/14 0013  09/30/14 1303 10/01/14 0250 10/02/14 0238 10/03/14 0235 10/04/14 0335  WBC 12.5* 12.2*  < > 14.7* 13.5* 10.8* 9.8 8.6  NEUTROABS 11.2* 11.2*  --   --   --   --   --   --   HGB 11.3* 10.8*  < > 12.8* 11.9* 11.1* 10.5* 10.2*  HCT 34.9* 33.5*  < > 39.1 35.3* 33.8* 32.4* 32.3*  MCV 87.7 88.6  < > 87.1 86.1 86.9 86.9 87.8  PLT 182 172  < > 177 165 171 154 129*  < > = values in this interval not displayed. CBG:  Recent Labs Lab 10/03/14 1931  10/04/14 0053 10/04/14 0407 10/04/14 0801 10/04/14 1420  GLUCAP 206* 114* 148* 161* 151*    Recent Results (from the past 240 hour(s))  Blood Culture (routine x 2)     Status: None   Collection Time: 09/24/14  7:17 PM  Result Value Ref Range Status   Specimen Description BLOOD RIGHT ARM  Final   Special Requests BOTTLES DRAWN AEROBIC AND ANAEROBIC 5CC  Final   Culture   Final    NO GROWTH 5 DAYS Performed at [facility]    Report Status 10/01/2014 FINAL  Final  Blood Culture (routine x 2)     Status: None   Collection Time: 09/24/14  7:17 PM  Result Value Ref Range Status   Specimen Description BLOOD LEFT FOREARM  Final   Special Requests BOTTLES DRAWN AEROBIC AND ANAEROBIC 5CC  Final   Culture   Final    NO GROWTH 5 DAYS Performed at [facility]    Report Status 10/01/2014 FINAL  Final  Urine culture     Status: None   Collection Time: 09/24/14  7:23 PM  Result Value Ref Range Status   Specimen Description URINE, RANDOM  Final   Special Requests NONE  Final   Colony Count   Final    8,000 COLONIES/ML Performed at [facility]    Culture   Final    INSIGNIFICANT GROWTH Performed at [facility]    Report Status 09/25/2014 FINAL  Final  MRSA PCR Screening     Status: None   Collection Time: 09/25/14  7:00 AM  Result Value Ref Range Status   MRSA by PCR NEGATIVE NEGATIVE Final    Comment:        The GeneXpert MRSA Assay (FDA approved for NASAL specimens only), is one component of a comprehensive MRSA colonization surveillance program. It is not intended to diagnose MRSA infection nor to guide or monitor treatment for MRSA infections.      Studies:  Recent x-ray studies have been reviewed in detail by the Attending Physician  Scheduled Meds:  Scheduled Meds: . feeding supplement (NEPRO CARB STEADY)  237 mL Oral BID BM  . feeding supplement (PRO-STAT SUGAR FREE 64)  30 mL Oral BID  . ferumoxytol  510 mg Intravenous Q Tue-HD  . folic acid  1 mg Oral Daily  . insulin aspart  0-9 Units Subcutaneous 6 times per day  . metoprolol tartrate  25 mg Oral BID  . morphine      . multivitamin  1 tablet Oral QHS  . pantoprazole  40 mg Oral Q1200  . piperacillin-tazobactam (ZOSYN)  IV  2.25 g Intravenous 3 times per day  . polyethylene glycol  17 g Oral BID  . senna-docusate  1 tablet Oral BID  . sodium chloride  3 mL Intravenous Q12H  . thiamine  100 mg Oral Daily    Time spent on care of this patient: 35 mins  Lonia Blood, MD [facility] For Consults/Admissions - Flow Manager - [phone] Office  [phone]  Contact MD directly via text page:      [email or web address]      password [facility]  10/04/2014, 2:51 PM   LOS: 10 days

## 2014-10-04 NOTE — Progress Notes (Signed)
S: Feels well, no new CO  O:BP 145/73 mmHg  Pulse 67  Temp(Src) 97.8 F (36.6 C) (Oral)  Resp 15  Ht 5\' 11"  (1.803 m)  Wt 80.3 kg (177 lb 0.5 oz)  BMI 24.70 kg/m2  SpO2 98%  Intake/Output Summary (Last 24 hours) at 10/04/14 0830 Last data filed at 10/04/14 0803  Gross per 24 hour  Intake 1500.52 ml  Output    180 ml  Net 1320.52 ml   Weight change: 0.8 kg (1 lb 12.2 oz) CZY:SAYTK and alert CVS:RRR Resp: clear Abd: + BS NTND Ext: 1+ edema in thighs.  Multiple ulceration Rt leg, dry NEURO: Ox3, CNI No asterixis Rt IJ permcath   . feeding supplement (NEPRO CARB STEADY)  237 mL Oral BID BM  . feeding supplement (PRO-STAT SUGAR FREE 64)  30 mL Oral BID  . ferumoxytol  510 mg Intravenous Q Tue-HD  . folic acid  1 mg Oral Daily  . insulin aspart  0-9 Units Subcutaneous 6 times per day  . metoprolol tartrate  25 mg Oral BID  . multivitamin  1 tablet Oral QHS  . pantoprazole  40 mg Oral Q1200  . piperacillin-tazobactam (ZOSYN)  IV  2.25 g Intravenous 3 times per day  . polyethylene glycol  17 g Oral BID  . senna-docusate  1 tablet Oral BID  . sodium chloride  3 mL Intravenous Q12H  . thiamine  100 mg Oral Daily  . vancomycin  750 mg Intravenous Q Sat-HD   No results found. BMET    Component Value Date/Time   NA 136 10/04/2014 0335   K 4.3 10/04/2014 0335   CL 96* 10/04/2014 0335   CO2 24 10/04/2014 0335   GLUCOSE 152* 10/04/2014 0335   BUN 43* 10/04/2014 0335   CREATININE 4.82* 10/04/2014 0335   CALCIUM 8.4* 10/04/2014 0335   GFRNONAA 13* 10/04/2014 0335   GFRAA 15* 10/04/2014 0335   CBC    Component Value Date/Time   WBC 8.6 10/04/2014 0335   RBC 3.68* 10/04/2014 0335   HGB 10.2* 10/04/2014 0335   HCT 32.3* 10/04/2014 0335   PLT 129* 10/04/2014 0335   MCV 87.8 10/04/2014 0335   MCH 27.7 10/04/2014 0335   MCHC 31.6 10/04/2014 0335   RDW 15.6* 10/04/2014 0335   LYMPHSABS 0.8 09/29/2014 0013   MONOABS 0.2 09/29/2014 0013   EOSABS 0.0 09/29/2014 0013   BASOSABS 0.0 09/29/2014 0013     Assessment: 1.  New ESRD 2. Anemia.  Receiving feraheme 3. DM 4. Cardiomyopathy EF 20% 5. Ischemic Rt leg.  Note plans for BKA Mon 6. Mild thrombocytopenia prob from heparin   Plan: 1. Plan HD today 2.  He understands that a BKA is needed and is willing to proceed 3. Show him Dx videos 4. DC foley Hancel Ion T

## 2014-10-04 NOTE — Progress Notes (Signed)
ANTICOAGULATION CONSULT NOTE - Follow Up Consult  Pharmacy Consult for Heparin  Indication: atrial fibrillation  No Known Allergies  Patient Measurements: Height: 5\' 11"  (180.3 cm) Weight: 168 lb 3.4 oz (76.3 kg) IBW/kg (Calculated) : 75.3  Vital Signs: Temp: 98.1 F (36.7 C) (06/11 1320) Temp Source: Oral (06/11 1320) BP: 120/65 mmHg (06/11 1320) Pulse Rate: 63 (06/11 1320)  Labs:  Recent Labs  10/02/14 0238  10/03/14 0235 10/04/14 0335 10/04/14 1525  HGB 11.1*  --  10.5* 10.2*  --   HCT 33.8*  --  32.4* 32.3*  --   PLT 171  --  154 129*  --   HEPARINUNFRC 0.51  < > 0.36 0.28* 0.41  CREATININE 4.63*  --  3.68* 4.82*  --   < > = values in this interval not displayed.  Estimated Creatinine Clearance: 19.5 mL/min (by C-G formula based on Cr of 4.82).   Assessment: CC: progressive weakness and bilateral leg swelling with weeping edema   PMH of hypertension, hyperlipidemia, diabetes mellitus, GERD, CAD (s/p CABG 2010), combined systolic and diastolic congestive heart failure (EF 20-25% with grade 3 diastolic dysfunction), chronic kidney disease-stage IV, severe PAD, substance abuse  Anticoagulation: Heparin for new Afib. Heparin level 0.41, at goal.   Goal of Therapy:  Heparin level 0.3-0.7 units/ml Monitor platelets by anticoagulation protocol: Yes   Plan:  -Continue heparin at 1650 units/hr -Daily CBC/HL -Monitor for bleeding  Isaac Bliss, PharmD, BCPS Clinical Pharmacist Pager [phone] 10/04/2014 4:14 PM

## 2014-10-05 ENCOUNTER — Inpatient Hospital Stay (HOSPITAL_COMMUNITY): Payer: Medicare Other

## 2014-10-05 LAB — CBC
HCT: 32.2 % — ABNORMAL LOW (ref 39.0–52.0)
Hemoglobin: 10.3 g/dL — ABNORMAL LOW (ref 13.0–17.0)
MCH: 28.2 pg (ref 26.0–34.0)
MCHC: 32 g/dL (ref 30.0–36.0)
MCV: 88.2 fL (ref 78.0–100.0)
Platelets: 143 10*3/uL — ABNORMAL LOW (ref 150–400)
RBC: 3.65 MIL/uL — AB (ref 4.22–5.81)
RDW: 15.8 % — AB (ref 11.5–15.5)
WBC: 10.1 10*3/uL (ref 4.0–10.5)

## 2014-10-05 LAB — GLUCOSE, CAPILLARY
GLUCOSE-CAPILLARY: 82 mg/dL (ref 65–99)
GLUCOSE-CAPILLARY: 155 mg/dL — AB (ref 65–99)
Glucose-Capillary: 107 mg/dL — ABNORMAL HIGH (ref 65–99)
Glucose-Capillary: 91 mg/dL (ref 65–99)
Glucose-Capillary: 150 mg/dL — ABNORMAL HIGH (ref 65–99)
Glucose-Capillary: 136 mg/dL — ABNORMAL HIGH (ref 65–99)

## 2014-10-05 LAB — COMPREHENSIVE METABOLIC PANEL
ALT: 11 U/L — ABNORMAL LOW (ref 17–63)
ANION GAP: 15 (ref 5–15)
AST: 7 U/L — ABNORMAL LOW (ref 15–41)
Albumin: 2.3 g/dL — ABNORMAL LOW (ref 3.5–5.0)
Alkaline Phosphatase: 107 U/L (ref 38–126)
BUN: 38 mg/dL — ABNORMAL HIGH (ref 6–20)
CALCIUM: 8.2 mg/dL — AB (ref 8.9–10.3)
CO2: 23 mmol/L (ref 22–32)
Chloride: 96 mmol/L — ABNORMAL LOW (ref 101–111)
Creatinine, Ser: 4.09 mg/dL — ABNORMAL HIGH (ref 0.61–1.24)
GFR calc Af Amer: 18 mL/min — ABNORMAL LOW (ref >60)
GFR, EST NON AFRICAN AMERICAN: 16 mL/min — AB (ref >60)
Glucose, Bld: 75 mg/dL (ref 65–99)
Potassium: 4 mmol/L (ref 3.5–5.1)
SODIUM: 134 mmol/L — AB (ref 135–145)
TOTAL PROTEIN: 6.2 g/dL — AB (ref 6.5–8.1)
Total Bilirubin: 2.3 mg/dL — ABNORMAL HIGH (ref 0.3–1.2)

## 2014-10-05 LAB — HEPARIN LEVEL (UNFRACTIONATED)
HEPARIN UNFRACTIONATED: 0.39 [IU]/mL (ref 0.30–0.70)
Heparin Unfractionated: 0.12 IU/mL — ABNORMAL LOW (ref 0.30–0.70)
Heparin Unfractionated: 0.48 IU/mL (ref 0.30–0.70)

## 2014-10-05 MED ORDER — DARBEPOETIN ALFA 100 MCG/0.5ML IJ SOSY
100.0000 ug | PREFILLED_SYRINGE | INTRAMUSCULAR | Status: DC
  Administered 2014-10-08 – 2014-10-14 (×2): 100 ug via INTRAVENOUS
  Filled 2014-10-05 (×2): qty 0.5

## 2014-10-05 NOTE — Progress Notes (Addendum)
ANTICOAGULATION CONSULT NOTE - Follow Up Consult  Pharmacy Consult for Heparin  Indication: atrial fibrillation  No Known Allergies  Patient Measurements: Height: 5\' 11"  (180.3 cm) Weight: 177 lb 14.6 oz (80.7 kg) IBW/kg (Calculated) : 75.3  Vital Signs: Temp: 98.4 F (36.9 C) (06/12 0404) Temp Source: Oral (06/12 0404) BP: 146/83 mmHg (06/12 0404) Pulse Rate: 67 (06/12 0404)  Labs:  Recent Labs  10/03/14 0235 10/04/14 0335 10/04/14 1525 10/05/14 0248  HGB 10.5* 10.2*  --  10.3*  HCT 32.4* 32.3*  --  32.2*  PLT 154 129*  --  143*  HEPARINUNFRC 0.36 0.28* 0.41 0.12*  CREATININE 3.68* 4.82*  --  4.09*    Estimated Creatinine Clearance: 23 mL/min (by C-G formula based on Cr of 4.09).   Assessment: Sub-therapeutic heparin level, no issues per RN.   Goal of Therapy:  Heparin level 0.3-0.7 units/ml Monitor platelets by anticoagulation protocol: Yes   Plan:  -Increase heparin to 1800 units/hr -1300 HL -Daily CBC/HL -Monitor for bleeding  Abran Duke 10/05/2014,4:52 AM  ________________________________________________ Assessment: HL now therapeutic at 0.39 after rate increase this AM.  Note, BKA planned for 6/13 at 1500.  Goal of Therapy:  Heparin level 0.3-0.7 units/ml Monitor platelets by anticoagulation protocol: Yes   Plan:  -Continue heparin gtt at 1800 units/hr -2100 HL -Monitor daily HL, CBC, s/sx bleeding -Hold heparin gtt prior to surgery, f/u plans for [facility] s/p BKA  Waynette Buttery, PharmD Clinical Pharmacy Resident Pager: [phone] 10/05/2014 2:16 PM

## 2014-10-05 NOTE — Progress Notes (Signed)
ANTICOAGULATION CONSULT NOTE - Follow Up Consult  Pharmacy Consult for Heparin  Indication: atrial fibrillation  No Known Allergies  Patient Measurements: Height: 5\' 11"  (180.3 cm) Weight: 177 lb 14.6 oz (80.7 kg) IBW/kg (Calculated) : 75.3  Vital Signs: Temp: 97.6 F (36.4 C) (06/12 1949) Temp Source: Oral (06/12 1949) BP: 141/85 mmHg (06/12 1949) Pulse Rate: 71 (06/12 1949)  Labs:  Recent Labs  10/03/14 0235 10/04/14 0335  10/05/14 0248 10/05/14 1320 10/05/14 2127  HGB 10.5* 10.2*  --  10.3*  --   --   HCT 32.4* 32.3*  --  32.2*  --   --   PLT 154 129*  --  143*  --   --   HEPARINUNFRC 0.36 0.28*  < > 0.12* 0.39 0.48  CREATININE 3.68* 4.82*  --  4.09*  --   --   < > = values in this interval not displayed.  Estimated Creatinine Clearance: 23 mL/min (by C-G formula based on Cr of 4.09).  Assessment: 49 yo M admitted 09/24/2014 with progressive weakness and bilateral leg swelling with weeping edema. Pharmacy consulted to dose heparin for afib.  Coag: Afib, on heparin while evaluating plans for amputation.  Heparin level now at goal.  Continue current rate follow up in am.  Plan: Continue current heparin rate Daily HL and CBC  Thank you for allowing pharmacy to be a part of this patients care team.  Lovenia Kim Pharm.D., BCPS, AQ-Cardiology Clinical Pharmacist 10/05/2014 9:58 PM Pager: [phone] Phone: [phone]

## 2014-10-05 NOTE — Progress Notes (Signed)
S: wants to eat.  Knows plan is for amputation tomorrow O:BP 146/83 mmHg  Pulse 67  Temp(Src) 98.4 F (36.9 C) (Oral)  Resp 13  Ht 5\' 11"  (1.803 m)  Wt 80.7 kg (177 lb 14.6 oz)  BMI 24.82 kg/m2  SpO2 100%  Intake/Output Summary (Last 24 hours) at 10/05/14 0714 Last data filed at 10/04/14 2309  Gross per 24 hour  Intake    956 ml  Output   3160 ml  Net  -2204 ml   Weight change: -1 kg (-2 lb 3.3 oz) ETK:KOECX and alert CVS:RRR Resp: clear Abd: + BS NTND Ext: tr edema in thighs.  Multiple ulceration Rt leg, dry NEURO: Ox3, CNI No asterixis Rt IJ permcath   . amiodarone  400 mg Oral BID  . feeding supplement (NEPRO CARB STEADY)  237 mL Oral BID BM  . feeding supplement (PRO-STAT SUGAR FREE 64)  30 mL Oral BID  . ferumoxytol  510 mg Intravenous Q Tue-HD  . folic acid  1 mg Oral Daily  . insulin aspart  0-9 Units Subcutaneous 6 times per day  . metoprolol tartrate  25 mg Oral BID  . multivitamin  1 tablet Oral QHS  . pantoprazole  40 mg Oral Q1200  . piperacillin-tazobactam (ZOSYN)  IV  2.25 g Intravenous 3 times per day  . polyethylene glycol  17 g Oral BID  . senna-docusate  1 tablet Oral BID  . sodium chloride  3 mL Intravenous Q12H  . thiamine  100 mg Oral Daily   No results found. BMET    Component Value Date/Time   NA 134* 10/05/2014 0248   K 4.0 10/05/2014 0248   CL 96* 10/05/2014 0248   CO2 23 10/05/2014 0248   GLUCOSE 75 10/05/2014 0248   BUN 38* 10/05/2014 0248   CREATININE 4.09* 10/05/2014 0248   CALCIUM 8.2* 10/05/2014 0248   GFRNONAA 16* 10/05/2014 0248   GFRAA 18* 10/05/2014 0248   CBC    Component Value Date/Time   WBC 10.1 10/05/2014 0248   RBC 3.65* 10/05/2014 0248   HGB 10.3* 10/05/2014 0248   HCT 32.2* 10/05/2014 0248   PLT 143* 10/05/2014 0248   MCV 88.2 10/05/2014 0248   MCH 28.2 10/05/2014 0248   MCHC 32.0 10/05/2014 0248   RDW 15.8* 10/05/2014 0248   LYMPHSABS 0.8 09/29/2014 0013   MONOABS 0.2 09/29/2014 0013   EOSABS 0.0  09/29/2014 0013   BASOSABS 0.0 09/29/2014 0013     Assessment: 1.  New ESRD 2. Anemia.  Receiving feraheme 3. DM 4. Cardiomyopathy EF 20% 5. Ischemic Rt leg.  Note plans for BKA Mon 6. Mild thrombocytopenia prob from heparin 7. Sec HPTH   PTH only 97   Plan: 1. Rt BKA in AM 2. Plan next HD tues  3. Start aranesp Keisy Strickler T

## 2014-10-05 NOTE — Progress Notes (Signed)
[facility] TEAM 1 - Stepdown/ICU TEAM Progress Note  [name] PRF:[id number] DOB: Jun 18, 1965 DOA: 09/24/2014  PCP: [name], NP  Admit HPI / Brief Narrative: 49 y.o. M Hx hypertension, hyperlipidemia, diabetes mellitus, GERD, CAD S/P CABG 2010, combined systolic and diastolic congestive heart failure (EF 20-25% with grade 3 diastolic dysfunction), chronic kidney disease stage IV, severe PAD, and substance abuse who presented with right leg swelling and pain for several days. His legs became red from foot to upper thigh medially. He did not have fever or chills. Patient was unable to ambulate secondary to pain.   In ED patient was found to have WBC 13.8, tachycardia, normal temperature, stable blood pressure, AoCKD-IV, negative chest x-ray for acute abnormalities, ALP 168, AST 27, ALT 15, total bilirubin 4.4, lactate 1.85, INR 1.39, urinalysis with small amount of leukocytes.   HPI/Subjective: The pt is eating his lunch w/o difficulty.  He is alert and oriented.  He continues to c/o pain in his R leg, and in his low back.  He denies cp, sob, n/v, or abdom pain.  He confirms to me he is ready to proceed with surgery tomorrow.  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  -Previous creatinine 5.85 on 03/11/14 -Ongoing hemodialysis per nephrology team via R chest HD cath   Episode of monomorphic VT / wide complex tachycardia  Required cardioversion 6/6 PM - on amio per EP - now transitioned to oral amio as recommended by EP - maintaining sinus rhythm   Acute onset Afib 6/6 CHADS2VASC is at least 3 - currently maintaining sinus rhythm   PAD - R LE ischemic  -Patient had arterial Doppler 2013, which showed right and left anterior tibial arteries and Left  SFA-occluded; Right CIA-<50% diameter reduction; Right Deep Profunda-70-99% diameter reduction; Right SFA->60% diameter reduction; Right Distal Popliteal/Tibial Artery- >60% diameter reduction; Left CFA and Profunda- >50% diameter reduction. -Vasc Surgery following - right lower extremity AKA planned for Monday  Bilateral lower extremity cellulitis w/ sepsis due to unspecified organism -The acute infectious component of his lower extremity disease appears to have been adequately treated at this time - will plan to d/c abx after amputation completed  Jaundice  -Resolving - total bilirubin steadily trending downward -Acute Hepatitis panel negative -RPR negative -anti-nuclear antibody negative -HIV negative -Hepatic ultrasound reveals normal appearing parenchyma with ascites and (likely due to ESRD)   CAD s/p CABG 2010 -EKG showed old left bundle blockage -no findings to suggest ACS at this time   Chronic Systolic CHF / Cardiomyopathy  -TTE 02/21/14 showed EF 20-25% with grade 3 diastolic dysfunction.  -seen by Dr. Royann Shivers on 08/07/14 w/ decision regarding cardiac device therapy depending heavily on his decision regarding dialysis - ICD was to be considered if he started dialysis. -6/3 TTE shows cardiomyopathy with worsening EF now at 20%  -EP does not feel he is presently a candidate for aggressive procedures -Volume management per hemodialysis  HTN -Blood pressures currently reasonably controlled   Hyperlipidemia  -lipid panel abnormal  DM type-II  -6/3 A1c 6.4 - CBG currently reasonably controlled   Hypokalemia Resolved   Code Status: FULL Family Communication: no family present at time of exam today  Disposition Plan: SDU - to OR in AM   Consultants: Nephrology Vasc Surgery EP  Procedure/Significant Events:  6/3 TTE mild LVH - EF= 20%. Diffuse hypokinesis - Left atrium:  moderately dilated - Right ventricle: mildly to moderately dilated. - Right atrium: The atrium was  mildly dilated.  Antibiotics: Vancomycin 6 /1 > Zosyn 6/1 >  DVT prophylaxis: IV heparin  Objective: Blood pressure 141/73, pulse 63, temperature 98.4 F (36.9 C), temperature source Oral, resp. rate 13, height  (1.803 m), weight 80.7 kg (177 lb 14.6 oz), SpO2 100 %.  Intake/Output Summary (Last 24 hours) at 10/05/14 1328 Last data filed at 10/04/14 2309  Gross per 24 hour  Intake    956 ml  Output    130 ml  Net    826 ml   Exam: General: No acute respiratory distress - alert/conversant Lungs: Clear to auscultation bilaterally without wheezes/crackles Cardiovascular: Regular rate and rhythm without murmur gallop or rub   Abdomen: Nontender, mildly protuberant, soft, bowel sounds hypoactive, no rebound, no appreciable mass Extremities: No significant cyanosis, clubbing, or edema bilateral lower extremities Cutaneous:  multiple R LE cutaneous superficial wounds w/ areas of dark eschar without change - stable anterior tibial ulceration L LE  Data Reviewed: Basic Metabolic Panel:  Recent Labs Lab 09/29/14 0013 09/29/14 1036 09/29/14 2301 09/30/14 1303 10/01/14 0250 10/02/14 0238 10/03/14 0235 10/04/14 0335 10/05/14 0248  NA 144  --  140 140 139 137 136 136 134*  K 3.9  --  3.8 3.6 4.6 3.7 3.7 4.3 4.0  CL 103  --  101 99* 103 99* 96* 96* 96*  CO2 19*  --  17* 22 15* 20* GLUCOSE 109*  --  162* 112* 127* 161* 171* 152* 75  BUN 144*  --  67* 34* 47* 51* 31* 43* 38*  CREATININE 7.82*  --  4.65* 3.12* 3.96* 4.63* 3.68* 4.82* 4.09*  CALCIUM 8.1*  --  8.2* 8.3* 8.3* 8.2* 8.2* 8.4* 8.2*  MG 2.5*  --  2.2  --   --   --  2.0  --   --   PHOS  --  7.4*  --  3.8  --  5.7*  --   --   --    Liver Function Tests:  Recent Labs Lab 09/29/14 0013 09/29/14 2301 09/30/14 1303 10/02/14 0238 10/03/14 0235 10/04/14 0335 10/05/14 0248  AST 19 29  --   --  22 20 7*  ALT 9* 12*  --   --  12* 13* 11*  ALKPHOS 80 90  --   --  90 92 107  BILITOT 3.4* 4.2*  --   --   2.8* 2.5* 2.3*  PROT 5.6* 6.2*  --   --  5.9* 6.2* 6.2*  ALBUMIN 2.0* 2.3* 2.3* 2.2* 2.2* 2.2* 2.3*   CBC:  Recent Labs Lab 09/29/14 0013  10/01/14 0250 10/02/14 0238 10/03/14 0235 10/04/14 0335 10/05/14 0248  WBC 12.2*  < > 13.5* 10.8* 9.8 8.6 10.1  NEUTROABS 11.2*  --   --   --   --   --   --   HGB 10.8*  < > 11.9* 11.1* 10.5* 10.2* 10.3*  HCT 33.5*  < > 35.3* 33.8* 32.4* 32.3* 32.2*  MCV 88.6  < > 86.1 86.9 86.9 87.8 88.2  PLT 172  < > 165 171 154 129* 143*  < > = values in this interval not displayed. CBG:  Recent Labs Lab 10/04/14 2022 10/04/14 2301 10/05/14 0402 10/05/14 0855 10/05/14 1209  GLUCAP 213* 107* 82 91 155*   Studies:  Recent x-ray studies have been reviewed in detail by the Attending Physician  Scheduled Meds:  Scheduled Meds: . amiodarone  400 mg Oral BID  . [START ON 10/07/2014] darbepoetin (ARANESP) injection - DIALYSIS  100 mcg Intravenous Q Tue-HD  . feeding supplement (NEPRO CARB STEADY)  237 mL Oral BID BM  . feeding supplement (PRO-STAT SUGAR FREE 64)  30 mL Oral BID  . ferumoxytol  510 mg Intravenous Q Tue-HD  . folic acid  1 mg Oral Daily  . insulin aspart  0-9 Units Subcutaneous 6 times per day  . metoprolol tartrate  25 mg Oral BID  . multivitamin  1 tablet Oral QHS  . pantoprazole  40 mg Oral Q1200  . piperacillin-tazobactam (ZOSYN)  IV  2.25 g Intravenous 3 times per day  . polyethylene glycol  17 g Oral BID  . senna-docusate  1 tablet Oral BID  . sodium chloride  3 mL Intravenous Q12H  . thiamine  100 mg Oral Daily    Time spent on care of this patient: 25 mins  Lonia Blood, MD [facility] For Consults/Admissions - Flow Manager - [phone] Office  [phone]  Contact MD directly via text page:      [email or web address]      password [facility]  10/05/2014, 1:28 PM   LOS: 11 days

## 2014-10-05 NOTE — Progress Notes (Signed)
Pt bladder scanned >999 obs in bladder, I/O done pt with urine return of 5 ml of urine, pts [place] of ABD stated diffuse ascites

## 2014-10-05 NOTE — Progress Notes (Addendum)
Patient ID: [name], male   DOB: 1965-11-26, 49 y.o.   MRN: [id number] Comfortable. Alert and oriented. Eating breakfast. Right foot with the dry gangrenous changes. Discussed need for right above-knee amputation tomorrow with Dr. Darrick Penna with the patient. He understands and is ready to proceed Is having successful hemodialysis via his catheter

## 2014-10-06 ENCOUNTER — Inpatient Hospital Stay (HOSPITAL_COMMUNITY): Payer: Medicare Other | Admitting: Anesthesiology

## 2014-10-06 ENCOUNTER — Encounter (HOSPITAL_COMMUNITY): Payer: Self-pay | Admitting: Critical Care Medicine

## 2014-10-06 ENCOUNTER — Encounter (HOSPITAL_COMMUNITY)
Admission: EM | Disposition: A | Discharge status: Skilled Nursing Facility | Payer: Self-pay | Source: Home / Self Care | Attending: Internal Medicine

## 2014-10-06 HISTORY — PX: AMPUTATION: SHX166

## 2014-10-06 LAB — RENAL FUNCTION PANEL
ALBUMIN: 2.1 g/dL — AB (ref 3.5–5.0)
ANION GAP: 15 (ref 5–15)
BUN: 52 mg/dL — AB (ref 6–20)
CHLORIDE: 98 mmol/L — AB (ref 101–111)
CO2: 21 mmol/L — ABNORMAL LOW (ref 22–32)
Calcium: 8.2 mg/dL — ABNORMAL LOW (ref 8.9–10.3)
Creatinine, Ser: 5 mg/dL — ABNORMAL HIGH (ref 0.61–1.24)
GFR calc non Af Amer: 12 mL/min — ABNORMAL LOW (ref >60)
GFR, EST AFRICAN AMERICAN: 14 mL/min — AB (ref >60)
GLUCOSE: 167 mg/dL — AB (ref 65–99)
PHOSPHORUS: 5.1 mg/dL — AB (ref 2.5–4.6)
POTASSIUM: 4.1 mmol/L (ref 3.5–5.1)
Sodium: 134 mmol/L — ABNORMAL LOW (ref 135–145)

## 2014-10-06 LAB — GLUCOSE, CAPILLARY
GLUCOSE-CAPILLARY: 134 mg/dL — AB (ref 65–99)
GLUCOSE-CAPILLARY: 148 mg/dL — AB (ref 65–99)
GLUCOSE-CAPILLARY: 150 mg/dL — AB (ref 65–99)
GLUCOSE-CAPILLARY: 171 mg/dL — AB (ref 65–99)
Glucose-Capillary: 152 mg/dL — ABNORMAL HIGH (ref 65–99)
Glucose-Capillary: 135 mg/dL — ABNORMAL HIGH (ref 65–99)

## 2014-10-06 LAB — CBC
HCT: 29.9 % — ABNORMAL LOW (ref 39.0–52.0)
HEMOGLOBIN: 9.9 g/dL — AB (ref 13.0–17.0)
MCH: 28.6 pg (ref 26.0–34.0)
MCHC: 33.1 g/dL (ref 30.0–36.0)
MCV: 86.4 fL (ref 78.0–100.0)
Platelets: 123 10*3/uL — ABNORMAL LOW (ref 150–400)
RBC: 3.46 MIL/uL — ABNORMAL LOW (ref 4.22–5.81)
RDW: 15.6 % — ABNORMAL HIGH (ref 11.5–15.5)
WBC: 7 10*3/uL (ref 4.0–10.5)

## 2014-10-06 LAB — PROTIME-INR
INR: 1.37 (ref 0.00–1.49)
Prothrombin Time: 17 seconds — ABNORMAL HIGH (ref 11.6–15.2)

## 2014-10-06 LAB — HEPARIN LEVEL (UNFRACTIONATED): Heparin Unfractionated: 0.43 IU/mL (ref 0.30–0.70)

## 2014-10-06 SURGERY — AMPUTATION, ABOVE KNEE
Anesthesia: General | Site: Leg Upper | Laterality: Right

## 2014-10-06 MED ORDER — EPHEDRINE SULFATE 50 MG/ML IJ SOLN
INTRAMUSCULAR | Status: DC | PRN
  Administered 2014-10-06: 10 mg via INTRAVENOUS

## 2014-10-06 MED ORDER — MIDAZOLAM HCL 2 MG/2ML IJ SOLN
INTRAMUSCULAR | Status: AC
  Filled 2014-10-06: qty 2

## 2014-10-06 MED ORDER — LIDOCAINE HCL (CARDIAC) 20 MG/ML IV SOLN
INTRAVENOUS | Status: AC
  Filled 2014-10-06: qty 5

## 2014-10-06 MED ORDER — 0.9 % SODIUM CHLORIDE (POUR BTL) OPTIME
TOPICAL | Status: DC | PRN
  Administered 2014-10-06: 1000 mL

## 2014-10-06 MED ORDER — PROPOFOL 10 MG/ML IV BOLUS
INTRAVENOUS | Status: DC | PRN
  Administered 2014-10-06: 110 mg via INTRAVENOUS

## 2014-10-06 MED ORDER — PROPOFOL 10 MG/ML IV BOLUS
INTRAVENOUS | Status: AC
  Filled 2014-10-06: qty 20

## 2014-10-06 MED ORDER — OXYCODONE HCL 5 MG PO TABS
5.0000 mg | ORAL_TABLET | ORAL | Status: DC | PRN
  Administered 2014-10-06: 5 mg via ORAL
  Administered 2014-10-07 – 2014-10-09 (×11): 10 mg via ORAL
  Filled 2014-10-06: qty 2
  Filled 2014-10-06: qty 1
  Filled 2014-10-06 (×11): qty 2

## 2014-10-06 MED ORDER — SUCCINYLCHOLINE CHLORIDE 20 MG/ML IJ SOLN
INTRAMUSCULAR | Status: DC | PRN
  Administered 2014-10-06: 100 mg via INTRAVENOUS

## 2014-10-06 MED ORDER — MIDAZOLAM HCL 5 MG/5ML IJ SOLN
INTRAMUSCULAR | Status: DC | PRN
  Administered 2014-10-06: 1 mg via INTRAVENOUS

## 2014-10-06 MED ORDER — PHENYLEPHRINE HCL 10 MG/ML IJ SOLN
INTRAMUSCULAR | Status: DC | PRN
  Administered 2014-10-06 (×5): 80 ug via INTRAVENOUS

## 2014-10-06 MED ORDER — HEPARIN (PORCINE) IN NACL 100-0.45 UNIT/ML-% IJ SOLN
1800.0000 [IU]/h | INTRAMUSCULAR | Status: DC
  Administered 2014-10-06 – 2014-10-09 (×4): 1800 [IU]/h via INTRAVENOUS
  Filled 2014-10-06 (×10): qty 250

## 2014-10-06 MED ORDER — HEPARIN SODIUM (PORCINE) 1000 UNIT/ML DIALYSIS
20.0000 [IU]/kg | INTRAMUSCULAR | Status: DC | PRN
  Filled 2014-10-06: qty 2

## 2014-10-06 MED ORDER — FENTANYL CITRATE (PF) 100 MCG/2ML IJ SOLN
INTRAMUSCULAR | Status: DC | PRN
  Administered 2014-10-06: 50 ug via INTRAVENOUS
  Administered 2014-10-06: 100 ug via INTRAVENOUS
  Administered 2014-10-06 (×2): 50 ug via INTRAVENOUS

## 2014-10-06 MED ORDER — HYDROMORPHONE HCL 1 MG/ML IJ SOLN
0.2500 mg | INTRAMUSCULAR | Status: DC | PRN
  Administered 2014-10-06 (×2): 0.5 mg via INTRAVENOUS

## 2014-10-06 MED ORDER — FENTANYL CITRATE (PF) 250 MCG/5ML IJ SOLN
INTRAMUSCULAR | Status: AC
  Filled 2014-10-06: qty 5

## 2014-10-06 MED ORDER — HYDROMORPHONE HCL 1 MG/ML IJ SOLN
INTRAMUSCULAR | Status: AC
  Filled 2014-10-06: qty 1

## 2014-10-06 MED ORDER — LIDOCAINE HCL (CARDIAC) 20 MG/ML IV SOLN
INTRAVENOUS | Status: DC | PRN
  Administered 2014-10-06: 60 mg via INTRAVENOUS

## 2014-10-06 MED ORDER — SUCCINYLCHOLINE CHLORIDE 20 MG/ML IJ SOLN
INTRAMUSCULAR | Status: AC
  Filled 2014-10-06: qty 1

## 2014-10-06 MED ORDER — PROMETHAZINE HCL 25 MG/ML IJ SOLN
6.2500 mg | INTRAMUSCULAR | Status: DC | PRN
Start: 2014-10-06 — End: 2014-10-07

## 2014-10-06 MED ORDER — SODIUM CHLORIDE 0.9 % IV SOLN
INTRAVENOUS | Status: DC
  Administered 2014-10-06 – 2014-10-10 (×2): via INTRAVENOUS
  Administered 2014-10-10: 25 mL/h via INTRAVENOUS
  Administered 2014-10-10: 10 mL/h via INTRAVENOUS

## 2014-10-06 MED ORDER — OXYCODONE HCL 5 MG PO TABS
ORAL_TABLET | ORAL | Status: AC
  Filled 2014-10-06: qty 1

## 2014-10-06 MED ORDER — ROCURONIUM BROMIDE 50 MG/5ML IV SOLN
INTRAVENOUS | Status: AC
  Filled 2014-10-06: qty 1

## 2014-10-06 MED ORDER — OXYCODONE HCL 5 MG/5ML PO SOLN: 5.0000 mg | Freq: Once | ORAL | Status: AC | PRN

## 2014-10-06 MED ORDER — OXYCODONE HCL 5 MG PO TABS
5.0000 mg | ORAL_TABLET | Freq: Once | ORAL | Status: AC | PRN
  Administered 2014-10-06: 5 mg via ORAL

## 2014-10-06 MED ORDER — PHENYLEPHRINE HCL 10 MG/ML IJ SOLN
10.0000 mg | INTRAVENOUS | Status: DC | PRN
  Administered 2014-10-06: 80 ug/min via INTRAVENOUS

## 2014-10-06 SURGICAL SUPPLY — 39 items
BANDAGE ELASTIC 6 VELCRO ST LF (GAUZE/BANDAGES/DRESSINGS) ×2 IMPLANT
BLADE SAW RECIP 87.9 MT (BLADE) ×2 IMPLANT
BNDG COHESIVE 4X5 TAN STRL (GAUZE/BANDAGES/DRESSINGS) ×2 IMPLANT
BNDG COHESIVE 6X5 TAN STRL LF (GAUZE/BANDAGES/DRESSINGS) ×2 IMPLANT
BNDG GAUZE ELAST 4 BULKY (GAUZE/BANDAGES/DRESSINGS) ×2 IMPLANT
CANISTER SUCTION 2500CC (MISCELLANEOUS) ×2 IMPLANT
CLIP TI MEDIUM 6 (CLIP) IMPLANT
COVER SURGICAL LIGHT HANDLE (MISCELLANEOUS) ×2 IMPLANT
COVER TABLE BACK 60X90 (DRAPES) ×2 IMPLANT
DRAIN CHANNEL 19F RND (DRAIN) IMPLANT
DRAPE ORTHO SPLIT 77X108 STRL (DRAPES) ×4
DRAPE PROXIMA HALF (DRAPES) ×2 IMPLANT
DRAPE SURG ORHT 6 SPLT 77X108 (DRAPES) ×2 IMPLANT
DRSG ADAPTIC 3X8 NADH LF (GAUZE/BANDAGES/DRESSINGS) ×2 IMPLANT
ELECT REM PT RETURN 9FT ADLT (ELECTROSURGICAL) ×2
ELECTRODE REM PT RTRN 9FT ADLT (ELECTROSURGICAL) ×1 IMPLANT
EVACUATOR SILICONE 100CC (DRAIN) IMPLANT
GAUZE SPONGE 4X4 12PLY STRL (GAUZE/BANDAGES/DRESSINGS) ×2 IMPLANT
GLOVE BIO SURGEON STRL SZ7.5 (GLOVE) ×2 IMPLANT
GOWN STRL REUS W/ TWL LRG LVL3 (GOWN DISPOSABLE) ×3 IMPLANT
GOWN STRL REUS W/TWL LRG LVL3 (GOWN DISPOSABLE) ×8
KIT BASIN OR (CUSTOM PROCEDURE TRAY) ×2 IMPLANT
KIT ROOM TURNOVER OR (KITS) ×2 IMPLANT
NS IRRIG 1000ML POUR BTL (IV SOLUTION) ×2 IMPLANT
PACK GENERAL/GYN (CUSTOM PROCEDURE TRAY) ×2 IMPLANT
PAD ARMBOARD 7.5X6 YLW CONV (MISCELLANEOUS) ×4 IMPLANT
SPONGE GAUZE 4X4 12PLY STER LF (GAUZE/BANDAGES/DRESSINGS) ×1 IMPLANT
STAPLER VISISTAT 35W (STAPLE) ×2 IMPLANT
STOCKINETTE IMPERVIOUS LG (DRAPES) ×2 IMPLANT
SUT ETHILON 3 0 PS 1 (SUTURE) IMPLANT
SUT SILK 2 0 SH (SUTURE) IMPLANT
SUT SILK 2 0 SH CR/8 (SUTURE) ×1 IMPLANT
SUT SILK 2 0 TIES 10X30 (SUTURE) ×2 IMPLANT
SUT VIC AB 2-0 CT1 18 (SUTURE) ×4 IMPLANT
SUT VIC AB 2-0 SH 18 (SUTURE) ×3 IMPLANT
SUT VIC AB 3-0 SH 27 (SUTURE) ×4
SUT VIC AB 3-0 SH 27X BRD (SUTURE) ×2 IMPLANT
UNDERPAD 30X30 INCONTINENT (UNDERPADS AND DIAPERS) ×2 IMPLANT
WATER STERILE IRR 1000ML POUR (IV SOLUTION) ×1 IMPLANT

## 2014-10-06 NOTE — Progress Notes (Signed)
Medicare Important Message given? YES (If response is "NO", the following Medicare IM given date fields will be blank) Date Medicare IM given:10/06/14 Medicare IM given by: Yuliet Needs 

## 2014-10-06 NOTE — Op Note (Signed)
[facility] OPERATIVE NOTE  Procedure: Right above knee amputation  Surgeon(s): Sherren Kerns, MD  ASSISTANT: Doreatha Massed, PA-C  Anesthesia: General  Specimens: Right leg  Findings: calcified subcutaneous tissue and fascia, edematous tissues  PROCEDURE DETAIL: After obtaining informed consent, the patient was taken to the operating room. The patient was placed in supine position the operating room table. After induction of general anesthesia and endotracheal intubation the patient's Foley catheter was placed. Next patient's entire right lower extremity was prepped and draped in usual sterile fashion. A circumferential incision was made on the right leg just above the knee. The incision was carried down into the sucutaneous tissues down to level the saphenous vein. This was ligated and divided between silk ties. Soft tissues were taken down as well as the muscle and fascia with cautery. These were very calcified and edematous. The superficial femoral artery and vein were dissected free circumferentially clamped and divided. These were suture ligated proximally. The SFA was completely occluded.  Remainder of the soft tissues were taken down with cautery. The periosteum was raised on the femur approximately 5 cm above the skin edge. The femur was divided at this level. The leg was passed off the table as a specimen. Hemostasis was obtained. The wound was thoroughly irrigated with normal saline solution. The fascial edges were reapproximated using interrupted 2 0 Vicryl sutures.  The skin was closed staples. Patient tolerated procedure well and there were no complications. Instrument sponge and needle counts correct in the case. Patient was taken to recovery in stable condition.  Fabienne Bruns, MD [facility] Office: [phone] Pager: [phone]

## 2014-10-06 NOTE — Progress Notes (Signed)
SLP Cancellation Note  Patient Details Name: Pookela Bockenstedt MRN: [id number] DOB: Oct 21, 1965   Cancelled treatment:       Reason Eval/Treat Not Completed: Patient at procedure or test/unavailable . NPO for procedure.   Montzerrat Brunell, [name] 10/06/2014, 11:15 AM

## 2014-10-06 NOTE — Anesthesia Postprocedure Evaluation (Signed)
  Anesthesia Post-op Note  Patient: [name]  Procedure(s) Performed: Procedure(s): AMPUTATION ABOVE KNEE Right (Right)  Patient Location: PACU  Anesthesia Type: General   Level of Consciousness: awake, alert  and oriented  Airway and Oxygen Therapy: Patient Spontanous Breathing  Post-op Pain: mild  Post-op Assessment: Post-op Vital signs reviewed  Post-op Vital Signs: Reviewed  Last Vitals:  Filed Vitals:   10/06/14 1700  BP: 153/77  Pulse: 69  Temp: 37.3 C  Resp: 24    Complications: No apparent anesthesia complications

## 2014-10-06 NOTE — Progress Notes (Signed)
[place] TEAM 1 - Stepdown/ICU TEAM Progress Note  Gaston Harden TKP:[id number] DOB: [birth year] DOA: 09/24/2014  PCP: [name], NP  Admit HPI / Brief Narrative: 49 y.o. M Hx hypertension, hyperlipidemia, diabetes mellitus, GERD, CAD S/P CABG 2010, combined systolic and diastolic congestive heart failure (EF 20-25% with grade 3 diastolic dysfunction), chronic kidney disease stage IV, severe PAD, and substance abuse who presented with right leg swelling and pain for several days. His legs became red from foot to upper thigh medially. He did not have fever or chills. Patient was unable to ambulate secondary to pain.   In ED patient was found to have WBC 13.8, tachycardia, normal temperature, stable blood pressure, AoCKD-IV, negative chest x-ray for acute abnormalities, ALP 168, AST 27, ALT 15, total bilirubin 4.4, lactate 1.85, INR 1.39, urinalysis with small amount of leukocytes.   HPI/Subjective: The pt is confused post-op, but is stable.  He is not able to provide a reliable hx at this time.     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  -Previous creatinine 5.85 on 03/11/14 -Ongoing hemodialysis per Nephrology team via R chest HD cath   Episode of monomorphic VT / wide complex tachycardia  Required cardioversion 6/6 PM - on amio per EP - now transitioned to oral amio as recommended by EP - maintaining sinus rhythm   Acute onset Afib 6/6 CHADS2VASC is at least 3 - currently maintaining sinus rhythm - on IV heparin   PAD - R LE ischemic  Now s/p R AKA - Vasc Surgery following   Bilateral lower extremity cellulitis w/ sepsis due to unspecified organism The acute infectious component of his lower extremity disease appears to have been adequately  treated at this time - plan to d/c abx in AM as pt now s/p AKA   Jaundice  total bilirubin steadily trending downward - Acute Hepatitis panel negative -RPR negative -anti-nuclear antibody negative -HIV negative -Hepatic ultrasound reveals normal appearing parenchyma with ascites (likely due to ESRD)   CAD s/p CABG 2010  Chronic Systolic CHF / Cardiomyopathy  -TTE 02/21/14 showed EF 20-25% with grade 3 diastolic dysfunction.  -seen by Dr. Royann Shivers on 08/07/14 w/ decision regarding cardiac device therapy depending heavily on his decision regarding dialysis - ICD was to be considered if he started dialysis. -6/3 TTE shows cardiomyopathy with worsening EF now at 20%  -EP does not feel he is presently a candidate for aggressive procedures -Volume management per hemodialysis  HTN -Blood pressures currently reasonably controlled   Hyperlipidemia  -lipid panel abnormal  DM type-II  -6/3 A1c 6.4 - CBG currently reasonably controlled   Hypokalemia Resolved   Code Status: FULL Family Communication: no family present at time of exam today  Disposition Plan: SDU   Consultants: Nephrology Vasc Surgery EP  Procedure/Significant Events: 6/3 TTE mild LVH - EF= 20%. Diffuse hypokinesis - Left atrium:  moderately dilated - Right ventricle: mildly to moderately dilated. - Right atrium: The atrium was mildly dilated.  Antibiotics: Vancomycin 6 /1 > Zosyn 6/1 >  DVT prophylaxis: IV heparin  Objective: Blood pressure 138/90, pulse 68, temperature 99.1 F (37.3 C), temperature source Oral, resp. rate 22, height 5\' 11"  (1.803 m), weight 80.4 kg (177 lb 4 oz), SpO2 100 %.  Intake/Output Summary (Last 24 hours) at 10/06/14 1713 Last data  filed at 10/06/14 1616  Gross per 24 hour  Intake 1149.6 ml  Output    105 ml  Net 1044.6 ml   Exam: General: No acute respiratory distress - confused but alert post-op Lungs: Clear to auscultation bilaterally without wheezes or  crackles Cardiovascular: Regular rate and rhythm without murmur gallop rub   Abdomen: Nontender, mildly protuberant, soft, bowel sounds hypoactive, no rebound, no mass Extremities: No significant cyanosis, clubbing, or edema L LE - R AKA site dressed and dry  Cutaneous:  stable anterior tibial ulceration L LE  Data Reviewed: Basic Metabolic Panel:  Recent Labs Lab 09/29/14 2301 09/30/14 1303  10/02/14 0238 10/03/14 0235 10/04/14 0335 10/05/14 0248 10/06/14 0520  NA 140 140  < > 137 136 136 134* 134*  K 3.8 3.6  < > 3.7 3.7 4.3 4.0 4.1  CL 101 99*  < > 99* 96* 96* 96* 98*  CO2 17* 22  < > 20* 21*  GLUCOSE 162* 112*  < > 161* 171* 152* 75 167*  BUN 67* 34*  < > 51* 31* 43* 38* 52*  CREATININE 4.65* 3.12*  < > 4.63* 3.68* 4.82* 4.09* 5.00*  CALCIUM 8.2* 8.3*  < > 8.2* 8.2* 8.4* 8.2* 8.2*  MG 2.2  --   --   --  2.0  --   --   --   PHOS  --  3.8  --  5.7*  --   --   --  5.1*  < > = values in this interval not displayed.   Liver Function Tests:  Recent Labs Lab 09/29/14 2301  10/02/14 0238 10/03/14 0235 10/04/14 0335 10/05/14 0248 10/06/14 0520  AST 29  --   --  22 20 7*  --   ALT 12*  --   --  12* 13* 11*  --   ALKPHOS 90  --   --  90 92 107  --   BILITOT 4.2*  --   --  2.8* 2.5* 2.3*  --   PROT 6.2*  --   --  5.9* 6.2* 6.2*  --   ALBUMIN 2.3*  < > 2.2* 2.2* 2.2* 2.3* 2.1*  < > = values in this interval not displayed. CBC:  Recent Labs Lab 10/02/14 0238 10/03/14 0235 10/04/14 0335 10/05/14 0248 10/06/14 0520  WBC 10.8* 9.8 8.6 10.1 7.0  HGB 11.1* 10.5* 10.2* 10.3* 9.9*  HCT 33.8* 32.4* 32.3* 32.2* 29.9*  MCV 86.9 86.9 87.8 88.2 86.4  PLT 171 154 129* 143* 123*   CBG:  Recent Labs Lab 10/05/14 2340 10/06/14 0410 10/06/14 0812 10/06/14 1154 10/06/14 1347  GLUCAP 148* 152* 150* 135* 134*   Studies:  Recent x-ray studies have been reviewed in detail by the Attending Physician  Scheduled Meds:  Scheduled Meds: . amiodarone  400 mg Oral  BID  . [START ON 10/07/2014] darbepoetin (ARANESP) injection - DIALYSIS  100 mcg Intravenous Q Tue-HD  . feeding supplement (NEPRO CARB STEADY)  237 mL Oral BID BM  . feeding supplement (PRO-STAT SUGAR FREE 64)  30 mL Oral BID  . ferumoxytol  510 mg Intravenous Q Tue-HD  . folic acid  1 mg Oral Daily  . HYDROmorphone      . insulin aspart  0-9 Units Subcutaneous 6 times per day  . metoprolol tartrate  25 mg Oral BID  . multivitamin  1 tablet Oral QHS  . oxyCODONE      . pantoprazole  40 mg Oral  Q1200  . piperacillin-tazobactam (ZOSYN)  IV  2.25 g Intravenous 3 times per day  . polyethylene glycol  17 g Oral BID  . senna-docusate  1 tablet Oral BID  . sodium chloride  3 mL Intravenous Q12H  . thiamine  100 mg Oral Daily    Time spent on care of this patient: 25 mins  Lonia Blood, MD [facility] For Consults/Admissions - Flow Manager - [phone] Office  [phone]  Contact MD directly via text page:      [email or web address]      password [facility]  10/06/2014, 5:13 PM   LOS: 12 days

## 2014-10-06 NOTE — Anesthesia Preprocedure Evaluation (Addendum)
Anesthesia Evaluation  Patient identified by MRN, date of birth, ID band Patient awake    Reviewed: Allergy & Precautions, NPO status , Patient's Chart, lab work & pertinent test results  Airway Mallampati: II  TM Distance: >3 FB Neck ROM: Full    Dental  (+) Poor Dentition   Pulmonary former smoker,  breath sounds clear to auscultation        Cardiovascular hypertension, + CAD, + Past MI, + Peripheral Vascular Disease and +CHF Rhythm:Regular Rate:Normal  EF 20%   Neuro/Psych negative neurological ROS     GI/Hepatic Neg liver ROS, GERD-  ,  Endo/Other  diabetes, Type 2, Insulin Dependent  Renal/GU ESRF and DialysisRenal disease     Musculoskeletal negative musculoskeletal ROS (+)   Abdominal   Peds  Hematology  (+) anemia ,   Anesthesia Other Findings   Reproductive/Obstetrics                            Lab Results  Component Value Date   WBC 7.0 10/06/2014   HGB 9.9* 10/06/2014   HCT 29.9* 10/06/2014   MCV 86.4 10/06/2014   PLT 123* 10/06/2014   Lab Results  Component Value Date   CREATININE 5.00* 10/06/2014   BUN 52* 10/06/2014   NA 134* 10/06/2014   K 4.1 10/06/2014   CL 98* 10/06/2014   CO2 21* 10/06/2014    Anesthesia Physical Anesthesia Plan  ASA: IV  Anesthesia Plan: General   Post-op Pain Management:    Induction: Intravenous  Airway Management Planned: Oral ETT  Additional Equipment: Arterial line  Intra-op Plan:   Post-operative Plan: Extubation in OR  Informed Consent: I have reviewed the patients History and Physical, chart, labs and discussed the procedure including the risks, benefits and alternatives for the proposed anesthesia with the patient or authorized representative who has indicated his/her understanding and acceptance.   Dental advisory given  Plan Discussed with: CRNA  Anesthesia Plan Comments:         Anesthesia Quick  Evaluation

## 2014-10-06 NOTE — H&P (View-Only) (Signed)
Patient ID: [name], male   DOB: 12/23/1965, 49 y.o.   MRN: [id number] Comfortable. Alert and oriented. Eating breakfast. Right foot with the dry gangrenous changes. Discussed need for right above-knee amputation tomorrow with Dr. fields with the patient. He understands and is ready to proceed Is having successful hemodialysis via his catheter 

## 2014-10-06 NOTE — Interval H&P Note (Signed)
History and Physical Interval Note:  10/06/2014 2:41 PM  [name]  has presented today for surgery, with the diagnosis of Ischemic Right above knee amputation   The various methods of treatment have been discussed with the patient and family. After consideration of risks, benefits and other options for treatment, the patient has consented to  Procedure(s): AMPUTATION ABOVE KNEE (Right) as a surgical intervention .  The patient's history has been reviewed, patient examined, no change in status, stable for surgery.  I have reviewed the patient's chart and labs.  Questions were answered to the patient's satisfaction.     Fabienne Bruns

## 2014-10-06 NOTE — Anesthesia Procedure Notes (Signed)
Procedure Name: Intubation Date/Time: 10/06/2014 3:07 PM Performed by: Charm Barges, Dahiana Kulak R Pre-anesthesia Checklist: Patient identified, Emergency Drugs available, Suction available, Patient being monitored and Timeout performed Patient Re-evaluated:Patient Re-evaluated prior to inductionOxygen Delivery Method: Circle system utilized Preoxygenation: Pre-oxygenation with 100% oxygen Intubation Type: IV induction Ventilation: Mask ventilation without difficulty Laryngoscope Size: Mac and 4 Grade View: Grade II Tube type: Oral Tube size: 7.5 mm Number of attempts: 1 Airway Equipment and Method: Stylet Placement Confirmation: ETT inserted through vocal cords under direct vision,  positive ETCO2 and breath sounds checked- equal and bilateral Secured at: 22 cm Tube secured with: Tape Dental Injury: Teeth and Oropharynx as per pre-operative assessment

## 2014-10-06 NOTE — Progress Notes (Signed)
ANTICOAGULATION CONSULT NOTE - Follow Up Consult  Pharmacy Consult for Heparin  Indication: atrial fibrillation  No Known Allergies  Patient Measurements: Height: 5\' 11"  (180.3 cm) Weight: 177 lb 4 oz (80.4 kg) IBW/kg (Calculated) : 75.3  Vital Signs: Temp: 98 F (36.7 C) (06/13 0856) Temp Source: Oral (06/13 0856) BP: 131/67 mmHg (06/13 0410) Pulse Rate: 62 (06/13 0410)  Labs:  Recent Labs  10/04/14 0335  10/05/14 0248 10/05/14 1320 10/05/14 2127 10/06/14 0520  HGB 10.2*  --  10.3*  --   --  9.9*  HCT 32.3*  --  32.2*  --   --  29.9*  PLT 129*  --  143*  --   --  123*  LABPROT  --   --   --   --   --  17.0*  INR  --   --   --   --   --  1.37  HEPARINUNFRC 0.28*  < > 0.12* 0.39 0.48 0.43  CREATININE 4.82*  --  4.09*  --   --  5.00*  < > = values in this interval not displayed.  Estimated Creatinine Clearance: 18.8 mL/min (by C-G formula based on Cr of 5).  Assessment: 49 yo M admitted 09/24/2014 with progressive weakness and bilateral leg swelling with weeping edema. Pharmacy consulted to dose heparin for afib.  Coag: Afib, on heparin while evaluating plans for amputation.  Heparin level now at goal.  Continue current rate follow up after OR  Plan: Continue current heparin rate Daily HL and CBC  Thank you Okey Regal, PharmD [phone]   10/06/2014 10:32 AM

## 2014-10-06 NOTE — Progress Notes (Signed)
Admit: 09/24/2014 LOS: 12  [name] new ESRD, PAD with BKA RLE 10/06/14  Subjective:  No new events For R BKA today CLIP ongoing, likely to a [place] unit   06/12 0701 - 06/13 0700 In: 1469.6 [P.O.:720; I.V.:549.6; IV Piggyback:200] Out: 105 [Urine:105]  Filed Weights   10/04/14 1320 10/05/14 0404 10/06/14 0410  Weight: 76.3 kg (168 lb 3.4 oz) 80.7 kg (177 lb 14.6 oz) 80.4 kg (177 lb 4 oz)    Scheduled Meds: . amiodarone  400 mg Oral BID  . [START ON 10/07/2014] darbepoetin (ARANESP) injection - DIALYSIS  100 mcg Intravenous Q Tue-HD  . feeding supplement (NEPRO CARB STEADY)  237 mL Oral BID BM  . feeding supplement (PRO-STAT SUGAR FREE 64)  30 mL Oral BID  . ferumoxytol  510 mg Intravenous Q Tue-HD  . folic acid  1 mg Oral Daily  . insulin aspart  0-9 Units Subcutaneous 6 times per day  . metoprolol tartrate  25 mg Oral BID  . multivitamin  1 tablet Oral QHS  . pantoprazole  40 mg Oral Q1200  . piperacillin-tazobactam (ZOSYN)  IV  2.25 g Intravenous 3 times per day  . polyethylene glycol  17 g Oral BID  . senna-docusate  1 tablet Oral BID  . sodium chloride  3 mL Intravenous Q12H  . thiamine  100 mg Oral Daily   Continuous Infusions: . heparin 1,800 Units/hr (10/06/14 0507)   PRN Meds:.acetaminophen, morphine injection, nitroGLYCERIN, ondansetron (ZOFRAN) IV  Current Labs: reviewed    Physical Exam:  Blood pressure 141/72, pulse 66, temperature 98 F (36.7 C), temperature source Oral, resp. rate 16, height 5\' 11"  (1.803 m), weight 80.4 kg (177 lb 4 oz), SpO2 100 %. NAD Multiple tattoos RRR No Edema EOMI Poor dentition  A/P 1. New ESRD 1. Started 6/5 2. Next 6/14 3. CLIP in process; will need to coordinate with ?SNF 2. HTN/Vol 1. Weights labile with inpt stay and BKA 2. Goal 3L next HD 3. BP stable only on MTP 3. Anemia 1. Aranesp 100 qTues 2. Feraheme to be given Tues 4. PAD and ischemic RLE, 1. For BKA with VVS 10/06/14 5. CKD-BMD 1. PTH = 97 on 09/29/14 2. Phos  at goal 3. Follow 6. AMS, improved 7. Episode of VT req cardioversion 6/6, on amio 8. AFib 9. Systolic HF: cont to lower EDW 10. CAD s/p CABG  Sabra Heck MD 10/06/2014, 12:24 PM   Recent Labs Lab 09/30/14 1303  10/02/14 0238  10/04/14 0335 10/05/14 0248 10/06/14 0520  NA 140  < > 137  < > 136 134* 134*  K 3.6  < > 3.7  < > 4.3 4.0 4.1  CL 99*  < > 99*  < > 96* 96* 98*  CO2 22  < > 20*  < > 24 23 21*  GLUCOSE 112*  < > 161*  < > 152* 75 167*  BUN 34*  < > 51*  < > 43* 38* 52*  CREATININE 3.12*  < > 4.63*  < > 4.82* 4.09* 5.00*  CALCIUM 8.3*  < > 8.2*  < > 8.4* 8.2* 8.2*  PHOS 3.8  --  5.7*  --   --   --  5.1*  < > = values in this interval not displayed.  Recent Labs Lab 10/04/14 0335 10/05/14 0248 10/06/14 0520  WBC 8.6 10.1 7.0  HGB 10.2* 10.3* 9.9*  HCT 32.3* 32.2* 29.9*  MCV 87.8 88.2 86.4  PLT 129* 143* 123*

## 2014-10-06 NOTE — Transfer of Care (Signed)
Immediate Anesthesia Transfer of Care Note  Patient: [name]  Procedure(s) Performed: Procedure(s): AMPUTATION ABOVE KNEE Right (Right)  Patient Location: PACU  Anesthesia Type:General  Level of Consciousness: awake  Airway & Oxygen Therapy: Patient Spontanous Breathing and Patient connected to nasal cannula oxygen  Post-op Assessment: Report given to RN, Post -op Vital signs reviewed and stable and Patient moving all extremities  Post vital signs: Reviewed and stable  Last Vitals:  Filed Vitals:   10/06/14 1204  BP:   Pulse:   Temp: 36.7 C  Resp:     Complications: No apparent anesthesia complications

## 2014-10-07 ENCOUNTER — Encounter (HOSPITAL_COMMUNITY): Payer: Self-pay | Admitting: Vascular Surgery

## 2014-10-07 DIAGNOSIS — Z89611 Acquired absence of right leg above knee: Secondary | ICD-10-CM

## 2014-10-07 DIAGNOSIS — I70261 Atherosclerosis of native arteries of extremities with gangrene, right leg: Secondary | ICD-10-CM | POA: Insufficient documentation

## 2014-10-07 LAB — BASIC METABOLIC PANEL
ANION GAP: 19 — AB (ref 5–15)
BUN: 60 mg/dL — ABNORMAL HIGH (ref 6–20)
CO2: 19 mmol/L — ABNORMAL LOW (ref 22–32)
Calcium: 8.2 mg/dL — ABNORMAL LOW (ref 8.9–10.3)
Chloride: 94 mmol/L — ABNORMAL LOW (ref 101–111)
Creatinine, Ser: 5.99 mg/dL — ABNORMAL HIGH (ref 0.61–1.24)
GFR calc non Af Amer: 10 mL/min — ABNORMAL LOW (ref >60)
GFR, EST AFRICAN AMERICAN: 11 mL/min — AB (ref >60)
Glucose, Bld: 125 mg/dL — ABNORMAL HIGH (ref 65–99)
POTASSIUM: 4.3 mmol/L (ref 3.5–5.1)
Sodium: 132 mmol/L — ABNORMAL LOW (ref 135–145)

## 2014-10-07 LAB — GLUCOSE, CAPILLARY
GLUCOSE-CAPILLARY: 127 mg/dL — AB (ref 65–99)
GLUCOSE-CAPILLARY: 131 mg/dL — AB (ref 65–99)
GLUCOSE-CAPILLARY: 139 mg/dL — AB (ref 65–99)
Glucose-Capillary: 132 mg/dL — ABNORMAL HIGH (ref 65–99)
Glucose-Capillary: 150 mg/dL — ABNORMAL HIGH (ref 65–99)
Glucose-Capillary: 104 mg/dL — ABNORMAL HIGH (ref 65–99)
Glucose-Capillary: 128 mg/dL — ABNORMAL HIGH (ref 65–99)
Glucose-Capillary: 143 mg/dL — ABNORMAL HIGH (ref 65–99)
Glucose-Capillary: 124 mg/dL — ABNORMAL HIGH (ref 65–99)

## 2014-10-07 LAB — HEPARIN LEVEL (UNFRACTIONATED): Heparin Unfractionated: 0.34 IU/mL (ref 0.30–0.70)

## 2014-10-07 MED ORDER — MORPHINE SULFATE 2 MG/ML IJ SOLN
2.0000 mg | INTRAMUSCULAR | Status: DC | PRN
  Administered 2014-10-07: 4 mg via INTRAVENOUS
  Administered 2014-10-07 (×4): 2 mg via INTRAVENOUS
  Administered 2014-10-08 (×2): 4 mg via INTRAVENOUS
  Administered 2014-10-08: 2 mg via INTRAVENOUS
  Administered 2014-10-08: 4 mg via INTRAVENOUS
  Administered 2014-10-08: 2 mg via INTRAVENOUS
  Administered 2014-10-09 – 2014-10-10 (×5): 4 mg via INTRAVENOUS
  Filled 2014-10-07 (×2): qty 1
  Filled 2014-10-07 (×4): qty 2
  Filled 2014-10-07 (×2): qty 1
  Filled 2014-10-07 (×5): qty 2
  Filled 2014-10-07: qty 1
  Filled 2014-10-07: qty 2

## 2014-10-07 MED ORDER — VANCOMYCIN HCL IN DEXTROSE 750-5 MG/150ML-% IV SOLN
750.0000 mg | Freq: Once | INTRAVENOUS | Status: AC
  Administered 2014-10-08: 750 mg via INTRAVENOUS
  Filled 2014-10-07: qty 150

## 2014-10-07 NOTE — Progress Notes (Signed)
ANTICOAGULATION / Antibiotic CONSULT NOTE - Follow Up Consult  Pharmacy Consult for Heparin / Vancomycin and Zosyn Indication: atrial fibrillation / s/p AKA  No Known Allergies  Patient Measurements: Height: 5\' 11"  (180.3 cm) Weight: 168 lb 10.4 oz (76.5 kg) IBW/kg (Calculated) : 75.3  Vital Signs: Temp: 98.3 F (36.8 C) (06/14 0800) Temp Source: Oral (06/14 0800) BP: 142/71 mmHg (06/14 0902) Pulse Rate: 67 (06/14 1148)  Labs:  Recent Labs  10/05/14 0248  10/05/14 2127 10/06/14 0520 10/07/14 0540  HGB 10.3*  --   --  9.9*  --   HCT 32.2*  --   --  29.9*  --   PLT 143*  --   --  123*  --   LABPROT  --   --   --  17.0*  --   INR  --   --   --  1.37  --   HEPARINUNFRC 0.12*  < > 0.48 0.43 0.34  CREATININE 4.09*  --   --  5.00* 5.99*  < > = values in this interval not displayed.  Estimated Creatinine Clearance: 15.7 mL/min (by C-G formula based on Cr of 5.99).  Assessment: 49 yo M admitted 09/24/2014 with progressive weakness and bilateral leg swelling with weeping edema. Pharmacy consulted to dose heparin for afib. Now s/p AKA and continues on antibiotics (day # 14) Hemodialysis planned for today  Plan: Continue heparin at 1800 units / hr - start po anticoagulation? Daily HL and CBC Vancomycin 750 mg iv x 1 dose after HD today (stop antibiotics?)  Thank you Okey Regal, PharmD [phone]   10/07/2014 1:13 PM

## 2014-10-07 NOTE — Progress Notes (Signed)
Speech Language Pathology Treatment: Dysphagia  Patient Details Name: [name] MRN: [id number] DOB: 1965-05-12 Today's Date: 10/07/2014 Time: 1010-1020 SLP Time Calculation (min) (ACUTE ONLY): 10 min  Assessment / Plan / Recommendation Clinical Impression  Pt tolerating regular solid and thin liquids. Dr. Joseph Green confirms pt is ok to advance diet from clear liquids. Pt demonstrated understanding of basic aspiration precautions. No SLP f/u needed, will sign off.    HPI     Pertinent Vitals    SLP Plan       Recommendations Diet recommendations: Regular;Thin liquid Liquids provided via: Cup;Straw Medication Administration: Whole meds with liquid Supervision: Patient able to self feed Postural Changes and/or Swallow Maneuvers: Seated upright 90 degrees              Follow up Recommendations: None    GO     [name], [name] 10/07/2014, 10:32 AM

## 2014-10-07 NOTE — Progress Notes (Signed)
Unable to doppler pt or DP left foot.  Foot also cool to touch.  [name], PAC, notified.  Warm blanket to be placed on foot to keep warm.  Continue to watch.

## 2014-10-07 NOTE — Progress Notes (Signed)
Physical Therapy Treatment Patient Details Name: [name] MRN: [id number] DOB: [birth year] Today's Date: 10/07/2014    History of Present Illness Pt admitted with RLE cellulitis. PMHx of HTN, HLD, DM, GERD, CHF, CKD, polysubstance abuse. 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    PT Comments    Pt progressing well post R AKA.  Emphasis on initiating amputee exercise, education on positioning, bed mobility sitting balance and transfers.  Follow Up Recommendations  Supervision/Assistance - 24 hour;CIR     Equipment Recommendations  None recommended by PT    Recommendations for Other Services       Precautions / Restrictions Precautions Precautions: Fall Restrictions Weight Bearing Restrictions: No    Mobility  Bed Mobility Overal bed mobility: Needs Assistance Bed Mobility: Supine to Sit Rolling: Min assist (left and right)   Supine to sit: Min assist     General bed mobility comments: truncal assist  and pt using bil UE's  Transfers Overall transfer level: Needs assistance Equipment used: Rolling walker (2 wheeled) Transfers: Sit to/from [employer] Sit to Stand: Min assist;+2 physical assistance Stand pivot transfers: Mod assist;+2 safety/equipment       General transfer comment: stability assist to build confidence.  asssit to come forward and lift to stand.  Effortful pivotal steps.  Ambulation/Gait                 Stairs            Wheelchair Mobility    Modified Rankin (Stroke Patients Only)       Balance Overall balance assessment: Needs assistance Sitting-balance support: No upper extremity supported;Single extremity supported Sitting balance-Leahy Scale: Fair Sitting balance - Comments: tendency to list posteriorly, but able to balance at EOB and scoot to EOB with min assist   Standing balance support: Bilateral upper extremity supported Standing balance-Leahy Scale: Poor Standing balance comment: stood at EOB in RW and worked on  pre gait activity incl pivotal steps, attempting to "swing forward and back"                    Cognition Arousal/Alertness: Lethargic;Awake/alert Behavior During Therapy: Flat affect Overall Cognitive Status: Within Functional Limits for tasks assessed                      Exercises Amputee Exercises Hip Extension: AAROM;AROM;Right;10 reps;Supine Hip ABduction/ADduction: AROM;10 reps;Right;Supine Hip Flexion/Marching: AROM;10 reps;Supine;Right    General Comments General comments (skin integrity, edema, etc.): initiated AKA exercise.      Pertinent Vitals/Pain Pain Assessment: 0-10 Pain Score: 8  (to 10 by end) Pain Location: R residual limb Pain Descriptors / Indicators: Aching;Sharp Pain Intervention(s): Monitored during session;Patient requesting pain meds-RN notified;Repositioned    Home Living Family/patient expects to be discharged to:: Private residence Living Arrangements: Parent;Other relatives Available Help at Discharge: Family;Available 24 hours/day Type of Home: House Home Access: Stairs to enter Entrance Stairs-Rails: Right;Left Home Layout: One level Home Equipment: Environmental consultant - 2 wheels;Wheelchair - manual;Cane - single point Additional Comments: mom reports pt using a walker prior to this admission as he became weaker, moved in with family to start peritoneal dialysis, but was not started because of dogs in the house per mom    Prior Function Level of Independence: Independent          PT Goals (current goals can now be found in the care plan section) Acute Rehab PT Goals Patient Stated Goal: to get this pain better PT Goal Formulation: With patient Time  For Goal Achievement: 10/09/14 Potential to Achieve Goals: Fair    Frequency  Min 3X/week    PT Plan      Co-evaluation             End of Session   Activity Tolerance: Patient tolerated treatment well Patient left: in chair;with call bell/phone within reach;with chair alarm  set     Time: 1531-1557 PT Time Calculation (min) (ACUTE ONLY): 26 min  Charges:  $Therapeutic Activity: 8-22 mins                    G Codes:      Costas Sena, Eliseo Gum 10/07/2014, 4:52 PM 10/07/2014  Caberfae Bing, PT [phone]  (pager)

## 2014-10-07 NOTE — Progress Notes (Signed)
[facility] TEAM 1 - Stepdown/ICU TEAM Progress Note  [name] TRR:[id number] DOB: 1965-09-12 DOA: 09/24/2014 PCP: [name], NP  Admit HPI / Brief Narrative: 49 y.o. WM PMHx hypertension, hyperlipidemia, diabetes mellitus, GERD, CAD(s/P of CABG 2010), combined systolic and diastolic congestive heart failure (EF 20-25% with grade 3 diastolic dysfunction), chronic kidney disease-stage IV, severe PAD, substance abuse, who presents with right leg swelling and pain.  Patient is a poor historian. It seems that he has been having right leg pain and swelling in the past several days. His leg becomes red from foot to upper thigh medially. He does not have fever or chills. Patient was unable to ambulate over the past 2 days secondary to pain on the right leg. He reports a generalized weakness. States he has decreased urinary output and constipation, states he has not been eating and drinking regularly. Patient states that he is living with his mother.  In ED, patient was found to have WBC 13.8, tachycardia, normal temperature, stable blood pressure, AoCKD-IV, negative chest x-ray for acute abnormalities, ALP 168, AST 27, ALT 15, total bilirubin 4.4, lactate 1.85, INR 1.39, urinalysis with small amount of leukocytes. Patient is admitted to inpatient for further evaluation and treatment. Renal was consulted by ED  HPI/Subjective: 6/14 A/O 4, states having pain in his right AKA stump rated at 11/10, states he is embarrassed that he's had a bowel movement on himself. States negative left lower extremity pain    Assessment/Plan: Metabolic Encephalopathy -Resolved  Bilateral lower leg Cellulitis  and sepsis due to unspecified organism:  -continue Zosyn S/P right AKA  - Blood cultures x 2 NGTD  Atherosclerosis of native artery of Right Lower leg with Gangrene? -Resolved, S/P right AKA on 6/13 -Vascular surgery was informed by nursing staff that unable to obtain left lower extremity DP/PT pulse,  vascular surgery to evaluate today.  Jaundice (multiple tattoos which appear to be homemade?) -Acute Hepatitis panel negative - RPR negative -anti-nuclear antibody negative -HIV negative  PAD: - Patient had arterial Doppler 2013, which showed Right and left anterior tibial arteries and Left SFA-occluded; Right CIA-<50% diameter reduction; Right Deep Profunda-70-99% diameter reduction; Right SFA->60% diameter reduction; Right Distal Popliteal/Tibial Artery- >60% diameter reduction; Left CFA and Profunda- >50% diameter reduction. -See right lower leg gangrene.  CAD in native artery and s/p of CABG 2010:  - EKG showed old left bundle blockage. -Metoprolol 25 mg BID -Metoprol IV 2mg  PRN  Chronic Systolic CHF/Cardiomyopathy  -2-D echo on 02/21/14 showed EF 20-25% with grade 3 diastolic dysfunction.  - seen by Dr. Royann Shivers on 08/07/14. Per Dr. Royann Shivers, the decisions regarding cardiac device therapy is depending heavily on his decision regarding dialysis. Subcutaneous ICD was considered if he starts dialysis. -6/3 Echocardiogram; shows cardiomyopathy with worsening EF see results below  -Continue metoprolol 25 mg BID  HTN: -See CAD  Hyperlipidemia:  -lipid panel abnormal  DM type-II controlled:  -Last A1c 10.5 on 03/19/11.,  -6/3 Hemoglobin A1c= 6.4 -Sensitive SSI  Acute on chronic renal failure( Previous creatinine was a 5.85 on 03/11/14) -Be very judicious in the use of narcotics and benzodiazepine as patient will not clear them well . -Current Cr= 5.99 -Avoid ACEI and NSAIDs  Hypokalemia -Potassium goal>4 -Potassium normal    Code Status: FULL Family Communication: no family present at time of exam Disposition Plan: Resolution sepsis    Consultants: Dr.James Kerry Kass (nephrology) Dr. Zetta Bills (nephrology) Dr. Fabienne Bruns (vascular surgery)   Procedure/Significant Events: 6/3 echocardiogram;- Left ventricle: The  cavity size was mildly dilated. mild LVH.  -LVEF=  20%. Diffuse hypokinesis. - Left atrium:  moderately dilated.- Right ventricle: mildly to moderately dilated.  - Right atrium: The atrium was mildly dilated. 6/13 right AKA; performed by Dr. Fabienne Bruns    Culture 6/1 blood right arm left forearm NGTD 6/1 urine insignificant growth 6/2 MRSA by PCR negative 6/1 HIV negative 6/3 RPR negative 6/3 hepatitis panel negative   Antibiotics: Vancomycin 6 /1>> stopped 6/11 Zosyn 6/2>>   DVT prophylaxis: Subcutaneous heparin   Devices    LINES / TUBES:      Continuous Infusions: . sodium chloride 20 mL/hr at 10/06/14 1355  . heparin 1,800 Units/hr (10/06/14 2346)    Objective: VITAL SIGNS: Temp: 97.7 F (36.5 C) (06/14 0326) Temp Source: Oral (06/14 0326) BP: 145/74 mmHg (06/14 0326) Pulse Rate: 67 (06/14 0326) SPO2; FIO2:   Intake/Output Summary (Last 24 hours) at 10/07/14 0751 Last data filed at 10/06/14 1700  Gross per 24 hour  Intake    450 ml  Output      0 ml  Net    450 ml     Exam: General: A/O 4 , cachectic, No acute respiratory distress Eyes: Negative retinal hemorrhage, he was equal round reactive to light and accommodation. ENT: Negative Runny nose, Neck:  Negative scars, masses, torticollis, lymphadenopathy, JVD Lungs: Clear to auscultation bilaterally without wheezes or crackles Cardiovascular: Regular Rhythm and rate, without murmur gallop or rub normal S1 and S2 Abdomen:negative abdominal pain, negative dysphagia, nondistended, soft, bowel sounds positive, no rebound, no ascites, no appreciable mass Extremities: LLE  cold to touch, negative palpable/dopplerable DP/ PT pulse; right AKA stump covered with Ace wrap mild bleeding on the medial aspect present did not remove bandage. Psychiatric:  Negative depression, negative mania,  Neurologic:  Moves all extremities to command    Data Reviewed: Basic Metabolic Panel:  Recent Labs Lab 09/30/14 1303  10/02/14 0238 10/03/14 0235  10/04/14 0335 10/05/14 0248 10/06/14 0520 10/07/14 0540  NA 140  < > 137 136 136 134* 134* 132*  K 3.6  < > 3.7 3.7 4.3 4.0 4.1 4.3  CL 99*  < > 99* 96* 96* 96* 98* 94*  CO2 22  < > 20* 21* 19*  GLUCOSE 112*  < > 161* 171* 152* 75 167* 125*  BUN 34*  < > 51* 31* 43* 38* 52* 60*  CREATININE 3.12*  < > 4.63* 3.68* 4.82* 4.09* 5.00* 5.99*  CALCIUM 8.3*  < > 8.2* 8.2* 8.4* 8.2* 8.2* 8.2*  MG  --   --   --  2.0  --   --   --   --   PHOS 3.8  --  5.7*  --   --   --  5.1*  --   < > = values in this interval not displayed. Liver Function Tests:  Recent Labs Lab 10/02/14 0238 10/03/14 0235 10/04/14 0335 10/05/14 0248 10/06/14 0520  AST  --  22 20 7*  --   ALT  --  12* 13* 11*  --   ALKPHOS  --  90 92 107  --   BILITOT  --  2.8* 2.5* 2.3*  --   PROT  --  5.9* 6.2* 6.2*  --   ALBUMIN 2.2* 2.2* 2.2* 2.3* 2.1*    Recent Labs Lab 10/04/14 0335  LIPASE 119*   No results for input(s): AMMONIA in the last 168 hours. CBC:  Recent Labs Lab  10/02/14 0238 10/03/14 0235 10/04/14 0335 10/05/14 0248 10/06/14 0520  WBC 10.8* 9.8 8.6 10.1 7.0  HGB 11.1* 10.5* 10.2* 10.3* 9.9*  HCT 33.8* 32.4* 32.3* 32.2* 29.9*  MCV 86.9 86.9 87.8 88.2 86.4  PLT 171 154 129* 143* 123*   Cardiac Enzymes:  Recent Labs Lab 09/30/14 1303  TROPONINI 0.11*   BNP (last 3 results)  Recent Labs  09/25/14 0410  BNP 2227.0*    ProBNP (last 3 results) No results for input(s): PROBNP in the last 8760 hours.  CBG:  Recent Labs Lab 10/06/14 1347 10/06/14 1726 10/06/14 2002 10/06/14 2330 10/07/14 0325  GLUCAP 134* 150* 171* 131* 104*    No results found for this or any previous visit (from the past 240 hour(s)).   Studies:  Recent x-ray studies have been reviewed in detail by the Attending Physician  Scheduled Meds:  Scheduled Meds: . amiodarone  400 mg Oral BID  . darbepoetin (ARANESP) injection - DIALYSIS  100 mcg Intravenous Q Tue-HD  . feeding supplement (NEPRO CARB  STEADY)  237 mL Oral BID BM  . feeding supplement (PRO-STAT SUGAR FREE 64)  30 mL Oral BID  . ferumoxytol  510 mg Intravenous Q Tue-HD  . folic acid  1 mg Oral Daily  . insulin aspart  0-9 Units Subcutaneous 6 times per day  . metoprolol tartrate  25 mg Oral BID  . multivitamin  1 tablet Oral QHS  . pantoprazole  40 mg Oral Q1200  . piperacillin-tazobactam (ZOSYN)  IV  2.25 g Intravenous 3 times per day  . polyethylene glycol  17 g Oral BID  . senna-docusate  1 tablet Oral BID  . sodium chloride  3 mL Intravenous Q12H  . thiamine  100 mg Oral Daily    Time spent on care of this patient: 40 mins   WOODS, Roselind Messier , MD  [facility] Office  [phone] Pager - [phone]  On-Call/Text Page:      [email or web address]      password [email or web address]  If 7PM-7AM, please contact night-coverage [email or web address] Password [email or web address] 10/07/2014, 7:51 AM   LOS: 13 days   Care during the described time interval was provided by me .  I have reviewed this patient's available data, including medical history, events of note, physical examination, and all test results as part of my evaluation. I have personally reviewed and interpreted all radiology studies.   Carolyne Littles, MD [phone] Pager

## 2014-10-07 NOTE — Progress Notes (Signed)
UR COMPLETED  

## 2014-10-07 NOTE — Progress Notes (Signed)
Admit: 09/24/2014 LOS: 13  68M new ESRD, PAD with AKA RLE 10/06/14  Subjective:  R AKA 6/13 In pain this AM  06/13 0701 - 06/14 0700 In: 478 [I.V.:478] Out: -   Filed Weights   10/05/14 0404 10/06/14 0410 10/07/14 0326  Weight: 80.7 kg (177 lb 14.6 oz) 80.4 kg (177 lb 4 oz) 76.5 kg (168 lb 10.4 oz)    Scheduled Meds: . amiodarone  400 mg Oral BID  . darbepoetin (ARANESP) injection - DIALYSIS  100 mcg Intravenous Q Tue-HD  . feeding supplement (NEPRO CARB STEADY)  237 mL Oral BID BM  . feeding supplement (PRO-STAT SUGAR FREE 64)  30 mL Oral BID  . ferumoxytol  510 mg Intravenous Q Tue-HD  . folic acid  1 mg Oral Daily  . insulin aspart  0-9 Units Subcutaneous 6 times per day  . metoprolol tartrate  25 mg Oral BID  . multivitamin  1 tablet Oral QHS  . pantoprazole  40 mg Oral Q1200  . piperacillin-tazobactam (ZOSYN)  IV  2.25 g Intravenous 3 times per day  . polyethylene glycol  17 g Oral BID  . senna-docusate  1 tablet Oral BID  . sodium chloride  3 mL Intravenous Q12H  . thiamine  100 mg Oral Daily   Continuous Infusions: . sodium chloride 10 mL/hr at 10/07/14 0700  . heparin 1,800 Units/hr (10/07/14 1100)   PRN Meds:.acetaminophen, heparin, morphine injection, nitroGLYCERIN, ondansetron (ZOFRAN) IV, oxyCODONE  Current Labs: reviewed    Physical Exam:  Blood pressure 142/71, pulse 67, temperature 98.3 F (36.8 C), temperature source Oral, resp. rate 18, height 5\' 11"  (1.803 m), weight 76.5 kg (168 lb 10.4 oz), SpO2 100 %. NAD Multiple tattoos RRR No Edema EOMI Poor dentition  A/P 1. New ESRD 1. Started 6/5 2. Next 6/14, today 3. CLIP in process; will need to coordinate with ?SNF 2. HTN/Vol 1. Weights labile with inpt stay and BKA 2. Goal 3L next HD 3. BP stable only on MTP 3. Anemia 1. Aranesp 100 qTues 2. Feraheme to be given Tues 4. PAD and ischemic RLE, 1. 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 CKD-BMD 1. PTH = 97 on 09/29/14 2. Phos at goal 3. Follow 6. AMS,  improved 7. Episode of VT req cardioversion 6/6, on amio 8. AFib 9. Systolic HF: cont to lower EDW 10. CAD s/p CABG  Sabra Heck MD 10/07/2014, 12:34 PM   Recent Labs Lab 09/30/14 1303  10/02/14 0238  10/05/14 0248 10/06/14 0520 10/07/14 0540  NA 140  < > 137  < > 134* 134* 132*  K 3.6  < > 3.7  < > 4.0 4.1 4.3  CL 99*  < > 99*  < > 96* 98* 94*  CO2 22  < > 20*  < > 23 21* 19*  GLUCOSE 112*  < > 161*  < > 75 167* 125*  BUN 34*  < > 51*  < > 38* 52* 60*  CREATININE 3.12*  < > 4.63*  < > 4.09* 5.00* 5.99*  CALCIUM 8.3*  < > 8.2*  < > 8.2* 8.2* 8.2*  PHOS 3.8  --  5.7*  --   --  5.1*  --   < > = values in this interval not displayed.  Recent Labs Lab 10/04/14 0335 10/05/14 0248 10/06/14 0520  WBC 8.6 10.1 7.0  HGB 10.2* 10.3* 9.9*  HCT 32.3* 32.2* 29.9*  MCV 87.8 88.2 86.4  PLT 129* 143* 123*

## 2014-10-07 NOTE — Progress Notes (Addendum)
[facility]  Subjective  - Doing well.   Objective 145/74 67 97.7 F (36.5 C) (Oral) 21 100%  Intake/Output Summary (Last 24 hours) at 10/07/14 0830 Last data filed at 10/06/14 1700  Gross per 24 hour  Intake    450 ml  Output      0 ml  Net    450 ml    Right AKA dressing intact Left PT no doppler signal, active range of motion intact, sensation intact Heart RRR Lungs non labored breathing  Assessment/Planning: POD # 1 Right AKA  Left No doppler signals will place warm blankets on his foot and lower leg, active range of motion intact palpable left femoral, non palpable popliteal , but leg is warm above the ankle.  Will discuss this with Dr. Darrick Penna.  Previous ABI 09/30/2014 shows dampened monophasic PT on the left  [name], [name] 10/07/2014 8:30 AM --   Pt has left leg peroneal doppler signal.  Clinically he is asymptomatic on the left foot.  In his current overall condition he is not a candidate for any revascularization procedures.  Would follow clinically,  If he develops unrelenting pain or infection may need amputation otherwise observe.  AKA on right dressing intact change tomorrow.  Fabienne Bruns, MD [facility] Office: [phone] Pager: [phone]   Laboratory Lab Results:  Recent Labs  10/05/14 0248 10/06/14 0520  WBC 10.1 7.0  HGB 10.3* 9.9*  HCT 32.2* 29.9*  PLT 143* 123*   BMET  Recent Labs  10/06/14 0520 10/07/14 0540  NA 134* 132*  K 4.1 4.3  CL 98* 94*  CO2 21* 19*  GLUCOSE 167* 125*  BUN 52* 60*  CREATININE 5.00* 5.99*  CALCIUM 8.2* 8.2*    COAG Lab Results  Component Value Date   INR 1.37 10/06/2014   INR 1.34 10/01/2014   INR 1.45 09/27/2014   No results found for: PTT

## 2014-10-07 NOTE — Consult Note (Signed)
Physical Medicine and Rehabilitation Consult   Reason for Consult: R-AKA- Ischemic RLE with gangrenous changes.  Referring Physician: Dr. Sharon Seller   HPI: [name] is a 49 y.o. male with history of CAD s/p CABG, DM type 2, CKD-stage IV/V severe PAD, ICM, who was admitted on 09/24/14 with lethargy, confusion, poor po intake and worsening of RLE pain with inability to walk.  He was noted to uremic and have BLE cellulitis with ulcere RLE>LLE.  Renal ultrasound without signs of obstruction and was started on IVF for hydration per input from nephrology.  He was started on antibiotics for sepsis but continued to have worsening of renal status therefore IJ cath placed by Dr. Hart Rochester and HD initiated on 06/08.  He developed AF with RVR/VT requiring cardioversion with resolution. Dr. Ladona Ridgel consulted for input and recommended transitioning to po amiodarone as not a candidate for EP procedures due to ongoing medical issues as well as encephalopathy.  As mentation improved, Dr. Darrick Penna consulted for input on ischemic RLE but patient declined surgery. Patient was agreeable to surgery as he continued to have pain RLE with dry gangrenous changes. On 10/06/14, patient underwent R-AKA   Review of Systems  Constitutional: Negative for fever and chills.  HENT: Negative for hearing loss.   Eyes: Negative for double vision.  Respiratory: Positive for cough.   Cardiovascular: Negative for chest pain.  Gastrointestinal: Negative for heartburn and nausea.  Genitourinary: Positive for dysuria.  Musculoskeletal: Positive for myalgias and joint pain.  Skin: Negative for itching.  Neurological: Positive for focal weakness. Negative for headaches.  Psychiatric/Behavioral: The patient is not nervous/anxious.       Past Medical History  Diagnosis Date  . Coronary artery disease 07/05/2011    2D ECHO - EF ~40%, moderate concentric LV hypertrophy, LA moderately dilated, mild-moderate septal and inferior wall  hypokinesis  . Hypertension   . CHF (congestive heart failure)   . Diabetes mellitus     Insulin dependent  . Cardiomyopathy   . Chronic kidney disease (CKD), stage IV (severe)   . Claudication 05/09/2011    Right and left anterior tibial arteries and Left SFA-occluded; Right CIA-<50% diameter reduction; Right Deep Profunda-70-99% diameter reduction; Right SFA->60% diameter reduction; Right Distal Popliteal/Tibial Artery- >60% diameter reduction; Left CFA and Profunda- >50% diameter reduction  . 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 GERD (gastroesophageal reflux disease)     Past Surgical History  Procedure Laterality Date  . Cardiac catheterization  05/07/2008    CABG  . Coronary artery bypass graft  06/2008    x3  . Insertion of dialysis catheter Right 10/01/2014    Procedure: INSERTION OF RIGHT INTERNAL JUGULAR DIALYSIS CATHETER;  Surgeon: Larina Earthly, MD;  Location: [facility] OR;  Service: Vascular;  Laterality: Right;    History reviewed. No pertinent family history.    Social History:  reports that he quit smoking about 13 months ago. His smoking use included Cigarettes. He has a 12.5 pack-year smoking history. He does not have any smokeless tobacco history on file. He reports that he drinks alcohol. He reports that he uses illicit drugs (Marijuana).    Allergies: No Known Allergies    Medications Prior to Admission  Medication Sig Dispense Refill  . calcium carbonate (TUMS EX) 750 MG chewable tablet Chew  1 tablet by mouth as needed for heartburn.    . Ferrous Sulfate (IRON) 325 (65 FE) MG TABS Take 325 mg by mouth daily.    . furosemide (LASIX) 80 MG tablet Take 1 tablet (80 mg total) by mouth daily. 30 tablet 5  . hydrALAZINE (APRESOLINE) 50 MG tablet Take 1 tablet (50 mg total) by mouth every 8  (eight) hours. 90 tablet 6  . isosorbide mononitrate (IMDUR) 60 MG 24 hr tablet Take 1 tablet (60 mg total) by mouth daily. 60 tablet 6  . LEVEMIR 100 UNIT/ML injection Inject 5 Units into the skin daily before breakfast.    . metoprolol (LOPRESSOR) 50 MG tablet Take 1 tablet (50 mg total) by mouth 2 (two) times daily. 60 tablet 9  . Oxycodone HCl 10 MG TABS Take 1 tablet by mouth every 6 (six) hours as needed. For pain    . pantoprazole (PROTONIX) 40 MG tablet Take 40 mg by mouth daily.     . sodium bicarbonate 650 MG tablet Take 650 mg by mouth 2 (two) times daily.    Marland Kitchen acyclovir (ZOVIRAX) 800 MG tablet Take 1 tablet (800 mg total) by mouth 5 (five) times daily. (Patient not taking: Reported on 09/24/2014) 56 tablet 0  . nitroGLYCERIN (NITROSTAT) 0.4 MG SL tablet Place 1 tablet (0.4 mg total) under the tongue once. (Patient taking differently: Place 0.4 mg under the tongue every 5 (five) minutes as needed for chest pain. ) 25 tablet 5    Home: Home Living Family/patient expects to be discharged to:: Private residence Living Arrangements: Other relatives Available Help at Discharge: Family, Available PRN/intermittently Type of Home: House Home Access: Stairs to enter Secretary/administrator of Steps: 6 Home Layout: One level Home Equipment: Environmental consultant - 2 wheels, Wheelchair - manual, [employer] - single point Additional Comments: mom reports pt using a walker prior to this admission as he became weaker, moved in with family to start peritoneal dialysis, but was not started because of dogs in the house per mom  Functional History: Prior Function Level of Independence: Independent Functional Status:  Mobility: Bed Mobility Overal bed mobility: Modified Independent General bed mobility comments: pt up in chair Transfers Overall transfer level: Needs assistance Equipment used: Rolling walker (2 wheeled) Transfers: Sit to/from Stand Sit to Stand: Min assist General transfer comment: assist to rise  from chair Ambulation/Gait Ambulation/Gait assistance: Min assist Ambulation Distance (Feet): 3 Feet Assistive device: Rolling walker (2 wheeled) General Gait Details: cues for sequence to unweight RLE, limited by pain with minimal steps and pivoting to chair Gait Pattern/deviations: Step-to pattern, Decreased stride length, Decreased stance time - right, Trunk flexed Gait velocity interpretation: Below normal speed for age/gender    ADL: ADL Overall ADL's : Needs assistance/impaired Grooming: Wash/dry hands, Wash/dry face, Set up, Sitting Upper Body Bathing: Moderate assistance, Sitting Lower Body Bathing: Maximal assistance, Sit to/from stand Upper Body Dressing : Minimal assistance, Sitting Lower Body Dressing: Maximal assistance, Sit to/from stand Toilet Transfer Details (indicate cue type and reason): stood to use urinal, reliant on B UEs for standing while OT held urinal Toileting- Clothing Manipulation and Hygiene: Maximal assistance  Cognition: Cognition Overall Cognitive Status: Impaired/Different from baseline Orientation Level: Oriented to person, Oriented to place, Oriented to situation, Disoriented to time Cognition Arousal/Alertness: Awake/alert Behavior During Therapy: WFL for tasks assessed/performed Overall Cognitive Status: Impaired/Different from baseline Area of Impairment: Orientation, Memory, Safety/judgement Orientation Level: Disoriented to, Time Memory: Decreased short-term memory Safety/Judgement: Decreased awareness of safety, Decreased awareness of  deficits General Comments: Pt unable to recall when leg pain started, did not know month or day, easily drifts to sleep when static  Blood pressure 145/74, pulse 67, temperature 97.7 F (36.5 C), temperature source Oral, resp. rate 21, height  (1.803 m), weight 76.5 kg (168 lb 10.4 oz), SpO2 100 %. Physical Exam  Constitutional: No distress.  HENT:  Head: Normocephalic.  Eyes: Pupils are equal,  round, and reactive to light.  Neck: No thyromegaly present.  Cardiovascular: Normal rate.   Respiratory: Effort normal. No respiratory distress.  GI: He exhibits no distension. There is no tenderness.  Musculoskeletal:  Right AK wrapped. Tender, edema +  Neurological:  Fairly alert. Oriented to place, month, year. Answers biographical questions. UE's 4/5 prox to distal. LLE: 4/5 prox to distal. Able to lift right leg off bed  Psychiatric:  Flat, cooperative    Results for orders placed or performed during the hospital encounter of 09/24/14 (from the past 24 hour(s))  Glucose, capillary     Status: Abnormal   Collection Time: 10/06/14 11:54 AM  Result Value Ref Range   Glucose-Capillary 135 (H) 65 - 99 mg/dL  Glucose, capillary     Status: Abnormal   Collection Time: 10/06/14  1:47 PM  Result Value Ref Range   Glucose-Capillary 134 (H) 65 - 99 mg/dL  Glucose, capillary     Status: Abnormal   Collection Time: 10/06/14  5:26 PM  Result Value Ref Range   Glucose-Capillary 150 (H) 65 - 99 mg/dL  Glucose, capillary     Status: Abnormal   Collection Time: 10/06/14  8:02 PM  Result Value Ref Range   Glucose-Capillary 171 (H) 65 - 99 mg/dL  Glucose, capillary     Status: Abnormal   Collection Time: 10/06/14 11:30 PM  Result Value Ref Range   Glucose-Capillary 131 (H) 65 - 99 mg/dL  Glucose, capillary     Status: Abnormal   Collection Time: 10/07/14  3:25 AM  Result Value Ref Range   Glucose-Capillary 104 (H) 65 - 99 mg/dL  Heparin level (unfractionated)     Status: None   Collection Time: 10/07/14  5:40 AM  Result Value Ref Range   Heparin Unfractionated 0.34 0.30 - 0.70 IU/mL  Basic metabolic panel     Status: Abnormal   Collection Time: 10/07/14  5:40 AM  Result Value Ref Range   Sodium 132 (L) 135 - 145 mmol/L   Potassium 4.3 3.5 - 5.1 mmol/L   Chloride 94 (L) 101 - 111 mmol/L   CO2 19 (L) 22 - 32 mmol/L   Glucose, Bld 125 (H) 65 - 99 mg/dL   BUN 60 (H) 6 - 20 mg/dL    Creatinine, Ser 7.42 (H) 0.61 - 1.24 mg/dL   Calcium 8.2 (L) 8.9 - 10.3 mg/dL   GFR calc non Af Amer 10 (L) >60 mL/min   GFR calc Af Amer 11 (L) >60 mL/min   Anion gap 19 (H) 5 - 15   US Abdomen Limited Ruq  10/05/2014   CLINICAL DATA:  Jaundice  EXAM: US ABDOMEN LIMITED - RIGHT UPPER QUADRANT  COMPARISON:  None  FINDINGS: Gallbladder:  Well distended with echogenic material consistent with gallbladder sludge. No gallstones are seen. Diffuse ascites is seen contributing to mild wall thickening of 3.7 mm.  Common bile duct:  Diameter: 3.2 mm.  Liver:  No focal lesion identified. Within normal limits in parenchymal echogenicity.  IMPRESSION: Diffuse ascites.  Gallbladder sludge.   Electronically Signed  By: Alcide Clever M.D.   On: 10/05/2014 09:04    Assessment/Plan: Diagnosis: right AKA 1. Does the need for close, 24 hr/day medical supervision in concert with the patient's rehab needs make it unreasonable for this patient to be served in a less intensive setting? Yes and Potentially 2. Co-Morbidities requiring supervision/potential complications: dm2,htn, cad 3. Due to bladder management, bowel management, safety, skin/wound care, disease management, medication administration, pain management and patient education, does the patient require 24 hr/day rehab nursing? Yes 4. Does the patient require coordinated care of a physician, rehab nurse, PT (1-2 hrs/day, 5 days/week) and OT (1-2 hrs/day, 5 days/week) to address physical and functional deficits in the context of the above medical diagnosis(es)? Yes Addressing deficits in the following areas: balance, endurance, locomotion, strength, transferring, bowel/bladder control, bathing, dressing, feeding, grooming, toileting and psychosocial support 5. Can the patient actively participate in an intensive therapy program of at least 3 hrs of therapy per day at least 5 days per week? Yes 6. The potential for patient to make measurable gains while on  inpatient rehab is excellent 7. Anticipated functional outcomes upon discharge from inpatient rehab are modified independent  with PT, modified independent with OT, n/a with SLP. 8. Estimated rehab length of stay to reach the above functional goals is: potentially 7-10 days 9. Does the patient have adequate social supports and living environment to accommodate these discharge functional goals? Yes 10. Anticipated D/C setting: Home 11. Anticipated post D/C treatments: HH therapy and Outpatient therapy 12. Overall Rehab/Functional Prognosis: excellent  RECOMMENDATIONS: This patient's condition is appropriate for continued rehabilitative care in the following setting: CIR Patient has agreed to participate in recommended program. Yes Note that insurance prior authorization may be required for reimbursement for recommended care.  Comment: Rehab Admissions Coordinator to follow up.  Thanks,  Ranelle Oyster, MD, [place] [facility]     10/07/2014

## 2014-10-07 NOTE — Evaluation (Signed)
Occupational Therapy Evaluation Patient Details Name: [name] MRN: [id number] DOB: [birth year] Today's Date: 10/07/2014    History of Present Illness Pt admitted with RLE cellulitis. PMHx of HTN, HLD, DM, GERD, CHF, CKD, polysubstance abuse. 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   Clinical Impression   This 49 yo male admitted and underwent above presents to acute OT with decreased mobility, decreased balance, increased pain, and increased lethargy affecting his ability to care for himself at home at an independent to Mod I level as he was pta. He will benefit from acute OT with follow up on CIR.    Follow Up Recommendations  CIR;Supervision/Assistance - 24 hour    Equipment Recommendations   (TBD next venue)       Precautions / Restrictions Precautions Precautions: Fall Restrictions Weight Bearing Restrictions: No      Mobility Bed Mobility Overal bed mobility: Needs Assistance Bed Mobility: Rolling Rolling: Min assist (left and right)                    ADL Overall ADL's : Needs assistance/impaired Eating/Feeding: Set up;Sitting   Grooming: Minimal assistance;Bed level   Upper Body Bathing: Minimal assitance;Bed level   Lower Body Bathing: Total assistance;Bed level   Upper Body Dressing : Total assistance;Bed level   Lower Body Dressing: Total assistance;Bed level                       Vision Additional Comments: No change from baseline, wears glasses for reading          Pertinent Vitals/Pain Pain Assessment: 0-10 Pain Score: 9  Pain Location: R residual limb Pain Descriptors / Indicators: Sharp;Shooting;Spasm Pain Intervention(s): Monitored during session;Repositioned;Premedicated before session     Hand Dominance Right   Extremity/Trunk Assessment Upper Extremity Assessment Upper Extremity Assessment: Generalized weakness           Communication Communication Communication: No difficulties   Cognition Arousal/Alertness:  Lethargic Behavior During Therapy: Flat affect Overall Cognitive Status: Within Functional Limits for tasks assessed                                Home Living Family/patient expects to be discharged to:: Private residence Living Arrangements: Parent;Other relatives Available Help at Discharge: Family;Available 24 hours/day Type of Home: House Home Access: Stairs to enter [employer] of Steps: 6   Home Layout: One level     Bathroom Shower/Tub: Chief Strategy Officer: Standard     Home Equipment: Environmental consultant - 2 wheels;Wheelchair - manual;Cane - single point   Additional Comments: mom reports pt using a walker prior to this admission as he became weaker, moved in with family to start peritoneal dialysis, but was not started because of dogs in the house per mom      Prior Functioning/Environment Level of Independence: Independent             OT Diagnosis: Generalized weakness;Acute pain   OT Problem List: Decreased strength;Decreased range of motion;Decreased activity tolerance;Impaired balance (sitting and/or standing);Pain   T Treatment/Interventions: Self-care/ADL training;Therapeutic exercise;Patient/family education;Balance training;Therapeutic activities;DME and/or AE instruction    OT Goals(Current goals can be found in the care plan section) Acute Rehab OT Goals Patient Stated Goal: to get this pain better OT Goal Formulation: With patient Potential to Achieve Goals: Good  OT Frequency: Min 2X/week              End of  Session    Activity Tolerance: Patient limited by pain;Patient limited by lethargy Patient left: in bed;with call bell/phone within reach;with bed alarm set   Time: 1347-1411 OT Time Calculation (min): 24 min Charges:  OT General Charges $OT Visit: 1 Procedure OT Evaluation $Initial OT Evaluation Tier I: 1 Procedure OT Treatments $Therapeutic Activity: 8-22 mins  Evette Georges  [phone] 10/07/2014, 3:23 PM

## 2014-10-07 NOTE — Clinical Social Work Note (Signed)
CSW spoke with the pt's daughter [name]) [phone]. [name] reported that she would like the pt to go to [facility] or [facility]. CSW uploaded new clinicals to both agencies.  [name], MSW, LCSWA [phone]

## 2014-10-07 NOTE — Progress Notes (Signed)
Aline removed pressure held for 10 minutes and pressure dressing applied. 

## 2014-10-07 NOTE — Progress Notes (Signed)
Nutrition Follow-up  DOCUMENTATION CODES:  Severe malnutrition in context of chronic illness  INTERVENTION:   Continue Nepro Shake BID and Prostat 30 ml BID PO.  NUTRITION DIAGNOSIS:  Malnutrition related to chronic illness as evidenced by severe depletion of body fat, severe depletion of muscle mass.  Ongoing  GOAL:  Patient will meet greater than or equal to 90% of their needs  Unmet  MONITOR:  PO intake, Supplement acceptance, Skin, I & O's, Weight trends, Labs   ASSESSMENT:  Hx of uncontrolled DM, CHF, severe PAD, CAD s/p CABG, CKD IV, and ETOH abuse admitted with right leg pain/sweeling.   Patient started on HD 6/5. S/P right AKA on 6/13.   Currently on a heart healthy diet, consuming 10-25% of meals. Patient with increased nutrient needs for repletion of nutrient stores and wound healing.   Height:  Ht Readings from Last 1 Encounters:  10/06/14 5\' 11"  (1.803 m)    Weight:  Wt Readings from Last 1 Encounters:  10/07/14 168 lb 10.4 oz (76.5 kg)    Ideal Body Weight:  78 kg  Wt Readings from Last 10 Encounters:  10/07/14 168 lb 10.4 oz (76.5 kg)  08/07/14 194 lb 11.2 oz (88.315 kg)  02/10/14 200 lb 9.6 oz (90.992 kg)  11/07/13 185 lb 9.6 oz (84.188 kg)  10/01/13 189 lb (85.73 kg)  05/09/13 196 lb 3.2 oz (88.996 kg)  11/19/12 201 lb 9.6 oz (91.445 kg)  03/23/11 202 lb 2.6 oz (91.7 kg)    BMI:  Body mass index is 23.53 kg/(m^2).  Estimated Nutritional Needs:  Kcal:  2100 - 2300  Protein:  100 - 115 g  Fluid:  per MD  Skin:  Wound (see comment) (stage 2 pressure ulcer to sacrum; right AKA site)  Diet Order:  Diet Heart Room service appropriate?: Yes; Fluid consistency:: Thin  EDUCATION NEEDS:  Education needs no appropriate at this time   Intake/Output Summary (Last 24 hours) at 10/07/14 1542 Last data filed at 10/07/14 1400  Gross per 24 hour  Intake   1234 ml  Output    200 ml  Net   1034 ml    Last BM:  6/14   Joaquin Courts, RD, LDN, CNSC Pager [phone] After Hours Pager [phone]

## 2014-10-07 NOTE — Progress Notes (Signed)
Spoke to [facility] in hemodialysis to see if patient was going to dialysis tonight.I was told patient would not be going until around 0300 in morning.Will go ahead and give patient his bedtime medications.

## 2014-10-08 LAB — CBC
HCT: 28.9 % — ABNORMAL LOW (ref 39.0–52.0)
Hemoglobin: 9.5 g/dL — ABNORMAL LOW (ref 13.0–17.0)
MCH: 27.9 pg (ref 26.0–34.0)
MCHC: 32.9 g/dL (ref 30.0–36.0)
MCV: 84.8 fL (ref 78.0–100.0)
PLATELETS: 150 10*3/uL (ref 150–400)
RBC: 3.41 MIL/uL — ABNORMAL LOW (ref 4.22–5.81)
RDW: 15.7 % — AB (ref 11.5–15.5)
WBC: 8.9 10*3/uL (ref 4.0–10.5)

## 2014-10-08 LAB — COMPREHENSIVE METABOLIC PANEL
ALBUMIN: 2.1 g/dL — AB (ref 3.5–5.0)
ALT: 13 U/L — AB (ref 17–63)
AST: 19 U/L (ref 15–41)
Alkaline Phosphatase: 95 U/L (ref 38–126)
Anion gap: 15 (ref 5–15)
BUN: 31 mg/dL — ABNORMAL HIGH (ref 6–20)
CALCIUM: 8.3 mg/dL — AB (ref 8.9–10.3)
CO2: 21 mmol/L — ABNORMAL LOW (ref 22–32)
Chloride: 96 mmol/L — ABNORMAL LOW (ref 101–111)
Creatinine, Ser: 3.66 mg/dL — ABNORMAL HIGH (ref 0.61–1.24)
GFR calc Af Amer: 21 mL/min — ABNORMAL LOW (ref >60)
GFR calc non Af Amer: 18 mL/min — ABNORMAL LOW (ref >60)
Glucose, Bld: 136 mg/dL — ABNORMAL HIGH (ref 65–99)
Potassium: 3.7 mmol/L (ref 3.5–5.1)
Sodium: 132 mmol/L — ABNORMAL LOW (ref 135–145)
TOTAL PROTEIN: 5.7 g/dL — AB (ref 6.5–8.1)
Total Bilirubin: 2.6 mg/dL — ABNORMAL HIGH (ref 0.3–1.2)

## 2014-10-08 LAB — CBC WITH DIFFERENTIAL/PLATELET
Basophils Absolute: 0 10*3/uL (ref 0.0–0.1)
Basophils Relative: 0 % (ref 0–1)
EOS ABS: 0.1 10*3/uL (ref 0.0–0.7)
Eosinophils Relative: 1 % (ref 0–5)
HEMATOCRIT: 30.7 % — AB (ref 39.0–52.0)
HEMOGLOBIN: 10.1 g/dL — AB (ref 13.0–17.0)
LYMPHS ABS: 0.4 10*3/uL — AB (ref 0.7–4.0)
Lymphocytes Relative: 6 % — ABNORMAL LOW (ref 12–46)
MCH: 28.1 pg (ref 26.0–34.0)
MCHC: 32.9 g/dL (ref 30.0–36.0)
MCV: 85.3 fL (ref 78.0–100.0)
Monocytes Absolute: 0.7 10*3/uL (ref 0.1–1.0)
Monocytes Relative: 11 % (ref 3–12)
NEUTROS PCT: 82 % — AB (ref 43–77)
Neutro Abs: 5.2 10*3/uL (ref 1.7–7.7)
Platelets: 128 10*3/uL — ABNORMAL LOW (ref 150–400)
RBC: 3.6 MIL/uL — ABNORMAL LOW (ref 4.22–5.81)
RDW: 15.5 % (ref 11.5–15.5)
WBC: 6.5 10*3/uL (ref 4.0–10.5)

## 2014-10-08 LAB — GLUCOSE, CAPILLARY
GLUCOSE-CAPILLARY: 101 mg/dL — AB (ref 65–99)
GLUCOSE-CAPILLARY: 101 mg/dL — AB (ref 65–99)
GLUCOSE-CAPILLARY: 119 mg/dL — AB (ref 65–99)
Glucose-Capillary: 134 mg/dL — ABNORMAL HIGH (ref 65–99)
Glucose-Capillary: 121 mg/dL — ABNORMAL HIGH (ref 65–99)

## 2014-10-08 LAB — RENAL FUNCTION PANEL
ALBUMIN: 1.9 g/dL — AB (ref 3.5–5.0)
ANION GAP: 17 — AB (ref 5–15)
BUN: 67 mg/dL — AB (ref 6–20)
CO2: 21 mmol/L — ABNORMAL LOW (ref 22–32)
CREATININE: 6.12 mg/dL — AB (ref 0.61–1.24)
Calcium: 8.3 mg/dL — ABNORMAL LOW (ref 8.9–10.3)
Chloride: 94 mmol/L — ABNORMAL LOW (ref 101–111)
GFR calc Af Amer: 11 mL/min — ABNORMAL LOW (ref >60)
GFR calc non Af Amer: 10 mL/min — ABNORMAL LOW (ref >60)
GLUCOSE: 138 mg/dL — AB (ref 65–99)
POTASSIUM: 4.2 mmol/L (ref 3.5–5.1)
Phosphorus: 6.7 mg/dL — ABNORMAL HIGH (ref 2.5–4.6)
Sodium: 132 mmol/L — ABNORMAL LOW (ref 135–145)

## 2014-10-08 LAB — HEPARIN LEVEL (UNFRACTIONATED): HEPARIN UNFRACTIONATED: 0.52 [IU]/mL (ref 0.30–0.70)

## 2014-10-08 LAB — MAGNESIUM: Magnesium: 1.8 mg/dL (ref 1.7–2.4)

## 2014-10-08 MED ORDER — INSULIN ASPART 100 UNIT/ML ~~LOC~~ SOLN
0.0000 [IU] | Freq: Three times a day (TID) | SUBCUTANEOUS | Status: DC
  Administered 2014-10-09 – 2014-10-14 (×9): 1 [IU] via SUBCUTANEOUS
  Administered 2014-10-15: 2 [IU] via SUBCUTANEOUS
  Administered 2014-10-16: 1 [IU] via SUBCUTANEOUS
  Administered 2014-10-16 – 2014-10-17 (×2): 2 [IU] via SUBCUTANEOUS
  Administered 2014-10-17: 1 [IU] via SUBCUTANEOUS
  Administered 2014-10-17 – 2014-10-18 (×2): 2 [IU] via SUBCUTANEOUS

## 2014-10-08 MED ORDER — MORPHINE SULFATE 2 MG/ML IJ SOLN
INTRAMUSCULAR | Status: AC
  Filled 2014-10-08: qty 1

## 2014-10-08 MED ORDER — CALCIUM ACETATE (PHOS BINDER) 667 MG PO CAPS
1334.0000 mg | ORAL_CAPSULE | Freq: Three times a day (TID) | ORAL | Status: DC
  Administered 2014-10-08 – 2014-10-18 (×24): 1334 mg via ORAL
  Filled 2014-10-08 (×39): qty 2

## 2014-10-08 MED ORDER — DARBEPOETIN ALFA 100 MCG/0.5ML IJ SOSY
PREFILLED_SYRINGE | INTRAMUSCULAR | Status: AC
  Filled 2014-10-08: qty 0.5

## 2014-10-08 MED ORDER — HEPARIN SODIUM (PORCINE) 1000 UNIT/ML DIALYSIS
20.0000 [IU]/kg | INTRAMUSCULAR | Status: DC | PRN
  Filled 2014-10-08: qty 2

## 2014-10-08 NOTE — Progress Notes (Addendum)
[facility] Progress Note  Subjective  - POD #2  Having pain with right stump. Denies any pain in left lower extremity  Objective Filed Vitals:   10/08/14 0700  BP:   Pulse:   Temp: 97.8 F (36.6 C)  Resp:     Intake/Output Summary (Last 24 hours) at 10/08/14 0844 Last data filed at 10/08/14 0650  Gross per 24 hour  Intake   1191 ml  Output   4400 ml  Net  -3209 ml    Right AKA dressing with fresh sanguinous drainage on ACE wrap. Staple line with dried blood but no active bleeding. Incision is intact. Stump is warm with mild swelling.   DP doppler signal per nursing  Assessment/Planning: 49 y.o. male is s/p: right AKA 2 Days Post-Op   Dressing change performed this am. Stump is viable.  Retention sock ordered. Continue to follow doppler exam on left. Patient with dopplerable left DP. Currently asymptomatic so no intervention necessary. Patient is not revascularization candidate.   Raymond Gurney 10/08/2014 8:44 AM --  Laboratory CBC    Component Value Date/Time   WBC 8.9 10/08/2014 0010   HGB 9.5* 10/08/2014 0010   HCT 28.9* 10/08/2014 0010   PLT 150 10/08/2014 0010    BMET    Component Value Date/Time   NA 132* 10/08/2014 0002   K 4.2 10/08/2014 0002   CL 94* 10/08/2014 0002   CO2 21* 10/08/2014 0002   GLUCOSE 138* 10/08/2014 0002   BUN 67* 10/08/2014 0002   CREATININE 6.12* 10/08/2014 0002   CALCIUM 8.3* 10/08/2014 0002   GFRNONAA 10* 10/08/2014 0002   GFRAA 11* 10/08/2014 0002    COAG Lab Results  Component Value Date   INR 1.37 10/06/2014   INR 1.34 10/01/2014   INR 1.45 09/27/2014   No results found for: PTT  Antibiotics Anti-infectives    Start     Dose/Rate Route Frequency Ordered Stop   10/07/14 1315  vancomycin (VANCOCIN) IVPB 750 mg/150 ml premix     750 mg 150 mL/hr over 60 Minutes Intravenous  Once 10/07/14 1312 10/08/14 0348   10/04/14 1200  vancomycin (VANCOCIN) IVPB 750 mg/150 ml premix     750  mg 150 mL/hr over 60 Minutes Intravenous Every Sat (Hemodialysis) 10/03/14 1457 10/04/14 1311   10/02/14 1400  vancomycin (VANCOCIN) IVPB 750 mg/150 ml premix     750 mg 150 mL/hr over 60 Minutes Intravenous NOW 10/02/14 1351 10/02/14 1538   09/30/14 1200  vancomycin (VANCOCIN) IVPB 1000 mg/200 mL premix  Status:  Discontinued     1,000 mg 200 mL/hr over 60 Minutes Intravenous Every Dialysis 09/30/14 0954 09/30/14 1039   09/29/14 1500  vancomycin (VANCOCIN) IVPB 1000 mg/200 mL premix  Status:  Discontinued     1,000 mg 200 mL/hr over 60 Minutes Intravenous Every Dialysis 09/29/14 1023 09/30/14 0954   09/26/14 2200  vancomycin (VANCOCIN) IVPB 1000 mg/200 mL premix  Status:  Discontinued     1,000 mg 200 mL/hr over 60 Minutes Intravenous Every 48 hours 09/26/14 0908 09/29/14 1022   09/25/14 0300  piperacillin-tazobactam (ZOSYN) IVPB 2.25 g     2.25 g 100 mL/hr over 30 Minutes Intravenous 3 times per day 09/24/14 2334     09/25/14 0200  piperacillin-tazobactam (ZOSYN) IVPB 2.25 g  Status:  Discontinued     2.25 g 100 mL/hr over 30 Minutes Intravenous Every 6 hours 09/24/14 2030 09/24/14 2323   09/24/14 2330  piperacillin-tazobactam (ZOSYN) IVPB 2.25  g  Status:  Discontinued     2.25 g 100 mL/hr over 30 Minutes Intravenous 3 times per day 09/24/14 2323 09/24/14 2334   09/24/14 2100  vancomycin (VANCOCIN) IVPB 750 mg/150 ml premix     750 mg 150 mL/hr over 60 Minutes Intravenous  Once 09/24/14 2029 09/24/14 2325   09/24/14 1915  piperacillin-tazobactam (ZOSYN) IVPB 3.375 g     3.375 g 100 mL/hr over 30 Minutes Intravenous  Once 09/24/14 1911 09/24/14 2010   09/24/14 1915  vancomycin (VANCOCIN) IVPB 1000 mg/200 mL premix     1,000 mg 200 mL/hr over 60 Minutes Intravenous  Once 09/24/14 1911 09/24/14 2102       Maris Berger, PA-C [facility] Office: [phone] Pager: [phone] 10/08/2014 8:44 AM     I have examined the patient, reviewed and agree with  above. Discussed need for permanent access. Patient is much more comfortable. Does have reasonable/cephalic vein at the antecubital space on the left. He is right-handed. We'll plan left arm AV fistula on Friday 10/10/2014  Gretta Began, MD 10/08/2014 4:26 PM

## 2014-10-08 NOTE — Progress Notes (Signed)
Orthopedic Tech Progress Note Patient Details:  Shauna Varelas 1966-04-20 [id number]  Patient ID: Felecia Jan, male   DOB: [birth year], 49 y.o.   MRN: [id number] Called in bio-tech brace order; spoke with Anderson Malta, Jahel Wavra 10/08/2014, 9:22 AM

## 2014-10-08 NOTE — Progress Notes (Addendum)
ANTICOAGULATION / Antibiotic CONSULT NOTE - Follow Up Consult  Pharmacy Consult for Heparin / Vancomycin and Zosyn Indication: atrial fibrillation / s/p AKA  No Known Allergies   Assessment: 49 yo M admitted 09/24/2014 with progressive weakness and bilateral leg swelling with weeping edema. Pharmacy consulted to dose heparin for afib. Now s/p AKA and continues on antibiotics (since 6/1) No procedures planned for left leg  Plan: Continue heparin at 1800 units / hr - start po anticoagulation? Daily HL and CBC Continues on Vancomycin and Zosyn (stop antibiotics?)     Patient Measurements: Height: 5\' 11"  (180.3 cm) Weight: 166 lb 3.6 oz (75.4 kg) IBW/kg (Calculated) : 75.3  Vital Signs: Temp: 97.8 F (36.6 C) (06/15 0700) Temp Source: Oral (06/15 0700) BP: 146/75 mmHg (06/15 0700) Pulse Rate: 71 (06/15 0700)  Labs:  Recent Labs  10/06/14 0520 10/07/14 0540 10/08/14 0002 10/08/14 0010 10/08/14 0901  HGB 9.9*  --   --  9.5* 10.1*  HCT 29.9*  --   --  28.9* 30.7*  PLT 123*  --   --  150 128*  LABPROT 17.0*  --   --   --   --   INR 1.37  --   --   --   --   HEPARINUNFRC 0.43 0.34  --   --  0.52  CREATININE 5.00* 5.99* 6.12*  --  PENDING    CrCl cannot be calculated (Patient has no serum creatinine result on file.).   Thank you Okey Regal, PharmD [phone]   10/08/2014 10:32 AM

## 2014-10-08 NOTE — Progress Notes (Signed)
Orthopedic Tech Progress Note Patient Details:  [name] [birth year] [id number]  Patient ID: [name], male   DOB: [birth year], 49 y.o.   MRN: [id number] Called in bio-tech brace order; spoke with [name], [name] 10/08/2014, 9:27 AM

## 2014-10-08 NOTE — Progress Notes (Signed)
Admit: 09/24/2014 LOS: 14  5M new ESRD, PAD with AKA RLE 10/06/14  Subjective:  No new events HD overnight, 4L UF, well tolerated   06/14 0701 - 06/15 0700 In: 1219 [P.O.:680; I.V.:539] Out: 4400 [Urine:400]  Filed Weights   10/07/14 0326 10/07/14 2330 10/08/14 0349  Weight: 76.5 kg (168 lb 10.4 oz) 79.8 kg (175 lb 14.8 oz) 75.4 kg (166 lb 3.6 oz)    Scheduled Meds: . amiodarone  400 mg Oral BID  . darbepoetin (ARANESP) injection - DIALYSIS  100 mcg Intravenous Q Tue-HD  . feeding supplement (NEPRO CARB STEADY)  237 mL Oral BID BM  . feeding supplement (PRO-STAT SUGAR FREE 64)  30 mL Oral BID  . ferumoxytol  510 mg Intravenous Q Tue-HD  . folic acid  1 mg Oral Daily  . insulin aspart  0-9 Units Subcutaneous 6 times per day  . metoprolol tartrate  25 mg Oral BID  . multivitamin  1 tablet Oral QHS  . pantoprazole  40 mg Oral Q1200  . piperacillin-tazobactam (ZOSYN)  IV  2.25 g Intravenous 3 times per day  . polyethylene glycol  17 g Oral BID  . senna-docusate  1 tablet Oral BID  . sodium chloride  3 mL Intravenous Q12H  . thiamine  100 mg Oral Daily   Continuous Infusions: . sodium chloride 10 mL/hr at 10/07/14 1800  . heparin 1,800 Units/hr (10/08/14 0435)   PRN Meds:.acetaminophen, morphine injection, nitroGLYCERIN, ondansetron (ZOFRAN) IV, oxyCODONE  Current Labs: reviewed    Physical Exam:  Blood pressure 146/75, pulse 71, temperature 97.8 F (36.6 C), temperature source Oral, resp. rate 18, height  (1.803 m), weight 75.4 kg (166 lb 3.6 oz), SpO2 99 %. NAD Multiple tattoos RRR No Edema EOMI Poor dentition  A/P 1. New ESRD 1. Started 6/5 2. Next 6/16 3. Using [facility], Permanent access deferred by [facility] to this point 2/2 acute issues, cont to discuss timing with them 4. CLIP in process; will need to coordinate with ?SNF vs CIR 5. 4h, Tight Heparin 2. HTN/Vol 1. Weights labile with inpt stay and BKA 2. Goal 3.5L next HD 3. BP stable only on  MTP 3. Anemia 1. Aranesp 100 qTues 2. Feraheme to be given Tues 4. PAD and ischemic RLE, 1. 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 CKD-BMD 1. PTH = 97 on 09/29/14 2. Phos up, add PhosLo to meals 3. Follow 6. AMS, improved 7. Episode of VT req cardioversion 6/6, on amio 8. AFib 9. Systolic HF: cont to lower EDW 10. CAD s/p CABG  Sabra Heck MD 10/08/2014, 10:45 AM   Recent Labs Lab 10/02/14 0238  10/06/14 0520 10/07/14 0540 10/08/14 0002 10/08/14 0901  NA 137  < > 134* 132* 132* 132*  K 3.7  < > 4.1 4.3 4.2 3.7  CL 99*  < > 98* 94* 94* 96*  CO2 20*  < > 21* 19* 21* 21*  GLUCOSE 161*  < > 167* 125* 138* 136*  BUN 51*  < > 52* 60* 67* 31*  CREATININE 4.63*  < > 5.00* 5.99* 6.12* 3.66*  CALCIUM 8.2*  < > 8.2* 8.2* 8.3* 8.3*  PHOS 5.7*  --  5.1*  --  6.7*  --   < > = values in this interval not displayed.  Recent Labs Lab 10/06/14 0520 10/08/14 0010 10/08/14 0901  WBC 7.0 8.9 6.5  NEUTROABS  --   --  5.2  HGB 9.9* 9.5* 10.1*  HCT 29.9* 28.9* 30.7*  MCV 86.4 84.8 85.3  PLT 123* 150 128*

## 2014-10-08 NOTE — Progress Notes (Signed)
Medicare Important Message given? YES (If response is "NO", the following Medicare IM given date fields will be blank) Date Medicare IM given:10/08/14 Medicare IM given by: Telesa Jeancharles 

## 2014-10-08 NOTE — Progress Notes (Signed)
Rehab admissions - Evaluated for possible admission.  Currently rehab beds are full.  Only limited beds available tomorrow.  Agree with plans for SNF since limited beds open on inpatient rehab.  Call me for questions.  #[phone]

## 2014-10-08 NOTE — Progress Notes (Signed)
[place] TEAM 1 - Stepdown/ICU TEAM Progress Note  Romain Erion BJY:[id number] DOB: [birth year] DOA: 09/24/2014  PCP: [name], NP  Admit HPI / Brief Narrative: 49 y.o. M Hx hypertension, hyperlipidemia, diabetes mellitus, GERD, CAD S/P CABG 2010, combined systolic and diastolic congestive heart failure (EF 20-25% with grade 3 diastolic dysfunction), chronic kidney disease stage IV, severe PAD, and substance abuse who presented with right leg swelling and pain for several days. His legs became red from foot to upper thigh medially. He did not have fever or chills. Patient was unable to ambulate secondary to pain.   In ED patient was found to have WBC 13.8, tachycardia, normal temperature, stable blood pressure, AoCKD-IV, negative chest x-ray for acute abnormalities, ALP 168, AST 27, ALT 15, total bilirubin 4.4, lactate 1.85, INR 1.39, urinalysis with small amount of leukocytes.   HPI/Subjective: The patient appears to be resting comfortably in bed.  He complains of unrelenting low back pain.  He denies chest pain shortness of breath fevers chills nausea or vomiting.  Assessment/Plan:  Metabolic Encephalopathy -Multifactorial to include uremia, worsening cardiac function, sedating medication -Resolved with patient now at apparent baseline  New ESRD on chronic renal failure  -Previous creatinine 5.85 on 03/11/14 -Ongoing hemodialysis per Nephrology team via R chest HD cath - [facility] considering the timing of permanent access  Episode of monomorphic VT / wide complex tachycardia  Required cardioversion 6/6 PM - on amio per EP - now transitioned to oral amio as recommended by EP - maintaining sinus rhythm - watch on tele for now   Acute onset Afib 6/6 CHADS2VASC is at least 3 - currently maintaining sinus rhythm - on IV heparin - transition to coumadin when timing of further procedures more clear   PAD - R LE ischemia Now s/p R AKA - V[facility] Surgery following - L LE threatening - follow  serial exam   Bilateral lower extremity cellulitis w/ sepsis due to unspecified organism The acute infectious component of his lower extremity disease appears to have been adequately treated at this time - d/c abx today   Jaundice  total bilirubin fluctuating - acute Hepatitis panel negative - RPR negative - anti-nuclear antibody negative - HIV negative -Hepatic ultrasound reveals normal appearing parenchyma with ascites (likely due to ESRD)   CAD s/p CABG 2010  Chronic Systolic CHF / Cardiomyopathy  -TTE 02/21/14 showed EF 20-25% with grade 3 diastolic dysfunction.  -seen by Dr. Royann Shivers on 08/07/14 w/ decision regarding cardiac device therapy depending heavily on his decision regarding dialysis - ICD was to be considered if he started dialysis. -6/3 TTE shows cardiomyopathy with worsening EF now at 20%  -EP does not feel he is presently a candidate for aggressive procedures -Volume management per hemodialysis  HTN -Blood pressures currently reasonably controlled   Hyperlipidemia  -lipid panel abnormal  DM type-II  -6/3 A1c 6.4 - CBG currently controlled   Hypokalemia Resolved   Code Status: FULL Family Communication: no family present at time of exam today  Disposition Plan: stable for transfer to renal tele bed - timing of permanent HD access per [facility] - initiate warfarin when able - probable CIR or SNF of eventual disposition   Consultants: Nephrology V[facility] Surgery EP  Procedure/Significant Events: 6/3 TTE mild LVH - EF= 20%. Diffuse hypokinesis - Left atrium:  moderately dilated - Right ventricle: mildly to moderately dilated. - Right atrium: The atrium was mildly dilated.  Antibiotics: Vancomycin 6 /1 > 6/15 Zosyn 6/1 > 6/15  DVT prophylaxis:  IV heparin  Objective: Blood pressure 126/63, pulse 65, temperature 98.3 F (36.8 C), temperature source Oral, resp. rate 13, height 5\' 11"  (1.803 m), weight 75.4 kg (166 lb 3.6 oz), SpO2 99 %.  Intake/Output Summary (Last  24 hours) at 10/08/14 1410 Last data filed at 10/08/14 0650  Gross per 24 hour  Intake    463 ml  Output   4200 ml  Net  -3737 ml   Exam: General: No acute respiratory distress - alert and conversant - oriented  Lungs: Clear to auscultation bilaterally without wheezes/crackles Cardiovascular: Regular rate and rhythm without murmur gallop or rub   Abdomen: Nontender, mildly protuberant, soft, bowel sounds hypoactive, no rebound, no mass Extremities: No significant cyanosis, clubbing, edema L LE - R AKA site dressed and dry  Cutaneous:  stable anterior tibial ulceration L LE  Data Reviewed: Basic Metabolic Panel:  Recent Labs Lab 10/02/14 0238 10/03/14 0235  10/05/14 0248 10/06/14 0520 10/07/14 0540 10/08/14 0002 10/08/14 0901  NA 137 136  < > 134* 134* 132* 132* 132*  K 3.7 3.7  < > 4.0 4.1 4.3 4.2 3.7  CL 99* 96*  < > 96* 98* 94* 94* 96*  CO2 20* 23  < > 23 21* 19* 21* 21*  GLUCOSE 161* 171*  < > 75 167* 125* 138* 136*  BUN 51* 31*  < > 38* 52* 60* 67* 31*  CREATININE 4.63* 3.68*  < > 4.09* 5.00* 5.99* 6.12* 3.66*  CALCIUM 8.2* 8.2*  < > 8.2* 8.2* 8.2* 8.3* 8.3*  MG  --  2.0  --   --   --   --   --  1.8  PHOS 5.7*  --   --   --  5.1*  --  6.7*  --   < > = values in this interval not displayed.   Liver Function Tests:  Recent Labs Lab 10/03/14 0235 10/04/14 0335 10/05/14 0248 10/06/14 0520 10/08/14 0002 10/08/14 0901  AST 22 20 7*  --   --  19  ALT 12* 13* 11*  --   --  13*  ALKPHOS 90 92 107  --   --  95  BILITOT 2.8* 2.5* 2.3*  --   --  2.6*  PROT 5.9* 6.2* 6.2*  --   --  5.7*  ALBUMIN 2.2* 2.2* 2.3* 2.1* 1.9* 2.1*   CBC:  Recent Labs Lab 10/04/14 0335 10/05/14 0248 10/06/14 0520 10/08/14 0010 10/08/14 0901  WBC 8.6 10.1 7.0 8.9 6.5  NEUTROABS  --   --   --   --  5.2  HGB 10.2* 10.3* 9.9* 9.5* 10.1*  HCT 32.3* 32.2* 29.9* 28.9* 30.7*  MCV 87.8 88.2 86.4 84.8 85.3  PLT 129* 143* 123* 150 128*   CBG:  Recent Labs Lab 10/07/14 2007  10/07/14 2301 10/08/14 0438 10/08/14 0736 10/08/14 1235  GLUCAP 124* 139* 101* 134* 121*   Studies:  Recent x-ray studies have been reviewed in detail by the Attending Physician  Scheduled Meds:  Scheduled Meds: . amiodarone  400 mg Oral BID  . calcium acetate  1,334 mg Oral TID WC  . darbepoetin (ARANESP) injection - DIALYSIS  100 mcg Intravenous Q Tue-HD  . feeding supplement (NEPRO CARB STEADY)  237 mL Oral BID BM  . feeding supplement (PRO-STAT SUGAR FREE 64)  30 mL Oral BID  . ferumoxytol  510 mg Intravenous Q Tue-HD  . folic acid  1 mg Oral Daily  . insulin aspart  0-9  Units Subcutaneous 6 times per day  . metoprolol tartrate  25 mg Oral BID  . multivitamin  1 tablet Oral QHS  . pantoprazole  40 mg Oral Q1200  . piperacillin-tazobactam (ZOSYN)  IV  2.25 g Intravenous 3 times per day  . polyethylene glycol  17 g Oral BID  . senna-docusate  1 tablet Oral BID  . sodium chloride  3 mL Intravenous Q12H  . thiamine  100 mg Oral Daily    Time spent on care of this patient: 25 mins  Lonia Blood, MD [facility] For Consults/Admissions - Flow Manager - [phone] Office  [phone]  Contact MD directly via text page:      [email or web address]      password [facility]  10/08/2014, 2:10 PM   LOS: 14 days

## 2014-10-09 DIAGNOSIS — I70261 Atherosclerosis of native arteries of extremities with gangrene, right leg: Secondary | ICD-10-CM

## 2014-10-09 DIAGNOSIS — I25119 Atherosclerotic heart disease of native coronary artery with unspecified angina pectoris: Secondary | ICD-10-CM

## 2014-10-09 LAB — RENAL FUNCTION PANEL
ALBUMIN: 2 g/dL — AB (ref 3.5–5.0)
Anion gap: 17 — ABNORMAL HIGH (ref 5–15)
BUN: 39 mg/dL — AB (ref 6–20)
CALCIUM: 8.3 mg/dL — AB (ref 8.9–10.3)
CO2: 22 mmol/L (ref 22–32)
CREATININE: 4.55 mg/dL — AB (ref 0.61–1.24)
Chloride: 92 mmol/L — ABNORMAL LOW (ref 101–111)
GFR calc Af Amer: 16 mL/min — ABNORMAL LOW (ref >60)
GFR, EST NON AFRICAN AMERICAN: 14 mL/min — AB (ref >60)
Glucose, Bld: 121 mg/dL — ABNORMAL HIGH (ref 65–99)
Phosphorus: 5.6 mg/dL — ABNORMAL HIGH (ref 2.5–4.6)
Potassium: 3.6 mmol/L (ref 3.5–5.1)
Sodium: 131 mmol/L — ABNORMAL LOW (ref 135–145)

## 2014-10-09 LAB — GLUCOSE, CAPILLARY
GLUCOSE-CAPILLARY: 116 mg/dL — AB (ref 65–99)
Glucose-Capillary: 118 mg/dL — ABNORMAL HIGH (ref 65–99)
Glucose-Capillary: 109 mg/dL — ABNORMAL HIGH (ref 65–99)
Glucose-Capillary: 140 mg/dL — ABNORMAL HIGH (ref 65–99)

## 2014-10-09 LAB — CBC
HCT: 27.3 % — ABNORMAL LOW (ref 39.0–52.0)
Hemoglobin: 9.1 g/dL — ABNORMAL LOW (ref 13.0–17.0)
MCH: 28.3 pg (ref 26.0–34.0)
MCHC: 33.3 g/dL (ref 30.0–36.0)
MCV: 85 fL (ref 78.0–100.0)
PLATELETS: 136 10*3/uL — AB (ref 150–400)
RBC: 3.21 MIL/uL — ABNORMAL LOW (ref 4.22–5.81)
RDW: 15.6 % — AB (ref 11.5–15.5)
WBC: 6.3 10*3/uL (ref 4.0–10.5)

## 2014-10-09 LAB — HEPARIN LEVEL (UNFRACTIONATED)
Heparin Unfractionated: >2.2 IU/mL — ABNORMAL HIGH (ref 0.30–0.70)
Heparin Unfractionated: 0.31 IU/mL (ref 0.30–0.70)

## 2014-10-09 LAB — CLOSTRIDIUM DIFFICILE BY PCR: Toxigenic C. Difficile by PCR: POSITIVE — AB

## 2014-10-09 LAB — HEPATITIS B SURFACE ANTIBODY,QUALITATIVE: HEP B S AB: REACTIVE

## 2014-10-09 LAB — HEPATITIS B CORE ANTIBODY, TOTAL: Hep B Core Total Ab: POSITIVE — AB

## 2014-10-09 MED ORDER — MORPHINE SULFATE 4 MG/ML IJ SOLN
INTRAMUSCULAR | Status: AC
  Administered 2014-10-09: 4 mg
  Filled 2014-10-09: qty 1

## 2014-10-09 MED ORDER — CEFAZOLIN SODIUM 1-5 GM-% IV SOLN
1.0000 g | INTRAVENOUS | Status: AC
  Administered 2014-10-10: 1 g via INTRAVENOUS
  Filled 2014-10-09: qty 50

## 2014-10-09 MED ORDER — VANCOMYCIN 50 MG/ML ORAL SOLUTION
125.0000 mg | Freq: Four times a day (QID) | ORAL | Status: DC
  Administered 2014-10-09 – 2014-10-18 (×34): 125 mg via ORAL
  Filled 2014-10-09 (×40): qty 2.5

## 2014-10-09 MED ORDER — OXYCODONE HCL 5 MG PO TABS
ORAL_TABLET | ORAL | Status: AC
  Administered 2014-10-09: 10 mg
  Filled 2014-10-09: qty 2

## 2014-10-09 NOTE — Progress Notes (Addendum)
  Progress Note    10/09/2014 2:35 PM 3 Days Post-Op  Subjective:  C/o pain  Afebrile   Filed Vitals:   10/09/14 1143  BP: 145/72  Pulse: 65  Temp: 98 F (36.7 C)  Resp: 21    Physical Exam: Incisions:  C/d/i with staples in tact Extremities:  Good ROM  CBC    Component Value Date/Time   WBC 6.3 10/09/2014 0500   RBC 3.21* 10/09/2014 0500   HGB 9.1* 10/09/2014 0500   HCT 27.3* 10/09/2014 0500   PLT 136* 10/09/2014 0500   MCV 85.0 10/09/2014 0500   MCH 28.3 10/09/2014 0500   MCHC 33.3 10/09/2014 0500   RDW 15.6* 10/09/2014 0500   LYMPHSABS 0.4* 10/08/2014 0901   MONOABS 0.7 10/08/2014 0901   EOSABS 0.1 10/08/2014 0901   BASOSABS 0.0 10/08/2014 0901    BMET    Component Value Date/Time   NA 131* 10/09/2014 0826   K 3.6 10/09/2014 0826   CL 92* 10/09/2014 0826   CO2 22 10/09/2014 0826   GLUCOSE 121* 10/09/2014 0826   BUN 39* 10/09/2014 0826   CREATININE 4.55* 10/09/2014 0826   CALCIUM 8.3* 10/09/2014 0826   GFRNONAA 14* 10/09/2014 0826   GFRAA 16* 10/09/2014 0826    INR    Component Value Date/Time   INR 1.37 10/06/2014 0520     Intake/Output Summary (Last 24 hours) at 10/09/14 1435 Last data filed at 10/09/14 1143  Gross per 24 hour  Intake   1488 ml  Output   3705 ml  Net  -2217 ml     Assessment/Plan:  49 y.o. male is s/p right above knee amputation  3 Days Post-Op  -stump is viable -good ROM -continue pain control -continue dressing changes daily and placement of retention sock. -po vancomycin started for C diff   Doreatha Massed, PA-C [facility] [phone] 10/09/2014 2:35 PM

## 2014-10-09 NOTE — Progress Notes (Signed)
Pt discussed in LOS meeting. Jeanise Durfey RN,BSN, CM [phone] 

## 2014-10-09 NOTE — Progress Notes (Signed)
ANTICOAGULATION CONSULT NOTE - Follow Up Consult  Pharmacy Consult for heparin  Indication: atrial fibrillation  No Known Allergies  Patient Measurements: Height: 5\' 11"  (180.3 cm) Weight: 154 lb 8.7 oz (70.1 kg) IBW/kg (Calculated) : 75.3   Vital Signs: Temp: 98 F (36.7 C) (06/16 1143) Temp Source: Oral (06/16 1143) BP: 145/72 mmHg (06/16 1143) Pulse Rate: 65 (06/16 1143)  Labs:  Recent Labs  10/08/14 0002  10/08/14 0010 10/08/14 0901 10/09/14 0500 10/09/14 0826 10/09/14 1049  HGB  --   < > 9.5* 10.1* 9.1*  --   --   HCT  --   --  28.9* 30.7* 27.3*  --   --   PLT  --   --  150 128* 136*  --   --   HEPARINUNFRC  --   --   --  0.52 >2.20*  --  0.31  CREATININE 6.12*  --   --  3.66*  --  4.55*  --   < > = values in this interval not displayed.  Estimated Creatinine Clearance: 19.3 mL/min (by C-G formula based on Cr of 4.55).    Assessment: Pharmacy dosing heparin for new onset afib.  AM heparin level > 2.2 on 1800 units/hr - drawn while pt in HD - did not make sense b/c HL has been therapeutic for days.  HL redraw is therapeutic at 0.31.   H/H down 9.1/27.3, plct low 136, but upward trend.  No overt s/sx bleeding reported.  Plan to start coumadin after AV fistula placed Friday 6/17.  On amio 400 mg bid which will increase INR.    Goal of Therapy:  Heparin level 0.3-0.7 units/ml Monitor platelets by anticoagulation protocol: Yes   Plan:  -continue heparin drip at 1800 units/hr -plan to start coumadin after [facility] places AV fistula Friday 6/17 -daily HL, CBC  Herby Abraham, Pharm.D. [phone] 10/09/2014 12:15 PM

## 2014-10-09 NOTE — Progress Notes (Signed)
Rehab admissions - continuing to recommend SNF placement as we have only limited bed availability at this time.  Currently rehab beds are full.  I will sign off for inpatient rehab at this time.  #[phone]

## 2014-10-09 NOTE — Progress Notes (Signed)
Occupational Therapy Treatment Patient Details Name: [name] MRN: [id number] DOB: Sep 18, 1965 Today's Date: 10/09/2014    History of present illness Pt admitted with RLE cellulitis. PMHx of HTN, HLD, DM, GERD, CHF, CKD, polysubstance abuse. 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   OT comments  Focus of session on sitting balance at EOB while engaged in ADL and UB strengthening.  Issued level 2 theraband and educated pt in use.    Follow Up Recommendations  SNF;Supervision/Assistance - 24 hour (no CIR beds available)    Equipment Recommendations  3 in 1 bedside comode;Wheelchair (measurements OT);Wheelchair cushion (measurements OT)    Recommendations for Other Services      Precautions / Restrictions Precautions Precautions: Fall       Mobility Bed Mobility Overal bed mobility: Needs Assistance Bed Mobility: Supine to Sit;Sit to Supine     Supine to sit: Min assist Sit to supine: Min guard   General bed mobility comments: assist to raise trunk, use of rail  Transfers                      Balance Overall balance assessment: Needs assistance   Sitting balance-Leahy Scale: Fair Sitting balance - Comments: mild posterior lean, but able to self correct                           ADL Overall ADL's : Needs assistance/impaired     Grooming: Sitting;Wash/dry hands;Wash/dry face;Brushing hair;Supervision/safety           Upper Body Dressing : Nature conservation officer   Behavior During Therapy: Flat affect Overall Cognitive Status: Within Functional Limits for tasks assessed                       Extremity/Trunk Assessment               Exercises General Exercises - Upper Extremity Shoulder Flexion: Strengthening;Both;10 reps;Supine;Theraband Theraband Level (Shoulder Flexion): Level 2 (Red) Shoulder Extension:  Strengthening;Both;10 reps;Supine;Theraband Theraband Level (Shoulder Extension): Level 2 (Red) Shoulder Horizontal ABduction: Strengthening;Both;10 reps;Supine;Theraband Theraband Level (Shoulder Horizontal Abduction): Level 2 (Red) Shoulder Horizontal ADduction: Strengthening;Both;10 reps;Supine;Theraband Theraband Level (Shoulder Horizontal Adduction): Level 2 (Red) Elbow Flexion: Strengthening;Both;10 reps;Supine;Theraband Theraband Level (Elbow Flexion): Level 2 (Red) Elbow Extension: Strengthening;Both;10 reps;Supine;Theraband Theraband Level (Elbow Extension): Level 2 (Red)   Shoulder Instructions       General Comments      Pertinent Vitals/ Pain       Pain Assessment: Faces Faces Pain Scale: Hurts even more Pain Location: R LE Pain Descriptors / Indicators: Aching (phantom pain) Pain Intervention(s): Monitored during session;Repositioned;Limited activity within patient's tolerance;Premedicated before session  Home Living                                          Prior Functioning/Environment              Frequency Min 2X/week     Progress Toward Goals  OT Goals(current goals can now be found in the care plan  section)  Progress towards OT goals: Progressing toward goals  Acute Rehab OT Goals Patient Stated Goal: to get this pain better Time For Goal Achievement: 10/23/14 Potential to Achieve Goals: Good  Plan Discharge plan remains appropriate    Co-evaluation                 End of Session     Activity Tolerance Patient tolerated treatment well   Patient Left in bed;with call bell/phone within reach   Nurse Communication          Time: 1624-4695 OT Time Calculation (min): 24 min  Charges: OT General Charges $OT Visit: 1 Procedure OT Treatments $Self Care/Home Management : 8-22 mins $Therapeutic Exercise: 8-22 mins  Evern Bio 10/09/2014, 4:03 PM  [phone]

## 2014-10-09 NOTE — Progress Notes (Signed)
+   CDiff per lab [facility] RN

## 2014-10-09 NOTE — H&P (Signed)
[facility] PROGRESS NOTE  [name] BVA:[id number] DOB: 1966-01-25 DOA: 09/24/2014 PCP: [name], NP  Assessment/Plan:  Metabolic Encephalopathy Patient presented with metabolic encephalopathy likely from uremia worsening cardiac function and sedating medications. At this time the metabolic insult. As his resolved and patient is at baseline.   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   Episode of monomorphic VT / wide complex tachycardia  Patient had episode of wide complex tachycardia in the hospital which required cardioversion on 09/29/2014. Currently patient is on oral amiodarone as recommended by the EP. We'll continue to monitor the patient on telemetry   Acute onset Afib 6/6 Patient acute onset A. fib, chads 2vasc  score is 3, patient is currently on IV heparin. Will start Coumadin index 2- 3 days.   PAD - R LE ischemia Now s/p R AKA - Vasc Surgery following -   Bilateral lower extremity cellulitis w/ sepsis due to unspecified organism Patient presented with bilateral lower extremity cellulitis and was started on IV antibiotic switch were discontinued yesterday. Patient continues to be afebrile. We'll check CBC in a.m.   Jaundice  total bilirubin fluctuating - acute Hepatitis panel negative - RPR negative - anti-nuclear antibody negative - HIV negative -Hepatic ultrasound reveals normal appearing parenchyma with ascites (likely due to ESRD)   CAD s/p CABG 2010 Chronic Systolic CHF / Cardiomyopathy  -TTE 02/21/14 showed EF 20-25% with grade 3 diastolic dysfunction.  -seen by Dr. Royann Shivers on 08/07/14 w/ decision regarding cardiac device therapy depending heavily on his decision regarding dialysis - ICD was to be considered if he started dialysis. -6/3 TTE shows cardiomyopathy with worsening EF now at 20%  -EP does not feel he  is presently a candidate for aggressive procedures -Volume management per hemodialysis  HTN -Blood pressures currently reasonably controlled   Hyperlipidemia  -lipid panel abnormal  DM type-II  -6/3 A1c 6.4 - CBG currently controlled   Code Status: Full code Family Communication: No family at bedside Disposition Plan: Skilled nursing facility   Consultants: Nephrology Vasc Surgery EP  Procedures:  Echocardiogram- EF 20%, mild LVH  Antibiotics: Vancomycin 6 /1 > 6/15 Zosyn 6/1 > 6/15  HPI/Subjective: 49 y.o. M Hx hypertension, hyperlipidemia, diabetes mellitus, GERD, CAD S/P CABG 2010, combined systolic and diastolic congestive heart failure (EF 20-25% with grade 3 diastolic dysfunction), chronic kidney disease stage IV, severe PAD, and substance abuse who presented with right leg swelling and pain for several days. His legs became red from foot to upper thigh medially. He did not have fever or chills. Patient was unable to ambulate secondary to pain.  Patient underwent right AKA 3 days ago. Complains of pain in the right stump.  Objective: Filed Vitals:   10/09/14 1719  BP: 135/67  Pulse: 67  Temp: 98 F (36.7 C)  Resp: 21    Intake/Output Summary (Last 24 hours) at 10/09/14 1730 Last data filed at 10/09/14 1700  Gross per 24 hour  Intake   1728 ml  Output   3505 ml  Net  -1777 ml   Filed Weights   10/08/14 2105 10/09/14 0809 10/09/14 1143  Weight: 74.844 kg (165 lb) 74 kg (163 lb 2.3 oz) 70.1 kg (154 lb 8.7 oz)    Exam:   General:  Appears in no acute distress  Cardiovascular: S1-S2 is regular, no murmurs rubs or gallops  Respiratory: Clear to auscultation  bilaterally, no wheezing or crackles  Abdomen: Soft, nontender, no organomegaly  Musculoskeletal: Status post right AKA, right stump in dressing   Data Reviewed: Basic Metabolic Panel:  Recent Labs Lab 10/03/14 0235  10/06/14 0520 10/07/14 0540 10/08/14 0002 10/08/14 0901  10/09/14 0826  NA 136  < > 134* 132* 132* 132* 131*  K 3.7  < > 4.1 4.3 4.2 3.7 3.6  CL 96*  < > 98* 94* 94* 96* 92*  CO2 23  < > 21* 19* 21* 21* 22  GLUCOSE 171*  < > 167* 125* 138* 136* 121*  BUN 31*  < > 52* 60* 67* 31* 39*  CREATININE 3.68*  < > 5.00* 5.99* 6.12* 3.66* 4.55*  CALCIUM 8.2*  < > 8.2* 8.2* 8.3* 8.3* 8.3*  MG 2.0  --   --   --   --  1.8  --   PHOS  --   --  5.1*  --  6.7*  --  5.6*  < > = values in this interval not displayed. Liver Function Tests:  Recent Labs Lab 10/03/14 0235 10/04/14 0335 10/05/14 0248 10/06/14 0520 10/08/14 0002 10/08/14 0901 10/09/14 0826  AST 22 20 7*  --   --  19  --   ALT 12* 13* 11*  --   --  13*  --   ALKPHOS 90 92 107  --   --  95  --   BILITOT 2.8* 2.5* 2.3*  --   --  2.6*  --   PROT 5.9* 6.2* 6.2*  --   --  5.7*  --   ALBUMIN 2.2* 2.2* 2.3* 2.1* 1.9* 2.1* 2.0*    Recent Labs Lab 10/04/14 0335  LIPASE 119*   No results for input(s): AMMONIA in the last 168 hours. CBC:  Recent Labs Lab 10/05/14 0248 10/06/14 0520 10/08/14 0010 10/08/14 0901 10/09/14 0500  WBC 10.1 7.0 8.9 6.5 6.3  NEUTROABS  --   --   --  5.2  --   HGB 10.3* 9.9* 9.5* 10.1* 9.1*  HCT 32.2* 29.9* 28.9* 30.7* 27.3*  MCV 88.2 86.4 84.8 85.3 85.0  PLT 143* 123* 150 128* 136*   Cardiac Enzymes: No results for input(s): CKTOTAL, CKMB, CKMBINDEX, TROPONINI in the last 168 hours. BNP (last 3 results)  Recent Labs  09/25/14 0410  BNP 2227.0*    ProBNP (last 3 results) No results for input(s): PROBNP in the last 8760 hours.  CBG:  Recent Labs Lab 10/08/14 1614 10/08/14 2150 10/09/14 0737 10/09/14 1249 10/09/14 1704  GLUCAP 101* 119* 118* 109* 140*    Recent Results (from the past 240 hour(s))  Clostridium Difficile by PCR (not at [facility])     Status: Abnormal   Collection Time: 10/09/14  4:33 AM  Result Value Ref Range Status   C difficile by pcr POSITIVE (A) NEGATIVE Final    Comment: CRITICAL RESULT CALLED TO, READ BACK BY AND  VERIFIED WITH: RN HAYWOOD,K AT 1610 [id number] [name]      Studies: No results found.  Scheduled Meds: . amiodarone  400 mg Oral BID  . calcium acetate  1,334 mg Oral TID WC  . [START ON 10/10/2014]  ceFAZolin (ANCEF) IV  1 g Intravenous On Call  . darbepoetin (ARANESP) injection - DIALYSIS  100 mcg Intravenous Q Tue-HD  . feeding supplement (NEPRO CARB STEADY)  237 mL Oral BID BM  . feeding supplement (PRO-STAT SUGAR FREE 64)  30 mL Oral BID  . folic acid  1 mg Oral Daily  . insulin aspart  0-9 Units Subcutaneous TID WC  . metoprolol tartrate  25 mg Oral BID  . multivitamin  1 tablet Oral QHS  . pantoprazole  40 mg Oral Q1200  . polyethylene glycol  17 g Oral BID  . senna-docusate  1 tablet Oral BID  . sodium chloride  3 mL Intravenous Q12H  . thiamine  100 mg Oral Daily  . vancomycin  125 mg Oral QID   Continuous Infusions: . sodium chloride 10 mL/hr at 10/07/14 1800  . heparin 1,800 Units/hr (10/08/14 1857)    Principal Problem:   Bilateral lower leg cellulitis Active Problems:   S/P CABG x 3:  March 2010: left internal mammary artery to distal left anterior descending, SVG to Circ marginal vessel, SVG to PDA   HTN (hypertension)   Hyperlipidemia   DM2 (diabetes mellitus, type 2)   Acute renal failure superimposed on stage 4 chronic kidney disease   CAD (coronary artery disease)   NSTEMI (non-ST elevated myocardial infarction)   PAD (peripheral artery disease)   GERD (gastroesophageal reflux disease)   Combined systolic and diastolic congestive heart failure   Cellulitis of right leg   Sepsis   Cellulitis   Constipation   Blood poisoning   Jaundice   Peripheral artery disease   CAD in native artery   Essential hypertension   HLD (hyperlipidemia)   Diabetes type 2, uncontrolled   Acute on chronic renal failure   Chronic combined systolic and diastolic CHF (congestive heart failure)   Metabolic encephalopathy   Cellulitis of leg, right   Cellulitis of leg,  left   Chronic systolic heart failure   Cardiomyopathy   Hypokalemia   Systolic CHF, chronic   Chronic systolic CHF (congestive heart failure)   Diabetes type 2, controlled   Protein-calorie malnutrition, severe   Sepsis affecting skin   Gangrene of foot   Pressure ulcer   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 If 7PM-7AM, please contact night-coverage at [email or web address], password [facility]  10/09/2014, 5:30 PM  LOS: 15 days

## 2014-10-09 NOTE — Progress Notes (Signed)
OT Cancellation Note  Patient Details Name: [name] MRN: [id number] DOB: 01-30-1966   Cancelled Treatment:    Reason Eval/Treat Not Completed: Patient at procedure or test/ unavailable (HD).  Evern Bio 10/09/2014, 11:29 AM

## 2014-10-09 NOTE — Procedures (Signed)
I was present at this dialysis session. I have reviewed the session itself and made appropriate changes.   Starting on 4K bath pending AM serum K.   Goal UF 3.5L.  Using [facility].  BP OK.  C DIff positive.  Pt c/o pain.    Sabra Heck  MD 10/09/2014, 8:58 AM

## 2014-10-09 NOTE — Progress Notes (Addendum)
Patient placed on enteric precautions due to loose stools. Sample sent to lab for testing.

## 2014-10-10 ENCOUNTER — Inpatient Hospital Stay (HOSPITAL_COMMUNITY): Payer: Medicare Other | Admitting: Anesthesiology

## 2014-10-10 ENCOUNTER — Encounter (HOSPITAL_COMMUNITY)
Admission: EM | Disposition: A | Discharge status: Skilled Nursing Facility | Payer: Self-pay | Source: Home / Self Care | Attending: Internal Medicine

## 2014-10-10 ENCOUNTER — Encounter (HOSPITAL_COMMUNITY): Payer: Self-pay | Admitting: Anesthesiology

## 2014-10-10 DIAGNOSIS — N186 End stage renal disease: Secondary | ICD-10-CM

## 2014-10-10 HISTORY — PX: AV FISTULA PLACEMENT: SHX1204

## 2014-10-10 LAB — BASIC METABOLIC PANEL
Anion gap: 12 (ref 5–15)
BUN: 25 mg/dL — ABNORMAL HIGH (ref 6–20)
CALCIUM: 8.5 mg/dL — AB (ref 8.9–10.3)
CO2: 24 mmol/L (ref 22–32)
Chloride: 98 mmol/L — ABNORMAL LOW (ref 101–111)
Creatinine, Ser: 3.61 mg/dL — ABNORMAL HIGH (ref 0.61–1.24)
GFR calc Af Amer: 21 mL/min — ABNORMAL LOW (ref >60)
GFR, EST NON AFRICAN AMERICAN: 18 mL/min — AB (ref >60)
GLUCOSE: 157 mg/dL — AB (ref 65–99)
Potassium: 4 mmol/L (ref 3.5–5.1)
Sodium: 134 mmol/L — ABNORMAL LOW (ref 135–145)

## 2014-10-10 LAB — CBC
HEMATOCRIT: 29.1 % — AB (ref 39.0–52.0)
HEMOGLOBIN: 9.5 g/dL — AB (ref 13.0–17.0)
MCH: 28.4 pg (ref 26.0–34.0)
MCHC: 32.6 g/dL (ref 30.0–36.0)
MCV: 87.1 fL (ref 78.0–100.0)
Platelets: 139 10*3/uL — ABNORMAL LOW (ref 150–400)
RBC: 3.34 MIL/uL — ABNORMAL LOW (ref 4.22–5.81)
RDW: 15.9 % — ABNORMAL HIGH (ref 11.5–15.5)
WBC: 7.1 10*3/uL (ref 4.0–10.5)

## 2014-10-10 LAB — GLUCOSE, CAPILLARY
GLUCOSE-CAPILLARY: 124 mg/dL — AB (ref 65–99)
Glucose-Capillary: 149 mg/dL — ABNORMAL HIGH (ref 65–99)
Glucose-Capillary: 128 mg/dL — ABNORMAL HIGH (ref 65–99)
Glucose-Capillary: 138 mg/dL — ABNORMAL HIGH (ref 65–99)
Glucose-Capillary: 142 mg/dL — ABNORMAL HIGH (ref 65–99)

## 2014-10-10 LAB — HEPARIN LEVEL (UNFRACTIONATED): Heparin Unfractionated: 0.47 IU/mL (ref 0.30–0.70)

## 2014-10-10 LAB — PROTIME-INR
INR: 1.22 (ref 0.00–1.49)
PROTHROMBIN TIME: 15.6 s — AB (ref 11.6–15.2)

## 2014-10-10 SURGERY — ARTERIOVENOUS (AV) FISTULA CREATION
Anesthesia: Monitor Anesthesia Care | Site: Arm Upper | Laterality: Left

## 2014-10-10 MED ORDER — ONDANSETRON HCL 4 MG/2ML IJ SOLN: 4.0000 mg | Freq: Once | INTRAMUSCULAR | Status: DC | PRN

## 2014-10-10 MED ORDER — SODIUM CHLORIDE 0.9 % IR SOLN
Status: DC | PRN
  Administered 2014-10-10: 09:00:00

## 2014-10-10 MED ORDER — OXYCODONE HCL 5 MG PO TABS: 5.0000 mg | ORAL_TABLET | Freq: Once | ORAL | Status: DC | PRN

## 2014-10-10 MED ORDER — WARFARIN - PHARMACIST DOSING INPATIENT: Freq: Every day | Status: DC

## 2014-10-10 MED ORDER — WARFARIN SODIUM 5 MG PO TABS
5.0000 mg | ORAL_TABLET | Freq: Once | ORAL | Status: AC
  Administered 2014-10-10: 5 mg via ORAL
  Filled 2014-10-10: qty 1

## 2014-10-10 MED ORDER — MIDAZOLAM HCL 2 MG/2ML IJ SOLN
INTRAMUSCULAR | Status: AC
  Filled 2014-10-10: qty 2

## 2014-10-10 MED ORDER — WARFARIN VIDEO
Freq: Once | Status: AC
  Administered 2014-10-10: 18:00:00

## 2014-10-10 MED ORDER — HYDROMORPHONE HCL 1 MG/ML IJ SOLN: 0.2500 mg | INTRAMUSCULAR | Status: DC | PRN

## 2014-10-10 MED ORDER — FENTANYL CITRATE (PF) 250 MCG/5ML IJ SOLN
INTRAMUSCULAR | Status: AC
  Filled 2014-10-10: qty 5

## 2014-10-10 MED ORDER — PATIENT'S GUIDE TO USING COUMADIN BOOK
Freq: Once | Status: AC
  Administered 2014-10-10: 17:00:00
  Filled 2014-10-10: qty 1

## 2014-10-10 MED ORDER — LIDOCAINE-EPINEPHRINE 0.5 %-1:200000 IJ SOLN
INTRAMUSCULAR | Status: AC
  Filled 2014-10-10: qty 1

## 2014-10-10 MED ORDER — MIDAZOLAM HCL 5 MG/5ML IJ SOLN
INTRAMUSCULAR | Status: DC | PRN
  Administered 2014-10-10: 1 mg via INTRAVENOUS

## 2014-10-10 MED ORDER — 0.9 % SODIUM CHLORIDE (POUR BTL) OPTIME
TOPICAL | Status: DC | PRN
Start: 2014-10-10 — End: 2014-10-10
  Administered 2014-10-10: 1000 mL

## 2014-10-10 MED ORDER — OXYCODONE HCL 5 MG/5ML PO SOLN: 5.0000 mg | Freq: Once | ORAL | Status: DC | PRN

## 2014-10-10 MED ORDER — LIDOCAINE-EPINEPHRINE 0.5 %-1:200000 IJ SOLN
INTRAMUSCULAR | Status: DC | PRN
  Administered 2014-10-10: 50 mL

## 2014-10-10 MED ORDER — OXYCODONE HCL 5 MG PO TABS
10.0000 mg | ORAL_TABLET | ORAL | Status: DC | PRN
  Administered 2014-10-10 – 2014-10-13 (×11): 10 mg via ORAL
  Filled 2014-10-10 (×10): qty 2

## 2014-10-10 MED ORDER — CEFAZOLIN SODIUM-DEXTROSE 2-3 GM-% IV SOLR
INTRAVENOUS | Status: AC
  Filled 2014-10-10: qty 50

## 2014-10-10 MED ORDER — PROPOFOL INFUSION 10 MG/ML OPTIME
INTRAVENOUS | Status: DC | PRN
  Administered 2014-10-10: 25 ug/kg/min via INTRAVENOUS

## 2014-10-10 MED ORDER — FENTANYL CITRATE (PF) 100 MCG/2ML IJ SOLN
INTRAMUSCULAR | Status: DC | PRN
  Administered 2014-10-10: 50 ug via INTRAVENOUS

## 2014-10-10 MED ORDER — HEPARIN (PORCINE) IN NACL 100-0.45 UNIT/ML-% IJ SOLN
1800.0000 [IU]/h | INTRAMUSCULAR | Status: DC
  Administered 2014-10-10 – 2014-10-11 (×3): 1800 [IU]/h via INTRAVENOUS
  Filled 2014-10-10 (×6): qty 250

## 2014-10-10 MED ORDER — SODIUM CHLORIDE 0.9 % IV SOLN: INTRAVENOUS | Status: DC

## 2014-10-10 MED ORDER — OXYCODONE HCL ER 15 MG PO T12A
15.0000 mg | EXTENDED_RELEASE_TABLET | Freq: Two times a day (BID) | ORAL | Status: DC
  Administered 2014-10-10 – 2014-10-12 (×5): 15 mg via ORAL
  Filled 2014-10-10 (×5): qty 1

## 2014-10-10 SURGICAL SUPPLY — 42 items
APL SKNCLS STERI-STRIP NONHPOA (GAUZE/BANDAGES/DRESSINGS) ×1
ARMBAND PINK RESTRICT EXTREMIT (MISCELLANEOUS) ×3 IMPLANT
BENZOIN TINCTURE PRP APPL 2/3 (GAUZE/BANDAGES/DRESSINGS) ×3 IMPLANT
CANISTER SUCTION 2500CC (MISCELLANEOUS) ×3 IMPLANT
CANNULA VESSEL 3MM 2 BLNT TIP (CANNULA) ×2 IMPLANT
CLIP LIGATING EXTRA MED SLVR (CLIP) ×3 IMPLANT
CLIP LIGATING EXTRA SM BLUE (MISCELLANEOUS) ×3 IMPLANT
CLOSURE WOUND 1/2 X4 (GAUZE/BANDAGES/DRESSINGS) ×1
COVER PROBE W GEL 5X96 (DRAPES) ×3 IMPLANT
DECANTER SPIKE VIAL GLASS SM (MISCELLANEOUS) ×3 IMPLANT
ELECT REM PT RETURN 9FT ADLT (ELECTROSURGICAL) ×3
ELECTRODE REM PT RTRN 9FT ADLT (ELECTROSURGICAL) ×1 IMPLANT
GAUZE SPONGE 2X2 8PLY STRL LF (GAUZE/BANDAGES/DRESSINGS) IMPLANT
GAUZE SPONGE 4X4 12PLY STRL (GAUZE/BANDAGES/DRESSINGS) ×3 IMPLANT
GLOVE BIO SURGEON STRL SZ 6.5 (GLOVE) ×1 IMPLANT
GLOVE BIO SURGEONS STRL SZ 6.5 (GLOVE) ×1
GLOVE BIOGEL PI IND STRL 6 (GLOVE) IMPLANT
GLOVE BIOGEL PI IND STRL 6.5 (GLOVE) IMPLANT
GLOVE BIOGEL PI IND STRL 7.0 (GLOVE) IMPLANT
GLOVE BIOGEL PI IND STRL 7.5 (GLOVE) IMPLANT
GLOVE BIOGEL PI INDICATOR 6 (GLOVE) ×2
GLOVE BIOGEL PI INDICATOR 6.5 (GLOVE) ×2
GLOVE BIOGEL PI INDICATOR 7.0 (GLOVE) ×2
GLOVE BIOGEL PI INDICATOR 7.5 (GLOVE) ×2
GLOVE ECLIPSE 7.0 STRL STRAW (GLOVE) ×2 IMPLANT
GLOVE SS BIOGEL STRL SZ 7.5 (GLOVE) ×1 IMPLANT
GLOVE SUPERSENSE BIOGEL SZ 7.5 (GLOVE) ×2
GOWN STRL REUS W/ TWL LRG LVL3 (GOWN DISPOSABLE) ×3 IMPLANT
GOWN STRL REUS W/TWL LRG LVL3 (GOWN DISPOSABLE) ×9
KIT BASIN OR (CUSTOM PROCEDURE TRAY) ×3 IMPLANT
KIT ROOM TURNOVER OR (KITS) ×3 IMPLANT
NS IRRIG 1000ML POUR BTL (IV SOLUTION) ×3 IMPLANT
PACK CV ACCESS (CUSTOM PROCEDURE TRAY) ×3 IMPLANT
PAD ARMBOARD 7.5X6 YLW CONV (MISCELLANEOUS) ×6 IMPLANT
SPONGE GAUZE 2X2 STER 10/PKG (GAUZE/BANDAGES/DRESSINGS) ×2
STRIP CLOSURE SKIN 1/2X4 (GAUZE/BANDAGES/DRESSINGS) ×2 IMPLANT
SUT PROLENE 6 0 CC (SUTURE) ×3 IMPLANT
SUT VIC AB 3-0 SH 27 (SUTURE) ×3
SUT VIC AB 3-0 SH 27X BRD (SUTURE) ×1 IMPLANT
TAPE CLOTH SURG 4X10 WHT LF (GAUZE/BANDAGES/DRESSINGS) ×2 IMPLANT
UNDERPAD 30X30 INCONTINENT (UNDERPADS AND DIAPERS) ×3 IMPLANT
WATER STERILE IRR 1000ML POUR (IV SOLUTION) ×3 IMPLANT

## 2014-10-10 NOTE — Progress Notes (Signed)
PT Cancellation Note  Patient Details Name: [name] MRN: [id number] DOB: January 05, 1966   Cancelled Treatment:    Reason Eval/Treat Not Completed: Patient declined, no reason specified Reports he is too tired to work with therapy today. Explained the benefits of being mobile and out of bed. Discussed risks of secondary complications. Pt continues to decline. Will continue to follow and work with patient as he is willing.  Berton Mount 10/10/2014, 4:54 PM [name], [place] [phone]

## 2014-10-10 NOTE — Anesthesia Preprocedure Evaluation (Addendum)
Anesthesia Evaluation  Patient identified by MRN, date of birth, ID band Patient awake    Reviewed: Allergy & Precautions, NPO status , Patient's Chart, lab work & pertinent test results  Airway Mallampati: II  TM Distance: >3 FB Neck ROM: Full    Dental   Pulmonary former smoker,  breath sounds clear to auscultation        Cardiovascular hypertension, + CAD, + Past MI, + CABG, + Peripheral Vascular Disease and +CHF Rhythm:Regular Rate:Normal  EF 20% on 09/26/14 echo   Neuro/Psych negative neurological ROS     GI/Hepatic GERD-  ,  Endo/Other  diabetes, Type 2, Insulin Dependent  Renal/GU ESRFRenal disease     Musculoskeletal   Abdominal   Peds  Hematology  (+) anemia ,   Anesthesia Other Findings   Reproductive/Obstetrics                            Anesthesia Physical Anesthesia Plan  ASA: III  Anesthesia Plan: MAC   Post-op Pain Management:    Induction: Intravenous  Airway Management Planned: Natural Airway and Simple Face Mask  Additional Equipment:   Intra-op Plan:   Post-operative Plan:   Informed Consent: I have reviewed the patients History and Physical, chart, labs and discussed the procedure including the risks, benefits and alternatives for the proposed anesthesia with the patient or authorized representative who has indicated his/her understanding and acceptance.     Plan Discussed with: CRNA  Anesthesia Plan Comments:         Anesthesia Quick Evaluation

## 2014-10-10 NOTE — Anesthesia Postprocedure Evaluation (Signed)
  Anesthesia Post-op Note  Patient: [name]  Procedure(s) Performed: Procedure(s): LEFT BRACHIAL TO CEPHALIC ARTERIOVENOUS (AV) FISTULA CREATION (Left)  Patient Location: PACU  Anesthesia Type:MAC  Level of Consciousness: awake and alert   Airway and Oxygen Therapy: Patient Spontanous Breathing  Post-op Pain: none  Post-op Assessment: Post-op Vital signs reviewed              Post-op Vital Signs: Reviewed  Last Vitals:  Filed Vitals:   10/10/14 1125  BP: 133/65  Pulse: 64  Temp: 36.7 C  Resp: 18    Complications: No apparent anesthesia complications

## 2014-10-10 NOTE — Clinical Social Work Placement (Signed)
   CLINICAL SOCIAL WORK PLACEMENT  NOTE  Date:  10/10/2014  Patient Details  Name: [name] MRN: [id number] Date of Birth: 09-Aug-1965  Clinical Social Work is seeking post-discharge placement for this patient at the Skilled  Nursing Facility level of care (*CSW will initial, date and re-position this form in  chart as items are completed):  Yes   Patient/family provided with [place] Clinical Social Work Department's list of facilities offering this level of care within the geographic area requested by the patient (or if unable, by the patient's family).  Yes   Patient/family informed of their freedom to choose among providers that offer the needed level of care, that participate in Medicare, Medicaid or managed care program needed by the patient, have an available bed and are willing to accept the patient.  Yes   Patient/family informed of [facility]'s ownership interest in [facility] and [facility], as well as of the fact that they are under no obligation to receive care at these facilities.  PASRR submitted to EDS on 09/30/14     PASRR number received on 09/30/14     Existing PASRR number confirmed on       FL2 transmitted to all facilities in geographic area requested by pt/family on 10/02/14     FL2 transmitted to all facilities within larger geographic area on       Patient informed that his/her managed care company has contracts with or will negotiate with certain facilities, including the following:        Yes   Patient/family informed of bed offers received.  Patient chooses bed at [facility] (Second choice is [facility])     Physician recommends and patient chooses bed at      Patient to be transferred to [facility] on  .  Patient to be transferred to facility by       Patient family notified on   of transfer.  Name of family member notified:        PHYSICIAN       Additional Comment:  10/10/14: Family contact is daughter  Dory Horn: [phone]. _______________________________________________ Cristobal Goldmann, LCSW 10/10/2014, 1:17 PM

## 2014-10-10 NOTE — Interval H&P Note (Signed)
History and Physical Interval Note:  10/10/2014 9:01 AM  [name]  has presented today for surgery, with the diagnosis of End Stage Renal Disease N18.6  The various methods of treatment have been discussed with the patient and family. After consideration of risks, benefits and other options for treatment, the patient has consented to  Procedure(s): ARTERIOVENOUS (AV) FISTULA CREATION (Left) as a surgical intervention .  The patient's history has been reviewed, patient examined, no change in status, stable for surgery.  I have reviewed the patient's chart and labs.  Questions were answered to the patient's satisfaction.     Gretta Began

## 2014-10-10 NOTE — H&P (View-Only) (Signed)
  Progress Note    10/09/2014 2:35 PM 3 Days Post-Op  Subjective:  C/o pain  Afebrile   Filed Vitals:   10/09/14 1143  BP: 145/72  Pulse: 65  Temp: 98 F (36.7 C)  Resp: 21    Physical Exam: Incisions:  C/d/i with staples in tact Extremities:  Good ROM  CBC    Component Value Date/Time   WBC 6.3 10/09/2014 0500   RBC 3.21* 10/09/2014 0500   HGB 9.1* 10/09/2014 0500   HCT 27.3* 10/09/2014 0500   PLT 136* 10/09/2014 0500   MCV 85.0 10/09/2014 0500   MCH 28.3 10/09/2014 0500   MCHC 33.3 10/09/2014 0500   RDW 15.6* 10/09/2014 0500   LYMPHSABS 0.4* 10/08/2014 0901   MONOABS 0.7 10/08/2014 0901   EOSABS 0.1 10/08/2014 0901   BASOSABS 0.0 10/08/2014 0901    BMET    Component Value Date/Time   NA 131* 10/09/2014 0826   K 3.6 10/09/2014 0826   CL 92* 10/09/2014 0826   CO2 22 10/09/2014 0826   GLUCOSE 121* 10/09/2014 0826   BUN 39* 10/09/2014 0826   CREATININE 4.55* 10/09/2014 0826   CALCIUM 8.3* 10/09/2014 0826   GFRNONAA 14* 10/09/2014 0826   GFRAA 16* 10/09/2014 0826    INR    Component Value Date/Time   INR 1.37 10/06/2014 0520     Intake/Output Summary (Last 24 hours) at 10/09/14 1435 Last data filed at 10/09/14 1143  Gross per 24 hour  Intake   1488 ml  Output   3705 ml  Net  -2217 ml     Assessment/Plan:  49 y.o. male is s/p right above knee amputation  3 Days Post-Op  -stump is viable -good ROM -continue pain control -continue dressing changes daily and placement of retention sock. -po vancomycin started for C diff   Samantha Rhyne, PA-C [facility] [phone] 10/09/2014 2:35 PM            

## 2014-10-10 NOTE — Progress Notes (Addendum)
Admit: 09/24/2014 LOS: 16  83M new ESRD, PAD with AKA RLE 10/06/14  Subjective:  S/p L BC AVF VVS Early this AM HD yesterday -- 3.5h 3.5L UF  06/16 0701 - 06/17 0700 In: 1512 [P.O.:840; I.V.:672] Out: 3505   Filed Weights   10/09/14 0809 10/09/14 1143 10/10/14 0500  Weight: 74 kg (163 lb 2.3 oz) 70.1 kg (154 lb 8.7 oz) 70 kg (154 lb 5.2 oz)    Scheduled Meds: . amiodarone  400 mg Oral BID  . calcium acetate  1,334 mg Oral TID WC  . darbepoetin (ARANESP) injection - DIALYSIS  100 mcg Intravenous Q Tue-HD  . feeding supplement (NEPRO CARB STEADY)  237 mL Oral BID BM  . feeding supplement (PRO-STAT SUGAR FREE 64)  30 mL Oral BID  . folic acid  1 mg Oral Daily  . insulin aspart  0-9 Units Subcutaneous TID WC  . metoprolol tartrate  25 mg Oral BID  . multivitamin  1 tablet Oral QHS  . pantoprazole  40 mg Oral Q1200  . patient's guide to using coumadin book   Does not apply Once  . polyethylene glycol  17 g Oral BID  . senna-docusate  1 tablet Oral BID  . sodium chloride  3 mL Intravenous Q12H  . thiamine  100 mg Oral Daily  . vancomycin  125 mg Oral QID  . warfarin  5 mg Oral ONCE-1800  . warfarin   Does not apply Once  . Warfarin - Pharmacist Dosing Inpatient   Does not apply q1800   Continuous Infusions: . sodium chloride 25 mL/hr (10/10/14 0851)  . sodium chloride    . heparin     PRN Meds:.acetaminophen, morphine injection, nitroGLYCERIN, ondansetron (ZOFRAN) IV, oxyCODONE  Current Labs: reviewed    Physical Exam:  Blood pressure 133/65, pulse 64, temperature 98 F (36.7 C), temperature source Oral, resp. rate 18, height 5\' 11"  (1.803 m), weight 70 kg (154 lb 5.2 oz), SpO2 100 %. NAD Multiple tattoos RRR No Edema EOMI Poor dentition  A/P 1. New ESRD 1. Started 6/5 2. Cont on THS Schedule 3. S/p L BC AVF 6/17 VVS Early 4. CLIP in process; will need to coordinate with ?SNF vs CIR 5. 4h, on systemic heparin, hold bolus heparin post OR 2. HTN/Vol 1. Post  weight 69kg next Tx 2. BP stable only on MTP 3. Anemia 1. Aranesp 100 qTues 2. Rec Feraheme as well 4. PAD and ischemic RLE, 1. 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 CKD-BMD 1. PTH = 97 on 09/29/14 2. Phos up, added PhosLo to meals 3. Follow 6. AMS, improved 7. C Diff colitis: PO Vanc 8. Episode of VT req cardioversion 6/6, on amio 9. AFib 10. Systolic HF: cont to lower EDW 11. CAD s/p CABG  Sabra Heck MD 10/10/2014, 11:49 AM   Recent Labs Lab 10/06/14 0520  10/08/14 0002 10/08/14 0901 10/09/14 0826 10/10/14 0507  NA 134*  < > 132* 132* 131* 134*  K 4.1  < > 4.2 3.7 3.6 4.0  CL 98*  < > 94* 96* 92* 98*  CO2 21*  < > 21* 21* 22 24  GLUCOSE 167*  < > 138* 136* 121* 157*  BUN 52*  < > 67* 31* 39* 25*  CREATININE 5.00*  < > 6.12* 3.66* 4.55* 3.61*  CALCIUM 8.2*  < > 8.3* 8.3* 8.3* 8.5*  PHOS 5.1*  --  6.7*  --  5.6*  --   < > = values in this  interval not displayed.  Recent Labs Lab 10/08/14 0901 10/09/14 0500 10/10/14 0507  WBC 6.5 6.3 7.1  NEUTROABS 5.2  --   --   HGB 10.1* 9.1* 9.5*  HCT 30.7* 27.3* 29.1*  MCV 85.3 85.0 87.1  PLT 128* 136* 139*

## 2014-10-10 NOTE — Progress Notes (Signed)
Nutrition Follow-up  DOCUMENTATION CODES:  Severe malnutrition in context of chronic illness  INTERVENTION:  Nepro shake, Prostat  NUTRITION DIAGNOSIS:  Malnutrition related to chronic illness as evidenced by severe depletion of body fat, severe depletion of muscle mass.  ongoing  GOAL:  Patient will meet greater than or equal to 90% of their needs  Met on some days  MONITOR:  PO intake, Supplement acceptance, Skin, I & O's, Weight trends, Labs  REASON FOR ASSESSMENT:  Consult Diet education  ASSESSMENT: Hx of uncontrolled DM, CHF, severe PAD, CAD s/p CABG, CKD IV, and ETOH abuse admitted with right leg pain/sweeling.   Patient started on HD 6/5. S/P right AKA on 6/13.   Received consult for Renal diet education for pt's daughter. Pt states there is no way she would have asked for that, it was more likely his sister, who was not with the pt at time of visit. Pt's other sister was present who asked to proceed with education. Provided pt with the handout "Nutrition Therapy for People with Chronic Kidney Disease Stage 5". Walked pt through the handout, explaining the importance of avoiding sodium, potassium and phosphorus. Pt states this diet is terrible, he hates it and it will be hard to follow, if he can only eat what is provided for him here. Explained to pt how important it is to follow this diet if he is to get better. Pt's diet history shows high intake of fast foods. Pt even asked how [employer] fits into his diet, did not appear to take it seriously. Sister stated that they will work on him. Teach back method used, pt verbalized understanding, expect fair compliance. His current intake is very variable 25 - 100%, does not take all his supplements, some due to dialysis. Will continue to monitor. Labs reviewed: Na 134, Glu 121 - 157, BUN 25, Cr 3.61, Ca 8.5     Height:  Ht Readings from Last 1 Encounters:  10/06/14 5' 11" (1.803 m)    Weight:  Wt Readings from Last 1  Encounters:  10/10/14 154 lb 5.2 oz (70 kg)    Ideal Body Weight:  72 kg  Wt Readings from Last 10 Encounters:  10/10/14 154 lb 5.2 oz (70 kg)  08/07/14 194 lb 11.2 oz (88.315 kg)  02/10/14 200 lb 9.6 oz (90.992 kg)  11/07/13 185 lb 9.6 oz (84.188 kg)  10/01/13 189 lb (85.73 kg)  05/09/13 196 lb 3.2 oz (88.996 kg)  11/19/12 201 lb 9.6 oz (91.445 kg)  03/23/11 202 lb 2.6 oz (91.7 kg)    BMI:  Body mass index is 21.53 kg/(m^2).  Estimated Nutritional Needs:  Kcal:  2100 - 2300  Protein:  100 - 115 g  Fluid:  per MD  Skin:  Wound (see comment) (stage 2 pressure ulcer to sacrum; right AKA site)  Diet Order:  Diet renal with fluid restriction Fluid restriction:: 1200 mL Fluid; Room service appropriate?: Yes; Fluid consistency:: Thin  EDUCATION NEEDS:  Education needs no appropriate at this time   Intake/Output Summary (Last 24 hours) at 10/10/14 1459 Last data filed at 10/10/14 0600  Gross per 24 hour  Intake   1272 ml  Output      0 ml  Net   1272 ml    Last BM:  6/16  Shirline Kendle A. [facility] Dietetic Intern Pager: [phone] 10/10/2014 3:13 PM

## 2014-10-10 NOTE — Progress Notes (Signed)
[facility] PROGRESS NOTE  [name] FHL:[id number] DOB: [birth year] DOA: 09/24/2014 PCP: [name], NP  Assessment/Plan:  Metabolic Encephalopathy Patient presented with metabolic encephalopathy likely from uremia worsening cardiac function and sedating medications. At this time the metabolic insult. As this time it is resolved and patient is at baseline.   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   Episode of monomorphic VT / wide complex tachycardia  Patient had episode of wide complex tachycardia in the hospital which required cardioversion on 09/29/2014. Currently patient is on oral amiodarone as recommended by the EP. We'll continue to monitor the patient on telemetry   Acute onset Afib 6/6 Patient acute onset A. fib, chads 2vasc  score is 3, patient is currently on IV heparin. Will start Coumadin today.   PAD - R LE ischemia Now s/p R AKA - Vasc Surgery following -  Will discontinue morphine and start Oxycodone 10 mg po q 4 hr prn Will also start Oxycontin 15 mg po q 12 hr for long acting pain relief  Bilateral lower extremity cellulitis w/ sepsis due to unspecified organism Patient presented with bilateral lower extremity cellulitis and was started on IV antibiotic switch were discontinued yesterday. Patient continues to be afebrile.   Jaundice  total bilirubin fluctuating - acute Hepatitis panel negative - RPR negative - anti-nuclear antibody negative - HIV negative -Hepatic ultrasound reveals normal appearing parenchyma with ascites (likely due to ESRD)   CAD s/p CABG 2010 Chronic Systolic CHF / Cardiomyopathy  -TTE 02/21/14 showed EF 20-25% with grade 3 diastolic dysfunction.  -seen by Dr. Royann Shivers on 08/07/14 w/ decision regarding cardiac device therapy depending heavily on his decision regarding dialysis - ICD was to be  considered if he started dialysis. -6/3 TTE shows cardiomyopathy with worsening EF now at 20%  -EP does not feel he is presently a candidate for aggressive procedures -Volume management per hemodialysis  HTN -Blood pressures currently reasonably controlled   Hyperlipidemia  -lipid panel abnormal  DM type-II  -6/3 A1c 6.4 - CBG currently controlled   Code Status: Full code Family Communication: No family at bedside Disposition Plan: Skilled nursing facility   Consultants: Nephrology Vasc Surgery EP  Procedures:  Echocardiogram- EF 20%, mild LVH  Antibiotics: Vancomycin 6 /1 > 6/15 Zosyn 6/1 > 6/15  HPI/Subjective: 49 y.o. M Hx hypertension, hyperlipidemia, diabetes mellitus, GERD, CAD S/P CABG 2010, combined systolic and diastolic congestive heart failure (EF 20-25% with grade 3 diastolic dysfunction), chronic kidney disease stage IV, severe PAD, and substance abuse who presented with right leg swelling and pain for several days. His legs became red from foot to upper thigh medially. He did not have fever or chills. Patient was unable to ambulate secondary to pain.  Patient came back from left AV fistula procedure. Says the morphine not helping much, wants to try oxycodone.  Objective: Filed Vitals:   10/10/14 1125  BP: 133/65  Pulse: 64  Temp: 98 F (36.7 C)  Resp: 18    Intake/Output Summary (Last 24 hours) at 10/10/14 1423 Last data filed at 10/10/14 0600  Gross per 24 hour  Intake   1272 ml  Output      0 ml  Net   1272 ml   Filed Weights   10/09/14 0809 10/09/14 1143 10/10/14 0500  Weight: 74 kg (163 lb 2.3 oz) 70.1 kg (154 lb 8.7 oz) 70  kg (154 lb 5.2 oz)    Exam:   General:  Appears in no acute distress  Cardiovascular: S1-S2 is regular, no murmurs rubs or gallops  Respiratory: Clear to auscultation bilaterally, no wheezing or crackles  Abdomen: Soft, nontender, no organomegaly  Musculoskeletal: Status post right AKA, right stump in  dressing   Data Reviewed: Basic Metabolic Panel:  Recent Labs Lab 10/06/14 0520 10/07/14 0540 10/08/14 0002 10/08/14 0901 10/09/14 0826 10/10/14 0507  NA 134* 132* 132* 132* 131* 134*  K 4.1 4.3 4.2 3.7 3.6 4.0  CL 98* 94* 94* 96* 92* 98*  CO2 21* 19* 21* 21* 22 24  GLUCOSE 167* 125* 138* 136* 121* 157*  BUN 52* 60* 67* 31* 39* 25*  CREATININE 5.00* 5.99* 6.12* 3.66* 4.55* 3.61*  CALCIUM 8.2* 8.2* 8.3* 8.3* 8.3* 8.5*  MG  --   --   --  1.8  --   --   PHOS 5.1*  --  6.7*  --  5.6*  --    Liver Function Tests:  Recent Labs Lab 10/04/14 0335 10/05/14 0248 10/06/14 0520 10/08/14 0002 10/08/14 0901 10/09/14 0826  AST 20 7*  --   --  19  --   ALT 13* 11*  --   --  13*  --   ALKPHOS 92 107  --   --  95  --   BILITOT 2.5* 2.3*  --   --  2.6*  --   PROT 6.2* 6.2*  --   --  5.7*  --   ALBUMIN 2.2* 2.3* 2.1* 1.9* 2.1* 2.0*    Recent Labs Lab 10/04/14 0335  LIPASE 119*   No results for input(s): AMMONIA in the last 168 hours. CBC:  Recent Labs Lab 10/06/14 0520 10/08/14 0010 10/08/14 0901 10/09/14 0500 10/10/14 0507  WBC 7.0 8.9 6.5 6.3 7.1  NEUTROABS  --   --  5.2  --   --   HGB 9.9* 9.5* 10.1* 9.1* 9.5*  HCT 29.9* 28.9* 30.7* 27.3* 29.1*  MCV 86.4 84.8 85.3 85.0 87.1  PLT 123* 150 128* 136* 139*   Cardiac Enzymes: No results for input(s): CKTOTAL, CKMB, CKMBINDEX, TROPONINI in the last 168 hours. BNP (last 3 results)  Recent Labs  09/25/14 0410  BNP 2227.0*    ProBNP (last 3 results) No results for input(s): PROBNP in the last 8760 hours.  CBG:  Recent Labs Lab 10/09/14 1704 10/09/14 2255 10/10/14 0743 10/10/14 1048 10/10/14 1233  GLUCAP 140* 116* 149* 124* 128*    Recent Results (from the past 240 hour(s))  Clostridium Difficile by PCR (not at [facility])     Status: Abnormal   Collection Time: 10/09/14  4:33 AM  Result Value Ref Range Status   C difficile by pcr POSITIVE (A) NEGATIVE Final    Comment: CRITICAL RESULT CALLED TO, READ BACK  BY AND VERIFIED WITH: RN HAYWOOD,K AT [id number] [name]      Studies: No results found.  Scheduled Meds: . amiodarone  400 mg Oral BID  . calcium acetate  1,334 mg Oral TID WC  . darbepoetin (ARANESP) injection - DIALYSIS  100 mcg Intravenous Q Tue-HD  . feeding supplement (NEPRO CARB STEADY)  237 mL Oral BID BM  . feeding supplement (PRO-STAT SUGAR FREE 64)  30 mL Oral BID  . folic acid  1 mg Oral Daily  . insulin aspart  0-9 Units Subcutaneous TID WC  . metoprolol tartrate  25 mg Oral BID  . multivitamin  1 tablet Oral QHS  . pantoprazole  40 mg Oral Q1200  . patient's guide to using coumadin book   Does not apply Once  . polyethylene glycol  17 g Oral BID  . senna-docusate  1 tablet Oral BID  . sodium chloride  3 mL Intravenous Q12H  . thiamine  100 mg Oral Daily  . vancomycin  125 mg Oral QID  . warfarin  5 mg Oral ONCE-1800  . warfarin   Does not apply Once  . Warfarin - Pharmacist Dosing Inpatient   Does not apply q1800   Continuous Infusions: . sodium chloride 25 mL/hr (10/10/14 0851)  . sodium chloride    . heparin      Principal Problem:   Bilateral lower leg cellulitis Active Problems:   S/P CABG x 3:  March 2010: left internal mammary artery to distal left anterior descending, SVG to Circ marginal vessel, SVG to PDA   HTN (hypertension)   Hyperlipidemia   DM2 (diabetes mellitus, type 2)   Acute renal failure superimposed on stage 4 chronic kidney disease   CAD (coronary artery disease)   NSTEMI (non-ST elevated myocardial infarction)   PAD (peripheral artery disease)   GERD (gastroesophageal reflux disease)   Combined systolic and diastolic congestive heart failure   Cellulitis of right leg   Sepsis   Cellulitis   Constipation   Blood poisoning   Jaundice   Peripheral artery disease   CAD in native artery   Essential hypertension   HLD (hyperlipidemia)   Diabetes type 2, uncontrolled   Acute on chronic renal failure   Chronic combined  systolic and diastolic CHF (congestive heart failure)   Metabolic encephalopathy   Cellulitis of leg, right   Cellulitis of leg, left   Chronic systolic heart failure   Cardiomyopathy   Hypokalemia   Systolic CHF, chronic   Chronic systolic CHF (congestive heart failure)   Diabetes type 2, controlled   Protein-calorie malnutrition, severe   Sepsis affecting skin   Gangrene of foot   Pressure ulcer   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 If 7PM-7AM, please contact night-coverage at [email or web address], password [facility] 10/10/2014, 2:23 PM  LOS: 16 days

## 2014-10-10 NOTE — Transfer of Care (Signed)
Immediate Anesthesia Transfer of Care Note  Patient: [name]  Procedure(s) Performed: Procedure(s): LEFT BRACHIAL TO CEPHALIC ARTERIOVENOUS (AV) FISTULA CREATION (Left)  Patient Location: PACU  Anesthesia Type:MAC  Level of Consciousness: awake, alert , oriented and sedated  Airway & Oxygen Therapy: Patient Spontanous Breathing and Patient connected to nasal cannula oxygen  Post-op Assessment: Report given to RN, Post -op Vital signs reviewed and stable and Patient moving all extremities  Post vital signs: Reviewed and stable  Last Vitals:  Filed Vitals:   10/10/14 1055  BP: 130/61  Pulse: 58  Temp: 36.3 C  Resp: 19    Complications: No apparent anesthesia complications

## 2014-10-10 NOTE — Op Note (Signed)
    OPERATIVE REPORT  DATE OF SURGERY: 10/10/2014  PATIENT: [name], 49 y.o. male MRN: [id number]  DOB: 08/15/1965  PRE-OPERATIVE DIAGNOSIS: End-stage renal disease  POST-OPERATIVE DIAGNOSIS:  Same  PROCEDURE: Left brachiocephalic AV fistula  SURGEON:  Gretta Began, M.D.  PHYSICIAN ASSISTANT: Collins PA-C  ANESTHESIA:  Local with sedation  EBL: Minimal ml     BLOOD ADMINISTERED: None  DRAINS: None  SPECIMEN: None  COUNTS CORRECT:  YES  PLAN OF CARE: PACU   PATIENT DISPOSITION:  PACU - hemodynamically stable  PROCEDURE DETAILS: Patient was taken to the operative placed supine position where the area of the left arm prepped draped in sterile fashion. SonoSite ultrasound was used to visualize the vein. The patient had a good caliber cephalic vein that was patent throughout the upper arm. Using local anesthesia incision was made at the transversely at the antecubital space ^ M isolate the cephalic vein which was of good caliber brachial artery which was also of good caliber. Patient had the branching of the cephalic vein at the antecubital space distally. The distal branches were ligated and divided giving mobilization of the cephalic vein to the level of the brachial artery. The artery was occluded proximally and distally was opened with 11 blade incision longitudinally with Potts scissors. Was no change in the vein was cut to appropriate length and was spatulated and sewn end-to-side to the brachial artery with a running 60 proline suture. Clamps removed and excellent thrill was noted. Wounds irrigated with saline. Hemostasis tablet cautery. Wounds were closed with 30 Vicryls in the subcutaneous and subcuticular tissue. Benzoin and Steri-Strips were applied. The patient was transferred to the recovery room stable condition   Gretta Began, M.D. 10/10/2014 10:41 AM

## 2014-10-10 NOTE — Progress Notes (Signed)
ANTICOAGULATION CONSULT NOTE - Follow Up Consult  Pharmacy Consult for heparin and coumadin Indication: atrial fibrillation  No Known Allergies  Patient Measurements: Height: 5\' 11"  (180.3 cm) Weight: 154 lb 5.2 oz (70 kg) IBW/kg (Calculated) : 75.3   Vital Signs: Temp: 98 F (36.7 C) (06/17 1125) Temp Source: Oral (06/17 1125) BP: 133/65 mmHg (06/17 1125) Pulse Rate: 64 (06/17 1125)  Labs:  Recent Labs  10/08/14 0901 10/09/14 0500 10/09/14 0826 10/09/14 1049 10/10/14 0507  HGB 10.1* 9.1*  --   --  9.5*  HCT 30.7* 27.3*  --   --  29.1*  PLT 128* 136*  --   --  139*  LABPROT  --   --   --   --  15.6*  INR  --   --   --   --  1.22  HEPARINUNFRC 0.52 >2.20*  --  0.31 0.47  CREATININE 3.66*  --  4.55*  --  3.61*    Estimated Creatinine Clearance: 24.2 mL/min (by C-G formula based on Cr of 3.61).    Assessment: Pharmacy dosing heparin for new onset afib.  AM heparin level therapeutic at 0.47 on 1800 units/hr.  Now s/p AV fistula placement, heparin to be resumed at 1430.   H/H 9.5/29.1, plct low 139, but upward trend.  No overt s/sx bleeding reported.  To start coumadin today now that AV fistula placed.  On amio 400 mg bid which will increase INR.    Goal of Therapy:  Heparin level 0.3-0.7 units/ml Monitor platelets by anticoagulation protocol: Yes   Plan: Resume heparin gtt at 1800 units/hr at 1430 Coumadin day #1 Coum 5 mg x1 Book/video ordered Daily INR, HL, CBC  Herby Abraham, Pharm.D. [phone] 10/10/2014 11:50 AM

## 2014-10-10 NOTE — Progress Notes (Signed)
Pt has mother and sister at bedside. Educated family that pt is currently on enteric precautions for infectious c-diff. RN educated family on how contagious infection is and that they will need to wear proper PPE (Gown and gloves) and to wash hands with soap and water prior to exiting the room. Family refused to wear PPE and are currently eating in pt's room. RN provided further education and family and pt verbalized understanding. Family stated they will wash hand thoroughly before leaving.   Jonell Cluck, RN

## 2014-10-11 LAB — CBC
HCT: 26.2 % — ABNORMAL LOW (ref 39.0–52.0)
HEMOGLOBIN: 8.5 g/dL — AB (ref 13.0–17.0)
MCH: 28.1 pg (ref 26.0–34.0)
MCHC: 32.4 g/dL (ref 30.0–36.0)
MCV: 86.8 fL (ref 78.0–100.0)
Platelets: 128 10*3/uL — ABNORMAL LOW (ref 150–400)
RBC: 3.02 MIL/uL — ABNORMAL LOW (ref 4.22–5.81)
RDW: 15.8 % — ABNORMAL HIGH (ref 11.5–15.5)
WBC: 6.9 10*3/uL (ref 4.0–10.5)

## 2014-10-11 LAB — BASIC METABOLIC PANEL
ANION GAP: 12 (ref 5–15)
BUN: 36 mg/dL — ABNORMAL HIGH (ref 6–20)
CHLORIDE: 97 mmol/L — AB (ref 101–111)
CO2: 23 mmol/L (ref 22–32)
Calcium: 8.5 mg/dL — ABNORMAL LOW (ref 8.9–10.3)
Creatinine, Ser: 5 mg/dL — ABNORMAL HIGH (ref 0.61–1.24)
GFR calc Af Amer: 14 mL/min — ABNORMAL LOW (ref >60)
GFR calc non Af Amer: 12 mL/min — ABNORMAL LOW (ref >60)
Glucose, Bld: 175 mg/dL — ABNORMAL HIGH (ref 65–99)
Potassium: 4.2 mmol/L (ref 3.5–5.1)
SODIUM: 132 mmol/L — AB (ref 135–145)

## 2014-10-11 LAB — GLUCOSE, CAPILLARY
GLUCOSE-CAPILLARY: 120 mg/dL — AB (ref 65–99)
GLUCOSE-CAPILLARY: 143 mg/dL — AB (ref 65–99)
Glucose-Capillary: 130 mg/dL — ABNORMAL HIGH (ref 65–99)
Glucose-Capillary: 123 mg/dL — ABNORMAL HIGH (ref 65–99)

## 2014-10-11 LAB — HEPARIN LEVEL (UNFRACTIONATED): Heparin Unfractionated: 0.4 IU/mL (ref 0.30–0.70)

## 2014-10-11 LAB — PROTIME-INR
INR: 1.25 (ref 0.00–1.49)
Prothrombin Time: 15.9 seconds — ABNORMAL HIGH (ref 11.6–15.2)

## 2014-10-11 MED ORDER — OXYCODONE HCL 5 MG PO TABS
ORAL_TABLET | ORAL | Status: AC
  Filled 2014-10-11: qty 2

## 2014-10-11 MED ORDER — WARFARIN SODIUM 5 MG PO TABS
5.0000 mg | ORAL_TABLET | Freq: Once | ORAL | Status: AC
  Administered 2014-10-11: 5 mg via ORAL
  Filled 2014-10-11: qty 1

## 2014-10-11 NOTE — Progress Notes (Signed)
Physical Therapy Treatment Patient Details Name: Narender Peduzzi MRN: [id number] DOB: [birth year] Today's Date: 10/11/2014    History of Present Illness Pt admitted with RLE cellulitis. PMHx of HTN, HLD, DM, GERD, CHF, CKD, polysubstance abuse. 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    PT Comments    Patient progressing slowly somewhat due to fear, but participated well with edge of bed activities.  Also rewrapped residual limb, but coming off again near end of session.  Needs new shrinker or assist to clean one in the room.  Will benefit from SNF rehab at d/c.  Follow Up Recommendations  SNF     Equipment Recommendations  None recommended by PT    Recommendations for Other Services       Precautions / Restrictions Precautions Precautions: Fall    Mobility  Bed Mobility Overal bed mobility: Needs Assistance   Rolling: Supervision   Supine to sit: Min assist Sit to supine: Min assist   General bed mobility comments: assist to lift trunk, cues and increased time to scoot to edge of bed  Transfers Overall transfer level: Needs assistance Equipment used:  (for sit to squat hands on arms of chair in front ot pt) Transfers: Squat Pivot Transfers     Squat pivot transfers: Mod assist;Max assist     General transfer comment: patient attempted squat pivot transfer with assist, but was fearful and not lifting hips off bed despite increased assist.  So moved chair to front of pt and elevated height of bed and pt placed hands on arms of chair performed sit to squat x 3 mod assist for strengthening of left leg/  Ambulation/Gait                 Stairs            Wheelchair Mobility    Modified Rankin (Stroke Patients Only)       Balance   Sitting-balance support: Single extremity supported Sitting balance-Leahy Scale: Fair         Standing balance comment: did not stand today                    Cognition Arousal/Alertness: Awake/alert Behavior During Therapy:  Anxious Overall Cognitive Status: Within Functional Limits for tasks assessed                      Exercises Amputee Exercises Hip Flexion/Marching: AROM;10 reps;Supine;Right    General Comments General comments (skin integrity, edema, etc.): after return to supine pt rolled and scooted and performed bridging with left leg for repositioning several times.  Educated important to continue to use this exercise for strengthening of this leg.      Pertinent Vitals/Pain Pain Score: 10-Worst pain ever Pain Location: right leg Pain Descriptors / Indicators: Constant;Aching Pain Intervention(s): Monitored during session;Patient requesting pain meds-RN notified    Home Living                      Prior Function            PT Goals (current goals can now be found in the care plan section) Progress towards PT goals: Progressing toward goals (slow)    Frequency  Min 3X/week    PT Plan Discharge plan needs to be updated    Co-evaluation             End of Session Equipment Utilized During Treatment: Gait belt Activity Tolerance: Patient limited by pain Patient left: in  bed;with call bell/phone within reach     Time: 1205-1230 PT Time Calculation (min) (ACUTE ONLY): 25 min  Charges:  $Therapeutic Activity: 23-37 mins                    G Codes:      WYNN,CYNDI 10-14-14, 12:51 PM  Sheran Lawless, PT [phone] 14-Oct-2014

## 2014-10-11 NOTE — Progress Notes (Addendum)
[facility]  Subjective  - No new complaints.   Objective 130/60 64 98 F (36.7 C) (Oral) 16 99%  Intake/Output Summary (Last 24 hours) at 10/11/14 0733 Last data filed at 10/11/14 0425  Gross per 24 hour  Intake    910 ml  Output      0 ml  Net    910 ml    Right AKA site incision healing well without erythema, no active drainage Left AV fistula with palpable thrill, incision clean steri strips in place   Assessment/Planning: POD # 1 Left AV fistula Brachial/cephalic with good palpable thrill will be ready to use in 12 weeks POD#  5 Right AKA   Keep right stump clean and dry, sock has urine present and staff was asked to clean it or get a new one. Plan for him to follow up in our office for Right AKA staple removal in 4 weeks and check on left BC fistula. Call us if further concerns this hospitalization.  Clinton Gallant [facility] 10/11/2014 7:33 AM --  Laboratory Lab Results:  Recent Labs  10/09/14 0500 10/10/14 0507  WBC 6.3 7.1  HGB 9.1* 9.5*  HCT 27.3* 29.1*  PLT 136* 139*   BMET  Recent Labs  10/09/14 0826 10/10/14 0507  NA 131* 134*  K 3.6 4.0  CL 92* 98*  CO2 22 24  GLUCOSE 121* 157*  BUN 39* 25*  CREATININE 4.55* 3.61*  CALCIUM 8.3* 8.5*    COAG Lab Results  Component Value Date   INR 1.22 10/10/2014   INR 1.37 10/06/2014   INR 1.34 10/01/2014   No results found for: PTT

## 2014-10-11 NOTE — Progress Notes (Signed)
Admit: 09/24/2014 LOS: 17  77M new ESRD, PAD with AKA RLE 10/06/14  Subjective:  No new events  06/17 0701 - 06/18 0700 In: 1230 [P.O.:822; I.V.:408] Out: -   Filed Weights   10/09/14 1143 10/10/14 0500 10/10/14 2100  Weight: 70.1 kg (154 lb 8.7 oz) 70 kg (154 lb 5.2 oz) 71.3 kg (157 lb 3 oz)    Scheduled Meds: . amiodarone  400 mg Oral BID  . calcium acetate  1,334 mg Oral TID WC  . darbepoetin (ARANESP) injection - DIALYSIS  100 mcg Intravenous Q Tue-HD  . feeding supplement (NEPRO CARB STEADY)  237 mL Oral BID BM  . feeding supplement (PRO-STAT SUGAR FREE 64)  30 mL Oral BID  . folic acid  1 mg Oral Daily  . insulin aspart  0-9 Units Subcutaneous TID WC  . metoprolol tartrate  25 mg Oral BID  . multivitamin  1 tablet Oral QHS  . OxyCODONE  15 mg Oral Q12H  . pantoprazole  40 mg Oral Q1200  . polyethylene glycol  17 g Oral BID  . senna-docusate  1 tablet Oral BID  . sodium chloride  3 mL Intravenous Q12H  . thiamine  100 mg Oral Daily  . vancomycin  125 mg Oral QID  . Warfarin - Pharmacist Dosing Inpatient   Does not apply q1800   Continuous Infusions: . sodium chloride 25 mL/hr (10/10/14 0851)  . sodium chloride    . heparin 1,800 Units/hr (10/11/14 0409)   PRN Meds:.acetaminophen, nitroGLYCERIN, ondansetron (ZOFRAN) IV, oxyCODONE  Current Labs: reviewed    Physical Exam:  Blood pressure 130/60, pulse 64, temperature 98 F (36.7 C), temperature source Oral, resp. rate 16, height  (1.803 m), weight 71.3 kg (157 lb 3 oz), SpO2 99 %. NAD Multiple tattoos RRR No Edema EOMI Poor dentition  A/P 1. New ESRD 1. Started 6/5 2. Cont on THS Schedule 3. S/p L BC AVF 6/17 VVS Early 4. CLIP in process; now that SNF, will need to finalize  5. 4h, on systemic heparin, hold bolus heparin post OR 2. HTN/Vol 1. Post weight 69kg next Tx 2. BP stable only on MTP 3. Anemia 1. Aranesp 100 qTues 2. Rec Feraheme as well 4. PAD and ischemic RLE, 1. S/p AKA with VVS  6/13 5. CKD-BMD 1. PTH = 97 on 09/29/14 2. Phos up, added PhosLo to meals 3. Follow 6. AMS, improved 7. C Diff colitis: PO Vanc 8. Episode of VT req cardioversion 6/6, on amio 9. AFib 10. Systolic HF: cont to lower EDW 11. CAD s/p CABG  Sabra Heck MD 10/11/2014, 8:28 AM   Recent Labs Lab 10/06/14 0520  10/08/14 0002 10/08/14 0901 10/09/14 0826 10/10/14 0507  NA 134*  < > 132* 132* 131* 134*  K 4.1  < > 4.2 3.7 3.6 4.0  CL 98*  < > 94* 96* 92* 98*  CO2 21*  < > 21* 21* 22 24  GLUCOSE 167*  < > 138* 136* 121* 157*  BUN 52*  < > 67* 31* 39* 25*  CREATININE 5.00*  < > 6.12* 3.66* 4.55* 3.61*  CALCIUM 8.2*  < > 8.3* 8.3* 8.3* 8.5*  PHOS 5.1*  --  6.7*  --  5.6*  --   < > = values in this interval not displayed.  Recent Labs Lab 10/08/14 0901 10/09/14 0500 10/10/14 0507  WBC 6.5 6.3 7.1  NEUTROABS 5.2  --   --   HGB 10.1* 9.1* 9.5*  HCT  30.7* 27.3* 29.1*  MCV 85.3 85.0 87.1  PLT 128* 136* 139*

## 2014-10-11 NOTE — Progress Notes (Signed)
ANTICOAGULATION CONSULT NOTE - Follow Up Consult  Pharmacy Consult for heparin and coumadin Indication: atrial fibrillation  No Known Allergies  Patient Measurements: Height: 5\' 11"  (180.3 cm) Weight: 163 lb 12.8 oz (74.3 kg) IBW/kg (Calculated) : 75.3   Vital Signs: Temp: 97.9 F (36.6 C) (06/18 1330) Temp Source: Oral (06/18 1330) BP: 131/51 mmHg (06/18 1430) Pulse Rate: 68 (06/18 1430)  Labs:  Recent Labs  10/09/14 0500 10/09/14 0826 10/09/14 1049 10/10/14 0507 10/11/14 1405 10/11/14 1408  HGB 9.1*  --   --  9.5*  --  8.5*  HCT 27.3*  --   --  29.1*  --  26.2*  PLT 136*  --   --  139*  --  128*  LABPROT  --   --   --  15.6*  --  15.9*  INR  --   --   --  1.22  --  1.25  HEPARINUNFRC >2.20*  --  0.31 0.47 0.40  --   CREATININE  --  4.55*  --  3.61*  --  5.00*    Estimated Creatinine Clearance: 18.6 mL/min (by C-G formula based on Cr of 5).  Assessment: Pharmacy dosing heparin for new onset afib.  Heparin level therapeutic at 0.40 on 1800 units/hr drawn in HD today.  Hgb 8.5 trending down from 9.5.  Plt 128 also trending down.  No overt s/sx bleeding reported.  Patient is also on coumadin now that AV fistula placed.  Of note, on amio 400 mg bid which will increase INR.    Goal of Therapy:  Heparin level 0.3-0.7 units/ml Monitor platelets by anticoagulation protocol: Yes   Plan:  - Continue heparin gtt at 1800 units/hr - Coumadin 5 mg x 1 tonight - Daily INR, HL, CBC - Monitor for signs and symptoms of bleeding  Red Christians, Pharm. D. Clinical Pharmacy Resident Pager: [phone] Ph: [phone] 10/11/2014 3:08 PM

## 2014-10-11 NOTE — Progress Notes (Signed)
Dressing change to right AKA; used 4x4, kerlex, and compression wrap. Removed retention stocking b/c is was soiled.

## 2014-10-12 DIAGNOSIS — E785 Hyperlipidemia, unspecified: Secondary | ICD-10-CM

## 2014-10-12 DIAGNOSIS — I2510 Atherosclerotic heart disease of native coronary artery without angina pectoris: Secondary | ICD-10-CM

## 2014-10-12 DIAGNOSIS — E118 Type 2 diabetes mellitus with unspecified complications: Secondary | ICD-10-CM

## 2014-10-12 LAB — GLUCOSE, CAPILLARY
GLUCOSE-CAPILLARY: 141 mg/dL — AB (ref 65–99)
GLUCOSE-CAPILLARY: 130 mg/dL — AB (ref 65–99)
Glucose-Capillary: 127 mg/dL — ABNORMAL HIGH (ref 65–99)
Glucose-Capillary: 117 mg/dL — ABNORMAL HIGH (ref 65–99)

## 2014-10-12 LAB — CBC
HEMATOCRIT: 25.9 % — AB (ref 39.0–52.0)
HEMOGLOBIN: 8.3 g/dL — AB (ref 13.0–17.0)
MCH: 28.1 pg (ref 26.0–34.0)
MCHC: 32 g/dL (ref 30.0–36.0)
MCV: 87.8 fL (ref 78.0–100.0)
PLATELETS: 121 10*3/uL — AB (ref 150–400)
RBC: 2.95 MIL/uL — AB (ref 4.22–5.81)
RDW: 15.7 % — ABNORMAL HIGH (ref 11.5–15.5)
WBC: 7.3 10*3/uL (ref 4.0–10.5)

## 2014-10-12 LAB — PROTIME-INR
INR: 1.28 (ref 0.00–1.49)
PROTHROMBIN TIME: 16.1 s — AB (ref 11.6–15.2)

## 2014-10-12 LAB — HEPARIN LEVEL (UNFRACTIONATED)
Heparin Unfractionated: 0.15 IU/mL — ABNORMAL LOW (ref 0.30–0.70)
Heparin Unfractionated: 0.23 IU/mL — ABNORMAL LOW (ref 0.30–0.70)

## 2014-10-12 MED ORDER — HEPARIN (PORCINE) IN NACL 100-0.45 UNIT/ML-% IJ SOLN
1800.0000 [IU]/h | INTRAMUSCULAR | Status: DC
  Administered 2014-10-12: 2000 [IU]/h via INTRAVENOUS
  Administered 2014-10-13: 2150 [IU]/h via INTRAVENOUS
  Administered 2014-10-13: 2350 [IU]/h via INTRAVENOUS
  Administered 2014-10-14: 2000 [IU]/h via INTRAVENOUS
  Administered 2014-10-15 – 2014-10-16 (×3): 1800 [IU]/h via INTRAVENOUS
  Filled 2014-10-12 (×10): qty 250

## 2014-10-12 MED ORDER — WARFARIN SODIUM 7.5 MG PO TABS
7.5000 mg | ORAL_TABLET | Freq: Once | ORAL | Status: DC
  Filled 2014-10-12: qty 1

## 2014-10-12 MED ORDER — OXYCODONE HCL ER 10 MG PO T12A
20.0000 mg | EXTENDED_RELEASE_TABLET | Freq: Two times a day (BID) | ORAL | Status: DC
  Administered 2014-10-12 – 2014-10-18 (×12): 20 mg via ORAL
  Filled 2014-10-12 (×12): qty 2

## 2014-10-12 NOTE — Progress Notes (Signed)
Admit: 09/24/2014 LOS: 18  31M new ESRD, PAD with AKA RLE 10/06/14  Subjective:  No new events  06/18 0701 - 06/19 0700 In: 2016 [P.O.:1440; I.V.:576] Out: 3500   Filed Weights   10/11/14 1330 10/11/14 2100 10/12/14 0500  Weight: 74.3 kg (163 lb 12.8 oz) 72.2 kg (159 lb 2.8 oz) 72.2 kg (159 lb 2.8 oz)    Scheduled Meds: . amiodarone  400 mg Oral BID  . calcium acetate  1,334 mg Oral TID WC  . darbepoetin (ARANESP) injection - DIALYSIS  100 mcg Intravenous Q Tue-HD  . feeding supplement (NEPRO CARB STEADY)  237 mL Oral BID BM  . feeding supplement (PRO-STAT SUGAR FREE 64)  30 mL Oral BID  . folic acid  1 mg Oral Daily  . insulin aspart  0-9 Units Subcutaneous TID WC  . metoprolol tartrate  25 mg Oral BID  . multivitamin  1 tablet Oral QHS  . OxyCODONE  15 mg Oral Q12H  . pantoprazole  40 mg Oral Q1200  . polyethylene glycol  17 g Oral BID  . senna-docusate  1 tablet Oral BID  . sodium chloride  3 mL Intravenous Q12H  . thiamine  100 mg Oral Daily  . vancomycin  125 mg Oral QID  . Warfarin - Pharmacist Dosing Inpatient   Does not apply q1800   Continuous Infusions: . sodium chloride 25 mL/hr (10/10/14 0851)  . sodium chloride    . heparin 1,800 Units/hr (10/11/14 1739)   PRN Meds:.acetaminophen, nitroGLYCERIN, ondansetron (ZOFRAN) IV, oxyCODONE  Current Labs: reviewed    Physical Exam:  Blood pressure 120/64, pulse 69, temperature 98.7 F (37.1 C), temperature source Oral, resp. rate 20, height 5\' 11"  (1.803 m), weight 72.2 kg (159 lb 2.8 oz), SpO2 93 %. NAD Multiple tattoos RRR No Edema EOMI Poor dentition  A/P 1. New ESRD 1. Started 6/5 2. Cont on THS Schedule 3. S/p L BC AVF 6/17 VVS Early 4. CLIP in process; now that SNF, will need to finalize  5. 4h, on systemic heparin --> PO Warfarin, hold bolus heparin post OR 2. HTN/Vol 1. Post weight 70kg next Tx 2. BP stable only on MTP 3. Anemia 1. Aranesp 100 qTues 2. Rec Feraheme as well 3. Monitor 4. PAD  and ischemic RLE, 1. 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 CKD-BMD 1. PTH = 97 on 09/29/14 2. Phos up, added PhosLo to meals 3. Follow 6. AMS, improved 7. C Diff colitis: PO Vanc 8. Episode of VT req cardioversion 6/6, on amio 9. AFib on anticoagulation 10. Systolic HF: cont to lower EDW 11. CAD s/p CABG  [name] Heck MD 10/12/2014, 8:59 AM   Recent Labs Lab 10/06/14 0520  10/08/14 0002  10/09/14 0826 10/10/14 0507 10/11/14 1408  NA 134*  < > 132*  < > 131* 134* 132*  K 4.1  < > 4.2  < > 3.6 4.0 4.2  CL 98*  < > 94*  < > 92* 98* 97*  CO2 21*  < > 21*  < > 22 24 23   GLUCOSE 167*  < > 138*  < > 121* 157* 175*  BUN 52*  < > 67*  < > 39* 25* 36*  CREATININE 5.00*  < > 6.12*  < > 4.55* 3.61* 5.00*  CALCIUM 8.2*  < > 8.3*  < > 8.3* 8.5* 8.5*  PHOS 5.1*  --  6.7*  --  5.6*  --   --   < > = values in  this interval not displayed.  Recent Labs Lab 10/08/14 0901 10/09/14 0500 10/10/14 0507 10/11/14 1408  WBC 6.5 6.3 7.1 6.9  NEUTROABS 5.2  --   --   --   HGB 10.1* 9.1* 9.5* 8.5*  HCT 30.7* 27.3* 29.1* 26.2*  MCV 85.3 85.0 87.1 86.8  PLT 128* 136* 139* 128*

## 2014-10-12 NOTE — Progress Notes (Signed)
[facility] PROGRESS NOTE  Camp Kreger ULA:[id number] DOB: 1965/07/01 DOA: 09/24/2014 PCP: [name], NP  Assessment/Plan:  Metabolic Encephalopathy Patient presented with metabolic encephalopathy likely from uremia worsening cardiac function and sedating medications. At this time the metabolic insult. As this time it is resolved and patient is at baseline.  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  Episode of monomorphic VT / wide complex tachycardia  Patient had episode of wide complex tachycardia in the hospital which required cardioversion on 09/29/2014. Currently patient is on oral amiodarone as recommended by the EP. We'll continue to monitor the patient on telemetry   Acute onset Afib 6/6 Patient acute onset A. fib, chads 2vasc  score is 3, patient is currently on IV heparin. Patient started on coumadin per pharmacy.   PAD - R LE ischemia/pain  Now s/p R AKA - Vasc Surgery following -  Will discontinue morphine and start Oxycodone 10 mg po q 4 hr prn Will increase the dose of oxycontin to 20 mg po q1 2 hr, continue oxycodone 10 mg po q 4 hr prn.  Bilateral lower extremity cellulitis w/ sepsis due to unspecified organism Patient presented with bilateral lower extremity cellulitis and was started on IV antibiotic switch were discontinued yesterday. Patient continues to be afebrile.   Jaundice  total bilirubin fluctuating - acute Hepatitis panel negative - RPR negative - anti-nuclear antibody negative - HIV negative -Hepatic ultrasound revealed normal appearing parenchyma with ascites (likely due to ESRD)   CAD s/p CABG 2010 Chronic Systolic CHF / Cardiomyopathy  -TTE 02/21/14 showed EF 20-25% with grade 3 diastolic dysfunction.  -seen by Dr. Royann Shivers on 08/07/14 w/ decision regarding cardiac device therapy depending heavily on his  decision regarding dialysis - ICD was to be considered if he started dialysis. -6/3 TTE shows cardiomyopathy with worsening EF now at 20%  -EP does not feel he is presently a candidate for aggressive procedures -Volume management per hemodialysis  HTN -Blood pressures currently reasonably controlled   Hyperlipidemia  -lipid panel abnormal  DM type-II  -6/3 A1c 6.4 - CBG currently controlled   Code Status: Full code Family Communication: No family at bedside Disposition Plan: Skilled nursing facility   Consultants: Nephrology Vasc Surgery EP  Procedures:  Echocardiogram- EF 20%, mild LVH  Antibiotics: Vancomycin 6 /1 > 6/15 Zosyn 6/1 > 6/15  HPI/Subjective: 49 y.o. M Hx hypertension, hyperlipidemia, diabetes mellitus, GERD, CAD S/P CABG 2010, combined systolic and diastolic congestive heart failure (EF 20-25% with grade 3 diastolic dysfunction), chronic kidney disease stage IV, severe PAD, and substance abuse who presented with right leg swelling and pain for several days. His legs became red from foot to upper thigh medially. He did not have fever or chills. Patient was unable to ambulate secondary to pain.  Patient started on oxycontin 15 mg po bid and prn oxycodone, says the pain improved but still hurting.  Objective: Filed Vitals:   10/12/14 0944  BP: 121/64  Pulse: 102  Temp: 98 F (36.7 C)  Resp: 18    Intake/Output Summary (Last 24 hours) at 10/12/14 1502 Last data filed at 10/12/14 1418  Gross per 24 hour  Intake   2136 ml  Output   3500 ml  Net  -1364 ml   Filed Weights   10/11/14 1330 10/11/14 2100 10/12/14 0500  Weight: 74.3 kg (163 lb 12.8 oz) 72.2 kg (159  lb 2.8 oz) 72.2 kg (159 lb 2.8 oz)    Exam:   General:  Appears in no acute distress  Cardiovascular: S1-S2 is regular, no murmurs rubs or gallops  Respiratory: Clear to auscultation bilaterally, no wheezing or crackles  Abdomen: Soft, nontender, no organomegaly  Musculoskeletal:  Status post right AKA, right stump in dressing   Data Reviewed: Basic Metabolic Panel:  Recent Labs Lab 10/06/14 0520  10/08/14 0002 10/08/14 0901 10/09/14 0826 10/10/14 0507 10/11/14 1408  NA 134*  < > 132* 132* 131* 134* 132*  K 4.1  < > 4.2 3.7 3.6 4.0 4.2  CL 98*  < > 94* 96* 92* 98* 97*  CO2 21*  < > 21* 21* GLUCOSE 167*  < > 138* 136* 121* 157* 175*  BUN 52*  < > 67* 31* 39* 25* 36*  CREATININE 5.00*  < > 6.12* 3.66* 4.55* 3.61* 5.00*  CALCIUM 8.2*  < > 8.3* 8.3* 8.3* 8.5* 8.5*  MG  --   --   --  1.8  --   --   --   PHOS 5.1*  --  6.7*  --  5.6*  --   --   < > = values in this interval not displayed. Liver Function Tests:  Recent Labs Lab 10/06/14 0520 10/08/14 0002 10/08/14 0901 10/09/14 0826  AST  --   --  19  --   ALT  --   --  13*  --   ALKPHOS  --   --  95  --   BILITOT  --   --  2.6*  --   PROT  --   --  5.7*  --   ALBUMIN 2.1* 1.9* 2.1* 2.0*   No results for input(s): LIPASE, AMYLASE in the last 168 hours. No results for input(s): AMMONIA in the last 168 hours. CBC:  Recent Labs Lab 10/08/14 0901 10/09/14 0500 10/10/14 0507 10/11/14 1408 10/12/14 1108  WBC 6.5 6.3 7.1 6.9 7.3  NEUTROABS 5.2  --   --   --   --   HGB 10.1* 9.1* 9.5* 8.5* 8.3*  HCT 30.7* 27.3* 29.1* 26.2* 25.9*  MCV 85.3 85.0 87.1 86.8 87.8  PLT 128* 136* 139* 128* 121*   Cardiac Enzymes: No results for input(s): CKTOTAL, CKMB, CKMBINDEX, TROPONINI in the last 168 hours. BNP (last 3 results)  Recent Labs  09/25/14 0410  BNP 2227.0*    ProBNP (last 3 results) No results for input(s): PROBNP in the last 8760 hours.  CBG:  Recent Labs Lab 10/11/14 1132 10/11/14 1737 10/11/14 2150 10/12/14 0746 10/12/14 1130  GLUCAP 120* 143* 123* 127* 117*    Recent Results (from the past 240 hour(s))  Clostridium Difficile by PCR (not at [facility])     Status: Abnormal   Collection Time: 10/09/14  4:33 AM  Result Value Ref Range Status   C difficile by pcr POSITIVE (A)  NEGATIVE Final    Comment: CRITICAL RESULT CALLED TO, READ BACK BY AND VERIFIED WITH: RN HAYWOOD,K AT 0160 [id number] [name]      Studies: No results found.  Scheduled Meds: . amiodarone  400 mg Oral BID  . calcium acetate  1,334 mg Oral TID WC  . darbepoetin (ARANESP) injection - DIALYSIS  100 mcg Intravenous Q Tue-HD  . feeding supplement (NEPRO CARB STEADY)  237 mL Oral BID BM  . feeding supplement (PRO-STAT SUGAR FREE 64)  30 mL Oral BID  . folic acid  1 mg Oral Daily  . insulin aspart  0-9 Units Subcutaneous TID WC  . metoprolol tartrate  25 mg Oral BID  . multivitamin  1 tablet Oral QHS  . OxyCODONE  15 mg Oral Q12H  . pantoprazole  40 mg Oral Q1200  . polyethylene glycol  17 g Oral BID  . senna-docusate  1 tablet Oral BID  . sodium chloride  3 mL Intravenous Q12H  . thiamine  100 mg Oral Daily  . vancomycin  125 mg Oral QID  . warfarin  7.5 mg Oral ONCE-1800  . Warfarin - Pharmacist Dosing Inpatient   Does not apply q1800   Continuous Infusions: . sodium chloride 25 mL/hr (10/10/14 0851)  . sodium chloride    . heparin 2,000 Units/hr (10/12/14 1225)    Principal Problem:   Bilateral lower leg cellulitis Active Problems:   S/P CABG x 3:  March 2010: left internal mammary artery to distal left anterior descending, SVG to Circ marginal vessel, SVG to PDA   HTN (hypertension)   Hyperlipidemia   DM2 (diabetes mellitus, type 2)   Acute renal failure superimposed on stage 4 chronic kidney disease   CAD (coronary artery disease)   NSTEMI (non-ST elevated myocardial infarction)   PAD (peripheral artery disease)   GERD (gastroesophageal reflux disease)   Combined systolic and diastolic congestive heart failure   Cellulitis of right leg   Sepsis   Cellulitis   Constipation   Blood poisoning   Jaundice   Peripheral artery disease   CAD in native artery   Essential hypertension   HLD (hyperlipidemia)   Diabetes type 2, uncontrolled   Acute on chronic renal  failure   Chronic combined systolic and diastolic CHF (congestive heart failure)   Metabolic encephalopathy   Cellulitis of leg, right   Cellulitis of leg, left   Chronic systolic heart failure   Cardiomyopathy   Hypokalemia   Systolic CHF, chronic   Chronic systolic CHF (congestive heart failure)   Diabetes type 2, controlled   Protein-calorie malnutrition, severe   Sepsis affecting skin   Gangrene of foot   Pressure ulcer   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 If 7PM-7AM, please contact night-coverage at [email or web address], password [facility] 10/12/2014, 3:02 PM  LOS: 18 days

## 2014-10-12 NOTE — Progress Notes (Addendum)
ANTICOAGULATION CONSULT NOTE - Follow Up Consult  Pharmacy Consult for heparin and coumadin Indication: atrial fibrillation  No Known Allergies  Patient Measurements: Height: 5\' 11"  (180.3 cm) Weight: 159 lb 2.8 oz (72.2 kg) IBW/kg (Calculated) : 75.3  Heparin Wt 72.2 kg   Vital Signs: Temp: 98 F (36.7 C) (06/19 0944) Temp Source: Oral (06/19 0944) BP: 121/64 mmHg (06/19 0944) Pulse Rate: 102 (06/19 0944)  Labs:  Recent Labs  10/10/14 0507 10/11/14 1405 10/11/14 1408 10/12/14 1108  HGB 9.5*  --  8.5* 8.3*  HCT 29.1*  --  26.2* 25.9*  PLT 139*  --  128* 121*  LABPROT 15.6*  --  15.9* 16.1*  INR 1.22  --  1.25 1.28  HEPARINUNFRC 0.47 0.40  --  0.15*  CREATININE 3.61*  --  5.00*  --     Estimated Creatinine Clearance: 18.1 mL/min (by C-G formula based on Cr of 5).  Assessment: Pharmacy dosing heparin for new onset afib.  Heparin level therapeutic at 0.40 on 1800 units/hr for 5 days.  Today the heparin level returned subtherapeutic at 0.15.  RN reports of issues with lab draw early this AM.  INR today is 1.28, which may be too early to see any effects from first doses of warfarin but I would expect a small increase.    Hgb stable at 8.3 and Plt 121 also trending down slightly.  No overt s/sx bleeding reported.  Of note, on amio 400 mg bid which will increase INR.    Goal of Therapy:  Heparin level 0.3-0.7 units/ml Monitor platelets by anticoagulation protocol: Yes   Plan:  - Increase heparin gtt to 2000 units/hr - Heparin level 8 hours after change infusion rate - Coumadin 5 mg x 1 tonight - Daily INR, HL, CBC - Monitor for signs and symptoms of bleeding  Red Christians, Pharm. D. Clinical Pharmacy Resident Pager: [phone] Ph: [phone] 10/12/2014 12:15 PM  ---------------------------------------------------------------------------------------------------------------------- Addendum:   Heparin level this evening remains SUBtherapeutic though trended up after a  rate increase earlier today (HL 0.23 << 0.15, goal of 0.3-0.7).   Plan 1. Increase heparin to 2150 units/hr (21.5 ml/hr) 2. Will continue to monitor for any signs/symptoms of bleeding and will follow up with heparin level in 8 hours   Georgina Pillion, PharmD, BCPS Clinical Pharmacist Pager: [phone] 10/12/2014 9:44 PM

## 2014-10-12 NOTE — Discharge Instructions (Signed)

## 2014-10-13 ENCOUNTER — Encounter (HOSPITAL_COMMUNITY): Payer: Self-pay | Admitting: Vascular Surgery

## 2014-10-13 LAB — CBC
HCT: 26.1 % — ABNORMAL LOW (ref 39.0–52.0)
HEMOGLOBIN: 8.3 g/dL — AB (ref 13.0–17.0)
MCH: 28.1 pg (ref 26.0–34.0)
MCHC: 31.8 g/dL (ref 30.0–36.0)
MCV: 88.5 fL (ref 78.0–100.0)
Platelets: 128 10*3/uL — ABNORMAL LOW (ref 150–400)
RBC: 2.95 MIL/uL — AB (ref 4.22–5.81)
RDW: 16.1 % — ABNORMAL HIGH (ref 11.5–15.5)
WBC: 8.1 10*3/uL (ref 4.0–10.5)

## 2014-10-13 LAB — PROTIME-INR
INR: 1.43 (ref 0.00–1.49)
Prothrombin Time: 17.5 seconds — ABNORMAL HIGH (ref 11.6–15.2)

## 2014-10-13 LAB — HEPARIN LEVEL (UNFRACTIONATED)
Heparin Unfractionated: 0.21 IU/mL — ABNORMAL LOW (ref 0.30–0.70)
Heparin Unfractionated: 0.74 IU/mL — ABNORMAL HIGH (ref 0.30–0.70)

## 2014-10-13 LAB — GLUCOSE, CAPILLARY
GLUCOSE-CAPILLARY: 114 mg/dL — AB (ref 65–99)
Glucose-Capillary: 141 mg/dL — ABNORMAL HIGH (ref 65–99)
Glucose-Capillary: 126 mg/dL — ABNORMAL HIGH (ref 65–99)
Glucose-Capillary: 128 mg/dL — ABNORMAL HIGH (ref 65–99)

## 2014-10-13 MED ORDER — HYDROMORPHONE HCL 1 MG/ML IJ SOLN
1.0000 mg | INTRAMUSCULAR | Status: DC | PRN
  Administered 2014-10-13 – 2014-10-17 (×15): 1 mg via INTRAVENOUS
  Filled 2014-10-13 (×12): qty 1

## 2014-10-13 MED ORDER — WARFARIN SODIUM 5 MG PO TABS
5.0000 mg | ORAL_TABLET | Freq: Once | ORAL | Status: AC
  Administered 2014-10-13: 5 mg via ORAL
  Filled 2014-10-13: qty 1

## 2014-10-13 MED ORDER — OXYCODONE HCL 5 MG PO TABS: 10.0000 mg | ORAL_TABLET | ORAL | Status: DC | PRN

## 2014-10-13 NOTE — Progress Notes (Signed)
[facility] PROGRESS NOTE  [name] ZOX:[id number] DOB: 08/06/1965 DOA: 09/24/2014 PCP: [name], NP  Assessment/Plan:  Metabolic Encephalopathy Patient presented with metabolic encephalopathy likely from uremia worsening cardiac function and sedating medications. At this time the metabolic insult. As this time it is resolved and patient is at baseline.  New ESRD on chronic renal failure  Patient's previous creatinine 5.85 on 1117/15, was seen by nephrology and started on hemodialysis via the right chest hemodialysis catheter.POD # 1 Left AV fistula Brachial/cephalic  ready to use in 12 weeks  Episode of monomorphic VT / wide complex tachycardia  Patient had episode of wide complex tachycardia in the hospital which required cardioversion on 09/29/2014. Currently patient is on oral amiodarone as recommended by the EP. We'll continue to monitor the patient on telemetry   Acute onset Afib 6/6 Patient acute onset A. fib, chads 2vasc  score is 3, patient is currently on IV heparin. Patient started on coumadin per pharmacy.  PAD - R LE ischemia/pain  Now s/p R AKA - Vasc Surgery following -  Will discontinue morphine and start Oxycodone 10 mg po q 4 hr prn Dose of oxycontin was increased to 20 mg po q12 hr, will start dilaudid 1 mg Iv q 2 hr prn  Bilateral lower extremity cellulitis w/ sepsis due to unspecified organism Patient presented with bilateral lower extremity cellulitis and was started on IV antibiotic switch were discontinued yesterday. Patient continues to be afebrile.  Jaundice  total bilirubin fluctuating - acute Hepatitis panel negative - RPR negative - anti-nuclear antibody negative - HIV negative -Hepatic ultrasound revealed normal appearing parenchyma with ascites (likely due to ESRD)   CAD s/p CABG 2010 Chronic Systolic CHF / Cardiomyopathy  -TTE 02/21/14 showed EF 20-25% with grade 3 diastolic dysfunction.  -seen by Dr. Royann Shivers on 08/07/14 w/ decision  regarding cardiac device therapy depending heavily on his decision regarding dialysis - ICD was to be considered if he started dialysis. -6/3 TTE shows cardiomyopathy with worsening EF now at 20%  -EP does not feel he is presently a candidate for aggressive procedures -Volume management per hemodialysis  HTN Blood pressures currently reasonably controlled   Hyperlipidemia  Lipid panel abnormal  DM type-II  -6/3 A1c 6.4 - CBG currently controlled   Code Status: Full code Family Communication: No family at bedside Disposition Plan: Skilled nursing facility   Consultants: Nephrology Vasc Surgery EP  Procedures:  Echocardiogram- EF 20%, mild LVH  Antibiotics: Vancomycin 6 /1 > 6/15 Zosyn 6/1 > 6/15  HPI/Subjective: 49 y.o. M Hx hypertension, hyperlipidemia, diabetes mellitus, GERD, CAD S/P CABG 2010, combined systolic and diastolic congestive heart failure (EF 20-25% with grade 3 diastolic dysfunction), chronic kidney disease stage IV, severe PAD, and substance abuse who presented with right leg swelling and pain for several days. His legs became red from foot to upper thigh medially. He did not have fever or chills. Patient was unable to ambulate secondary to pain.   Patient still complains of pain, was started on oxycontin 20 mg po bid and prn oxycodone.  Objective: Filed Vitals:   10/13/14 0743  BP: 130/60  Pulse: 66  Temp: 98.1 F (36.7 C)  Resp: 17    Intake/Output Summary (Last 24 hours) at 10/13/14 1500 Last data filed at 10/13/14 1409  Gross per 24 hour  Intake 1019.42 ml  Output    300 ml  Net 719.42 ml   Filed Weights   10/12/14 0500 10/12/14 2106 10/13/14 0500  Weight: 72.2 kg (  159 lb 2.8 oz) 73.211 kg (161 lb 6.4 oz) 73.21 kg (161 lb 6.4 oz)    Exam:   General:  Appears in no acute distress  Cardiovascular: S1-S2 is regular, no murmurs rubs or gallops  Respiratory: Clear to auscultation bilaterally, no wheezing or crackles  Abdomen: Soft,  nontender, no organomegaly  Musculoskeletal: Status post right AKA, right stump in dressing   Data Reviewed: Basic Metabolic Panel:  Recent Labs Lab 10/08/14 0002 10/08/14 0901 10/09/14 0826 10/10/14 0507 10/11/14 1408  NA 132* 132* 131* 134* 132*  K 4.2 3.7 3.6 4.0 4.2  CL 94* 96* 92* 98* 97*  CO2 21* 21* GLUCOSE 138* 136* 121* 157* 175*  BUN 67* 31* 39* 25* 36*  CREATININE 6.12* 3.66* 4.55* 3.61* 5.00*  CALCIUM 8.3* 8.3* 8.3* 8.5* 8.5*  MG  --  1.8  --   --   --   PHOS 6.7*  --  5.6*  --   --    Liver Function Tests:  Recent Labs Lab 10/08/14 0002 10/08/14 0901 10/09/14 0826  AST  --  19  --   ALT  --  13*  --   ALKPHOS  --  95  --   BILITOT  --  2.6*  --   PROT  --  5.7*  --   ALBUMIN 1.9* 2.1* 2.0*   No results for input(s): LIPASE, AMYLASE in the last 168 hours. No results for input(s): AMMONIA in the last 168 hours. CBC:  Recent Labs Lab 10/08/14 0901 10/09/14 0500 10/10/14 0507 10/11/14 1408 10/12/14 1108 10/13/14 0607  WBC 6.5 6.3 7.1 6.9 7.3 8.1  NEUTROABS 5.2  --   --   --   --   --   HGB 10.1* 9.1* 9.5* 8.5* 8.3* 8.3*  HCT 30.7* 27.3* 29.1* 26.2* 25.9* 26.1*  MCV 85.3 85.0 87.1 86.8 87.8 88.5  PLT 128* 136* 139* 128* 121* 128*   Cardiac Enzymes: No results for input(s): CKTOTAL, CKMB, CKMBINDEX, TROPONINI in the last 168 hours. BNP (last 3 results)  Recent Labs  09/25/14 0410  BNP 2227.0*    ProBNP (last 3 results) No results for input(s): PROBNP in the last 8760 hours.  CBG:  Recent Labs Lab 10/12/14 1130 10/12/14 1631 10/12/14 2103 10/13/14 0737 10/13/14 1221  GLUCAP 117* 141* 130* 141* 126*    Recent Results (from the past 240 hour(s))  Clostridium Difficile by PCR (not at [facility])     Status: Abnormal   Collection Time: 10/09/14  4:33 AM  Result Value Ref Range Status   C difficile by pcr POSITIVE (A) NEGATIVE Final    Comment: CRITICAL RESULT CALLED TO, READ BACK BY AND VERIFIED WITH: RN HAYWOOD,K AT 1610  [id number] [name]      Studies: No results found.  Scheduled Meds: . amiodarone  400 mg Oral BID  . calcium acetate  1,334 mg Oral TID WC  . darbepoetin (ARANESP) injection - DIALYSIS  100 mcg Intravenous Q Tue-HD  . feeding supplement (NEPRO CARB STEADY)  237 mL Oral BID BM  . feeding supplement (PRO-STAT SUGAR FREE 64)  30 mL Oral BID  . folic acid  1 mg Oral Daily  . insulin aspart  0-9 Units Subcutaneous TID WC  . metoprolol tartrate  25 mg Oral BID  . multivitamin  1 tablet Oral QHS  . OxyCODONE  20 mg Oral Q12H  . pantoprazole  40 mg Oral Q1200  . polyethylene glycol  17 g  Oral BID  . senna-docusate  1 tablet Oral BID  . sodium chloride  3 mL Intravenous Q12H  . thiamine  100 mg Oral Daily  . vancomycin  125 mg Oral QID  . warfarin  5 mg Oral ONCE-1800  . Warfarin - Pharmacist Dosing Inpatient   Does not apply q1800   Continuous Infusions: . sodium chloride 25 mL/hr (10/10/14 0851)  . sodium chloride    . heparin 2,350 Units/hr (10/13/14 9417)    Principal Problem:   Bilateral lower leg cellulitis Active Problems:   S/P CABG x 3:  March 2010: left internal mammary artery to distal left anterior descending, SVG to Circ marginal vessel, SVG to PDA   HTN (hypertension)   Hyperlipidemia   DM2 (diabetes mellitus, type 2)   Acute renal failure superimposed on stage 4 chronic kidney disease   CAD (coronary artery disease)   NSTEMI (non-ST elevated myocardial infarction)   PAD (peripheral artery disease)   GERD (gastroesophageal reflux disease)   Combined systolic and diastolic congestive heart failure   Cellulitis of right leg   Sepsis   Cellulitis   Constipation   Blood poisoning   Jaundice   Peripheral artery disease   CAD in native artery   Essential hypertension   HLD (hyperlipidemia)   Diabetes type 2, uncontrolled   Acute on chronic renal failure   Chronic combined systolic and diastolic CHF (congestive heart failure)   Metabolic encephalopathy    Cellulitis of leg, right   Cellulitis of leg, left   Chronic systolic heart failure   Cardiomyopathy   Hypokalemia   Systolic CHF, chronic   Chronic systolic CHF (congestive heart failure)   Diabetes type 2, controlled   Protein-calorie malnutrition, severe   Sepsis affecting skin   Gangrene of foot   Pressure ulcer   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 If 7PM-7AM, please contact night-coverage at [email or web address], password [facility] 10/13/2014, 3:00 PM  LOS: 19 days

## 2014-10-13 NOTE — Progress Notes (Signed)
Physical Therapy Treatment Patient Details Name: [name] MRN: [id number] DOB: Sep 02, 1965 Today's Date: 10/13/2014    History of Present Illness Pt admitted with RLE cellulitis. PMHx of HTN, HLD, DM, GERD, CHF, CKD, polysubstance abuse. 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    PT Comments    More difficulty with lateral scoot transfer back to bed "uphill", but pt is clearly getting more of the idea of how to perform; we discussed the need to gently continue owrk on standing and therex if his goal is to be able to walk on a prosthesis   Follow Up Recommendations  SNF     Equipment Recommendations  None recommended by PT (perhaps sliding board and wheelchair)    Recommendations for Other Services OT consult     Precautions / Restrictions Precautions Precautions: Fall    Mobility  Bed Mobility Overal bed mobility: Needs Assistance Bed Mobility: Sit to Supine     Supine to sit: Min assist Sit to supine: Min guard   General bed mobility comments: minguard for safety  Transfers Overall transfer level: Needs assistance Equipment used:  (sliding board) Transfers: Lateral/Scoot Transfers          Lateral/Scoot Transfers: Mod assist;+2 safety/equipment General transfer comment: Step by step cues for technique incuding board palcement, hand postioning, using LLE as a pivot point during transfer; overall tolerated well; more assist as pt tranferred back to bed "uphill"  Ambulation/Gait                 Stairs            Wheelchair Mobility    Modified Rankin (Stroke Patients Only)       Balance     Sitting balance-Leahy Scale: Fair                              Cognition Arousal/Alertness: Awake/alert Behavior During Therapy: WFL for tasks assessed/performed Overall Cognitive Status: Within Functional Limits for tasks assessed                      Exercises Amputee Exercises Hip Extension: Right;5 reps;Sidelying    General Comments         Pertinent Vitals/Pain Pain Assessment: Faces Faces Pain Scale: Hurts little more Pain Location: R residual limb Pain Descriptors / Indicators: Grimacing Pain Intervention(s): Limited activity within patient's tolerance;Monitored during session;Repositioned    Home Living                      Prior Function            PT Goals (current goals can now be found in the care plan section) Acute Rehab PT Goals Patient Stated Goal: wanting back to bed PT Goal Formulation: With patient Time For Goal Achievement: 10/23/14 Potential to Achieve Goals: Fair Progress towards PT goals: Progressing toward goals    Frequency  Min 3X/week    PT Plan Current plan remains appropriate    Co-evaluation             End of Session Equipment Utilized During Treatment:  (bed pad) Activity Tolerance: Patient tolerated treatment well Patient left: in bed;with call bell/phone within reach     Time: 1214-1232 PT Time Calculation (min) (ACUTE ONLY): 18 min  Charges:  $Therapeutic Activity: 8-22 mins                    G Codes:  Van Clines Hamff 10/13/2014, 1:49 PM  Van Clines, [place]  Acute Rehabilitation Services Pager [phone] Office [phone]

## 2014-10-13 NOTE — Progress Notes (Signed)
Chaplain respond to nurse request:  Nurse express patient might have these concerns:   Anxiety/  Sadness  Patient: Had part of his right lower leg amputate.  He did agree to see me and allowed me to out the blinds for the sun to come into the room. We started to explored these areas briefly then his family came in and excused myself, so they could have family time. He thanked me for that and ask if I could stop by tomorrow.   Will follow up / per patient request.

## 2014-10-13 NOTE — Progress Notes (Signed)
10/13/2014 2:40 PM Hemodialysis Outpatient Note; We have re-faxed this patient's information to the [facility] in hopes of obtaining an outpatient schedule. Tilman Neat

## 2014-10-13 NOTE — Progress Notes (Addendum)
[facility] Rounding Note  Subjective:  No new events Appears will be going to [facility] SNF (his CLIP is still pending) Lunch at bedside - says family brought him a hamburger Not sleeping very well  06/19 0701 - 06/20 0700 In: 1559.4 [P.O.:1080; I.V.:479.4] Out: -   Filed Weights   10/12/14 0500 10/12/14 2106 10/13/14 0500  Weight: 72.2 kg (159 lb 2.8 oz) 73.211 kg (161 lb 6.4 oz) 73.21 kg (161 lb 6.4 oz)   Physical Exam:  Blood pressure 130/60, pulse 66, temperature 98.1 F (36.7 C), temperature source Oral, resp. rate 17, height  (1.803 m), weight 73.21 kg (161 lb 6.4 oz), SpO2 99 %. NAD, looks disheveled Multiple tattoos S1S2 No S3 Lungs are clear Left upper arm AVF + bruit and thrill Right IJ TDC Abd non tender Right AKA stump wrapped + edema of stump 1-2+ edema of LLE  Scheduled Meds: . amiodarone  400 mg Oral BID  . calcium acetate  1,334 mg Oral TID WC  . darbepoetin (ARANESP) injection - DIALYSIS  100 mcg Intravenous Q Tue-HD  . feeding supplement (NEPRO CARB STEADY)  237 mL Oral BID BM  . feeding supplement (PRO-STAT SUGAR FREE 64)  30 mL Oral BID  . folic acid  1 mg Oral Daily  . insulin aspart  0-9 Units Subcutaneous TID WC  . metoprolol tartrate  25 mg Oral BID  . multivitamin  1 tablet Oral QHS  . OxyCODONE  20 mg Oral Q12H  . pantoprazole  40 mg Oral Q1200  . polyethylene glycol  17 g Oral BID  . senna-docusate  1 tablet Oral BID  . sodium chloride  3 mL Intravenous Q12H  . thiamine  100 mg Oral Daily  . vancomycin  125 mg Oral QID  . warfarin  5 mg Oral ONCE-1800  . Warfarin - Pharmacist Dosing Inpatient   Does not apply q1800   Continuous Infusions: . sodium chloride 25 mL/hr (10/10/14 0851)  . sodium chloride    . heparin 2,350 Units/hr (10/13/14 0938)   PRN Meds:.acetaminophen, HYDROmorphone (DILAUDID) injection, nitroGLYCERIN, ondansetron (ZOFRAN) IV   Recent Labs Lab 10/08/14 0002  10/09/14 0826  10/10/14 0507 10/11/14 1408  NA 132*  < > 131* 134* 132*  K 4.2  < > 3.6 4.0 4.2  CL 94*  < > 92* 98* 97*  CO2 21*  < > GLUCOSE 138*  < > 121* 157* 175*  BUN 67*  < > 39* 25* 36*  CREATININE 6.12*  < > 4.55* 3.61* 5.00*  CALCIUM 8.3*  < > 8.3* 8.5* 8.5*  PHOS 6.7*  --  5.6*  --   --   < > = values in this interval not displayed.  Recent Labs Lab 10/08/14 0901  10/11/14 1408 10/12/14 1108 10/13/14 0607  WBC 6.5  < > 6.9 7.3 8.1  NEUTROABS 5.2  --   --   --   --   HGB 10.1*  < > 8.5* 8.3* 8.3*  HCT 30.7*  < > 26.2* 25.9* 26.1*  MCV 85.3  < > 86.8 87.8 88.5  PLT 128*  < > 128* 121* 128*  < > = values in this interval not displayed.    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  Admitted with leg pain and swelling, s/p  AKA RLE 10/06/14. Creatinine on admission 7's, BUN 140's. Deemed new ESRD and HD initiated 09/28/14.  A/P 1. New ESRD 1. Started 6/5 2. Cont on THS Schedule 3. S/p L BC AVF 6/17 VVS Early 4. CLIP in process; now that SNF, will need to finalize - still pending at this time 5. 4h, on systemic heparin (--> PO Warfarin) 2. HTN/Vol 1. Post weight goal 70kg next Tx 2. BP stable only on MTP 3. Anemia 1. Aranesp 100 qTues 2. Rec'd Feraheme as well 3. Monitor 4. PAD and ischemic RLE, 1. 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 CKD-BMD 1. PTH = 97 on 09/29/14 2. Phos up, added PhosLo to meals 3. Follow 6. AMS, improved 7. C Diff colitis: PO Vanc 8. Episode of VT req cardioversion 6/6, on amio 9. AFib on anticoagulation (heparin->warfarin per pharmacy) 10. Systolic (EF 20-25) and diastolic (Gr 3)  HF: cont to lower EDW 11. CAD s/p CABG (2010)  [name], MD [facility] [phone] Pager 10/13/2014, 12:14 PM

## 2014-10-13 NOTE — Progress Notes (Signed)
ANTICOAGULATION CONSULT NOTE - Follow Up Consult  Pharmacy Consult for Heparin and Coumadin Indication: atrial fibrillation  No Known Allergies  Patient Measurements: Height: 5\' 11"  (180.3 cm) Weight: 161 lb 6.4 oz (73.21 kg) IBW/kg (Calculated) : 75.3 Heparin Dosing Weight: 72.2 kg  Vital Signs: Temp: 97.6 F (36.4 C) (06/20 1743) Temp Source: Oral (06/20 1743) BP: 140/64 mmHg (06/20 1743) Pulse Rate: 66 (06/20 1743)  Labs:  Recent Labs  10/11/14 1408 10/12/14 1108 10/12/14 2050 10/13/14 0607 10/13/14 1802  HGB 8.5* 8.3*  --  8.3*  --   HCT 26.2* 25.9*  --  26.1*  --   PLT 128* 121*  --  128*  --   LABPROT 15.9* 16.1*  --  17.5*  --   INR 1.25 1.28  --  1.43  --   HEPARINUNFRC  --  0.15* 0.23* 0.21* 0.74*  CREATININE 5.00*  --   --   --   --     Estimated Creatinine Clearance: 18.3 mL/min (by C-G formula based on Cr of 5).  Assessment: Patient is a 49 y.o. male with new onset Afib, on heparin bridge to coumadin. Heparin level is SUPRAtherapeutic after rate increase today (HL =0.74). Last INR 1.43, trending up but missed a dose of warfarin last night. No overt s/sx of bleeding reported. Of note, patient is on amiodarone 400 mg BID  Goal of Therapy:  Heparin level 0.3-0.7 units/ml Monitor platelets by anticoagulation protocol: Yes   Plan:  - Decrease heparin gtt to 2250 units/hr - Heparin level 8 hours after change in infusion rate - Patient received warfarin 5 mg x 1 already tonight - Daily INR, HL, and CBC - Monitor for s/sx of bleeding   Juanita Craver, PharmD, BCPS Pharmacy Resident

## 2014-10-13 NOTE — Progress Notes (Signed)
ANTICOAGULATION CONSULT NOTE - Follow Up Consult  Pharmacy Consult for heparin and coumadin Indication: atrial fibrillation  No Known Allergies  Patient Measurements: Height: 5\' 11"  (180.3 cm) Weight: 161 lb 6.4 oz (73.21 kg) IBW/kg (Calculated) : 75.3  Heparin Wt 72.2 kg   Vital Signs: Temp: 98.1 F (36.7 C) (06/20 0743) Temp Source: Oral (06/20 0743) BP: 130/60 mmHg (06/20 0743) Pulse Rate: 66 (06/20 0743)  Labs:  Recent Labs  10/11/14 1408 10/12/14 1108 10/12/14 2050 10/13/14 0607  HGB 8.5* 8.3*  --  8.3*  HCT 26.2* 25.9*  --  26.1*  PLT 128* 121*  --  128*  LABPROT 15.9* 16.1*  --  17.5*  INR 1.25 1.28  --  1.43  HEPARINUNFRC  --  0.15* 0.23* 0.21*  CREATININE 5.00*  --   --   --     Estimated Creatinine Clearance: 18.3 mL/min (by C-G formula based on Cr of 5).  Assessment: 49 YOM with new onset afib, on heparin bridge to coumadin. Heparin level remains subtherapeutic after rate increase last night, no interruption with infusion, INR 1.43, trending up, missed coumadin dose last night. Hgb low stable at 8.3 and Plt 128 K. No overt s/sx bleeding reported. On amio 400 mg bid.    Goal of Therapy:  Heparin level 0.3-0.7 units/ml Monitor platelets by anticoagulation protocol: Yes   Plan:  - Increase heparin gtt to 2350 units/hr - Heparin level 8 hours after change infusion rate  - Coumadin 5 mg x 1 tonight - Daily INR, HL, CBC - Monitor for signs and symptoms of bleeding  Bayard Hugger, PharmD, BCPS  Clinical Pharmacist  Pager: [phone]   10/13/2014 9:28 AM

## 2014-10-13 NOTE — Care Management Note (Signed)
Case Management Note  Patient Details  Name: [name] MRN: [id number] Date of Birth: 02/16/1966  Subjective/Objective:                CM following for progression and d/c planning.     Action/Plan: 10/13/2014 Met with pt who is still in pain and unable to discuss plan of care, currently plan is for SNF.  Expected Discharge Date:       10/17/2014           Expected Discharge Plan:  Skilled Nursing Facility  In-House Referral:  Clinical Social Work  Discharge planning Services  NA  Post Acute Care Choice:  NA Choice offered to:  NA  DME Arranged:    DME Agency:     HH Arranged:    [place] Agency:     Status of Service:     Medicare Important Message Given:  Yes Date Medicare IM Given:  09/29/14 Medicare IM give by:  debbie dowell rn,bsn Date Additional Medicare IM Given:  10/13/14 Additional Medicare Important Message give by:  Jasmine Pang RN MPH, case manager, [phone]  If discussed at [place] of Stay Meetings, dates discussed:    Additional Comments:  Adron Bene, RN 10/13/2014, 11:02 AM

## 2014-10-13 NOTE — Progress Notes (Signed)
Physical Therapy Treatment Patient Details Name: [name] MRN: [id number] DOB: Sep 03, 1965 Today's Date: 10/13/2014    History of Present Illness Pt admitted with RLE cellulitis. PMHx of HTN, HLD, DM, GERD, CHF, CKD, polysubstance abuse. 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    PT Comments    Opted to work on transfers with sliding board to be able to boost pt's confidence and self-efficacy that he can still transfer even if he is anxious about standing up  Follow Up Recommendations  SNF     Equipment Recommendations  None recommended by PT    Recommendations for Other Services       Precautions / Restrictions Precautions Precautions: Fall    Mobility  Bed Mobility Overal bed mobility: Needs Assistance Bed Mobility: Supine to Sit     Supine to sit: Min assist     General bed mobility comments: min handheld assist to pull to sitting  Transfers Overall transfer level: Needs assistance Equipment used:  (sliding board) Transfers: Lateral/Scoot Transfers          Lateral/Scoot Transfers: Mod assist;+2 safety/equipment General transfer comment: Step by step cues for technique incuding board palcement, hand postioning, using LLE as a pivot point during transfer; overall tolerated well  Ambulation/Gait                 Stairs            Wheelchair Mobility    Modified Rankin (Stroke Patients Only)       Balance     Sitting balance-Leahy Scale: Fair                              Cognition Arousal/Alertness: Awake/alert Behavior During Therapy: WFL for tasks assessed/performed Overall Cognitive Status: Within Functional Limits for tasks assessed                      Exercises      General Comments        Pertinent Vitals/Pain Pain Assessment: Faces Faces Pain Scale: Hurts even more Pain Location: R residual limb; reports phantom sensation as well Pain Descriptors / Indicators: Aching;Grimacing;Guarding Pain Intervention(s):  Limited activity within patient's tolerance;Monitored during session;Premedicated before session;Repositioned (educated in desensitization)    Home Living                      Prior Function            PT Goals (current goals can now be found in the care plan section) Acute Rehab PT Goals Patient Stated Goal: to get this pain better PT Goal Formulation: With patient Time For Goal Achievement: 10/23/14 (Goals set on original eval continue to be appropriate) Potential to Achieve Goals: Fair Progress towards PT goals: Progressing toward goals    Frequency  Min 3X/week    PT Plan Current plan remains appropriate    Co-evaluation             End of Session Equipment Utilized During Treatment:  (bed pad) Activity Tolerance: Patient tolerated treatment well Patient left: in chair;with call bell/phone within reach     Time: 1100-1126 PT Time Calculation (min) (ACUTE ONLY): 26 min  Charges:  $Therapeutic Activity: 23-37 mins                    G CodesOlen Pel 10/13/2014, 1:41 PM  Van Clines, PT  Acute  Rehabilitation Services Pager [phone] Office [phone]

## 2014-10-14 DIAGNOSIS — N184 Chronic kidney disease, stage 4 (severe): Secondary | ICD-10-CM

## 2014-10-14 LAB — GLUCOSE, CAPILLARY
GLUCOSE-CAPILLARY: 136 mg/dL — AB (ref 65–99)
Glucose-Capillary: 94 mg/dL (ref 65–99)
Glucose-Capillary: 103 mg/dL — ABNORMAL HIGH (ref 65–99)
Glucose-Capillary: 105 mg/dL — ABNORMAL HIGH (ref 65–99)
Glucose-Capillary: 114 mg/dL — ABNORMAL HIGH (ref 65–99)

## 2014-10-14 LAB — CBC
HCT: 25.3 % — ABNORMAL LOW (ref 39.0–52.0)
Hemoglobin: 8.3 g/dL — ABNORMAL LOW (ref 13.0–17.0)
MCH: 29 pg (ref 26.0–34.0)
MCHC: 32.8 g/dL (ref 30.0–36.0)
MCV: 88.5 fL (ref 78.0–100.0)
PLATELETS: 125 10*3/uL — AB (ref 150–400)
RBC: 2.86 MIL/uL — ABNORMAL LOW (ref 4.22–5.81)
RDW: 16.7 % — AB (ref 11.5–15.5)
WBC: 9.4 10*3/uL (ref 4.0–10.5)

## 2014-10-14 LAB — RENAL FUNCTION PANEL
ALBUMIN: 2.2 g/dL — AB (ref 3.5–5.0)
Anion gap: 12 (ref 5–15)
BUN: 44 mg/dL — ABNORMAL HIGH (ref 6–20)
CALCIUM: 9 mg/dL (ref 8.9–10.3)
CO2: 24 mmol/L (ref 22–32)
CREATININE: 5.54 mg/dL — AB (ref 0.61–1.24)
Chloride: 95 mmol/L — ABNORMAL LOW (ref 101–111)
GFR calc Af Amer: 13 mL/min — ABNORMAL LOW (ref >60)
GFR calc non Af Amer: 11 mL/min — ABNORMAL LOW (ref >60)
Glucose, Bld: 110 mg/dL — ABNORMAL HIGH (ref 65–99)
Phosphorus: 4.9 mg/dL — ABNORMAL HIGH (ref 2.5–4.6)
Potassium: 4.3 mmol/L (ref 3.5–5.1)
Sodium: 131 mmol/L — ABNORMAL LOW (ref 135–145)

## 2014-10-14 LAB — PROTIME-INR
INR: 1.66 — AB (ref 0.00–1.49)
Prothrombin Time: 19.6 seconds — ABNORMAL HIGH (ref 11.6–15.2)

## 2014-10-14 LAB — HEPARIN LEVEL (UNFRACTIONATED)
HEPARIN UNFRACTIONATED: 0.84 [IU]/mL — AB (ref 0.30–0.70)
Heparin Unfractionated: 0.88 IU/mL — ABNORMAL HIGH (ref 0.30–0.70)

## 2014-10-14 MED ORDER — WARFARIN SODIUM 5 MG PO TABS
5.0000 mg | ORAL_TABLET | Freq: Once | ORAL | Status: AC
Start: 2014-10-14 — End: 2014-10-14
  Administered 2014-10-14: 5 mg via ORAL
  Filled 2014-10-14: qty 1

## 2014-10-14 MED ORDER — COLLAGENASE 250 UNIT/GM EX OINT
TOPICAL_OINTMENT | Freq: Every day | CUTANEOUS | Status: DC
Start: 2014-10-14 — End: 2014-10-18
  Administered 2014-10-14 – 2014-10-18 (×4): via TOPICAL
  Filled 2014-10-14: qty 30

## 2014-10-14 MED ORDER — GABAPENTIN 300 MG PO CAPS
300.0000 mg | ORAL_CAPSULE | Freq: Three times a day (TID) | ORAL | Status: DC
  Administered 2014-10-14 – 2014-10-15 (×5): 300 mg via ORAL
  Filled 2014-10-14 (×8): qty 1

## 2014-10-14 MED ORDER — DARBEPOETIN ALFA 100 MCG/0.5ML IJ SOSY
PREFILLED_SYRINGE | INTRAMUSCULAR | Status: AC
  Filled 2014-10-14: qty 0.5

## 2014-10-14 MED ORDER — HYDROMORPHONE HCL 1 MG/ML IJ SOLN
INTRAMUSCULAR | Status: AC
  Filled 2014-10-14: qty 1

## 2014-10-14 NOTE — Progress Notes (Signed)
Chaplain: Follow-up  [name] checked out before our follow-up visit today.   10/14/14 1400  Clinical Encounter Type  Visited With Patient not available  Visit Type Follow-up;Spiritual support  Referral From Nurse  Consult/Referral To Nurse  Spiritual Encounters  Spiritual Needs Emotional

## 2014-10-14 NOTE — Progress Notes (Signed)
[facility] Rounding Note  Subjective:  No new events Appears will be going to [facility] SNF (his CLIP is still pending) Seen in the dialysis unit - biggest complaint is pain in his stump  06/20 0701 - 06/21 0700 In: 300 [P.O.:300] Out: 301 [Urine:300; Stool:1]  Filed Weights   10/12/14 2106 10/13/14 0500 10/14/14 0803  Weight: 73.211 kg (161 lb 6.4 oz) 73.21 kg (161 lb 6.4 oz) 75.2 kg (165 lb 12.6 oz)   Physical Exam:  Blood pressure 130/60, pulse 66, temperature 98.1 F (36.7 C), temperature source Oral, resp. rate 17, height 5\' 11"  (1.803 m), weight 73.21 kg (161 lb 6.4 oz), SpO2 99 %. NAD, looks disheveled, moaning withy pain in right stump Multiple tattoos S1S2 No S3 Lungs are clear Left upper arm AVF + bruit and thrill Right IJ TDC in use - BFR 400 Abd non tender. + abdominal wall edema Right AKA stump wrapped + edema of stump above the wrap with edema to the hip 1-2+ edema of LLE  Scheduled Meds: . amiodarone  400 mg Oral BID  . calcium acetate  1,334 mg Oral TID WC  . darbepoetin (ARANESP) injection - DIALYSIS  100 mcg Intravenous Q Tue-HD  . feeding supplement (NEPRO CARB STEADY)  237 mL Oral BID BM  . feeding supplement (PRO-STAT SUGAR FREE 64)  30 mL Oral BID  . folic acid  1 mg Oral Daily  . insulin aspart  0-9 Units Subcutaneous TID WC  . metoprolol tartrate  25 mg Oral BID  . multivitamin  1 tablet Oral QHS  . OxyCODONE  20 mg Oral Q12H  . pantoprazole  40 mg Oral Q1200  . polyethylene glycol  17 g Oral BID  . senna-docusate  1 tablet Oral BID  . sodium chloride  3 mL Intravenous Q12H  . thiamine  100 mg Oral Daily  . vancomycin  125 mg Oral QID  . Warfarin - Pharmacist Dosing Inpatient   Does not apply q1800   Continuous Infusions: . sodium chloride 25 mL/hr (10/10/14 0851)  . sodium chloride    . heparin 2,000 Units/hr (10/14/14 0423)   PRN Meds:.acetaminophen, HYDROmorphone (DILAUDID) injection, nitroGLYCERIN, ondansetron  (ZOFRAN) IV   Recent Labs Lab 10/08/14 0002  10/09/14 0826 10/10/14 0507 10/11/14 1408 10/14/14 0828  NA 132*  < > 131* 134* 132* 131*  K 4.2  < > 3.6 4.0 4.2 4.3  CL 94*  < > 92* 98* 97* 95*  CO2 21*  < > 22 24 23 24   GLUCOSE 138*  < > 121* 157* 175* 110*  BUN 67*  < > 39* 25* 36* 44*  CREATININE 6.12*  < > 4.55* 3.61* 5.00* 5.54*  CALCIUM 8.3*  < > 8.3* 8.5* 8.5* 9.0  PHOS 6.7*  --  5.6*  --   --  4.9*  < > = values in this interval not displayed.  Recent Labs Lab 10/08/14 0901  10/12/14 1108 10/13/14 0607 10/14/14 0328  WBC 6.5  < > 7.3 8.1 9.4  NEUTROABS 5.2  --   --   --   --   HGB 10.1*  < > 8.3* 8.3* 8.3*  HCT 30.7*  < > 25.9* 26.1* 25.3*  MCV 85.3  < > 87.8 88.5 88.5  PLT 128*  < > 121* 128* 125*  < > = values in this interval not displayed.    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 Admitted with leg pain and swelling, s/p  AKA RLE 10/06/14. Creatinine on admission 7's, BUN 140's. Deemed new ESRD and HD initiated 09/28/14.  A/P 1. New ESRD 1. Started 6/5 2. Cont on THS Schedule 3. S/p L BC AVF 6/17 VVS Early - nice bruit and thrill 4. CLIP in process; now that SNF, will need to finalize - still pending at this time (possibly [facility] - awaiting word) 5. 4h, on systemic heparin (--> PO Warfarin) 6. Will minimally need sliding board and wheelchair for transfers - appears per PT Michiel Sites not needed at this time 2. HTN/Vol 1. Post weight goal 70kg today - pre weight was 75.2 so ? If we will get that all off today 2. BP stable only on MTP 3. Anemia 1. Aranesp 100 qTues 2. Rec'd Feraheme as well 3. Monitor 4. PAD and ischemic RLE, 1. 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 CKD-BMD 1. PTH = 97 on 09/29/14 No VDRA needed 2. PhosLo 2ac has normalized phosphorus 6. AMS,  improved 7. C Diff colitis: PO Vanc. End date 10/23/14 8. Episode of VT req cardioversion 6/6, on amio 9. AFib on anticoagulation (heparin->warfarin per pharmacy) He will need to have INR managed by Cardiology at discharge - just thinking ahead so these arrangements can be made 10. Systolic (EF 20-25) and diastolic (Gr 3)  HF: cont to lower EDW as tolerated 11. CAD s/p CABG (2010)  [name], MD [facility] [phone] Pager 10/14/2014, 10:00 AM

## 2014-10-14 NOTE — Progress Notes (Signed)
ANTICOAGULATION CONSULT NOTE - Follow Up Consult  Pharmacy Consult for heparin Indication: atrial fibrillation   Labs:  Recent Labs  10/11/14 1408 10/12/14 1108  10/13/14 0607 10/13/14 1802 10/14/14 0328  HGB 8.5* 8.3*  --  8.3*  --  8.3*  HCT 26.2* 25.9*  --  26.1*  --  25.3*  PLT 128* 121*  --  128*  --  125*  LABPROT 15.9* 16.1*  --  17.5*  --  19.6*  INR 1.25 1.28  --  1.43  --  1.66*  HEPARINUNFRC  --  0.15*  < > 0.21* 0.74* 0.84*  CREATININE 5.00*  --   --   --   --   --   < > = values in this interval not displayed.    Assessment: 49yo male remains supratherapeutic on heparin with higher level despite rate decrease.  Goal of Therapy:  Heparin level 0.3-0.7 units/ml   Plan:  Will decrease heparin gtt by 10% to 2000 units/hr and check level in 8hr.  Vernard Gambles, PharmD, BCPS  10/14/2014,4:25 AM

## 2014-10-14 NOTE — Progress Notes (Signed)
ANTICOAGULATION CONSULT NOTE   Pharmacy Consult for heparin and coumadin Indication: atrial fibrillation  No Known Allergies  Patient Measurements: Height: 5\' 11"  (180.3 cm) Weight: 153 lb 14.1 oz (69.8 kg) IBW/kg (Calculated) : 75.3  Heparin Wt 72.2 kg   Vital Signs: Temp: 98.2 F (36.8 C) (06/21 1725) Temp Source: Oral (06/21 1725) BP: 139/53 mmHg (06/21 1725) Pulse Rate: 65 (06/21 1725)  Labs:  Recent Labs  10/12/14 1108  10/13/14 0607 10/13/14 1802 10/14/14 0328 10/14/14 0828 10/14/14 1618  HGB 8.3*  --  8.3*  --  8.3*  --   --   HCT 25.9*  --  26.1*  --  25.3*  --   --   PLT 121*  --  128*  --  125*  --   --   LABPROT 16.1*  --  17.5*  --  19.6*  --   --   INR 1.28  --  1.43  --  1.66*  --   --   HEPARINUNFRC 0.15*  < > 0.21* 0.74* 0.84*  --  0.88*  CREATININE  --   --   --   --   --  5.54*  --   < > = values in this interval not displayed.  Estimated Creatinine Clearance: 15.7 mL/min (by C-G formula based on Cr of 5.54).  Assessment: 49 YOM with new onset afib, on heparin bridge to coumadin. Heparin level remains supratherapeutic this morning and rate has been reduced to 2000 units/hr, INR 1.66, trending up, missed coumadin dose 6/19. Hgb low stable at 8.3 and Plt 125 K. No overt s/sx bleeding reported. On amio 400 mg bid. In HD this morning, and completed at 1230, didn't receive additional heparin.  Heparin level this afternoon is still above goal at 0.88. No bleeding issues noted, heparin level should be accurate given that dialysis was this finished around noon.  Goal of Therapy:  Heparin level 0.3-0.7 units/ml Monitor platelets by anticoagulation protocol: Yes   Plan:  - Decrease heparin to 1800 units/hr - Coumadin already given - Recheck HL in 8 hours - Daily INR, HL, CBC - Monitor for signs and symptoms of bleeding  Sheppard Coil PharmD., BCPS Clinical Pharmacist Pager [phone] 10/14/2014 5:49 PM

## 2014-10-14 NOTE — Plan of Care (Signed)
Problem: Phase I Progression Outcomes Goal: Up in chair Outcome: Progressing Patient got OOB to recliner chair with 2 person assist with sliding board. Pt sat up in chair for 1 hour and 30 mins before complaining of severe pain in R stump and buttocks and wanting to return to bed. Pt had cushion in chair to reduce pressure on sacral pressure ulcer. Pt also placed on low air loss mattress. Will continue to monitor pt.   [name]

## 2014-10-14 NOTE — Progress Notes (Signed)
LCSW following patient for disposition. Plan is SNF: [facility] at DC.  Patient at this time is not sitting in a chair for dialysis and to be dc to SNF he has to be able to sit in a chair for the entire 6 hours.  RN staff aware and working with patient.  Once medically stable and able to complete dialysis, patient to go to SNF.  Will continue to follow.  Deretha Emory, MSW Clinical Social Work: Emergency Room [phone]

## 2014-10-14 NOTE — Procedures (Signed)
I have personally attended this patient's dialysis session.  2K2.25 Ca bath BP 140's Goal 4+ liters (would like to get post wt to 70...) Using TDC BFR 400 no issues Getting no heparin since on systemic right now Only issue is stump pain  [name], MD [facility] [phone] Pager 10/14/2014, 10:09 AM

## 2014-10-14 NOTE — Consult Note (Addendum)
WOC wound consult note Reason for Consult: Consult requested for sacral wound.  This was documented to be a stage 3 on 6/7 in the nursing flow sheet.  Pt states he used "Bag balm" on it at home and it was present prior to admission. Wound type: Wound has evolved into unstageable at this time Pressure Ulcer POA: Yes Measurement: 1.5X1X.1cm Wound bed: 10% red, 90% yellow slough Drainage (amount, consistency, odor) Small amt yellow drainage, no odor  Periwound: Intact skin surrounding Dressing procedure/placement/frequency: Santyl ointment to chemically debride nonviable tissue.  Discussed plan of care with patient but he does not appear to understand and is very restless, will not allow pillow to be tucked underneath to reduce pressure to affected area. Please re-consult if further assistance is needed.  Thank-you,  Cammie Mcgee MSN, RN, CWOCN, [place], CNS [phone]

## 2014-10-14 NOTE — Progress Notes (Signed)
[facility] PROGRESS NOTE  [name] ZOX:[id number] DOB: 1966/03/09 DOA: 09/24/2014 PCP: [name], NP  Assessment/Plan:  Metabolic Encephalopathy resolved Patient presented with metabolic encephalopathy likely from uremia worsening cardiac function and sedating medications. At this time the metabolic insult. As this time it is resolved and patient is at baseline.  New ESRD on chronic renal failure  Patient's previous creatinine 5.85 on 1117/15, was seen by nephrology and started on hemodialysis via the right chest hemodialysis catheter.POD # 1 Left AV fistula Brachial/cephalic  ready to use in 12 weeks  Episode of monomorphic VT / wide complex tachycardia  Patient had episode of wide complex tachycardia in the hospital which required cardioversion on 09/29/2014. Currently patient is on oral amiodarone as recommended by the EP. We'll continue to monitor the patient on telemetry   Acute onset Afib 6/6 Patient acute onset A. fib, chads 2vasc  score is 3, patient is currently on IV heparin. Patient started on coumadin per pharmacy.  PAD - R LE ischemia/pain  Now s/p R AKA - Vasc Surgery following -  Will discontinue morphine and start Oxycodone 10 mg po q 4 hr prn Dose of oxycontin was increased to 20 mg po q12 hr, also on dilaudid 1 mg Iv q 2 hr prn Will also add gabapentin 300 milligrams by mouth 3 times a day for neuropathic pain  Bilateral lower extremity cellulitis w/ sepsis due to unspecified organism Patient presented with bilateral lower extremity cellulitis and was started on IV antibiotic switch were discontinued yesterday. Patient continues to be afebrile.  Jaundice  total bilirubin fluctuating - acute Hepatitis panel negative - RPR negative - anti-nuclear antibody negative - HIV negative -Hepatic ultrasound revealed normal appearing parenchyma with ascites (likely due to ESRD)   CAD s/p CABG 2010 Chronic Systolic CHF / Cardiomyopathy  -TTE 02/21/14 showed EF  20-25% with grade 3 diastolic dysfunction.  -seen by Dr. Royann Shivers on 08/07/14 w/ decision regarding cardiac device therapy depending heavily on his decision regarding dialysis - ICD was to be considered if he started dialysis. -6/3 TTE shows cardiomyopathy with worsening EF now at 20%  -EP does not feel he is presently a candidate for aggressive procedures -Volume management per hemodialysis  HTN Blood pressures currently reasonably controlled   Hyperlipidemia  Lipid panel abnormal  DM type-II  -6/3 A1c 6.4 - CBG currently controlled   Code Status: Full code Family Communication: No family at bedside Disposition Plan: Skilled nursing facility   Consultants: Nephrology Vasc Surgery EP  Procedures:  Echocardiogram- EF 20%, mild LVH  Antibiotics: Vancomycin 6 /1 > 6/15 Zosyn 6/1 > 6/15  HPI/Subjective: 49 y.o. M Hx hypertension, hyperlipidemia, diabetes mellitus, GERD, CAD S/P CABG 2010, combined systolic and diastolic congestive heart failure (EF 20-25% with grade 3 diastolic dysfunction), chronic kidney disease stage IV, severe PAD, and substance abuse who presented with right leg swelling and pain for several days. His legs became red from foot to upper thigh medially. He did not have fever or chills. Patient was unable to ambulate secondary to pain.   Patient says that the pain is better with Dilaudid and OxyContin.  Objective: Filed Vitals:   10/14/14 1337  BP: 130/54  Pulse: 69  Temp: 98.4 F (36.9 C)  Resp: 15    Intake/Output Summary (Last 24 hours) at 10/14/14 1501 Last data filed at 10/14/14 1415  Gross per 24 hour  Intake    240 ml  Output    102 ml  Net    138  ml   Filed Weights   10/12/14 2106 10/13/14 0500 10/14/14 0803  Weight: 73.211 kg (161 lb 6.4 oz) 73.21 kg (161 lb 6.4 oz) 75.2 kg (165 lb 12.6 oz)    Exam:   General:  Appears in no acute distress  Cardiovascular: S1-S2 is regular, no murmurs rubs or gallops  Respiratory: Clear to  auscultation bilaterally, no wheezing or crackles  Abdomen: Soft, nontender, no organomegaly  Musculoskeletal: Status post right AKA, right stump in dressing   Data Reviewed: Basic Metabolic Panel:  Recent Labs Lab 10/08/14 0002 10/08/14 0901 10/09/14 0826 10/10/14 0507 10/11/14 1408 10/14/14 0828  NA 132* 132* 131* 134* 132* 131*  K 4.2 3.7 3.6 4.0 4.2 4.3  CL 94* 96* 92* 98* 97* 95*  CO2 21* 21* 22 24 23 24   GLUCOSE 138* 136* 121* 157* 175* 110*  BUN 67* 31* 39* 25* 36* 44*  CREATININE 6.12* 3.66* 4.55* 3.61* 5.00* 5.54*  CALCIUM 8.3* 8.3* 8.3* 8.5* 8.5* 9.0  MG  --  1.8  --   --   --   --   PHOS 6.7*  --  5.6*  --   --  4.9*   Liver Function Tests:  Recent Labs Lab 10/08/14 0002 10/08/14 0901 10/09/14 0826 10/14/14 0828  AST  --  19  --   --   ALT  --  13*  --   --   ALKPHOS  --  95  --   --   BILITOT  --  2.6*  --   --   PROT  --  5.7*  --   --   ALBUMIN 1.9* 2.1* 2.0* 2.2*   No results for input(s): LIPASE, AMYLASE in the last 168 hours. No results for input(s): AMMONIA in the last 168 hours. CBC:  Recent Labs Lab 10/08/14 0901  10/10/14 0507 10/11/14 1408 10/12/14 1108 10/13/14 0607 10/14/14 0328  WBC 6.5  < > 7.1 6.9 7.3 8.1 9.4  NEUTROABS 5.2  --   --   --   --   --   --   HGB 10.1*  < > 9.5* 8.5* 8.3* 8.3* 8.3*  HCT 30.7*  < > 29.1* 26.2* 25.9* 26.1* 25.3*  MCV 85.3  < > 87.1 86.8 87.8 88.5 88.5  PLT 128*  < > 139* 128* 121* 128* 125*  < > = values in this interval not displayed. Cardiac Enzymes: No results for input(s): CKTOTAL, CKMB, CKMBINDEX, TROPONINI in the last 168 hours. BNP (last 3 results)  Recent Labs  09/25/14 0410  BNP 2227.0*    ProBNP (last 3 results) No results for input(s): PROBNP in the last 8760 hours.  CBG:  Recent Labs Lab 10/13/14 1745 10/13/14 2015 10/14/14 0607 10/14/14 0730 10/14/14 1316  GLUCAP 128* 114* 94 103* 105*    Recent Results (from the past 240 hour(s))  Clostridium Difficile by PCR  (not at [facility])     Status: Abnormal   Collection Time: 10/09/14  4:33 AM  Result Value Ref Range Status   C difficile by pcr POSITIVE (A) NEGATIVE Final    Comment: CRITICAL RESULT CALLED TO, READ BACK BY AND VERIFIED WITH: RN HAYWOOD,K AT [phone] [name]      Studies: No results found.  Scheduled Meds: . amiodarone  400 mg Oral BID  . calcium acetate  1,334 mg Oral TID WC  . collagenase   Topical Daily  . Darbepoetin Alfa      . darbepoetin (ARANESP) injection - DIALYSIS  100 mcg Intravenous Q Tue-HD  . feeding supplement (NEPRO CARB STEADY)  237 mL Oral BID BM  . feeding supplement (PRO-STAT SUGAR FREE 64)  30 mL Oral BID  . folic acid  1 mg Oral Daily  . insulin aspart  0-9 Units Subcutaneous TID WC  . metoprolol tartrate  25 mg Oral BID  . multivitamin  1 tablet Oral QHS  . OxyCODONE  20 mg Oral Q12H  . pantoprazole  40 mg Oral Q1200  . polyethylene glycol  17 g Oral BID  . senna-docusate  1 tablet Oral BID  . sodium chloride  3 mL Intravenous Q12H  . thiamine  100 mg Oral Daily  . vancomycin  125 mg Oral QID  . warfarin  5 mg Oral ONCE-1800  . Warfarin - Pharmacist Dosing Inpatient   Does not apply q1800   Continuous Infusions: . sodium chloride 25 mL/hr (10/10/14 0851)  . sodium chloride    . heparin 2,000 Units/hr (10/14/14 0423)    Principal Problem:   Bilateral lower leg cellulitis Active Problems:   S/P CABG x 3:  March 2010: left internal mammary artery to distal left anterior descending, SVG to Circ marginal vessel, SVG to PDA   HTN (hypertension)   Hyperlipidemia   DM2 (diabetes mellitus, type 2)   Acute renal failure superimposed on stage 4 chronic kidney disease   CAD (coronary artery disease)   NSTEMI (non-ST elevated myocardial infarction)   PAD (peripheral artery disease)   GERD (gastroesophageal reflux disease)   Combined systolic and diastolic congestive heart failure   Cellulitis of right leg   Sepsis   Cellulitis   Constipation    Blood poisoning   Jaundice   Peripheral artery disease   CAD in native artery   Essential hypertension   HLD (hyperlipidemia)   Diabetes type 2, uncontrolled   Acute on chronic renal failure   Chronic combined systolic and diastolic CHF (congestive heart failure)   Metabolic encephalopathy   Cellulitis of leg, right   Cellulitis of leg, left   Chronic systolic heart failure   Cardiomyopathy   Hypokalemia   Systolic CHF, chronic   Chronic systolic CHF (congestive heart failure)   Diabetes type 2, controlled   Protein-calorie malnutrition, severe   Sepsis affecting skin   Gangrene of foot   Pressure ulcer   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 If 7PM-7AM, please contact night-coverage at [email or web address], password [facility] 10/14/2014, 3:01 PM  LOS: 20 days

## 2014-10-14 NOTE — Progress Notes (Signed)
ANTICOAGULATION CONSULT NOTE - Follow Up Consult  Pharmacy Consult for heparin and coumadin Indication: atrial fibrillation  No Known Allergies  Patient Measurements: Height: 5\' 11"  (180.3 cm) Weight: 165 lb 12.6 oz (75.2 kg) IBW/kg (Calculated) : 75.3  Heparin Wt 72.2 kg   Vital Signs: Temp: 98.4 F (36.9 C) (06/21 1337) Temp Source: Oral (06/21 1337) BP: 130/54 mmHg (06/21 1337) Pulse Rate: 69 (06/21 1337)  Labs:  Recent Labs  10/12/14 1108  10/13/14 0607 10/13/14 1802 10/14/14 0328 10/14/14 0828  HGB 8.3*  --  8.3*  --  8.3*  --   HCT 25.9*  --  26.1*  --  25.3*  --   PLT 121*  --  128*  --  125*  --   LABPROT 16.1*  --  17.5*  --  19.6*  --   INR 1.28  --  1.43  --  1.66*  --   HEPARINUNFRC 0.15*  < > 0.21* 0.74* 0.84*  --   CREATININE  --   --   --   --   --  5.54*  < > = values in this interval not displayed.  Estimated Creatinine Clearance: 17 mL/min (by C-G formula based on Cr of 5.54).  Assessment: 49 YOM with new onset afib, on heparin bridge to coumadin. Heparin level remains supratherapeutic this morning and rate has been reduced to 2000 units/hr, INR 1.66, trending up, missed coumadin dose 6/19. Hgb low stable at 8.3 and Plt 125 K. No overt s/sx bleeding reported. On amio 400 mg bid. In HD this morning, and completed at 1230, didn't receive additional heparin.  Goal of Therapy:  Heparin level 0.3-0.7 units/ml Monitor platelets by anticoagulation protocol: Yes   Plan:  - Move heparin level to 1630 (4 hrs after HD) - Coumadin 5 mg x 1 tonight - Daily INR, HL, CBC - Monitor for signs and symptoms of bleeding  Bayard Hugger, PharmD, BCPS  Clinical Pharmacist  Pager: [phone]   10/14/2014 2:27 PM

## 2014-10-15 DIAGNOSIS — N186 End stage renal disease: Secondary | ICD-10-CM

## 2014-10-15 DIAGNOSIS — Z951 Presence of aortocoronary bypass graft: Secondary | ICD-10-CM

## 2014-10-15 DIAGNOSIS — E43 Unspecified severe protein-calorie malnutrition: Secondary | ICD-10-CM

## 2014-10-15 DIAGNOSIS — Z7901 Long term (current) use of anticoagulants: Secondary | ICD-10-CM

## 2014-10-15 DIAGNOSIS — G9341 Metabolic encephalopathy: Secondary | ICD-10-CM

## 2014-10-15 DIAGNOSIS — Z992 Dependence on renal dialysis: Secondary | ICD-10-CM

## 2014-10-15 DIAGNOSIS — I480 Paroxysmal atrial fibrillation: Secondary | ICD-10-CM

## 2014-10-15 DIAGNOSIS — A047 Enterocolitis due to Clostridium difficile: Secondary | ICD-10-CM

## 2014-10-15 DIAGNOSIS — I5042 Chronic combined systolic (congestive) and diastolic (congestive) heart failure: Secondary | ICD-10-CM

## 2014-10-15 LAB — CBC
HCT: 26.5 % — ABNORMAL LOW (ref 39.0–52.0)
Hemoglobin: 8.4 g/dL — ABNORMAL LOW (ref 13.0–17.0)
MCH: 28.5 pg (ref 26.0–34.0)
MCHC: 31.7 g/dL (ref 30.0–36.0)
MCV: 89.8 fL (ref 78.0–100.0)
PLATELETS: 118 10*3/uL — AB (ref 150–400)
RBC: 2.95 MIL/uL — ABNORMAL LOW (ref 4.22–5.81)
RDW: 17.8 % — AB (ref 11.5–15.5)
WBC: 9.7 10*3/uL (ref 4.0–10.5)

## 2014-10-15 LAB — RENAL FUNCTION PANEL
Albumin: 2.2 g/dL — ABNORMAL LOW (ref 3.5–5.0)
Anion gap: 15 (ref 5–15)
BUN: 23 mg/dL — ABNORMAL HIGH (ref 6–20)
CO2: 23 mmol/L (ref 22–32)
CREATININE: 3.62 mg/dL — AB (ref 0.61–1.24)
Calcium: 8.7 mg/dL — ABNORMAL LOW (ref 8.9–10.3)
Chloride: 96 mmol/L — ABNORMAL LOW (ref 101–111)
GFR calc non Af Amer: 18 mL/min — ABNORMAL LOW (ref >60)
GFR, EST AFRICAN AMERICAN: 21 mL/min — AB (ref >60)
Glucose, Bld: 137 mg/dL — ABNORMAL HIGH (ref 65–99)
PHOSPHORUS: 3.6 mg/dL (ref 2.5–4.6)
POTASSIUM: 3.6 mmol/L (ref 3.5–5.1)
Sodium: 134 mmol/L — ABNORMAL LOW (ref 135–145)

## 2014-10-15 LAB — GLUCOSE, CAPILLARY
GLUCOSE-CAPILLARY: 89 mg/dL (ref 65–99)
Glucose-Capillary: 104 mg/dL — ABNORMAL HIGH (ref 65–99)
Glucose-Capillary: 153 mg/dL — ABNORMAL HIGH (ref 65–99)
Glucose-Capillary: 133 mg/dL — ABNORMAL HIGH (ref 65–99)

## 2014-10-15 LAB — PROTIME-INR
INR: 1.98 — ABNORMAL HIGH (ref 0.00–1.49)
Prothrombin Time: 22.4 seconds — ABNORMAL HIGH (ref 11.6–15.2)

## 2014-10-15 LAB — HEPARIN LEVEL (UNFRACTIONATED)
Heparin Unfractionated: 0.46 IU/mL (ref 0.30–0.70)
Heparin Unfractionated: 0.52 IU/mL (ref 0.30–0.70)

## 2014-10-15 MED ORDER — WARFARIN SODIUM 4 MG PO TABS
4.0000 mg | ORAL_TABLET | Freq: Once | ORAL | Status: AC
  Administered 2014-10-15: 4 mg via ORAL
  Filled 2014-10-15: qty 1

## 2014-10-15 NOTE — Progress Notes (Signed)
ANTICOAGULATION CONSULT NOTE - Follow Up Consult  Pharmacy Consult for heparin Indication: atrial fibrillation   Labs:  Recent Labs  10/13/14 0607  10/14/14 0328 10/14/14 0828 10/14/14 1618 10/15/14 0137  HGB 8.3*  --  8.3*  --   --  8.4*  HCT 26.1*  --  25.3*  --   --  26.5*  PLT 128*  --  125*  --   --  118*  LABPROT 17.5*  --  19.6*  --   --  22.4*  INR 1.43  --  1.66*  --   --  1.98*  HEPARINUNFRC 0.21*  < > 0.84*  --  0.88* 0.46  CREATININE  --   --   --  5.54*  --   --   < > = values in this interval not displayed.    Assessment/Plan:  49yo male therapeutic on heparin after rate decreases. Will continue gtt at current rate and confirm stable with additional level.   Vernard Gambles, PharmD, BCPS  10/15/2014,2:18 AM

## 2014-10-15 NOTE — Progress Notes (Signed)
Pt transferred OOB to recliner using sliding board with 2 person assist. Pt sat in recliner for 8 hours with cushion in chair and frequent, Q1hr, repositioning. Pt encouraged to attend dialysis session in the chair for treatment tomorrow. Pt verbalized understanding and was willing to do so. Will continue to monitor pt.   Jonell Cluck, RN

## 2014-10-15 NOTE — Clinical Social Work Note (Signed)
Clinical Social Worker continuing to follow patient and family for support and discharge planning needs.  Per RN, patient sat for an hour and half yesterday and plan is for three hours today.  Patient and RN focused on having patient sit for full dialysis session on 06/23.  Plan is for patient to go to [facility] at discharge.  CSW remains available for support and to facilitate patient discharge needs once appropriate.  Macario Golds, [place] [phone]

## 2014-10-15 NOTE — Progress Notes (Signed)
[facility] Rounding Note  Subjective:  No new events Appears will be going to [facility] SNF (his CLIP is still pending) Will get HD in the chair tomorrow (has been sitting up in the chair since 7AM today - is now 1:30 PM)  06/21 0701 - 06/22 0700 In: 580 [P.O.:580] Out: 101 [Urine:100; Stool:1]  Filed Weights   10/13/14 0500 10/14/14 0803 10/14/14 1224  Weight: 73.21 kg (161 lb 6.4 oz) 75.2 kg (165 lb 12.6 oz) 69.8 kg (153 lb 14.1 oz)   Physical Exam:  Blood pressure 130/60, pulse 66, temperature 98.1 F (36.7 C), temperature source Oral, resp. rate 17, height 5\' 11"  (1.803 m), weight 73.21 kg (161 lb 6.4 oz), SpO2 99 %. NAD, mother in the room with him. Up in the chair and just worked with PT Multiple tattoos S1S2 No S3 Lungs are clear Left upper arm AVF + bruit and thrill Right IJ TDC in use - BFR 400 Abd non tender. + abdominal wall edema but less Right AKA stump wrapped + edema of stump above the wrap with edema to the hip Now just 1+ edema LLE.   Scheduled Meds: . amiodarone  400 mg Oral BID  . calcium acetate  1,334 mg Oral TID WC  . collagenase   Topical Daily  . darbepoetin (ARANESP) injection - DIALYSIS  100 mcg Intravenous Q Tue-HD  . feeding supplement (NEPRO CARB STEADY)  237 mL Oral BID BM  . feeding supplement (PRO-STAT SUGAR FREE 64)  30 mL Oral BID  . folic acid  1 mg Oral Daily  . gabapentin  300 mg Oral TID  . insulin aspart  0-9 Units Subcutaneous TID WC  . metoprolol tartrate  25 mg Oral BID  . multivitamin  1 tablet Oral QHS  . OxyCODONE  20 mg Oral Q12H  . pantoprazole  40 mg Oral Q1200  . polyethylene glycol  17 g Oral BID  . senna-docusate  1 tablet Oral BID  . sodium chloride  3 mL Intravenous Q12H  . thiamine  100 mg Oral Daily  . vancomycin  125 mg Oral QID  . Warfarin - Pharmacist Dosing Inpatient   Does not apply q1800   Continuous Infusions: . sodium chloride 25 mL/hr (10/10/14 0851)  . sodium chloride    .  heparin 1,800 Units/hr (10/15/14 0723)   PRN Meds:.acetaminophen, HYDROmorphone (DILAUDID) injection, nitroGLYCERIN, ondansetron (ZOFRAN) IV   Recent Labs Lab 10/09/14 0826  10/11/14 1408 10/14/14 0828 10/15/14 0137  NA 131*  < > 132* 131* 134*  K 3.6  < > 4.2 4.3 3.6  CL 92*  < > 97* 95* 96*  CO2 22  < > 23 24 23   GLUCOSE 121*  < > 175* 110* 137*  BUN 39*  < > 36* 44* 23*  CREATININE 4.55*  < > 5.00* 5.54* 3.62*  CALCIUM 8.3*  < > 8.5* 9.0 8.7*  PHOS 5.6*  --   --  4.9* 3.6  < > = values in this interval not displayed.  Recent Labs Lab 10/13/14 0607 10/14/14 0328 10/15/14 0137  WBC 8.1 9.4 9.7  HGB 8.3* 8.3* 8.4*  HCT 26.1* 25.3* 26.5*  MCV 88.5 88.5 89.8  PLT 128* 125* 118*      Background: 49 yo prev followed by [facility] for baseline CKD4/5  with background of DM, HTN, GERD, CAD with prior CABG 2010, combined systolic and diastolic CHF (EF 22-57% with grade 3 diastolic dysfunction) severe PAD, substance  use. Admitted with leg pain and swelling, s/p  AKA RLE 10/06/14. Creatinine on admission 7's, BUN 140's. Deemed new ESRD and HD initiated 09/28/14.  A/P 1. New ESRD 1. Started 6/5 via left fem cath. R IJ TDC placed 6/8. 2. Cont on THS Schedule 3. S/p L BC AVF 6/17 VVS Early - nice bruit and thrill 4. CLIP in process; now that SNF, will need to finalize - still pending at this time (possibly [facility] - awaiting word. Has to prove can HD in a chair - I think he will be able to)) 5. 4h, on systemic heparin (--> PO Warfarin) 6. Will minimally need sliding board and wheelchair for transfers - appears per PT Michiel Sites not needed at this time 2. HTN/Vol 1. Post weight goal 70kg yesterday - got to 69.8 kg.  Will try for 69 kg tomorrow. 2. BP stable only on MTP 3. Anemia 1. Aranesp 100 qTues 2. Rec'd Feraheme as well 4. PAD and ischemic RLE, 1. 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 CKD-BMD 1. PTH = 97 on 09/29/14 No VDRA needed 2. PhosLo 2ac has normalized phosphorus 6. AMS,  improved 7. C Diff colitis: PO Vanc. End date 10/23/14 8. Episode of VT req cardioversion 6/6, on amio 9. AFib on anticoagulation (heparin->warfarin per pharmacy) He will need to have INR managed by Cardiology at discharge - just thinking ahead so these arrangements can be made 10. Systolic (EF 20-25) and diastolic (Gr 3)  HF: cont to lower EDW as tolerated 11. CAD s/p CABG (2010) 12. DM2 - SSI  [name], MD [facility] [phone] Pager 10/15/2014, 1:28 PM

## 2014-10-15 NOTE — Progress Notes (Signed)
Physical Therapy Treatment Patient Details Name: [name] MRN: [id number] DOB: [birth year] Today's Date: 10/15/2014    History of Present Illness Pt admitted with RLE cellulitis. PMHx of HTN, HLD, DM, GERD, CHF, CKD, polysubstance abuse. 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    PT Comments    Continuing progress with mobility; Able to work on standing balance and tolerance today, in prep for more dynamic, stand pivot motions;   I anticipate he will be able to tolerate HD in chair well tomorrow   Follow Up Recommendations  SNF     Equipment Recommendations  Wheelchair (measurements PT);Wheelchair cushion (measurements PT) (sliding board)    Recommendations for Other Services       Precautions / Restrictions Precautions Precautions: Fall    Mobility  Bed Mobility               General bed mobility comments: OOB in recliner  Transfers Overall transfer level: Needs assistance Equipment used: Rolling walker (2 wheeled) Transfers: Sit to/from Stand Sit to Stand: Mod assist;+2 physical assistance         General transfer comment: Cues for technqiue, hand placement, and for optimal posture in standing; Step-by step cues for controlling descent to chair, which proved to be the most difficult task  Ambulation/Gait                 Stairs            Wheelchair Mobility    Modified Rankin (Stroke Patients Only)       Balance     Sitting balance-Leahy Scale: Fair       Standing balance-Leahy Scale: Poor Standing balance comment: Dependent on UE support; stood in RW 2x ; on second rep, able to work on "toe-heel pivot" in prep for stand pivot transfers                    Cognition Arousal/Alertness: Awake/alert Behavior During Therapy: WFL for tasks assessed/performed Overall Cognitive Status: Within Functional Limits for tasks assessed                      Exercises  Other Exercises Other Exercises: Chair push ups x5     General  Comments General comments (skin integrity, edema, etc.): pt's kerlix and ACE wrap fell off during transfer training; re-wrapped residual limb, and asked Shea Stakes, RN to check my work      Pertinent Vitals/Pain Pain Assessment: 0-10 Pain Score: 8  Faces Pain Scale: Hurts little more Pain Location: General soreness Pain Descriptors / Indicators: Aching Pain Intervention(s): Limited activity within patient's tolerance;Monitored during session;Premedicated before session;Repositioned    Home Living                      Prior Function            PT Goals (current goals can now be found in the care plan section) Acute Rehab PT Goals Patient Stated Goal: get stronger PT Goal Formulation: With patient Time For Goal Achievement: 10/23/14 Potential to Achieve Goals: Fair Progress towards PT goals: Progressing toward goals    Frequency  Min 3X/week    PT Plan Current plan remains appropriate    Co-evaluation             End of Session Equipment Utilized During Treatment: Gait belt Activity Tolerance: Patient tolerated treatment well Patient left: in chair;with call bell/phone within reach     Time: 4696-2952 PT Time Calculation (min) (ACUTE  ONLY): 29 min  Charges:  $Therapeutic Activity: 23-37 mins                    G Codes:      Olen Pel 10/15/2014, 1:48 PM  Van Clines, [place]  Acute Rehabilitation Services Pager [phone] Office [phone]

## 2014-10-15 NOTE — Progress Notes (Signed)
10/15/2014 3:14 PM Hemodialysis  Outpatient Note; this patient has been accepted at the [facility] center, I have not received a schedule as of this note. Thank you. Tilman Neat

## 2014-10-15 NOTE — Progress Notes (Signed)
[facility] PROGRESS NOTE  [name] SUP:[id number] DOB: August 08, 1965 DOA: 09/24/2014 PCP: [name] Severin, NP  Assessment/Plan:  Metabolic Encephalopathy resolved Patient presented with metabolic encephalopathy likely from uremia worsening cardiac function and sedating medications. . As this time it is resolved and patient is at baseline.  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 Currently patient is in sinus rhythm and  on oral amiodarone as recommended by the EP. We'll continue to monitor the patient on telemetry  On coumadin dose per pharmacy. Will need to set up f/u with cardiology to monitor INR  Acute onset Afib 6/6 Patient acute onset A. fib, chads 2vasc  score is 3, patient is currently on IV heparin. Patient started on coumadin per pharmacy.  PAD - R LE ischemia/pain  Now s/p R AKA - Vasc Surgery following -  Will discontinue morphine and start Oxycodone 10 mg po q 4 hr prn Dose of oxycontin was increased to 20 mg po q12 hr, also on dilaudid 1 mg Iv q 2 hr prn Will also add gabapentin 300 milligrams by mouth 3 times a day for neuropathic pain  Bilateral lower extremity cellulitis w/ sepsis due to unspecified organism Patient presented with bilateral lower extremity cellulitis and was started on IV antibiotic switch were discontinued yesterday. Patient continues to be afebrile.  Jaundice  total bilirubin fluctuating - acute Hepatitis panel negative - RPR negative - anti-nuclear antibody negative - HIV negative -Hepatic ultrasound revealed normal appearing parenchyma with ascites (likely due to ESRD)   CAD  s/p CABG 2010 Chronic Systolic CHF / Cardiomyopathy  -TTE 02/21/14 showed EF 20-25% with grade 3 diastolic dysfunction.  -seen by Dr. Royann Shivers on 08/07/14 w/ decision regarding cardiac device therapy depending heavily on his decision regarding dialysis - ICD was to be considered if he started dialysis. -6/3 TTE shows cardiomyopathy with worsening EF now at 20%  -EP does not feel he is presently a candidate for aggressive procedures -Volume management per hemodialysis  HTN Blood pressures currently reasonably controlled   Hyperlipidemia  Lipid panel abnormal  DM type-II  -6/3 A1c 6.4 - CBG currently controlled   cdiff on oral vanc, reported diarrhea has stopped, last bm last night  Code Status: Full code Family Communication: No family at bedside Disposition Plan: Skilled nursing facility   Consultants: Nephrology Vasc Surgery EP  Procedures:  Echocardiogram- EF 20%, mild LVH  Antibiotics: Vancomycin 6 /1 > 6/15 Zosyn 6/1 > 6/15 Oral vanc from 6/16.  HPI/Subjective: 49 y.o. M Hx hypertension, hyperlipidemia, diabetes mellitus, GERD, CAD S/P CABG 2010, combined systolic and diastolic congestive heart failure (EF 20-25% with grade 3 diastolic dysfunction), chronic kidney disease stage IV, severe PAD, and substance abuse who presented with right leg swelling and pain for several days. His legs became red from foot to upper thigh medially. He did not have fever or chills. Patient was unable to ambulate secondary to pain.   Patient says that the pain is better, diarrhea has stopped, able to sit during HD.  Objective: Filed Vitals:   10/15/14 1746  BP: 114/48  Pulse: 64  Temp: 97.8 F (36.6 C)  Resp: 17    Intake/Output Summary (  Last 24 hours) at 10/15/14 1945 Last data filed at 10/15/14 1918  Gross per 24 hour  Intake   1040 ml  Output      0 ml  Net   1040 ml   Filed Weights   10/13/14 0500 10/14/14 0803 10/14/14 1224  Weight: 73.21 kg (161 lb 6.4 oz) 75.2  kg (165 lb 12.6 oz) 69.8 kg (153 lb 14.1 oz)    Exam:   General:  Appears in no acute distress, chronically ill  Cardiovascular: S1-S2 is regular, no murmurs rubs or gallops  Respiratory: Clear to auscultation bilaterally, no wheezing or crackles  Abdomen: Soft, nontender, no organomegaly  Musculoskeletal: Status post right AKA, right stump in dressing   Data Reviewed: Basic Metabolic Panel:  Recent Labs Lab 10/09/14 0826 10/10/14 0507 10/11/14 1408 10/14/14 0828 10/15/14 0137  NA 131* 134* 132* 131* 134*  K 3.6 4.0 4.2 4.3 3.6  CL 92* 98* 97* 95* 96*  CO2 GLUCOSE 121* 157* 175* 110* 137*  BUN 39* 25* 36* 44* 23*  CREATININE 4.55* 3.61* 5.00* 5.54* 3.62*  CALCIUM 8.3* 8.5* 8.5* 9.0 8.7*  PHOS 5.6*  --   --  4.9* 3.6   Liver Function Tests:  Recent Labs Lab 10/09/14 0826 10/14/14 0828 10/15/14 0137  ALBUMIN 2.0* 2.2* 2.2*   No results for input(s): LIPASE, AMYLASE in the last 168 hours. No results for input(s): AMMONIA in the last 168 hours. CBC:  Recent Labs Lab 10/11/14 1408 10/12/14 1108 10/13/14 0607 10/14/14 0328 10/15/14 0137  WBC 6.9 7.3 8.1 9.4 9.7  HGB 8.5* 8.3* 8.3* 8.3* 8.4*  HCT 26.2* 25.9* 26.1* 25.3* 26.5*  MCV 86.8 87.8 88.5 88.5 89.8  PLT 128* 121* 128* 125* 118*   Cardiac Enzymes: No results for input(s): CKTOTAL, CKMB, CKMBINDEX, TROPONINI in the last 168 hours. BNP (last 3 results)  Recent Labs  09/25/14 0410  BNP 2227.0*    ProBNP (last 3 results) No results for input(s): PROBNP in the last 8760 hours.  CBG:  Recent Labs Lab 10/14/14 1713 10/14/14 2138 10/15/14 0728 10/15/14 1153 10/15/14 1736  GLUCAP 136* 114* 104* 153* 89    Recent Results (from the past 240 hour(s))  Clostridium Difficile by PCR (not at [facility])     Status: Abnormal   Collection Time: 10/09/14  4:33 AM  Result Value Ref Range Status   C difficile by pcr POSITIVE (A) NEGATIVE Final    Comment: CRITICAL RESULT CALLED TO, READ  BACK BY AND VERIFIED WITH: RN HAYWOOD,K AT 1610 [id number] [name]      Studies: No results found.  Scheduled Meds: . amiodarone  400 mg Oral BID  . calcium acetate  1,334 mg Oral TID WC  . collagenase   Topical Daily  . darbepoetin (ARANESP) injection - DIALYSIS  100 mcg Intravenous Q Tue-HD  . feeding supplement (NEPRO CARB STEADY)  237 mL Oral BID BM  . feeding supplement (PRO-STAT SUGAR FREE 64)  30 mL Oral BID  . folic acid  1 mg Oral Daily  . gabapentin  300 mg Oral TID  . insulin aspart  0-9 Units Subcutaneous TID WC  . metoprolol tartrate  25 mg Oral BID  . multivitamin  1 tablet Oral QHS  . OxyCODONE  20 mg Oral Q12H  . pantoprazole  40 mg Oral Q1200  . polyethylene glycol  17 g Oral BID  . senna-docusate  1 tablet Oral BID  . sodium chloride  3  mL Intravenous Q12H  . thiamine  100 mg Oral Daily  . vancomycin  125 mg Oral QID  . Warfarin - Pharmacist Dosing Inpatient   Does not apply q1800   Continuous Infusions: . sodium chloride 25 mL/hr (10/10/14 0851)  . sodium chloride    . heparin 1,800 Units/hr (10/15/14 0723)    Principal Problem:   Bilateral lower leg cellulitis Active Problems:   S/P CABG x 3:  March 2010: left internal mammary artery to distal left anterior descending, SVG to Circ marginal vessel, SVG to PDA   HTN (hypertension)   Hyperlipidemia   DM2 (diabetes mellitus, type 2)   Acute renal failure superimposed on stage 4 chronic kidney disease   CAD (coronary artery disease)   NSTEMI (non-ST elevated myocardial infarction)   PAD (peripheral artery disease)   GERD (gastroesophageal reflux disease)   Combined systolic and diastolic congestive heart failure   Cellulitis of right leg   Sepsis   Cellulitis   Constipation   Blood poisoning   Jaundice   Peripheral artery disease   CAD in native artery   Essential hypertension   HLD (hyperlipidemia)   Diabetes type 2, uncontrolled   Acute on chronic renal failure   Chronic combined systolic and  diastolic CHF (congestive heart failure)   Metabolic encephalopathy   Cellulitis of leg, right   Cellulitis of leg, left   Chronic systolic heart failure   Cardiomyopathy   Hypokalemia   Systolic CHF, chronic   Chronic systolic CHF (congestive heart failure)   Diabetes type 2, controlled   Protein-calorie malnutrition, severe   Sepsis affecting skin   Gangrene of foot   Pressure ulcer   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 If 7PM-7AM, please contact night-coverage at [email or web address], password [facility] 10/15/2014, 7:45 PM  LOS: 21 days

## 2014-10-15 NOTE — Progress Notes (Signed)
Occupational Therapy Treatment Patient Details Name: [name] MRN: [id number] DOB: 03/17/1966 Today's Date: 10/15/2014    History of present illness Pt admitted with RLE cellulitis. PMHx of HTN, HLD, DM, GERD, CHF, CKD, polysubstance abuse. 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   OT comments  Excellent participation. Pt making gains with strength, mobility and independence with ADL. Will benefit from continued skilled OT services to addressed established goals.  Pt discussing coping with R AKA and expressed desire to talk with support person. Made contact to initiate meeting with individual from [facility] (Amputees helping amputees). Will follow.   Follow Up Recommendations  SNF;Supervision/Assistance - 24 hour    Equipment Recommendations  3 in 1 bedside comode;Wheelchair (measurements OT);Wheelchair cushion (measurements OT)    Recommendations for Other Services      Precautions / Restrictions Precautions Precautions: Fall       Mobility Bed Mobility               General bed mobility comments: OOB in recliner  Transfers                      Balance     Sitting balance-Leahy Scale: Fair                             ADL                                         General ADL Comments: Focus of session on lateral weight shifts in preparation for LB ADL and core and BUE strengthening. Discussed need tfor pt to be completing ADL @ bed level at this time. Pt's mother present for sesion and participated in exercises. Reviewed importance of desensitization. Pt verblaized understanding. Pt discussed feelings of loss regarding his RLE and stated he thought it would be beneficial to talk to a support person. Outpt clinic contacted regarding setting up peer support.                                      Cognition   Behavior During Therapy: WFL for tasks assessed/performed Overall Cognitive Status: Within Functional Limits for tasks assessed                       Extremity/Trunk Assessment   general BUE weakness. Decreased scapular glide B.            Exercises General Exercises - Upper Extremity Shoulder Flexion: Both;20 reps;Seated;Theraband Theraband Level (Shoulder Flexion): Level 2 (Red) Shoulder Extension: Both;20 reps;Seated;Theraband Theraband Level (Shoulder Extension): Level 2 (Red) Shoulder ABduction: Both;20 reps;Seated;Theraband Theraband Level (Shoulder Abduction): Level 2 (Red) Elbow Flexion: Both;20 reps;Seated;Theraband Theraband Level (Elbow Flexion): Level 2 (Red) Elbow Extension: Strengthening;Both;20 reps;Theraband Theraband Level (Elbow Extension): Level 2 (Red) Chair Push Up: Both;20 reps;Seated Other Exercises Other Exercises: core strengthening exercises in sitting Other Exercises: sitting balance activities to chanllenge dynamic balace reaching out of BOS  B scapular strengthening ex                Pertinent Vitals/ Pain       Pain Assessment: Faces Faces Pain Scale: Hurts little more Pain Location: general soreness all over Pain Descriptors / Indicators: Sore Pain Intervention(s): Limited activity within patient's tolerance;Monitored during session  Home Living                                          Prior Functioning/Environment              Frequency Min 2X/week     Progress Toward Goals  OT Goals(current goals can now be found in the care plan section)  Progress towards OT goals: Progressing toward goals  Acute Rehab OT Goals Patient Stated Goal: to get stronger and be able to cook again OT Goal Formulation: With patient Time For Goal Achievement: 10/23/14 Potential to Achieve Goals: Good ADL Goals Pt Will Perform Grooming: with set-up;with supervision;sitting Pt Will Perform Upper Body Bathing: with set-up;with supervision;sitting Pt Will Perform Lower Body Bathing: with min assist;sit to/from stand Pt Will Perform Upper Body Dressing:  with set-up;with supervision;sitting Pt Will Perform Lower Body Dressing: with min assist;sit to/from stand Pt Will Transfer to Toilet: with mod assist;squat pivot transfer;stand pivot transfer;bedside commode Pt Will Perform Toileting - Clothing Manipulation and hygiene: with min guard assist;sit to/from stand Additional ADL Goal #1: Pt will be independent in UB exercise program with level 2 theraband.  Plan Discharge plan remains appropriate    Co-evaluation                 End of Session     Activity Tolerance Patient tolerated treatment well   Patient Left in chair;with call bell/phone within reach;with family/visitor present   Nurse Communication Mobility status        Time: 1239-1310 OT Time Calculation (min): 31 min  Charges: OT General Charges $OT Visit: 1 Procedure OT Treatments $Therapeutic Activity: 23-37 mins  Yan Pankratz,HILLARY 10/15/2014, 1:34 PM  [facility], OTR/L  [phone] 10/15/2014

## 2014-10-15 NOTE — Care Management Note (Signed)
Case Management Note  Patient Details  Name: [name] MRN: [id number] Date of Birth: 07/30/1965  Subjective/Objective:           CM following for progression and d/c planning.         Action/Plan: Noted plan for SNF, CLIP still pending, unclear why this is taking so long. Will attempt to clarify with HD unit sec.  Pt with increasing time in chair, hopefully will be able to sit for HD tomorrow and plan to d/c can be finalized.   Expected Discharge Date:                  Expected Discharge Plan:  Skilled Nursing Facility  In-House Referral:  Clinical Social Work  Discharge planning Services  NA  Post Acute Care Choice:  NA Choice offered to:  NA  DME Arranged:    DME Agency:     HH Arranged:    HH Agency:     Status of Service:     Medicare Important Message Given:  Yes Date Medicare IM Given:  09/29/14 Medicare IM give by:  debbie dowell rn,bsn Date Additional Medicare IM Given:  10/13/14 Additional Medicare Important Message give by:  Johny Shock RN MPH, case manager, [phone]  If discussed at Long Length of Stay Meetings, dates discussed:    Additional Comments:  Starlyn Skeans, RN 10/15/2014, 3:09 PM

## 2014-10-15 NOTE — Progress Notes (Addendum)
ANTICOAGULATION CONSULT NOTE - Follow up  Pharmacy Consult for heparin and coumadin Indication: atrial fibrillation  No Known Allergies  Patient Measurements: Height: 5\' 11"  (180.3 cm) Weight: 153 lb 14.1 oz (69.8 kg) IBW/kg (Calculated) : 75.3  Heparin Wt 72.2 kg   Vital Signs: Temp: 97.8 F (36.6 C) (06/22 0914) Temp Source: Oral (06/22 0914) BP: 123/57 mmHg (06/22 0914) Pulse Rate: 62 (06/22 0914)  Labs:  Recent Labs  10/13/14 9563  10/14/14 0328 10/14/14 0828 10/14/14 1618 10/15/14 0137 10/15/14 1033  HGB 8.3*  --  8.3*  --   --  8.4*  --   HCT 26.1*  --  25.3*  --   --  26.5*  --   PLT 128*  --  125*  --   --  118*  --   LABPROT 17.5*  --  19.6*  --   --  22.4*  --   INR 1.43  --  1.66*  --   --  1.98*  --   HEPARINUNFRC 0.21*  < > 0.84*  --  0.88* 0.46 0.52  CREATININE  --   --   --  5.54*  --  3.62*  --   < > = values in this interval not displayed.  Estimated Creatinine Clearance: 24.1 mL/min (by C-G formula based on Cr of 3.62).  Assessment: 49 YOM with new onset afib, on heparin bridge to coumadin. Heparin level this AM is 0.52.  Heparin level remains therapeutic x 2 level checks on IV heparin drip 1800 units/hr. INR = 1.98, trending up from 1.66. Missed coumadin dose 6/19. Received 5mg  6/20 & 10/14/14.   No bleeding issues noted.   Hgb low stable at 8.4 and Plt 118< 125 K. No overt s/sx bleeding reported. On amio 400 mg BID (likely to increase coumadin effect; will monitor). Hemodialysis done yesterday morning,  didn't receive additional heparin during session.     Goal of Therapy:  Heparin level 0.3-0.7 units/ml Monitor platelets by anticoagulation protocol: Yes   Plan:  Continue IV heparin 1800 units/hr Coumadin 4 mg po today (monitoring for drug interaction with amiodarone). Will likely need to lower coumadin dose tomorrow.  Daily INR, HL, CBC Monitor for signs and symptoms of bleeding Coumadin education completed 10/12/14  Noah Delaine,  RPh Clinical Pharmacist Pager: [phone]  10/15/2014 2:18 PM

## 2014-10-16 DIAGNOSIS — I10 Essential (primary) hypertension: Secondary | ICD-10-CM

## 2014-10-16 DIAGNOSIS — E119 Type 2 diabetes mellitus without complications: Secondary | ICD-10-CM

## 2014-10-16 LAB — GLUCOSE, CAPILLARY
Glucose-Capillary: 121 mg/dL — ABNORMAL HIGH (ref 65–99)
Glucose-Capillary: 192 mg/dL — ABNORMAL HIGH (ref 65–99)
Glucose-Capillary: 105 mg/dL — ABNORMAL HIGH (ref 65–99)
Glucose-Capillary: 174 mg/dL — ABNORMAL HIGH (ref 65–99)

## 2014-10-16 LAB — HEPARIN LEVEL (UNFRACTIONATED): Heparin Unfractionated: 0.48 IU/mL (ref 0.30–0.70)

## 2014-10-16 LAB — PROTIME-INR
INR: 3.06 — ABNORMAL HIGH (ref 0.00–1.49)
PROTHROMBIN TIME: 31.1 s — AB (ref 11.6–15.2)

## 2014-10-16 LAB — HEPATIC FUNCTION PANEL
ALK PHOS: 104 U/L (ref 38–126)
ALT: 16 U/L — ABNORMAL LOW (ref 17–63)
AST: 22 U/L (ref 15–41)
Albumin: 2.2 g/dL — ABNORMAL LOW (ref 3.5–5.0)
BILIRUBIN INDIRECT: 0.9 mg/dL (ref 0.3–0.9)
Bilirubin, Direct: 0.7 mg/dL — ABNORMAL HIGH (ref 0.1–0.5)
TOTAL PROTEIN: 5.8 g/dL — AB (ref 6.5–8.1)
Total Bilirubin: 1.6 mg/dL — ABNORMAL HIGH (ref 0.3–1.2)

## 2014-10-16 LAB — OCCULT BLOOD X 1 CARD TO LAB, STOOL: FECAL OCCULT BLD: NEGATIVE

## 2014-10-16 LAB — RENAL FUNCTION PANEL
ALBUMIN: 2.2 g/dL — AB (ref 3.5–5.0)
Anion gap: 13 (ref 5–15)
BUN: 36 mg/dL — ABNORMAL HIGH (ref 6–20)
CO2: 27 mmol/L (ref 22–32)
Calcium: 9.1 mg/dL (ref 8.9–10.3)
Chloride: 94 mmol/L — ABNORMAL LOW (ref 101–111)
Creatinine, Ser: 4.65 mg/dL — ABNORMAL HIGH (ref 0.61–1.24)
GFR calc Af Amer: 16 mL/min — ABNORMAL LOW (ref >60)
GFR calc non Af Amer: 13 mL/min — ABNORMAL LOW (ref >60)
Glucose, Bld: 124 mg/dL — ABNORMAL HIGH (ref 65–99)
PHOSPHORUS: 4.1 mg/dL (ref 2.5–4.6)
Potassium: 3.9 mmol/L (ref 3.5–5.1)
SODIUM: 134 mmol/L — AB (ref 135–145)

## 2014-10-16 LAB — PREPARE RBC (CROSSMATCH)

## 2014-10-16 LAB — CBC
HEMATOCRIT: 23.5 % — AB (ref 39.0–52.0)
Hemoglobin: 7.5 g/dL — ABNORMAL LOW (ref 13.0–17.0)
MCH: 28.6 pg (ref 26.0–34.0)
MCHC: 31.9 g/dL (ref 30.0–36.0)
MCV: 89.7 fL (ref 78.0–100.0)
Platelets: 134 10*3/uL — ABNORMAL LOW (ref 150–400)
RBC: 2.62 MIL/uL — ABNORMAL LOW (ref 4.22–5.81)
RDW: 18.8 % — AB (ref 11.5–15.5)
WBC: 9.3 10*3/uL (ref 4.0–10.5)

## 2014-10-16 MED ORDER — HYDROMORPHONE HCL 1 MG/ML IJ SOLN
INTRAMUSCULAR | Status: AC
  Filled 2014-10-16: qty 1

## 2014-10-16 MED ORDER — OXYCODONE-ACETAMINOPHEN 5-325 MG PO TABS
1.0000 | ORAL_TABLET | Freq: Three times a day (TID) | ORAL | Status: DC
  Administered 2014-10-17: 1 via ORAL
  Filled 2014-10-16: qty 1

## 2014-10-16 MED ORDER — SODIUM CHLORIDE 0.9 % IV SOLN: Freq: Once | INTRAVENOUS | Status: DC

## 2014-10-16 MED ORDER — GABAPENTIN 300 MG PO CAPS
300.0000 mg | ORAL_CAPSULE | Freq: Every day | ORAL | Status: DC
  Administered 2014-10-16 – 2014-10-17 (×2): 300 mg via ORAL
  Filled 2014-10-16 (×3): qty 1

## 2014-10-16 MED ORDER — OXYCODONE-ACETAMINOPHEN 5-325 MG PO TABS
1.0000 | ORAL_TABLET | Freq: Once | ORAL | Status: AC
  Administered 2014-10-16: 1 via ORAL
  Filled 2014-10-16: qty 1

## 2014-10-16 NOTE — Progress Notes (Signed)
Arrangements have been made for patient to begin outpatient dialysis at [facility] on Tuesday, October 21, 2014.

## 2014-10-16 NOTE — Progress Notes (Signed)
[facility] PROGRESS NOTE  [name] HFW:[id number] DOB: 1965-05-22 DOA: 09/24/2014 PCP: [name], NP  Assessment/Plan:  Metabolic Encephalopathy resolved Patient presented with metabolic encephalopathy likely from uremia worsening cardiac function and sedating medications.  As this time it is resolved and patient is at baseline.  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 Currently patient is in sinus rhythm and  on oral amiodarone as recommended by the EP. We'll continue to monitor the patient on telemetry  On coumadin dose per pharmacy. Will need to set up f/u with cardiology to monitor INR  Acute onset Afib 6/6 Patient acute onset A. fib, chads 2vasc  score is 3, patient is currently on IV heparin. Patient started on coumadin per pharmacy.  PAD - R LE ischemia/pain  Now s/p R AKA - Vasc Surgery following -  Currently on oxycontin  20 mg po q12 hr, also on dilaudid 1 mg Iv q 2 hr prn, gabapentin 300mg  po qhs, Patient report pain not well controlled, still unbearable breakthrough pain, agreed with schedule percocet, will schedule percocet q8hr with additional one time dose now at 7 pm, likely will need up titrated oxycontin.  Bilateral lower extremity cellulitis w/ sepsis due to unspecified organism Patient presented with bilateral lower extremity cellulitis and was started on IV antibiotic switch were discontinued yesterday. Patient continues to be afebrile.  Jaundice  total bilirubin fluctuating - acute Hepatitis panel negative - RPR negative - anti-nuclear antibody negative - HIV negative -Hepatic ultrasound  revealed normal appearing parenchyma with ascites (likely due to ESRD)   CAD s/p CABG 2010 Chronic Systolic CHF / Cardiomyopathy  -TTE 02/21/14 showed EF 20-25% with grade 3 diastolic dysfunction.  -seen by Dr. Royann Shivers on 08/07/14 w/ decision regarding cardiac device therapy depending heavily on his decision regarding dialysis - ICD was to be considered if he started dialysis. -6/3 TTE shows cardiomyopathy with worsening EF now at 20%  -EP does not feel he is presently a candidate for aggressive procedures -Volume management per hemodialysis  HTN Blood pressures currently reasonably controlled   Hyperlipidemia  Lipid panel abnormal  DM type-II  -6/3 A1c 6.4 - CBG currently controlled   cdiff on oral vanc, reported diarrhea has stopped, last bm last night  Code Status: Full code Family Communication: patient and sister in room Disposition Plan: Skilled nursing facility   Consultants: Nephrology Vasc Surgery EP  Procedures:  Echocardiogram- EF 20%, mild LVH  Antibiotics: Vancomycin 6 /1 > 6/15 Zosyn 6/1 > 6/15 Oral vanc from 6/16.  HPI/Subjective: 49 y.o. M Hx hypertension, hyperlipidemia, diabetes mellitus, GERD, CAD S/P CABG 2010, combined systolic and diastolic congestive heart failure (EF 20-25% with grade 3 diastolic dysfunction), chronic kidney disease stage IV, severe PAD, and substance abuse who presented with right leg swelling and pain for several days. His legs became red from foot to upper thigh medially. He did not have fever or chills. Patient was unable to ambulate secondary to pain.   Patient says that the pain is better, diarrhea has stopped, able to sit through HD today. Does report breakthrough pain in right stump.  Objective: Filed Vitals:   10/16/14 1859  BP: 131/59  Pulse: 69  Temp: 99.1 F (37.3 C)  Resp: 24    Intake/Output Summary (Last 24 hours) at 10/16/14 1913 Last data filed at 10/16/14 1758  Gross per 24 hour  Intake   2077 ml   Output   1660 ml  Net    417 ml   Filed Weights   10/14/14 0803 10/14/14 1224 10/16/14 1358  Weight: 75.2 kg (165 lb 12.6 oz) 69.8 kg (153 lb 14.1 oz) 72 kg (158 lb 11.7 oz)    Exam:   General:  Appears in no acute distress, chronically ill  Cardiovascular: S1-S2 is regular, no murmurs rubs or gallops  Respiratory: Clear to auscultation bilaterally, no wheezing or crackles  Abdomen: Soft, nontender, no organomegaly  Musculoskeletal: Status post right AKA, right stump in dressing   Data Reviewed: Basic Metabolic Panel:  Recent Labs Lab 10/10/14 0507 10/11/14 1408 10/14/14 0828 10/15/14 0137 10/16/14 0548  NA 134* 132* 131* 134* 134*  K 4.0 4.2 4.3 3.6 3.9  CL 98* 97* 95* 96* 94*  CO2 GLUCOSE 157* 175* 110* 137* 124*  BUN 25* 36* 44* 23* 36*  CREATININE 3.61* 5.00* 5.54* 3.62* 4.65*  CALCIUM 8.5* 8.5* 9.0 8.7* 9.1  PHOS  --   --  4.9* 3.6 4.1   Liver Function Tests:  Recent Labs Lab 10/14/14 0828 10/15/14 0137 10/16/14 0548  AST  --   --  22  ALT  --   --  16*  ALKPHOS  --   --  104  BILITOT  --   --  1.6*  PROT  --   --  5.8*  ALBUMIN 2.2* 2.2* 2.2*  2.2*   No results for input(s): LIPASE, AMYLASE in the last 168 hours. No results for input(s): AMMONIA in the last 168 hours. CBC:  Recent Labs Lab 10/12/14 1108 10/13/14 0607 10/14/14 0328 10/15/14 0137 10/16/14 0548  WBC 7.3 8.1 9.4 9.7 9.3  HGB 8.3* 8.3* 8.3* 8.4* 7.5*  HCT 25.9* 26.1* 25.3* 26.5* 23.5*  MCV 87.8 88.5 88.5 89.8 89.7  PLT 121* 128* 125* 118* 134*   Cardiac Enzymes: No results for input(s): CKTOTAL, CKMB, CKMBINDEX, TROPONINI in the last 168 hours. BNP (last 3 results)  Recent Labs  09/25/14 0410  BNP 2227.0*    ProBNP (last 3 results) No results for input(s): PROBNP in the last 8760 hours.  CBG:  Recent Labs Lab 10/15/14 1153 10/15/14 1736 10/15/14 2031 10/16/14 0803 10/16/14 1205  GLUCAP 153* 89 133* 121* 192*    Recent Results (from  the past 240 hour(s))  Clostridium Difficile by PCR (not at [facility])     Status: Abnormal   Collection Time: 10/09/14  4:33 AM  Result Value Ref Range Status   C difficile by pcr POSITIVE (A) NEGATIVE Final    Comment: CRITICAL RESULT CALLED TO, READ BACK BY AND VERIFIED WITH: RN HAYWOOD,K AT 1610 [id number] [name]      Studies: No results found.  Scheduled Meds: . sodium chloride   Intravenous Once  . amiodarone  400 mg Oral BID  . calcium acetate  1,334 mg Oral TID WC  . collagenase   Topical Daily  . darbepoetin (ARANESP) injection - DIALYSIS  100 mcg Intravenous Q Tue-HD  . feeding supplement (NEPRO CARB STEADY)  237 mL Oral BID BM  . feeding supplement (PRO-STAT SUGAR FREE 64)  30 mL Oral BID  . folic acid  1 mg Oral Daily  . gabapentin  300  mg Oral QHS  . insulin aspart  0-9 Units Subcutaneous TID WC  . metoprolol tartrate  25 mg Oral BID  . multivitamin  1 tablet Oral QHS  . OxyCODONE  20 mg Oral Q12H  . pantoprazole  40 mg Oral Q1200  . polyethylene glycol  17 g Oral BID  . senna-docusate  1 tablet Oral BID  . sodium chloride  3 mL Intravenous Q12H  . thiamine  100 mg Oral Daily  . vancomycin  125 mg Oral QID  . Warfarin - Pharmacist Dosing Inpatient   Does not apply q1800   Continuous Infusions:    Principal Problem:   Bilateral lower leg cellulitis Active Problems:   S/P CABG x 3:  March 2010: left internal mammary artery to distal left anterior descending, SVG to Circ marginal vessel, SVG to PDA   HTN (hypertension)   Hyperlipidemia   DM2 (diabetes mellitus, type 2)   Acute renal failure superimposed on stage 4 chronic kidney disease   CAD (coronary artery disease)   NSTEMI (non-ST elevated myocardial infarction)   PAD (peripheral artery disease)   GERD (gastroesophageal reflux disease)   Combined systolic and diastolic congestive heart failure   Cellulitis of right leg   Sepsis   Cellulitis   Constipation   Blood poisoning   Jaundice   Peripheral  artery disease   CAD in native artery   Essential hypertension   HLD (hyperlipidemia)   Diabetes type 2, uncontrolled   Acute on chronic renal failure   Chronic combined systolic and diastolic CHF (congestive heart failure)   Metabolic encephalopathy   Cellulitis of leg, right   Cellulitis of leg, left   Chronic systolic heart failure   Cardiomyopathy   Hypokalemia   Systolic CHF, chronic   Chronic systolic CHF (congestive heart failure)   Diabetes type 2, controlled   Protein-calorie malnutrition, severe   Sepsis affecting skin   Gangrene of foot   Pressure ulcer   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 If 7PM-7AM, please contact night-coverage at [email or web address], password [facility] 10/16/2014, 7:13 PM  LOS: 22 days

## 2014-10-16 NOTE — Progress Notes (Signed)
Nutrition Follow-up  DOCUMENTATION CODES:  Severe malnutrition in context of chronic illness  INTERVENTION:  Nepro shake, Prostat BID.  Encourage adequate PO intake.   NUTRITION DIAGNOSIS:  Malnutrition related to chronic illness as evidenced by severe depletion of body fat, severe depletion of muscle mass; ongoing  GOAL:  Patient will meet greater than or equal to 90% of their needs; met  MONITOR:  PO intake, Supplement acceptance, Skin, I & O's, Weight trends, Labs  REASON FOR ASSESSMENT:  Consult Diet education  ASSESSMENT: Hx of uncontrolled DM, CHF, severe PAD, CAD s/p CABG, CKD IV, and ETOH abuse admitted with right leg pain/sweeling. Patient started HD 6/5. S/P right AKA on 6/13. Renal diet education given 6/17.   Pt reports having a good appetite. Meal completion has been 25-100%, however most meals have been 100%. Pt has been consuming his supplements. RD to continue with current orders. Pt was encouraged to eat his food at meals and to drink his supplements.   Labs: Low sodium, chloride, ALT and GFR. High indirect and direct bilirubin, BUN and creatinine.  Height:  Ht Readings from Last 1 Encounters:  10/06/14 _0  (1.803 m)    Weight:  Wt Readings from Last 1 Encounters:  10/14/14 153 lb 14.1 oz (69.8 kg)    Ideal Body Weight:  78 kg  Wt Readings from Last 10 Encounters:  10/14/14 153 lb 14.1 oz (69.8 kg)  08/07/14 194 lb 11.2 oz (88.315 kg)  02/10/14 200 lb 9.6 oz (90.992 kg)  11/07/13 185 lb 9.6 oz (84.188 kg)  10/01/13 189 lb (85.73 kg)  05/09/13 196 lb 3.2 oz (88.996 kg)  11/19/12 201 lb 9.6 oz (91.445 kg)  03/23/11 202 lb 2.6 oz (91.7 kg)    BMI:  Body mass index is 21.47 kg/(m^2).  Estimated Nutritional Needs:  Kcal:  2100 - 2300  Protein:  105-115 grams  Fluid:  1.2 L/day  Skin:  Wound (see comment) (Unstageable pressure ulcer on sacrum, incision on R thigh and L arm, +1 generalized edema)  Diet Order:  Diet renal with  fluid restriction Fluid restriction:: 1200 mL Fluid; Room service appropriate?: Yes; Fluid consistency:: Thin  EDUCATION NEEDS:  Education needs no appropriate at this time   Intake/Output Summary (Last 24 hours) at 10/16/14 1215 Last data filed at 10/16/14 0829  Gross per 24 hour  Intake   1296 ml  Output      0 ml  Net   1296 ml    Last BM:  6/22  Corrin Parker, MS, RD, LDN Pager # [phone] After hours/ weekend pager # [phone]

## 2014-10-16 NOTE — Progress Notes (Signed)
[facility] Rounding Note  Subjective:  For dialysis today Dialyze in a chair to make sure can tolerate Accepted at [facility] but no chair time yet  06/22 0701 - 06/23 0700 In: 1316 [P.O.:1100; I.V.:216] Out: 0   Filed Weights   10/13/14 0500 10/14/14 0803 10/14/14 1224  Weight: 73.21 kg (161 lb 6.4 oz) 75.2 kg (165 lb 12.6 oz) 69.8 kg (153 lb 14.1 oz)   Physical Exam:  Blood pressure 130/60, pulse 66, temperature 98.1 F (36.7 C), temperature source Oral, resp. rate 17, height 5\' 11"  (1.803 m), weight 73.21 kg (161 lb 6.4 oz), SpO2 99 %. NAD Multiple tattoos S1S2 No S3 Lungs clear Left upper arm AVF + bruit and thrill. Incisions healing Right IJ TDC  Abd non tender. + abdominal wall edema but less Right AKA stump wrapped + edema of stump above the wrap with edema to the hip Now just 1+ edema LLE.   Scheduled Meds: . sodium chloride   Intravenous Once  . amiodarone  400 mg Oral BID  . calcium acetate  1,334 mg Oral TID WC  . collagenase   Topical Daily  . darbepoetin (ARANESP) injection - DIALYSIS  100 mcg Intravenous Q Tue-HD  . feeding supplement (NEPRO CARB STEADY)  237 mL Oral BID BM  . feeding supplement (PRO-STAT SUGAR FREE 64)  30 mL Oral BID  . folic acid  1 mg Oral Daily  . gabapentin  300 mg Oral TID  . insulin aspart  0-9 Units Subcutaneous TID WC  . metoprolol tartrate  25 mg Oral BID  . multivitamin  1 tablet Oral QHS  . OxyCODONE  20 mg Oral Q12H  . pantoprazole  40 mg Oral Q1200  . polyethylene glycol  17 g Oral BID  . senna-docusate  1 tablet Oral BID  . sodium chloride  3 mL Intravenous Q12H  . thiamine  100 mg Oral Daily  . vancomycin  125 mg Oral QID  . Warfarin - Pharmacist Dosing Inpatient   Does not apply q1800   Continuous Infusions: . sodium chloride 25 mL/hr (10/10/14 0851)  . sodium chloride    . heparin 1,800 Units/hr (10/16/14 0951)   PRN Meds:.acetaminophen, HYDROmorphone (DILAUDID) injection, nitroGLYCERIN, ondansetron  (ZOFRAN) IV   Recent Labs Lab 10/14/14 0828 10/15/14 0137 10/16/14 0548  NA 131* 134* 134*  K 4.3 3.6 3.9  CL 95* 96* 94*  CO2 24 23 27   GLUCOSE 110* 137* 124*  BUN 44* 23* 36*  CREATININE 5.54* 3.62* 4.65*  CALCIUM 9.0 8.7* 9.1  PHOS 4.9* 3.6 4.1    Recent Labs Lab 10/14/14 0328 10/15/14 0137 10/16/14 0548  WBC 9.4 9.7 9.3  HGB 8.3* 8.4* 7.5*  HCT 25.3* 26.5* 23.5*  MCV 88.5 89.8 89.7  PLT 125* 118* 134*      Background: 49 yo prev followed by [facility] for baseline CKD4/5  with background of DM, HTN, GERD, CAD with prior CABG 2010, combined systolic and diastolic CHF (EF 85-88% with grade 3 diastolic dysfunction) severe PAD, substance use. Admitted with leg pain and swelling, s/p  AKA RLE 10/06/14. Creatinine on admission 7's, BUN 140's. Deemed new ESRD and HD initiated 09/28/14.  A/P 1. New ESRD 1. Started 6/5 via left fem cath. R IJ TDC placed 6/8. 2. Cont on THS Schedule 3. S/p L BC AVF 6/17 VVS Early - nice bruit and thrill 4. CLIPPED  to [facility] - waiting on chair time 5. 4h  6. Will  minimally need sliding board and wheelchair for transfers - appears per PT Michiel Sites not needed at this time 7. To sit in chair for HD today to make sure can sit that long 2. HTN/Vol 1. Post weight goal 70kg last tmt - got to 69.8 kg.  Will try for 69 kg with HD today. 2. Final EDW to be established 3. BP stable only on MTP 3. Anemia 1. Aranesp 100 qTues 2. Rec'd Feraheme as well 4. PAD and ischemic RLE, 1. 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 CKD-BMD 1. PTH = 97 on 09/29/14 No VDRA needed 2. PhosLo 2ac has normalized phosphorus 6. AMS, improved 7. C Diff colitis: PO Vanc. End date 10/23/14 8. Episode of VT req cardioversion 6/6, on amio 9. AFib on anticoagulation (heparin->warfarin per pharmacy) He will need to have INR managed by Cardiology at discharge - just thinking ahead so these arrangements can be made. INR therapeutic. Heparin to be stopped today. 10. Systolic  (EF 20-25) and diastolic (Gr 3)  HF: cont to lower EDW as tolerated 11. CAD s/p CABG (2010) 12. DM2 - SSI  [name], MD [facility] [phone] Pager 10/16/2014, 11:04 AM

## 2014-10-16 NOTE — Progress Notes (Signed)
Occupational Therapy Treatment Patient Details Name: [name] MRN: [id number] DOB: 03-Jan-1966 Today's Date: 10/16/2014    History of present illness Pt admitted with RLE cellulitis. PMHx of HTN, HLD, DM, GERD, CHF, CKD, polysubstance abuse. 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   OT comments  Good participation with session with focus on functional transfer using sliding board and educating staff on mobility with use of sliding board. Pt was visited this am by "peer support" from "[employer]" and was very appreciative of visit. Will continue to follow.   Follow Up Recommendations  SNF;Supervision/Assistance - 24 hour    Equipment Recommendations  3 in 1 bedside comode;Wheelchair (measurements OT);Wheelchair cushion (measurements OT)    Recommendations for Other Services      Precautions / Restrictions Precautions Precautions: Fall       Mobility Bed Mobility Overal bed mobility: Needs Assistance Bed Mobility: Supine to Sit Rolling: Supervision            Transfers Overall transfer level: Needs assistance   Transfers: Lateral/Scoot Transfers Sit to Stand: Min assist         General transfer comment: physical set up required for board    Balance     Sitting balance-Leahy Scale: Fair                             ADL                                       Functional mobility during ADLs: Minimal assistance;Cueing for safety;Cueing for sequencing (using sliding board) General ADL Comments: Nsg asked therapist to assist with transfer. Trained nsg on use of sliding board to help with pt trasnfer. discussed need for pt to take "control" and guide nsg in use of sliding board. Educated nsg/pt on proper set up of board, bed and chair. Pt also stated that a peer support person from "[employer]" visited this am. Pt was very appreciative of visit.       Vision                     Perception     Praxis      Cognition   Behavior During  Therapy: WFL for tasks assessed/performed Overall Cognitive Status: Within Functional Limits for tasks assessed                       Extremity/Trunk Assessment               Exercises     Shoulder Instructions       General Comments      Pertinent Vitals/ Pain       Pain Assessment: Faces Faces Pain Scale: Hurts little more Pain Location: RLE Pain Descriptors / Indicators: Grimacing;Discomfort Pain Intervention(s): Limited activity within patient's tolerance;Monitored during session  Home Living                                          Prior Functioning/Environment              Frequency Min 2X/week     Progress Toward Goals  OT Goals(current goals can now be found in the care plan section)  Progress towards OT goals: Progressing toward goals  Acute Rehab OT Goals Patient Stated Goal: get stronger OT Goal Formulation: With patient Time For Goal Achievement: 10/23/14 Potential to Achieve Goals: Good ADL Goals Pt Will Perform Grooming: with set-up;with supervision;sitting Pt Will Perform Upper Body Bathing: with set-up;with supervision;sitting Pt Will Perform Lower Body Bathing: with min assist;sit to/from stand Pt Will Perform Upper Body Dressing: with set-up;with supervision;sitting Pt Will Perform Lower Body Dressing: with min assist;sit to/from stand Pt Will Transfer to Toilet: with mod assist;squat pivot transfer;stand pivot transfer;bedside commode Pt Will Perform Toileting - Clothing Manipulation and hygiene: with min guard assist;sit to/from stand Additional ADL Goal #1: Pt will be independent in UB exercise program with level 2 theraband.  Plan Discharge plan remains appropriate    Co-evaluation                 End of Session Equipment Utilized During Treatment: Other (comment) (slide board)   Activity Tolerance Patient tolerated treatment well   Patient Left in chair;with call bell/phone within reach;with  nursing/sitter in room   Nurse Communication Mobility status        Time: 1205-1220 OT Time Calculation (min): 15 min  Charges: OT General Charges $OT Visit: 1 Procedure OT Treatments $Self Care/Home Management : 8-22 mins  Analysse Quinonez,HILLARY 10/16/2014, 12:38 PM   [facility], OTR/L  [phone] 10/16/2014

## 2014-10-16 NOTE — Progress Notes (Signed)
ANTICOAGULATION CONSULT NOTE - Follow up  Pharmacy Consult for heparin and coumadin Indication: atrial fibrillation  No Known Allergies  Patient Measurements: Height: 5\' 11"  (180.3 cm) Weight: 153 lb 14.1 oz (69.8 kg) IBW/kg (Calculated) : 75.3  Heparin Wt 72.2 kg   Vital Signs: Temp: 98.7 F (37.1 C) (06/23 0830) Temp Source: Oral (06/23 0830) BP: 126/69 mmHg (06/23 0830) Pulse Rate: 61 (06/23 0830)  Labs:  Recent Labs  10/14/14 0328 10/14/14 0828  10/15/14 0137 10/15/14 1033 10/16/14 0548  HGB 8.3*  --   --  8.4*  --  7.5*  HCT 25.3*  --   --  26.5*  --  23.5*  PLT 125*  --   --  118*  --  134*  LABPROT 19.6*  --   --  22.4*  --  31.1*  INR 1.66*  --   --  1.98*  --  3.06*  HEPARINUNFRC 0.84*  --   < > 0.46 0.52 0.48  CREATININE  --  5.54*  --  3.62*  --  4.65*  < > = values in this interval not displayed.  Estimated Creatinine Clearance: 18.8 mL/min (by C-G formula based on Cr of 4.65).  Assessment: 49 YOM with new onset afib, on heparin bridge to coumadin. Heparin level this AM is 0.48 on IV heparin drip 1800 units/hr. INR = 3.06 today which is therpeutic but INR increased significantly from 1.98 yesterday, most likely due to drug-drug interaction with oral amiodarone 400mg  po BID. I decreased coumadin dose yesterday.   No bleeding issues noted.  Hgb has been  low stable but has decreased to 7.5 today < 8.4 and Pltc stable 134K<118<125 K. No overt s/sx of bleeding reported.  I will hold coumadin today as I expect INR will increase secondary to DDI with amiodarone.  Dr. Roda Shutters gave verbal order to discontinue IV heparin today.    Goal of Therapy:  Heparin level 0.3-0.7 units/ml Monitor platelets by anticoagulation protocol: Yes   Plan:  Plan:  - DC heparin drip per Dr. Roda Shutters - Hold Coumadin today d/t DDI with amio - Daily INR, CBC - Monitor for signs and symptoms of bleeding TRH notes need to set up outpt f/u with cardiology to monitor INR - Dr. Roda Shutters agreed to  decrease gabapentin to 300mg  qHS (Dr. Eliott Nine agrees w/this dose)  -Coumadin education completed 10/12/14   Noah Delaine, RPh Clinical Pharmacist Pager: [phone] 10/16/2014 11:22 AM

## 2014-10-17 ENCOUNTER — Inpatient Hospital Stay (HOSPITAL_COMMUNITY): Payer: Medicare Other

## 2014-10-17 LAB — CBC
HCT: 27.2 % — ABNORMAL LOW (ref 39.0–52.0)
HEMOGLOBIN: 8.7 g/dL — AB (ref 13.0–17.0)
MCH: 28.7 pg (ref 26.0–34.0)
MCHC: 32 g/dL (ref 30.0–36.0)
MCV: 89.8 fL (ref 78.0–100.0)
Platelets: 122 10*3/uL — ABNORMAL LOW (ref 150–400)
RBC: 3.03 MIL/uL — ABNORMAL LOW (ref 4.22–5.81)
RDW: 20.3 % — AB (ref 11.5–15.5)
WBC: 10.7 10*3/uL — ABNORMAL HIGH (ref 4.0–10.5)

## 2014-10-17 LAB — TYPE AND SCREEN
ABO/RH(D): O POS
ANTIBODY SCREEN: NEGATIVE
Unit division: 0
Unit division: 0

## 2014-10-17 LAB — GLUCOSE, CAPILLARY
Glucose-Capillary: 184 mg/dL — ABNORMAL HIGH (ref 65–99)
Glucose-Capillary: 175 mg/dL — ABNORMAL HIGH (ref 65–99)
Glucose-Capillary: 121 mg/dL — ABNORMAL HIGH (ref 65–99)

## 2014-10-17 LAB — PROTIME-INR
INR: 3.1 — ABNORMAL HIGH (ref 0.00–1.49)
Prothrombin Time: 31.4 seconds — ABNORMAL HIGH (ref 11.6–15.2)

## 2014-10-17 MED ORDER — PRO-STAT SUGAR FREE PO LIQD: 30.0000 mL | Freq: Two times a day (BID) | ORAL | Status: DC

## 2014-10-17 MED ORDER — POLYETHYLENE GLYCOL 3350 17 G PO PACK: 17.0000 g | PACK | Freq: Two times a day (BID) | ORAL | Status: DC

## 2014-10-17 MED ORDER — COLLAGENASE 250 UNIT/GM EX OINT: TOPICAL_OINTMENT | Freq: Every day | CUTANEOUS | Status: DC

## 2014-10-17 MED ORDER — NEPRO/CARBSTEADY PO LIQD: 237.0000 mL | Freq: Two times a day (BID) | ORAL | Status: DC

## 2014-10-17 MED ORDER — OXYCODONE HCL 10 MG PO TABS: 10.0000 mg | ORAL_TABLET | Freq: Four times a day (QID) | ORAL | Status: DC | PRN

## 2014-10-17 MED ORDER — MIDODRINE HCL 5 MG PO TABS
10.0000 mg | ORAL_TABLET | ORAL | Status: DC
  Administered 2014-10-18: 10 mg via ORAL
  Filled 2014-10-17 (×2): qty 2

## 2014-10-17 MED ORDER — DARBEPOETIN ALFA 150 MCG/0.3ML IJ SOSY: 150.0000 ug | PREFILLED_SYRINGE | INTRAMUSCULAR | Status: DC

## 2014-10-17 MED ORDER — DARBEPOETIN ALFA 100 MCG/0.5ML IJ SOSY: 100.0000 ug | PREFILLED_SYRINGE | INTRAMUSCULAR | Status: DC

## 2014-10-17 MED ORDER — SENNOSIDES-DOCUSATE SODIUM 8.6-50 MG PO TABS: 1.0000 | ORAL_TABLET | Freq: Two times a day (BID) | ORAL | Status: DC

## 2014-10-17 MED ORDER — GABAPENTIN 300 MG PO CAPS: 300.0000 mg | ORAL_CAPSULE | Freq: Every day | ORAL | Status: DC

## 2014-10-17 MED ORDER — FOLIC ACID 1 MG PO TABS: 1.0000 mg | ORAL_TABLET | Freq: Every day | ORAL | Status: DC

## 2014-10-17 MED ORDER — CALCIUM ACETATE (PHOS BINDER) 667 MG PO CAPS: 1334.0000 mg | ORAL_CAPSULE | Freq: Three times a day (TID) | ORAL | Status: DC

## 2014-10-17 MED ORDER — WARFARIN SODIUM 2.5 MG PO TABS: 2.5000 mg | ORAL_TABLET | Freq: Every day | ORAL | Status: DC

## 2014-10-17 MED ORDER — INSULIN ASPART 100 UNIT/ML ~~LOC~~ SOLN: 0.0000 [IU] | Freq: Three times a day (TID) | SUBCUTANEOUS | Status: DC

## 2014-10-17 MED ORDER — VANCOMYCIN HCL 125 MG PO CAPS: 125.0000 mg | ORAL_CAPSULE | Freq: Four times a day (QID) | ORAL | Status: DC

## 2014-10-17 MED ORDER — WARFARIN SODIUM 2.5 MG PO TABS
2.5000 mg | ORAL_TABLET | Freq: Once | ORAL | Status: AC
  Administered 2014-10-17: 2.5 mg via ORAL
  Filled 2014-10-17: qty 1

## 2014-10-17 MED ORDER — AMIODARONE HCL 400 MG PO TABS: 400.0000 mg | ORAL_TABLET | Freq: Two times a day (BID) | ORAL | Status: DC

## 2014-10-17 MED ORDER — POLYETHYLENE GLYCOL 3350 17 G PO PACK: 17.0000 g | PACK | Freq: Every day | ORAL | Status: AC | PRN

## 2014-10-17 MED ORDER — RENA-VITE PO TABS
1.0000 | ORAL_TABLET | Freq: Every day | ORAL | Status: DC
Start: 2014-10-17 — End: 2014-12-02

## 2014-10-17 MED ORDER — OXYCODONE HCL 5 MG PO TABS
10.0000 mg | ORAL_TABLET | Freq: Four times a day (QID) | ORAL | Status: DC | PRN
  Administered 2014-10-17 – 2014-10-18 (×3): 10 mg via ORAL
  Filled 2014-10-17 (×3): qty 2

## 2014-10-17 MED ORDER — CALCIUM CARBONATE ANTACID 500 MG PO CHEW
1.0000 | CHEWABLE_TABLET | Freq: Once | ORAL | Status: AC
  Administered 2014-10-17: 200 mg via ORAL
  Filled 2014-10-17: qty 1

## 2014-10-17 NOTE — Progress Notes (Signed)
Patient asleep,awakens easily, states "breathing okay". Incoming RN made aware. Federica Allport, Drinda Butts, Charity fundraiser

## 2014-10-17 NOTE — Progress Notes (Signed)
ANTICOAGULATION CONSULT NOTE - Follow up  Pharmacy Consult for coumadin Indication: atrial fibrillation  No Known Allergies  Patient Measurements: Height: 5\' 11"  (180.3 cm) Weight: 160 lb 6.4 oz (72.757 kg) IBW/kg (Calculated) : 75.3  Heparin Wt 72.2 kg   Vital Signs: Temp: 98 F (36.7 C) (06/24 0948) Temp Source: Oral (06/24 0948) BP: 123/67 mmHg (06/24 0948) Pulse Rate: 72 (06/24 0948)  Labs:  Recent Labs  10/15/14 0137 10/15/14 1033 10/16/14 0548 10/17/14 1000  HGB 8.4*  --  7.5* 8.7*  HCT 26.5*  --  23.5* 27.2*  PLT 118*  --  134* 122*  LABPROT 22.4*  --  31.1* 31.4*  INR 1.98*  --  3.06* 3.10*  HEPARINUNFRC 0.46 0.52 0.48  --   CREATININE 3.62*  --  4.65*  --     Estimated Creatinine Clearance: 19.6 mL/min (by C-G formula based on Cr of 4.65).  Assessment: 49 YOM with new onset afib, bridged from IV heparin bridge to therapeutic coumadin. Heparin drip was DC'd yesterday 6/23 once INR therapeutic. INR is 3.1 today which is slightly above therapeutic goal 2-3. Coumadin dose was held yesterday because the INR increased significantly from 1.98 to 3.06 which is most likely due to coumadin drug-drug interaction with oral amiodarone 400mg  po BID.  Hgb has been  low stable, increased today to 8.7 from 7.5,  Pltc stable at 122< 134K<118<125 K. No overt s/sx of bleeding reported.  I anticipate patient will need lower coumadin dosage due to drug interaction with amiodarone.   Coumadin doses given from 6/17 - 6/23: 5mg , 5mg , none, 5mg , 5mg , 4mg  , and none on 6/23. INR trend 1.25>1.28>1.43>1.66>1.98>3.06>3.1.  I will give coumadin 2.5 mg today.   Goal of Therapy:  Heparin level 0.3-0.7 units/ml Monitor platelets by anticoagulation protocol: Yes    Plan:  Give coumadin 2.5 mg today x1  Daily INR, CBC Monitor for signs and symptoms of bleeding Coumadin education completed 10/12/14   Noah Delaine, RPh Clinical Pharmacist Pager: [phone] 10/17/2014 12:39 PM

## 2014-10-17 NOTE — Progress Notes (Signed)
[facility] PROGRESS NOTE  [name] RUE:[id number] DOB: [birth year] DOA: 09/24/2014 PCP: [name], NP  Assessment/Plan:  Metabolic Encephalopathy resolved Patient presented with metabolic encephalopathy likely from uremia worsening cardiac function and sedating medications.  As this time it is resolved and patient is at baseline.  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 s/p  Left AV fistula Brachial/cephalic (6/17) ready to use in 12 weeks  PAF/Episode of monomorphic VT / wide complex tachycardia  Patient had episode of wide complex tachycardia in the hospital which required cardioversion on 09/29/2014 ( code blue was called at the time, patient was transferred to ICU and stayed for a few days in the ICU).  Currently patient is in sinus rhythm and  on oral amiodarone as recommended by the EP.  On coumadin dose per pharmacy.  Close outpatient f/u with cardiology to monitor INR   Acute onset Afib 6/6 acute onset A. fib, chads 2vasc  score is 3, patient is currently on IV heparin. Patient started on coumadin per pharmacy.  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 -outpatient vascular surgery follow up in three weeks from discharge  Bilateral lower extremity cellulitis w/ sepsis due to unspecified organism Patient presented with bilateral lower extremity cellulitis and was started on IV antibiotic vanc/zosyn finished two weeks of treatment as of 6/15. Patient continues to be afebrile.  Jaundice  acute Hepatitis panel negative - RPR  negative - anti-nuclear antibody negative - HIV negative -Hepatic ultrasound revealed normal appearing parenchyma with ascites (likely due to ESRD)  -lft trending towards normal  CAD s/p CABG 2010 Chronic Systolic CHF / Cardiomyopathy  -TTE 02/21/14 showed EF 20-25% with grade 3 diastolic dysfunction.  -seen by Dr. Royann Shivers on 08/07/14 w/ decision regarding cardiac device therapy depending heavily on his decision regarding dialysis - ICD was to be considered if he started dialysis. -6/3 TTE shows cardiomyopathy with worsening EF now at 20%  -EP does not feel he is presently a candidate for aggressive procedures -Volume management per hemodialysis -close outpatient follow up with cardiology and coumadin clinic  HTN Blood pressures currently reasonably controlled   Hyperlipidemia  Lipid panel abnormal  DM type-II  -6/3 A1c 6.4 - CBG currently controlled on ssi  cdiff on oral vanc, reported diarrhea has stopped, last bm last night. Po vanc total of 14days from 6/16  Code Status: Full code Family Communication: patient  Disposition Plan: Skilled nursing facility on Saturday after HD   Consultants: Nephrology Vasc Surgery EP PCCM  Procedures:  Echocardiogram- EF 20%, mild LVH 120J synchronized defibrillation 6/6 Left AV fistula Brachial/cephalic 6/17 Dr. Arbie Cookey  Right AKA  6/13 Dr. Darrick [facility] TTS   Antibiotics: Vancomycin 6 /1 > 6/15 Zosyn 6/1 > 6/15 Oral vanc from 6/16 for total of two weeks  HPI/Subjective: 49 y.o. M Hx hypertension, hyperlipidemia, diabetes mellitus, GERD, CAD S/P CABG 2010, combined systolic and diastolic congestive heart failure (EF 20-25% with grade 3 diastolic dysfunction), chronic kidney disease stage IV, severe PAD, and substance abuse who presented with right leg  swelling and pain for several days. His legs became red from foot to upper thigh medially. He did not have fever or chills. Patient was unable to ambulate secondary to pain.    Patient says feeling better,  Does report breakthrough pain in right stump.  Objective: Filed Vitals:   10/17/14 0948  BP: 123/67  Pulse: 72  Temp: 98 F (36.7 C)  Resp: 20    Intake/Output Summary (Last 24 hours) at 10/17/14 1101 Last data filed at 10/17/14 0950  Gross per 24 hour  Intake   1991 ml  Output   1660 ml  Net    331 ml   Filed Weights   10/14/14 1224 10/16/14 1358 10/16/14 2054  Weight: 69.8 kg (153 lb 14.1 oz) 72 kg (158 lb 11.7 oz) 72.757 kg (160 lb 6.4 oz)    Exam:   General:  Appears in no acute distress, chronically ill  Cardiovascular: S1-S2 is regular, no murmurs rubs or gallops  Respiratory: Clear to auscultation bilaterally, no wheezing or crackles  Abdomen: Soft, nontender, no organomegaly  Musculoskeletal: Status post right AKA, right stump in dressing   Data Reviewed: Basic Metabolic Panel:  Recent Labs Lab 10/11/14 1408 10/14/14 0828 10/15/14 0137 10/16/14 0548  NA 132* 131* 134* 134*  K 4.2 4.3 3.6 3.9  CL 97* 95* 96* 94*  CO2 GLUCOSE 175* 110* 137* 124*  BUN 36* 44* 23* 36*  CREATININE 5.00* 5.54* 3.62* 4.65*  CALCIUM 8.5* 9.0 8.7* 9.1  PHOS  --  4.9* 3.6 4.1   Liver Function Tests:  Recent Labs Lab 10/14/14 0828 10/15/14 0137 10/16/14 0548  AST  --   --  22  ALT  --   --  16*  ALKPHOS  --   --  104  BILITOT  --   --  1.6*  PROT  --   --  5.8*  ALBUMIN 2.2* 2.2* 2.2*  2.2*   No results for input(s): LIPASE, AMYLASE in the last 168 hours. No results for input(s): AMMONIA in the last 168 hours. CBC:  Recent Labs Lab 10/13/14 0607 10/14/14 0328 10/15/14 0137 10/16/14 0548 10/17/14 1000  WBC 8.1 9.4 9.7 9.3 10.7*  HGB 8.3* 8.3* 8.4* 7.5* 8.7*  HCT 26.1* 25.3* 26.5* 23.5* 27.2*  MCV 88.5 88.5 89.8 89.7 89.8  PLT 128* 125* 118* 134* 122*   Cardiac Enzymes: No results for input(s): CKTOTAL, CKMB, CKMBINDEX, TROPONINI in the last 168 hours. BNP (last 3 results)  Recent Labs   09/25/14 0410  BNP 2227.0*    ProBNP (last 3 results) No results for input(s): PROBNP in the last 8760 hours.  CBG:  Recent Labs Lab 10/16/14 0803 10/16/14 1205 10/16/14 1900 10/16/14 2054 10/17/14 0826  GLUCAP 121* 192* 105* 174* 184*    Recent Results (from the past 240 hour(s))  Clostridium Difficile by PCR (not at [facility])     Status: Abnormal   Collection Time: 10/09/14  4:33 AM  Result Value Ref Range Status   C difficile by pcr POSITIVE (A) NEGATIVE Final    Comment: CRITICAL RESULT CALLED TO, READ BACK BY AND VERIFIED WITH: RN HAYWOOD,K AT [phone] [id number] [name]      Studies: Dg Chest Port 1 View  10/17/2014   CLINICAL DATA:  Shortness of breath, chest pressure last night  EXAM: PORTABLE CHEST - 1 VIEW  COMPARISON:  10/01/2014  FINDINGS: Cardiomegaly again noted. Status post CABG. Dual lumen right IJ catheter is unchanged in position.  No acute infiltrate or pulmonary edema. There is no pneumothorax.  IMPRESSION: No active disease. Cardiomegaly again noted. Status post CABG. Stable dual lumen right IJ catheter position. No pneumothorax.   Electronically Signed   By: Natasha Mead M.D.   On: 10/17/2014 09:09    Scheduled Meds: . sodium chloride   Intravenous Once  . amiodarone  400 mg Oral BID  . calcium acetate  1,334 mg Oral TID WC  . collagenase   Topical Daily  . [START ON 10/21/2014] darbepoetin (ARANESP) injection - DIALYSIS  150 mcg Intravenous Q Tue-HD  . feeding supplement (NEPRO CARB STEADY)  237 mL Oral BID BM  . feeding supplement (PRO-STAT SUGAR FREE 64)  30 mL Oral BID  . folic acid  1 mg Oral Daily  . gabapentin  300 mg Oral QHS  . insulin aspart  0-9 Units Subcutaneous TID WC  . metoprolol tartrate  25 mg Oral BID  . [START ON 10/18/2014] midodrine  10 mg Oral Q T,Th,Sa-HD  . multivitamin  1 tablet Oral QHS  . OxyCODONE  20 mg Oral Q12H  . oxyCODONE-acetaminophen  1 tablet Oral 3 times per day  . pantoprazole  40 mg Oral Q1200  . polyethylene glycol   17 g Oral BID  . senna-docusate  1 tablet Oral BID  . sodium chloride  3 mL Intravenous Q12H  . thiamine  100 mg Oral Daily  . vancomycin  125 mg Oral QID  . Warfarin - Pharmacist Dosing Inpatient   Does not apply q1800   Continuous Infusions:    Principal Problem:   Bilateral lower leg cellulitis Active Problems:   S/P CABG x 3:  March 2010: left internal mammary artery to distal left anterior descending, SVG to Circ marginal vessel, SVG to PDA   HTN (hypertension)   Hyperlipidemia   DM2 (diabetes mellitus, type 2)   Acute renal failure superimposed on stage 4 chronic kidney disease   CAD (coronary artery disease)   NSTEMI (non-ST elevated myocardial infarction)   PAD (peripheral artery disease)   GERD (gastroesophageal reflux disease)   Combined systolic and diastolic congestive heart failure   Cellulitis of right leg   Sepsis   Cellulitis   Constipation   Blood poisoning   Jaundice   Peripheral artery disease   CAD in native artery   Essential hypertension   HLD (hyperlipidemia)   Diabetes type 2, uncontrolled   Acute on chronic renal failure   Chronic combined systolic and diastolic CHF (congestive heart failure)   Metabolic encephalopathy   Cellulitis of leg, right   Cellulitis of leg, left   Chronic systolic heart failure   Cardiomyopathy   Hypokalemia   Systolic CHF, chronic   Chronic systolic CHF (congestive heart failure)   Diabetes type 2, controlled   Protein-calorie malnutrition, severe   Sepsis affecting skin   Gangrene of foot   Pressure ulcer   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 If 7PM-7AM, please contact night-coverage at [email or web address], password [facility] 10/17/2014, 11:01 AM  LOS: 23 days

## 2014-10-17 NOTE — Progress Notes (Signed)
[facility] Rounding Note  Subjective:  Had dialysis in the chair yesterday He says "I guess I didn't have any problems" Charted net UF of 1.6 liters Weights are not consistent Had some chest pressure during the night "like something sitting on it" I don't see that any ECG was done Had CXR this AM that just shows CM, no infiltrate, maybe some vascular congestion Says he has no chest pain or SOB at this time Having some pain on his bottom - has an early sacral decubitus  06/23 0701 - 06/24 0700 In: 1581 [P.O.:1294; I.V.:3; Blood:254] Out: 1660   Filed Weights   10/14/14 1224 10/16/14 1358 10/16/14 2054  Weight: 69.8 kg (153 lb 14.1 oz) 72 kg (158 lb 11.7 oz) 72.757 kg (160 lb 6.4 oz)   Physical Exam:  Blood pressure 130/60, pulse 66, temperature 98.1 F (36.7 C), temperature source Oral, resp. rate 17, height  (1.803 m), weight 73.21 kg (161 lb 6.4 oz), SpO2 99 %. NAD Multiple tattoos S1S2 No S3 Lungs clear anteriorly Left upper arm AVF + bruit and thrill. Incisions healing Right IJ TDC  Abd non tender. + abdominal wall edema but less. Still distended Right AKA stump wrapped + edema of stump above the wrap with edema to the hip Now just trace edema of LLE Skin breakdown over sacrum - not covered - about size of a quarter   Scheduled Meds: . sodium chloride   Intravenous Once  . amiodarone  400 mg Oral BID  . calcium acetate  1,334 mg Oral TID WC  . collagenase   Topical Daily  . darbepoetin (ARANESP) injection - DIALYSIS  100 mcg Intravenous Q Tue-HD  . feeding supplement (NEPRO CARB STEADY)  237 mL Oral BID BM  . feeding supplement (PRO-STAT SUGAR FREE 64)  30 mL Oral BID  . folic acid  1 mg Oral Daily  . gabapentin  300 mg Oral QHS  . insulin aspart  0-9 Units Subcutaneous TID WC  . metoprolol tartrate  25 mg Oral BID  . multivitamin  1 tablet Oral QHS  . OxyCODONE  20 mg Oral Q12H  . oxyCODONE-acetaminophen  1 tablet Oral 3 times per day  .  pantoprazole  40 mg Oral Q1200  . polyethylene glycol  17 g Oral BID  . senna-docusate  1 tablet Oral BID  . sodium chloride  3 mL Intravenous Q12H  . thiamine  100 mg Oral Daily  . vancomycin  125 mg Oral QID  . Warfarin - Pharmacist Dosing Inpatient   Does not apply q1800   Continuous Infusions:   PRN Meds:.HYDROmorphone (DILAUDID) injection, nitroGLYCERIN, ondansetron (ZOFRAN) IV   Recent Labs Lab 10/14/14 0828 10/15/14 0137 10/16/14 0548  NA 131* 134* 134*  K 4.3 3.6 3.9  CL 95* 96* 94*  CO2 GLUCOSE 110* 137* 124*  BUN 44* 23* 36*  CREATININE 5.54* 3.62* 4.65*  CALCIUM 9.0 8.7* 9.1  PHOS 4.9* 3.6 4.1    Recent Labs Lab 10/14/14 0328 10/15/14 0137 10/16/14 0548  WBC 9.4 9.7 9.3  HGB 8.3* 8.4* 7.5*  HCT 25.3* 26.5* 23.5*  MCV 88.5 89.8 89.7  PLT 125* 118* 134*      Background: 49 yo prev followed by [facility] for baseline CKD4/5  with background of DM, HTN, GERD, CAD with prior CABG 2010, combined systolic and diastolic CHF (EF 16-10% with grade 3 diastolic dysfunction) severe PAD, substance use. Admitted with leg pain and swelling, s/p  AKA RLE 10/06/14. Creatinine on admission 7's, BUN 140's. Deemed new ESRD and HD initiated 09/28/14.  A/P 1. New ESRD 1. Started 6/5 via left fem cath. R IJ TDC placed 6/8. 2. S/p L BC AVF 6/17 VVS Early - nice bruit and thrill 3. CLIPPED  to [facility] - TTS - second shift - can start there on Tuesday - able to sit in chair for HD and transfer with sliding board 4. 4h, 4K, 2.25 Ca, 36 degrees, 400/800. Tight heparin. 5. EDW to be established. Hosp weights inconsistent. Try for 3 liters UF with Saturday treatment 2. HTN 1.  on MTP 3. Anemia 1. Aranesp 100 qTues 2. S/p Feraheme as well 3. Hb drifting down - increase Aranesp 150; may need blood with HD in the AM; give 1 unit if Hb <7.5 4. PAD and ischemic RLE, 1. 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 CKD-BMD 1. PTH = 97 on 09/29/14 No VDRA needed 2. PhosLo 2ac  has normalized phosphorus 6. AMS, improved 7. C Diff colitis: PO Vanc. End date 10/23/14 8. Episode of VT req cardioversion 6/6, on amio 9. AFib on anticoagulation (heparin->warfarin per pharmacy) He will need to have INR managed by Cardiology at discharge - just thinking ahead so these arrangements can be made. INR therapeutic. Off heparin.. 10. Systolic (EF 20-25) and diastolic (Gr 3)  HF: cont to lower EDW as tolerated - BP will be limiting. Add midodrine 10 mg pre dialysis 11. CAD s/p CABG (2010) 12. DM2 - SSI 13. Early sacral decubitus. Duoderm. Primary service aware.  [name], MD [facility] [phone] Pager 10/17/2014, 10:25 AM

## 2014-10-17 NOTE — Progress Notes (Signed)
Patient c/o heartburn and requests for tums.T. Callahan,NP notified. Will continue to monitor. Shantina Chronister, Drinda Butts, Charity fundraiser

## 2014-10-17 NOTE — Consult Note (Signed)
WOC consult requested for sacral wound.  This was performed on 6/21; please refer to previous progress notes for assessment and measurement.  Continue present plan of care with [facility] for chemical debridement of nonviable tissue. Please re-consult if further assistance is needed.  Thank-you,  Cammie Mcgee MSN, RN, CWOCN, [place], CNS [phone]

## 2014-10-17 NOTE — Progress Notes (Signed)
Patient c/o "it's hard to take a breath". RN asked if he is having chest pain,patient denies,states "it's hard to take a breath because it feels like my chest is clogged",again patient denies any chest pain.O2 sat on RA is 100%,BP WNL,in epic.Text paged T. Claiborne Billings. Will continue to monitor. Galan Ghee, Drinda Butts, Charity fundraiser

## 2014-10-17 NOTE — Progress Notes (Addendum)
12pm CSW spoke with [facility] who states that the pt can start dialysis tomorrow if the pt family can sign paperwork at the kidney center.  CSW contacted Dory Horn (pt sister) who states that she can go to the kidney center today to sign paperwork before 4pm.    Per [facility] pt can start dialysis tomorrow pending completion of paperwork.  11am CSW found in notes that patient is accepted at [facility] but does not have a chair time arranged yet- CSW will continue to follow- pt could not DC to [facility] until pt has a chair time.  10:30am CSW received consult to arrange daily IR check and schedule Coumadin clinic with [facility] on Monday.  CSW informed RNCM of consult.  CSW will continue to follow patient for return to SNF- anticipated to DC today.  Merlyn Lot, LCSWA Clinical Social Worker [phone]

## 2014-10-18 LAB — RENAL FUNCTION PANEL
Albumin: 2.1 g/dL — ABNORMAL LOW (ref 3.5–5.0)
Anion gap: 13 (ref 5–15)
BUN: 42 mg/dL — AB (ref 6–20)
CO2: 26 mmol/L (ref 22–32)
Calcium: 9 mg/dL (ref 8.9–10.3)
Chloride: 97 mmol/L — ABNORMAL LOW (ref 101–111)
Creatinine, Ser: 4.24 mg/dL — ABNORMAL HIGH (ref 0.61–1.24)
GFR calc Af Amer: 17 mL/min — ABNORMAL LOW (ref >60)
GFR calc non Af Amer: 15 mL/min — ABNORMAL LOW (ref >60)
Glucose, Bld: 137 mg/dL — ABNORMAL HIGH (ref 65–99)
PHOSPHORUS: 3.4 mg/dL (ref 2.5–4.6)
Potassium: 3.9 mmol/L (ref 3.5–5.1)
SODIUM: 136 mmol/L (ref 135–145)

## 2014-10-18 LAB — GLUCOSE, CAPILLARY
Glucose-Capillary: 119 mg/dL — ABNORMAL HIGH (ref 65–99)
Glucose-Capillary: 137 mg/dL — ABNORMAL HIGH (ref 65–99)
Glucose-Capillary: 153 mg/dL — ABNORMAL HIGH (ref 65–99)
Glucose-Capillary: 113 mg/dL — ABNORMAL HIGH (ref 65–99)

## 2014-10-18 LAB — PROTIME-INR
INR: 3.43 — ABNORMAL HIGH (ref 0.00–1.49)
Prothrombin Time: 33.9 seconds — ABNORMAL HIGH (ref 11.6–15.2)

## 2014-10-18 LAB — CBC
HEMATOCRIT: 25.1 % — AB (ref 39.0–52.0)
Hemoglobin: 8 g/dL — ABNORMAL LOW (ref 13.0–17.0)
MCH: 28.6 pg (ref 26.0–34.0)
MCHC: 31.9 g/dL (ref 30.0–36.0)
MCV: 89.6 fL (ref 78.0–100.0)
PLATELETS: 124 10*3/uL — AB (ref 150–400)
RBC: 2.8 MIL/uL — ABNORMAL LOW (ref 4.22–5.81)
RDW: 20.2 % — AB (ref 11.5–15.5)
WBC: 10.1 10*3/uL (ref 4.0–10.5)

## 2014-10-18 MED ORDER — INSULIN ASPART 100 UNIT/ML ~~LOC~~ SOLN: 0.0000 [IU] | Freq: Three times a day (TID) | SUBCUTANEOUS | Status: DC

## 2014-10-18 MED ORDER — OXYCODONE HCL 5 MG PO TABS
ORAL_TABLET | ORAL | Status: AC
  Filled 2014-10-18: qty 2

## 2014-10-18 MED ORDER — MIDODRINE HCL 5 MG PO TABS
ORAL_TABLET | ORAL | Status: AC
  Filled 2014-10-18: qty 2

## 2014-10-18 MED ORDER — MIDODRINE HCL 10 MG PO TABS: 10.0000 mg | ORAL_TABLET | ORAL | Status: DC

## 2014-10-18 NOTE — Progress Notes (Signed)
[facility] Rounding Note  Subjective:  Seen in dialysis Transferred into chair on sliding board Feels better since "washed my face and washed my hair" Having some myoclonic jerks this AM "they done something to me" No further chest pressure or SOB  06/24 0701 - 06/25 0700 In: 1523 [P.O.:1460; I.V.:3] Out: 281 [Urine:275; Stool:6]  Filed Weights   10/16/14 1358 10/16/14 2054 10/17/14 2209  Weight: 72 kg (158 lb 11.7 oz) 72.757 kg (160 lb 6.4 oz) 70.761 kg (156 lb)   Physical Exam:  Blood pressure 130/60, pulse 66, temperature 98.1 F (36.7 C), temperature source Oral, resp. rate 17, height  (1.803 m), weight 73.21 kg (161 lb 6.4 oz), SpO2 99 %. NAD Sitting in the recliner for HD Multiple tattoos S1S2 No S3 Lungs clear anteriorly Left upper arm AVF + bruit and thrill. Incisions healing Right IJ TDC in use and running well at 400  Abd non tender. + abdominal wall edema but less. Still distended Right AKA stump pink along suture line, staples in place. 1+ stump edema Now just trace edema of LLE Skin breakdown over sacrum -not examined  Scheduled Meds: . amiodarone  400 mg Oral BID  . calcium acetate  1,334 mg Oral TID WC  . collagenase   Topical Daily  . [START ON 10/21/2014] darbepoetin (ARANESP) injection - DIALYSIS  150 mcg Intravenous Q Tue-HD  . feeding supplement (NEPRO CARB STEADY)  237 mL Oral BID BM  . feeding supplement (PRO-STAT SUGAR FREE 64)  30 mL Oral BID  . folic acid  1 mg Oral Daily  . gabapentin  300 mg Oral QHS  . insulin aspart  0-9 Units Subcutaneous TID WC  . metoprolol tartrate  25 mg Oral BID  . midodrine      . midodrine  10 mg Oral Q T,Th,Sa-HD  . multivitamin  1 tablet Oral QHS  . OxyCODONE  20 mg Oral Q12H  . pantoprazole  40 mg Oral Q1200  . sodium chloride  3 mL Intravenous Q12H  . thiamine  100 mg Oral Daily  . vancomycin  125 mg Oral QID  . Warfarin - Pharmacist Dosing Inpatient   Does not apply q1800   Continuous  Infusions:   PRN Meds:.nitroGLYCERIN, ondansetron (ZOFRAN) IV, oxyCODONE   Recent Labs Lab 10/15/14 0137 10/16/14 0548 10/18/14 0305  NA 134* 134* 136  K 3.6 3.9 3.9  CL 96* 94* 97*  CO2 GLUCOSE 137* 124* 137*  BUN 23* 36* 42*  CREATININE 3.62* 4.65* 4.24*  CALCIUM 8.7* 9.1 9.0  PHOS 3.6 4.1 3.4    Recent Labs Lab 10/16/14 0548 10/17/14 1000 10/18/14 0305  WBC 9.3 10.7* 10.1  HGB 7.5* 8.7* 8.0*  HCT 23.5* 27.2* 25.1*  MCV 89.7 89.8 89.6  PLT 134* 122* 124*      Background: 49 yo prev followed by [facility] for baseline CKD4/5  with background of DM, HTN, GERD, CAD with prior CABG 2010, combined systolic and diastolic CHF (EF 16-10% with grade 3 diastolic dysfunction) severe PAD, substance use. Admitted with leg pain and swelling, s/p  AKA RLE 10/06/14. Creatinine on admission 7's, BUN 140's. Deemed new ESRD and HD initiated 09/28/14.  A/P 1. New ESRD 1. Started 6/5 via left fem cath. R IJ TDC placed 6/8. 2. S/p L BC AVF 6/17 VVS Early - nice bruit and thrill 3. CLIPPED  to [facility] - TTS - second shift - can start there on  Tuesday - able to sit in chair for HD and transfer with sliding board 4. 4h, 4K, 2.25 Ca, 36 degrees, 400/800. Tight heparin. 5. EDW to be established. Hosp weights inconsistent. Try for 3 liters UF with today's treatment. Appears no pre HD weight done. Will ask for post HD weight. 2. HTN 1.  on MTP 3. Anemia 1. Aranesp 100 qTues 2. S/p Feraheme as well 3. Hb drifting down - increased Aranesp 150; Pre HD Hb today 8 4. PAD and ischemic RLE, 1. 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 CKD-BMD 1. PTH = 97 on 09/29/14 No VDRA needed 2. PhosLo 2ac has normalized phosphorus 6. AMS, improved 7. C Diff colitis: PO Vanc. End date 10/23/14 8. Episode of VT req cardioversion 6/6, on amio 9. AFib on anticoagulation (heparin->warfarin per pharmacy) He will need to have INR managed by Cardiology at discharge - just thinking ahead so these  arrangements can be made. INR therapeutic. Off heparin.. 10. Systolic (EF 20-25) and diastolic (Gr 3)  HF: cont to lower EDW as tolerated - BP will be limiting. Added midodrine 10 mg pre dialysis 11. CAD s/p CABG (2010) 12. DM2 - SSI 13. Early sacral decubitus. Duoderm. Primary service aware. 14. Myoclonic jerks - had been on rather large dose of neurontin which was reduced 6/23 - I recommend stopping altogether - see no other potentially offensive medicines  [name], MD [facility] [phone] Pager 10/18/2014, 8:07 AM

## 2014-10-18 NOTE — Discharge Summary (Addendum)
Discharge Summary  [name] WUJ:[id number] DOB: 1965-07-22  PCP: [name], NP  Admit date: 09/24/2014 Discharge date: 10/18/2014  Time spent: >54mins  Recommendations for Outpatient Follow-up:  1. F/u with PMD for hospital discharge follow up 2. F/u with cardiology and coumadin clinic at [facility] for afib/chf. 3. F/u with vascular surgery 4. F/u with nephrology  Discharge Diagnoses:  Active Hospital Problems   Diagnosis Date Noted  . Bilateral lower leg cellulitis   . 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 Pressure ulcer 10/03/2014  . Gangrene of foot   . Sepsis affecting skin   . Protein-calorie malnutrition, severe 09/29/2014  . Chronic systolic CHF (congestive heart failure)   . Diabetes type 2, controlled   . Systolic CHF, chronic   . Metabolic encephalopathy   . Cellulitis of leg, right   . Cellulitis of leg, left   . Chronic systolic heart failure   . Cardiomyopathy   . Hypokalemia   . Blood poisoning   . Jaundice   . Peripheral artery disease   . CAD in native artery   . Essential hypertension   . HLD (hyperlipidemia)   . Diabetes type 2, uncontrolled   . Acute on chronic renal failure   . Chronic combined systolic and diastolic CHF (congestive heart failure)   . Combined systolic and diastolic congestive heart failure 09/24/2014  . Cellulitis of right leg 09/24/2014  . Sepsis 09/24/2014  . Cellulitis 09/24/2014  . Constipation 09/24/2014  . GERD (gastroesophageal reflux disease)   . PAD (peripheral artery disease) 11/20/2012  . 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 NSTEMI (non-ST elevated myocardial infarction) 03/19/2011  . Hyperlipidemia 03/19/2011  . HTN (hypertension) 03/19/2011  . DM2 (diabetes mellitus, type 2) 03/19/2011  . Acute renal failure superimposed on stage 4 chronic kidney disease  03/19/2011  . CAD (coronary artery disease) 03/19/2011    Resolved Hospital Problems   Diagnosis Date Noted Date Resolved  No resolved problems to display.    Discharge Condition: stable  Diet recommendation: renal diet  Filed Weights   10/16/14 1358 10/16/14 2054 10/17/14 2209  Weight: 72 kg (158 lb 11.7 oz) 72.757 kg (160 lb 6.4 oz) 70.761 kg (156 lb)    History of present illness:  [name] is a 49 y.o. male with PMH of hypertension, hyperlipidemia, diabetes mellitus, GERD, CAD(s/P of CABG 2010), combined systolic and diastolic congestive heart failure (EF 20-25% with grade 3 diastolic dysfunction), chronic kidney disease-stage IV, severe PAD, substance abuse, who presents with right leg swelling and pain on 6/1 to [place] hospital.  Patient is a poor historian. It seems that he has been having right leg pain and swelling in the past several days. His leg becomes red from foot to upper thigh medially. He does not have fever or chills. Patient was unable to ambulate over the past 2 days secondary to pain on the right leg. He reports a generalized weakness. States he has decreased urinary output and constipation, states he has not been eating and drinking regularly. Patient states that he is living with his mother.  In ED, patient was found to have WBC 13.8, tachycardia, normal temperature, stable blood pressure, AoCKD-IV, negative chest x-ray for acute abnormalities, ALP 168, AST 27, ALT 15, total bilirubin 4.4, lactate 1.85, INR 1.39, urinalysis with small amount of leukocytes. Patient is  admitted to inpatient for further evaluation and treatment. 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: left internal mammary artery to distal left anterior descending, SVG to Circ marginal vessel, SVG to PDA   HTN (hypertension)   Hyperlipidemia   DM2 (diabetes mellitus, type 2)   Acute renal  failure superimposed on stage 4 chronic kidney disease   CAD (coronary artery disease)   NSTEMI (non-ST elevated myocardial infarction)   PAD (peripheral artery disease)   GERD (gastroesophageal reflux disease)   Combined systolic and diastolic congestive heart failure   Cellulitis of right leg   Sepsis   Cellulitis   Constipation   Blood poisoning   Jaundice   Peripheral artery disease   CAD in native artery   Essential hypertension   HLD (hyperlipidemia)   Diabetes type 2, uncontrolled   Acute on chronic renal failure   Chronic combined systolic and diastolic CHF (congestive heart failure)   Metabolic encephalopathy   Cellulitis of leg, right   Cellulitis of leg, left   Chronic systolic heart failure   Cardiomyopathy   Hypokalemia   Systolic CHF, chronic   Chronic systolic CHF (congestive heart failure)   Diabetes type 2, controlled   Protein-calorie malnutrition, severe   Sepsis affecting skin   Gangrene of foot   Pressure ulcer   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 As this time it is resolved and patient is at baseline.  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  s/p Left AV fistula Brachial/cephalic (6/17) ready to use in 12 weeks [facility] - TTS - second shift - can start there on Tuesday - able to sit in chair for HD and transfer with sliding board  PAF/Episode of monomorphic VT / wide complex tachycardia  Patient had episode of wide complex tachycardia in the hospital which required cardioversion on 09/29/2014 ( code blue was called at the time, patient was transferred to ICU and stayed for a few days in the ICU).  Currently patient is in sinus rhythm and on oral amiodarone as  recommended by the EP.  On coumadin, Close outpatient f/u with cardiology to monitor INR   Acute onset Afib 6/6 Currently patient is in sinus rhythm and on oral amiodarone as recommended by the EP.  acute onset A. fib, chads 2vasc score is 3,  patient was bridged with IV heparin. on coumadin per pharmacy while in the hospital. INT 3.43 on 6/25.  Hold coumadin, restart at low dose 2.5mg  po qd from 6/27. Daily INR check, report resulted to [facility] and coumadin clinic early next week with [facility].  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 -outpatient vascular surgery follow up in three weeks from discharge  Bilateral lower extremity cellulitis w/ sepsis due to unspecified organism Patient presented  with bilateral lower extremity cellulitis and was started on IV antibiotic vanc/zosyn finished two weeks of treatment as of 6/15. Patient continues to be afebrile.  Jaundice  acute Hepatitis panel negative - RPR negative - anti-nuclear antibody negative - HIV negative -Hepatic ultrasound revealed normal appearing parenchyma with ascites (likely due to ESRD)  -lft trending towards normal  CAD s/p CABG 2010 Chronic Systolic CHF / Cardiomyopathy  -TTE 02/21/14 showed EF 20-25% with grade 3 diastolic dysfunction.  -seen by Dr. Royann Shivers on 08/07/14 w/ decision regarding cardiac device therapy depending heavily on his decision regarding dialysis - ICD was to be considered if he started dialysis. -6/3 TTE shows cardiomyopathy with worsening EF now at 20%  -EP does not feel he is presently a candidate for aggressive  procedures -Volume management per hemodialysis -close outpatient follow up with cardiology and coumadin clinic  HTN Blood pressures currently reasonably controlled   Hyperlipidemia  Lipid panel abnormal  DM type-II  -6/3 A1c 6.4 - CBG currently controlled on ssi  cdiff on oral vanc, reported diarrhea has stopped, last bm last night. Po vanc total of 14days from 6/16  Code Status: Full code Family Communication: patient  Disposition Plan: Skilled nursing facility on Saturday after HD   Consultants: Nephrology Vasc Surgery EP PCCM  Procedures:  Echocardiogram- EF 20%, mild LVH 120J synchronized defibrillation 6/6 Left AV fistula Brachial/cephalic 6/17 Dr. Arbie Cookey  Right AKA 6/13 Dr. [facility]   Antibiotics: Vancomycin 6 /1 > 6/15 Zosyn 6/1 > 6/15 Oral vanc from 6/16 for total of two weeks  Discharge Exam: BP 119/58 mmHg  Pulse 68  Temp(Src) 98.6 F (37 C) (Oral)  Resp 16  Ht 5\' 11"  (1.803 m)  Wt 70.761 kg (156 lb)  BMI 21.77 kg/m2  SpO2 99%   General: Appears in no acute distress, chronically ill, sitting up in chair.  Cardiovascular: S1-S2 is regular, no murmurs rubs or gallops  Respiratory: Clear to auscultation bilaterally, no wheezing or crackles  Abdomen: Soft, nontender, no organomegaly  Musculoskeletal: Status post right AKA, right stump in dressing    Discharge Instructions You were cared for by a hospitalist during your hospital stay. If you have any questions about your discharge medications or the care you received while you were in the hospital after you are discharged, you can call the unit and asked to speak with the hospitalist on call if the hospitalist that took care of you is not available. Once you are discharged, your primary care physician will handle any further medical issues. Please note that NO REFILLS for any discharge medications will be authorized once you are discharged, as it is imperative that you return to  your primary care physician (or establish a relationship with a primary care physician if you do not have one) for your aftercare needs so that they can reassess your need for medications and monitor your lab values.      Discharge Instructions    DME Other see comment    Complete by:  As directed   Donut cushion x1 to sit while in chair.     Diet - low sodium heart healthy    Complete by:  As directed   Carb modified     Diet renal [id number]    Complete by:  As directed      Increase activity slowly    Complete by:  As directed      Increase activity slowly    Complete by:  As directed  Medication List    STOP taking these medications        acyclovir 800 MG tablet  Commonly known as:  ZOVIRAX     calcium carbonate 750 MG chewable tablet  Commonly known as:  TUMS EX     furosemide 80 MG tablet  Commonly known as:  LASIX     hydrALAZINE 50 MG tablet  Commonly known as:  APRESOLINE     isosorbide mononitrate 60 MG 24 hr tablet  Commonly known as:  IMDUR     LEVEMIR 100 UNIT/ML injection  Generic drug:  insulin detemir     metoprolol 50 MG tablet  Commonly known as:  LOPRESSOR     sodium bicarbonate 650 MG tablet      TAKE these medications        amiodarone 400 MG tablet  Commonly known as:  PACERONE  Take 1 tablet (400 mg total) by mouth 2 (two) times daily.     calcium acetate 667 MG capsule  Commonly known as:  PHOSLO  Take 2 capsules (1,334 mg total) by mouth 3 (three) times daily with meals.     collagenase ointment  Commonly known as:  SANTYL  Apply topically daily.     Darbepoetin Alfa 100 MCG/0.5ML Sosy injection  Commonly known as:  ARANESP  Inject 0.5 mLs (100 mcg total) into the vein every Tuesday with hemodialysis.     feeding supplement (NEPRO CARB STEADY) Liqd  Take 237 mLs by mouth 2 (two) times daily between meals.     feeding supplement (PRO-STAT SUGAR FREE 64) Liqd  Take 30 mLs by mouth 2 (two) times daily.      folic acid 1 MG tablet  Commonly known as:  FOLVITE  Take 1 tablet (1 mg total) by mouth daily.     insulin aspart 100 UNIT/ML injection  Commonly known as:  novoLOG  Inject 0-9 Units into the skin 3 (three) times daily with meals. Before each meal 3 times a day, 140-199 - 2 units, 200-250 - 4 units, 251-299 - 6 units,  300-349 - 8 units,  350 or above 10 units. Insulin PEN if approved, provide syringes and needles if needed.     Iron 325 (65 FE) MG Tabs  Take 325 mg by mouth daily.     midodrine 10 MG tablet  Commonly known as:  PROAMATINE  Take 1 tablet (10 mg total) by mouth Every Tuesday,Thursday,and Saturday with dialysis.     multivitamin Tabs tablet  Take 1 tablet by mouth at bedtime.     nitroGLYCERIN 0.4 MG SL tablet  Commonly known as:  NITROSTAT  Place 1 tablet (0.4 mg total) under the tongue once.     Oxycodone HCl 10 MG Tabs  Take 1 tablet (10 mg total) by mouth every 6 (six) hours as needed. For pain     pantoprazole 40 MG tablet  Commonly known as:  PROTONIX  Take 40 mg by mouth daily.     polyethylene glycol packet  Commonly known as:  MIRALAX / GLYCOLAX  Take 17 g by mouth daily as needed for moderate constipation.     senna-docusate 8.6-50 MG per tablet  Commonly known as:  Senokot-S  Take 1 tablet by mouth 2 (two) times daily.     vancomycin 125 MG capsule  Commonly known as:  VANCOCIN HCL  Take 1 capsule (125 mg total) by mouth 4 (four) times daily.     warfarin 2.5 MG tablet  Commonly known  as:  COUMADIN  Take 1 tablet (2.5 mg total) by mouth daily. First dose start from Sunday 6/25  Start taking on:  10/19/2014       No Known Allergies Follow-up Information    Follow up with Gretta Began, MD In 3 weeks.   Specialties:  Vascular Surgery, Cardiology   Why:  s/o right aka , s/p left AVF   Contact information:   [address] [place] [address] [phone]       Follow up with Thurmon Fair, MD In 1 week.   Specialty:  Cardiology   Why:   afib newly started on amio/coumadin, chf   Contact information:   [address] [place] [address] [phone]       Follow up with [name], NP In 3 weeks.   Why:  hospital discharge follow up   Contact information:   [address] [place] [address] [phone]       Please follow up.   Why:  [facility] will follow this pt however they are unable to schedule an appointment at this time, they will call [facility] with the earliest available appointment for this pt.        The results of significant diagnostics from this hospitalization (including imaging, microbiology, ancillary and laboratory) are listed below for reference.    Significant Diagnostic Studies: US Renal  09/25/2014   CLINICAL DATA:  Acute on chronic renal failure.  EXAM: RENAL / URINARY TRACT ULTRASOUND COMPLETE  COMPARISON:  Abdominal ultrasound 01/14/2014  FINDINGS: Right Kidney:  Length: 9.4 cm. Thinning of the renal parenchyma with increased cortical echogenicity. No mass or hydronephrosis visualized.  Left Kidney:  Length: 9.2 cm. Thinning of the renal parenchyma and increased renal echogenicity. No mass or hydronephrosis visualized.  Bladder:  Appears normal for degree of bladder distention.  Incidental: There is a moderate volume of ascites in the abdomen and pelvis.  IMPRESSION: 1. Small echogenic kidneys consistent chronic medical renal disease. No obstructive uropathy. 2. Moderate volume of intra-abdominal and pelvic ascites.   Electronically Signed   By: Rubye Oaks M.D.   On: 09/25/2014 01:16   Dg Chest Port 1 View  10/17/2014   CLINICAL DATA:  Shortness of breath, chest pressure last night  EXAM: PORTABLE CHEST - 1 VIEW  COMPARISON:  10/01/2014  FINDINGS: Cardiomegaly again noted. Status post CABG. Dual lumen right IJ catheter is unchanged in position. No acute infiltrate or pulmonary edema. There is no pneumothorax.  IMPRESSION: No active disease. Cardiomegaly  again noted. Status post CABG. Stable dual lumen right IJ catheter position. No pneumothorax.   Electronically Signed   By: Natasha Mead M.D.   On: 10/17/2014 09:09   Dg Chest Port 1 View  10/01/2014   CLINICAL DATA:  Status post catheter insertion  EXAM: PORTABLE CHEST - 1 VIEW  COMPARISON:  09/27/2014  FINDINGS: There is a dual-lumen right-sided central venous catheter with the tip in satisfactory position. There is mild bilateral interstitial thickening. There is no pleural effusion or pneumothorax. There is stable cardiomegaly. There is evidence of prior CABG.  The osseous structures are unremarkable.  IMPRESSION: Dual-lumen right-sided central venous catheter with the tips in satisfactory position. No pneumothorax.  Mild pulmonary edema.   Electronically Signed   By: Elige Ko   On: 10/01/2014 12:04   Dg Chest Portable 1 View  09/27/2014   CLINICAL DATA:  Central line placement  EXAM: PORTABLE CHEST - 1 VIEW  COMPARISON:  09/24/2014  FINDINGS: There is a central line from an inferior approach which terminates in the right atrium. Mild pulmonary edema pattern. No pneumothorax.  IMPRESSION: Central venous line from a femoral approach with tip in the right atrium.   Electronically Signed   By: Genevive Bi M.D.   On: 09/27/2014 19:30   Dg Chest Port 1 View  09/24/2014   CLINICAL DATA:  Weakness and lower extremity edema.  EXAM: PORTABLE CHEST - 1 VIEW  COMPARISON:  09/28/2013  FINDINGS: Stable mild cardiac enlargement and evidence of prior CABG. Lung volumes are low bilaterally. There is no evidence of pulmonary edema, consolidation, pneumothorax, nodule or pleural fluid.  IMPRESSION: Stable mild cardiomegaly.  Low lung volumes without acute findings.   Electronically Signed   By: Irish Lack M.D.   On: 09/24/2014 19:38   US Abdomen Limited Ruq  10/05/2014   CLINICAL DATA:  Jaundice  EXAM: US ABDOMEN LIMITED - RIGHT UPPER QUADRANT  COMPARISON:  None  FINDINGS: Gallbladder:  Well distended with  echogenic material consistent with gallbladder sludge. No gallstones are seen. Diffuse ascites is seen contributing to mild wall thickening of 3.7 mm.  Common bile duct:  Diameter: 3.2 mm.  Liver:  No focal lesion identified. Within normal limits in parenchymal echogenicity.  IMPRESSION: Diffuse ascites.  Gallbladder sludge.   Electronically Signed   By: Alcide Clever M.D.   On: 10/05/2014 09:04    Microbiology: Recent Results (from the past 240 hour(s))  Clostridium Difficile by PCR (not at [facility])     Status: Abnormal   Collection Time: 10/09/14  4:33 AM  Result Value Ref Range Status   C difficile by pcr POSITIVE (A) NEGATIVE Final    Comment: CRITICAL RESULT CALLED TO, READ BACK BY AND VERIFIED WITH: RN HAYWOOD,K AT [id number] [id number] [name]      Labs: Basic Metabolic Panel:  Recent Labs Lab 10/14/14 0828 10/15/14 0137 10/16/14 0548 10/18/14 0305  NA 131* 134* 134* 136  K 4.3 3.6 3.9 3.9  CL 95* 96* 94* 97*  CO2 24 23 27 26   GLUCOSE 110* 137* 124* 137*  BUN 44* 23* 36* 42*  CREATININE 5.54* 3.62* 4.65* 4.24*  CALCIUM 9.0 8.7* 9.1 9.0  PHOS 4.9* 3.6 4.1 3.4   Liver Function Tests:  Recent Labs Lab 10/14/14 0828 10/15/14 0137 10/16/14 0548 10/18/14 0305  AST  --   --  22  --   ALT  --   --  16*  --   ALKPHOS  --   --  104  --   BILITOT  --   --  1.6*  --   PROT  --   --  5.8*  --   ALBUMIN 2.2* 2.2* 2.2*  2.2* 2.1*   No results for input(s): LIPASE, AMYLASE in the last 168 hours. No results for input(s): AMMONIA in the last 168 hours. CBC:  Recent Labs Lab 10/14/14 0328 10/15/14 0137 10/16/14 0548 10/17/14 1000 10/18/14 0305  WBC 9.4 9.7 9.3 10.7* 10.1  HGB 8.3* 8.4* 7.5* 8.7* 8.0*  HCT 25.3* 26.5* 23.5* 27.2* 25.1*  MCV 88.5 89.8 89.7 89.8 89.6  PLT 125* 118* 134* 122* 124*   Cardiac Enzymes: No results for input(s): CKTOTAL, CKMB, CKMBINDEX, TROPONINI in the last 168 hours. BNP: BNP (last 3 results)  Recent Labs  09/25/14 0410  BNP 2227.0*     ProBNP (last 3 results) No results for input(s): PROBNP in the last 8760 hours.  CBG:  Recent  Labs Lab 10/17/14 1150 10/17/14 1627 10/17/14 2213 10/18/14 0739 10/18/14 1235  GLUCAP 175* 121* 119* 137* 153*       Signed:  Edward Guthmiller MD, PhD  [facility] 10/18/2014, 4:53 PM

## 2014-10-18 NOTE — Procedures (Signed)
I have personally attended this patient's dialysis session.   No pre HD weight Goal 3 liters Using TDC 400 Midodrine 10 mg preHD  [name], MD [facility] [phone] Pager 10/18/2014, 8:30 AM

## 2014-10-18 NOTE — Progress Notes (Signed)
ANTICOAGULATION CONSULT NOTE - Follow Up Consult  Pharmacy Consult for coumadin Indication: atrial fibrillation  No Known Allergies  Patient Measurements: Height: 5\' 11"  (180.3 cm) Weight:  (uta in recliner) IBW/kg (Calculated) : 75.3  Vital Signs: Temp: 98.6 F (37 C) (06/25 0740) Temp Source: Oral (06/25 0740) BP: 119/58 mmHg (06/25 1152) Pulse Rate: 68 (06/25 1152)  Labs:  Recent Labs  10/16/14 0548 10/17/14 1000 10/18/14 0305  HGB 7.5* 8.7* 8.0*  HCT 23.5* 27.2* 25.1*  PLT 134* 122* 124*  LABPROT 31.1* 31.4* 33.9*  INR 3.06* 3.10* 3.43*  HEPARINUNFRC 0.48  --   --   CREATININE 4.65*  --  4.24*    Estimated Creatinine Clearance: 20.9 mL/min (by C-G formula based on Cr of 4.24).   Assessment: 49 YOM with new onset afib, bridged from IV heparin bridge to therapeutic coumadin. INR is supratherapeutic at 3.13 today. Coumadin dose was held 6/23 because the INR increased significantly from 1.98 to 3.06 after a few days of 4-5mg  doses which is most likely due to coumadin drug-drug interaction with oral amiodarone 400mg  po BID. Then given 2.5mg  on 6/24. Hgb has been low but stable, pltc stable at 124. No overt s/sx of bleeding reported.  Goal of Therapy:  INR 2-3 Monitor platelets by anticoagulation protocol: Yes   Plan:  Hold coumadin dose today Monitor daily INR, CBC, s/s of bleed  Milianna Ericsson J 10/18/2014,12:33 PM

## 2014-10-18 NOTE — Progress Notes (Signed)
10/18/2014 5:39 PM  [name] to be D/C'd Skilled nursing facility per MD order.  Discussed prescriptions and follow up appointments with the patient. Prescriptions given to patient, medication list explained in detail. Pt verbalized understanding.    Medication List    STOP taking these medications        acyclovir 800 MG tablet  Commonly known as:  ZOVIRAX     calcium carbonate 750 MG chewable tablet  Commonly known as:  TUMS EX     furosemide 80 MG tablet  Commonly known as:  LASIX     hydrALAZINE 50 MG tablet  Commonly known as:  APRESOLINE     isosorbide mononitrate 60 MG 24 hr tablet  Commonly known as:  IMDUR     LEVEMIR 100 UNIT/ML injection  Generic drug:  insulin detemir     metoprolol 50 MG tablet  Commonly known as:  LOPRESSOR     sodium bicarbonate 650 MG tablet      TAKE these medications        amiodarone 400 MG tablet  Commonly known as:  PACERONE  Take 1 tablet (400 mg total) by mouth 2 (two) times daily.     calcium acetate 667 MG capsule  Commonly known as:  PHOSLO  Take 2 capsules (1,334 mg total) by mouth 3 (three) times daily with meals.     collagenase ointment  Commonly known as:  SANTYL  Apply topically daily.     Darbepoetin Alfa 100 MCG/0.5ML Sosy injection  Commonly known as:  ARANESP  Inject 0.5 mLs (100 mcg total) into the vein every Tuesday with hemodialysis.     feeding supplement (NEPRO CARB STEADY) Liqd  Take 237 mLs by mouth 2 (two) times daily between meals.     feeding supplement (PRO-STAT SUGAR FREE 64) Liqd  Take 30 mLs by mouth 2 (two) times daily.     folic acid 1 MG tablet  Commonly known as:  FOLVITE  Take 1 tablet (1 mg total) by mouth daily.     insulin aspart 100 UNIT/ML injection  Commonly known as:  novoLOG  Inject 0-9 Units into the skin 3 (three) times daily with meals. Before each meal 3 times a day, 140-199 - 2 units, 200-250 - 4 units, 251-299 - 6 units,  300-349 - 8 units,  350 or above 10 units.  Insulin PEN if approved, provide syringes and needles if needed.     Iron 325 (65 FE) MG Tabs  Take 325 mg by mouth daily.     midodrine 10 MG tablet  Commonly known as:  PROAMATINE  Take 1 tablet (10 mg total) by mouth Every Tuesday,Thursday,and Saturday with dialysis.     multivitamin Tabs tablet  Take 1 tablet by mouth at bedtime.     nitroGLYCERIN 0.4 MG SL tablet  Commonly known as:  NITROSTAT  Place 1 tablet (0.4 mg total) under the tongue once.     Oxycodone HCl 10 MG Tabs  Take 1 tablet (10 mg total) by mouth every 6 (six) hours as needed. For pain     pantoprazole 40 MG tablet  Commonly known as:  PROTONIX  Take 40 mg by mouth daily.     polyethylene glycol packet  Commonly known as:  MIRALAX / GLYCOLAX  Take 17 g by mouth daily as needed for moderate constipation.     senna-docusate 8.6-50 MG per tablet  Commonly known as:  Senokot-S  Take 1 tablet by mouth 2 (two) times  daily.     vancomycin 125 MG capsule  Commonly known as:  VANCOCIN HCL  Take 1 capsule (125 mg total) by mouth 4 (four) times daily.     warfarin 2.5 MG tablet  Commonly known as:  COUMADIN  Take 1 tablet (2.5 mg total) by mouth daily. First dose start from Sunday 6/25  Start taking on:  10/19/2014        Filed Vitals:   10/18/14 1152  BP: 119/58  Pulse: 68  Temp:   Resp: 16    Skin clean, dry and intact without evidence of skin break down, no evidence of skin tears noted. IV catheter discontinued intact. Site without signs and symptoms of complications. Dressing and pressure applied. Pt denies pain at this time. No complaints noted.  An After Visit Summary was printed and given to the patient. Patient escorted via stretcher, and D/C SNF via EMS.  [employer], RN-BC, [employer] [facility] Phone [phone]

## 2014-10-18 NOTE — Clinical Social Work Placement (Signed)
CLINICAL SOCIAL WORK PLACEMENT  NOTE  Date:  10/18/2014  Patient Details  Name: [name] MRN: [id number] Date of Birth: 09-03-1965  Clinical Social Work is seeking post-discharge placement for this patient at the Skilled  Nursing Facility level of care (*CSW will initial, date and re-position this form in  chart as items are completed):  Yes   Patient/family provided with [facility] Social Work Department's list of facilities offering this level of care within the geographic area requested by the patient (or if unable, by the patient's family).  Yes   Patient/family informed of their freedom to choose among providers that offer the needed level of care, that participate in Medicare, Medicaid or managed care program needed by the patient, have an available bed and are willing to accept the patient.  Yes   Patient/family informed of [facility]'s ownership interest in [facility] and [facility], as well as of the fact that they are under no obligation to receive care at these facilities.  PASRR submitted to EDS on 09/30/14     PASRR number received on 09/30/14     Existing PASRR number confirmed on       FL2 transmitted to all facilities in geographic area requested by pt/family on 10/02/14     FL2 transmitted to all facilities within larger geographic area on       Patient informed that his/her managed care company has contracts with or will negotiate with certain facilities, including the following:        Yes   Patient/family informed of bed offers received.  Patient chooses bed at [facility] (Second choice is [facility]- will not accept on weekend; [facility] declined patient)     Physician recommends and patient chooses bed at      Patient to be transferred to [facility] on 10/18/14.  Patient to be transferred to facility by Ambulance Sh[facility] Mons)     Patient family notified on 10/18/14 of transfer.  Name of family member notified:  Sister:   Sherri     PHYSICIAN Please sign FL2, Please prepare priority discharge summary, including medications, Please prepare prescriptions     Additional Comment: Patient was scheduled to go to [facility] this afternoon but prior to EMS picking up patient- the facility withdrew their bed offer as patient is being treated for c-diff and is on day 11 of a 14 day course of oral vancomycin. They did not have a private room to put him in and could not cohort him with another c-diff patient.  CSW notified patient's sister Roanna Raider who was extremely upset with this as she had evaluated, toured and assessed [facility] and decided it was an acceptable place for her brother. Thus, other bed offers now had to be considered and due to the weekend and need for a private room- clinicals were updated and resent to [facility] SNFs.  Bed offer in place from [facility] and daughter researched facility and gave approval.  Prior to d/c- this facility also decided to withdraw bed offer as patient has an unstageable bed sore on his sacrum and they felt that they had too many patient's with this issue right now.  The later changed withdrawal and agreed to accept patient. Nursing notified to call report.  Patient to sign himself in as he is alert and oriented. Sister aware of d/c and pleased with current bed offer.  EMS arranged and CSW signing off.  Lorri Frederick. Shellene Sweigert, LCSW  [phone]     _______________________________________________ Darylene Price, LCSW 10/18/2014, 6:55 PM

## 2014-10-18 NOTE — Progress Notes (Signed)
Report called to Inetta Fermo at [facility].  All questions answered.

## 2014-10-20 ENCOUNTER — Non-Acute Institutional Stay (SKILLED_NURSING_FACILITY): Payer: Medicare Other | Admitting: Adult Health

## 2014-10-20 ENCOUNTER — Encounter: Payer: Self-pay | Admitting: Adult Health

## 2014-10-20 DIAGNOSIS — A047 Enterocolitis due to Clostridium difficile: Secondary | ICD-10-CM | POA: Diagnosis not present

## 2014-10-20 DIAGNOSIS — N186 End stage renal disease: Secondary | ICD-10-CM

## 2014-10-20 DIAGNOSIS — E1165 Type 2 diabetes mellitus with hyperglycemia: Secondary | ICD-10-CM | POA: Diagnosis not present

## 2014-10-20 DIAGNOSIS — A0472 Enterocolitis due to Clostridium difficile, not specified as recurrent: Secondary | ICD-10-CM

## 2014-10-20 DIAGNOSIS — N189 Chronic kidney disease, unspecified: Secondary | ICD-10-CM | POA: Diagnosis not present

## 2014-10-20 DIAGNOSIS — K5900 Constipation, unspecified: Secondary | ICD-10-CM | POA: Diagnosis not present

## 2014-10-20 DIAGNOSIS — I953 Hypotension of hemodialysis: Secondary | ICD-10-CM

## 2014-10-20 DIAGNOSIS — K219 Gastro-esophageal reflux disease without esophagitis: Secondary | ICD-10-CM | POA: Diagnosis not present

## 2014-10-20 DIAGNOSIS — I2510 Atherosclerotic heart disease of native coronary artery without angina pectoris: Secondary | ICD-10-CM

## 2014-10-20 DIAGNOSIS — I739 Peripheral vascular disease, unspecified: Secondary | ICD-10-CM | POA: Diagnosis not present

## 2014-10-20 DIAGNOSIS — R5381 Other malaise: Secondary | ICD-10-CM

## 2014-10-20 DIAGNOSIS — I5022 Chronic systolic (congestive) heart failure: Secondary | ICD-10-CM | POA: Diagnosis not present

## 2014-10-20 DIAGNOSIS — Z992 Dependence on renal dialysis: Secondary | ICD-10-CM

## 2014-10-20 DIAGNOSIS — R17 Unspecified jaundice: Secondary | ICD-10-CM | POA: Diagnosis not present

## 2014-10-20 DIAGNOSIS — I480 Paroxysmal atrial fibrillation: Secondary | ICD-10-CM

## 2014-10-20 DIAGNOSIS — E43 Unspecified severe protein-calorie malnutrition: Secondary | ICD-10-CM

## 2014-10-20 DIAGNOSIS — IMO0002 Reserved for concepts with insufficient information to code with codable children: Secondary | ICD-10-CM

## 2014-10-20 DIAGNOSIS — Z7901 Long term (current) use of anticoagulants: Secondary | ICD-10-CM

## 2014-10-20 DIAGNOSIS — D631 Anemia in chronic kidney disease: Secondary | ICD-10-CM

## 2014-10-20 NOTE — Progress Notes (Signed)
Patient ID: [name], male   DOB: January 24, 1966, 49 y.o.   MRN: [id number]   10/20/2014  Facility:  Nursing Home Location:  [facility] Room Number: 706-P LEVEL OF CARE:  SNF (31)   Chief Complaint  Patient presents with  . Hospitalization Follow-up    Physical deconditioning, ESRD, PAF, PAD, jaundice, CAD S/P CABG, CHF, diabetes mellitus, C. difficile enteritis, anemia, hypotension, GERD, constipation and protein calorie malnutrition    HISTORY OF PRESENT ILLNESS:  This is a 49 year old male who has been [facility] to [facility]. He has PMH of chronic systolic CHF, with EF 20-25%, hypertension, hyperlipidemia, diabetes mellitus, GERD, CAD S/P CABG in 2010, ESRD, severe PAD and substance abuse. He presented to the ED with right leg swelling and pain for several days. His leg has become red from foot to upper thigh. He was sent able to ambulate 2 days on the right leg. He then had generalized weakness, decreased urinary output and constipation.   He has been admitted for a short-term rehabilitation.  PAST MEDICAL HISTORY:  Past Medical History  Diagnosis Date  . Coronary artery disease 07/05/2011    2D ECHO - EF ~40%, moderate concentric LV hypertrophy, LA moderately dilated, mild-moderate septal and inferior wall hypokinesis  . Hypertension   . CHF (congestive heart failure)   . Diabetes mellitus     Insulin dependent  . Cardiomyopathy   . Chronic kidney disease (CKD), stage IV (severe)   . Claudication 05/09/2011    Right and left anterior tibial arteries and Left SFA-occluded; Right CIA-<50% diameter reduction; Right Deep Profunda-70-99% diameter reduction; Right SFA->60% diameter reduction; Right Distal Popliteal/Tibial Artery- >60% diameter reduction; Left CFA and Profunda- >50% diameter reduction  . S/P CABG (coronary artery bypass graft) 03/23/2011`    STRESS TEST - LV EF 29%, mild reversible within the apical  segment of the anterior and anterolateral wall, small infarct in the inferolateral wall, global hypokinesia  . GERD (gastroesophageal reflux disease)     CURRENT MEDICATIONS: Reviewed per MAR/see medication list  No Known Allergies   REVIEW OF SYSTEMS:  GENERAL: no change in appetite, no fatigue, no weight changes, no fever, chills or weakness RESPIRATORY: no cough, SOB, DOE, wheezing, hemoptysis CARDIAC: no chest pain, edema or palpitations GI: no abdominal pain, diarrhea, constipation, heart burn, nausea or vomiting  PHYSICAL EXAMINATION  GENERAL: no acute distress, normal body habitus SKIN:  Sacral presure ulcer, stage 3; left shin wound covered with dry dressing, right AKA with dressing EYES: conjunctivae normal, sclerae normal, normal eye lids NECK: supple, trachea midline, no neck masses, no thyroid tenderness, no thyromegaly LYMPHATICS: no LAN in the neck, no supraclavicular LAN RESPIRATORY: breathing is even & unlabored, BS CTAB CARDIAC: RRR, no murmur,no extra heart sounds, no edema; right chest perm catheter GI: abdomen soft, normal BS, no masses, no tenderness, no hepatomegaly, no splenomegaly EXTREMITIES: right AKA stump covered with dressing, able to move LLE and BUE; Left AVF +bruit/thrill PSYCHIATRIC: the patient is alert & oriented to person, affect & behavior appropriate  LABS/RADIOLOGY: Labs reviewed: Basic Metabolic Panel:  Recent Labs  13/11/6504/06/16 2301  10/03/14 0235  10/08/14 0901  10/15/14 0137 10/16/14 0548 10/18/14 0305  NA 140  < > 136  < > 132*  < > 134* 134* 136  K 3.8  < > 3.7  < > 3.7  < > 3.6 3.9 3.9  CL 101  < > 96*  < >  96*  < > 96* 94* 97*  CO2 17*  < > 23  < > 21*  < > 23 27 26   GLUCOSE 162*  < > 171*  < > 136*  < > 137* 124* 137*  BUN 67*  < > 31*  < > 31*  < > 23* 36* 42*  CREATININE 4.65*  < > 3.68*  < > 3.66*  < > 3.62* 4.65* 4.24*  CALCIUM 8.2*  < > 8.2*  < > 8.3*  < > 8.7* 9.1 9.0  MG 2.2  --  2.0  --  1.8  --   --   --   --     PHOS  --   < >  --   < >  --   < > 3.6 4.1 3.4  < > = values in this interval not displayed. Liver Function Tests:  Recent Labs  10/05/14 0248  10/08/14 0901  10/15/14 0137 10/16/14 0548 10/18/14 0305  AST 7*  --  19  --   --  22  --   ALT 11*  --  13*  --   --  16*  --   ALKPHOS 107  --  95  --   --  104  --   BILITOT 2.3*  --  2.6*  --   --  1.6*  --   PROT 6.2*  --  5.7*  --   --  5.8*  --   ALBUMIN 2.3*  < > 2.1*  < > 2.2* 2.2*  2.2* 2.1*  < > = values in this interval not displayed.  Recent Labs  09/26/14 0100 10/04/14 0335  LIPASE 106* 119*    Recent Labs  09/30/14 0110  AMMONIA 28   CBC:  Recent Labs  09/28/14 0540 09/29/14 0013  10/08/14 0901  10/16/14 0548 10/17/14 1000 10/18/14 0305  WBC 12.5* 12.2*  < > 6.5  < > 9.3 10.7* 10.1  NEUTROABS 11.2* 11.2*  --  5.2  --   --   --   --   HGB 11.3* 10.8*  < > 10.1*  < > 7.5* 8.7* 8.0*  HCT 34.9* 33.5*  < > 30.7*  < > 23.5* 27.2* 25.1*  MCV 87.7 88.6  < > 85.3  < > 89.7 89.8 89.6  PLT 182 172  < > 128*  < > 134* 122* 124*  < > = values in this interval not displayed.  Lipid Panel:  Recent Labs  09/25/14 0410 09/26/14 0345  HDL <10* <10*   Cardiac Enzymes:  Recent Labs  09/30/14 0110 09/30/14 0510 09/30/14 1303  TROPONINI 0.07* 0.10* 0.11*   CBG:  Recent Labs  10/18/14 0739 10/18/14 1235 10/18/14 1639  GLUCAP 137* 153* 113*     US Renal  09/25/2014   CLINICAL DATA:  Acute on chronic renal failure.  EXAM: RENAL / URINARY TRACT ULTRASOUND COMPLETE  COMPARISON:  Abdominal ultrasound 01/14/2014  FINDINGS: Right Kidney:  Length: 9.4 cm. Thinning of the renal parenchyma with increased cortical echogenicity. No mass or hydronephrosis visualized.  Left Kidney:  Length: 9.2 cm. Thinning of the renal parenchyma and increased renal echogenicity. No mass or hydronephrosis visualized.  Bladder:  Appears normal for degree of bladder distention.  Incidental: There is a moderate volume of ascites in the  abdomen and pelvis.  IMPRESSION: 1. Small echogenic kidneys consistent chronic medical renal disease. No obstructive uropathy. 2. Moderate volume of intra-abdominal and pelvic ascites.   Electronically  Signed   By: Rubye Oaks M.D.   On: 09/25/2014 01:16   Dg Chest Port 1 View  10/17/2014   CLINICAL DATA:  Shortness of breath, chest pressure last night  EXAM: PORTABLE CHEST - 1 VIEW  COMPARISON:  10/01/2014  FINDINGS: Cardiomegaly again noted. Status post CABG. Dual lumen right IJ catheter is unchanged in position. No acute infiltrate or pulmonary edema. There is no pneumothorax.  IMPRESSION: No active disease. Cardiomegaly again noted. Status post CABG. Stable dual lumen right IJ catheter position. No pneumothorax.   Electronically Signed   By: Natasha Mead M.D.   On: 10/17/2014 09:09   Dg Chest Port 1 View  10/01/2014   CLINICAL DATA:  Status post catheter insertion  EXAM: PORTABLE CHEST - 1 VIEW  COMPARISON:  09/27/2014  FINDINGS: There is a dual-lumen right-sided central venous catheter with the tip in satisfactory position. There is mild bilateral interstitial thickening. There is no pleural effusion or pneumothorax. There is stable cardiomegaly. There is evidence of prior CABG.  The osseous structures are unremarkable.  IMPRESSION: Dual-lumen right-sided central venous catheter with the tips in satisfactory position. No pneumothorax.  Mild pulmonary edema.   Electronically Signed   By: Elige Ko   On: 10/01/2014 12:04   Dg Chest Portable 1 View  09/27/2014   CLINICAL DATA:  Central line placement  EXAM: PORTABLE CHEST - 1 VIEW  COMPARISON:  09/24/2014  FINDINGS: There is a central line from an inferior approach which terminates in the right atrium. Mild pulmonary edema pattern. No pneumothorax.  IMPRESSION: Central venous line from a femoral approach with tip in the right atrium.   Electronically Signed   By: Genevive Bi M.D.   On: 09/27/2014 19:30   Dg Chest Port 1 View  09/24/2014    CLINICAL DATA:  Weakness and lower extremity edema.  EXAM: PORTABLE CHEST - 1 VIEW  COMPARISON:  09/28/2013  FINDINGS: Stable mild cardiac enlargement and evidence of prior CABG. Lung volumes are low bilaterally. There is no evidence of pulmonary edema, consolidation, pneumothorax, nodule or pleural fluid.  IMPRESSION: Stable mild cardiomegaly.  Low lung volumes without acute findings.   Electronically Signed   By: Irish Lack M.D.   On: 09/24/2014 19:38   US Abdomen Limited Ruq  10/05/2014   CLINICAL DATA:  Jaundice  EXAM: US ABDOMEN LIMITED - RIGHT UPPER QUADRANT  COMPARISON:  None  FINDINGS: Gallbladder:  Well distended with echogenic material consistent with gallbladder sludge. No gallstones are seen. Diffuse ascites is seen contributing to mild wall thickening of 3.7 mm.  Common bile duct:  Diameter: 3.2 mm.  Liver:  No focal lesion identified. Within normal limits in parenchymal echogenicity.  IMPRESSION: Diffuse ascites.  Gallbladder sludge.   Electronically Signed   By: Alcide Clever M.D.   On: 10/05/2014 09:04    ASSESSMENT/PLAN:  Physical deconditioning  - for rehabilitation    ESRD - continue hemodialysis via right chest [facility] at [facility]; S/P Left AVF, brachial/cephalic 6/17; creatinine 4.24  PAF - had wide complex tachycardia in the hospital which required cardioversion on 6/6; currently sinus rhythm; continue amiodarone 400 mg 1 tab by mouth twice a day and Coumadin 2.5 mg by mouth daily; follow-up with cardiology, Dr. 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 Arbie Cookey, in 3 weeks;  continue OxyContin 20 mg 1 tab by mouth every 12 hours and oxycodone 10 mg 1 tab by mouth every 6 hours when necessary for pain  Jaundice - likely due to ESRD; (-) hepatitis, (-) HIV, nonreactive RPR  CAD S/P CABG in 2010  - stable; continue nitroglycerin 0.4 mg 1 tab sublingual every 5 minutes, maximum of 3 doses when necessary  Chronic systolic CHF - EF 20-25%; ICD was to be considered if he started dialysis, EP does not feel he is presently at candidate for aggressive procedures  Diabetes mellitus, type II - hemoglobin A1c 6.4; continue NovoLog sliding scale insulin subcutaneous 3 times a day before meals  C. difficile enteritis - continue vancomycin 125 mg/5 mL 4 times a day till 6/30 to complete 14 days course  Anemia in CKD - hemoglobin 8.0; continue on Aranesp 100 g/0.5 mL every Tuesday is on hemodialysis and ferrous sulfate 325 mg 1 tab by mouth daily  Hypotension - continue Midodrine 10 mg 1 tab by mouth every changes days, Thursdays and Saturdays with dialysis  GERD - continue Protonix 40 mg 1 tab by mouth daily  Constipation - continue MiraLAX 17 mg by mouth daily when necessary and senna S1 tab by mouth twice a day  Protein calorie malnutrition, severe - albumen 2.1; referred to RD; continue supplementation   Goals of care:  Short-term rehabilitation   Labs/test ordered:   CBC and BMP   Spent 50 minutes in patient care.    [facility], NP [employer] [phone]

## 2014-10-22 ENCOUNTER — Telehealth: Payer: Self-pay | Admitting: Cardiovascular Disease

## 2014-10-22 ENCOUNTER — Non-Acute Institutional Stay (SKILLED_NURSING_FACILITY): Payer: Medicare Other | Admitting: Internal Medicine

## 2014-10-22 DIAGNOSIS — A047 Enterocolitis due to Clostridium difficile: Secondary | ICD-10-CM

## 2014-10-22 DIAGNOSIS — L89153 Pressure ulcer of sacral region, stage 3: Secondary | ICD-10-CM | POA: Diagnosis not present

## 2014-10-22 DIAGNOSIS — N189 Chronic kidney disease, unspecified: Secondary | ICD-10-CM

## 2014-10-22 DIAGNOSIS — E46 Unspecified protein-calorie malnutrition: Secondary | ICD-10-CM | POA: Diagnosis not present

## 2014-10-22 DIAGNOSIS — I25119 Atherosclerotic heart disease of native coronary artery with unspecified angina pectoris: Secondary | ICD-10-CM | POA: Diagnosis not present

## 2014-10-22 DIAGNOSIS — I4891 Unspecified atrial fibrillation: Secondary | ICD-10-CM | POA: Diagnosis not present

## 2014-10-22 DIAGNOSIS — Z992 Dependence on renal dialysis: Secondary | ICD-10-CM

## 2014-10-22 DIAGNOSIS — N186 End stage renal disease: Secondary | ICD-10-CM

## 2014-10-22 DIAGNOSIS — I953 Hypotension of hemodialysis: Secondary | ICD-10-CM

## 2014-10-22 DIAGNOSIS — E119 Type 2 diabetes mellitus without complications: Secondary | ICD-10-CM

## 2014-10-22 DIAGNOSIS — K219 Gastro-esophageal reflux disease without esophagitis: Secondary | ICD-10-CM

## 2014-10-22 DIAGNOSIS — R5381 Other malaise: Secondary | ICD-10-CM | POA: Diagnosis not present

## 2014-10-22 DIAGNOSIS — I5042 Chronic combined systolic (congestive) and diastolic (congestive) heart failure: Secondary | ICD-10-CM

## 2014-10-22 DIAGNOSIS — D631 Anemia in chronic kidney disease: Secondary | ICD-10-CM

## 2014-10-22 DIAGNOSIS — I70261 Atherosclerosis of native arteries of extremities with gangrene, right leg: Secondary | ICD-10-CM | POA: Diagnosis not present

## 2014-10-22 DIAGNOSIS — L98429 Non-pressure chronic ulcer of back with unspecified severity: Secondary | ICD-10-CM

## 2014-10-22 DIAGNOSIS — A0472 Enterocolitis due to Clostridium difficile, not specified as recurrent: Secondary | ICD-10-CM | POA: Insufficient documentation

## 2014-10-22 NOTE — Progress Notes (Signed)
Patient ID: [name], male   DOB: Aug 05, 1965, 49 y.o.   MRN: [id number]     [facility]   PCP: [name], NP  Code Status: full code  No Known Allergies  Chief Complaint  Patient presents with  . New Admit To SNF     HPI:  49 year old patient is here for short term rehabilitation post hospital admission from 09/24/14-10/18/14 with sepsis and metabolic encephalopathy in setting of ESRD and bilateral lower leg cellulitis. He was started on vancomycin and zosyn and has completed his course of antibiotics. He was started on hemodialysis and also had AV fistula placed.  He had acute onset afib and was started on coumadin with heparin bridging and amiodarone. 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 He then had c.diff enteritis and was started on po vancomycin. He has PMH of HTN, HLD, DM, GERD, CAD s/p CABG 2010, CHF, CKD stage 4, severe PAD among others. He is seen in his room today. He appears uncomfortable and complaints of pain in his right leg. He feels weak and tired. He had dialysis yesterday.   Review of Systems:  Constitutional: Negative for fever, chills, diaphoresis.  HENT: Negative for headache, congestion, nasal discharge Eyes: Negative for eye pain, blurred vision, double vision and discharge.  Respiratory: Negative for cough, shortness of breath and wheezing.   Cardiovascular: Negative for chest pain, palpitations.  Gastrointestinal: Negative for heartburn, nausea, vomiting, abdominal pain, has semi formed stool. Appetite is good Genitourinary: Negative for dysuria and flank pain.  Musculoskeletal: Negative for back pain, falls Skin: Negative for itching, rash.  Neurological: Negative for dizziness, tingling, focal weakness Psychiatric/Behavioral: Negative for depression   Past Medical History  Diagnosis Date  . Coronary artery disease 07/05/2011    2D ECHO - EF ~40%, moderate  concentric LV hypertrophy, LA moderately dilated, mild-moderate septal and inferior wall hypokinesis  . Hypertension   . CHF (congestive heart failure)   . Diabetes mellitus     Insulin dependent  . Cardiomyopathy   . Chronic kidney disease (CKD), stage IV (severe)   . Claudication 05/09/2011    Right and left anterior tibial arteries and Left SFA-occluded; Right CIA-<50% diameter reduction; Right Deep Profunda-70-99% diameter reduction; Right SFA->60% diameter reduction; Right Distal Popliteal/Tibial Artery- >60% diameter reduction; Left CFA and Profunda- >50% diameter reduction  . 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 GERD (gastroesophageal reflux disease)    Past Surgical History  Procedure Laterality Date  . Cardiac catheterization  05/07/2008    CABG  . Coronary artery bypass graft  06/2008    x3  . Insertion of dialysis catheter Right 10/01/2014    Procedure: INSERTION OF RIGHT INTERNAL JUGULAR DIALYSIS CATHETER;  Surgeon: Larina Earthly, MD;  Location: [facility] OR;  Service: Vascular;  Laterality: Right;  . Amputation Right 10/06/2014    Procedure: AMPUTATION ABOVE KNEE Right;  Surgeon: Sherren Kerns, MD;  Location: [facility] OR;  Service: Vascular;  Laterality: Right;  . Av fistula placement Left 10/10/2014    Procedure: LEFT BRACHIAL TO CEPHALIC ARTERIOVENOUS (AV) FISTULA CREATION;  Surgeon: Larina Earthly, MD;  Location: [facility] OR;  Service: Vascular;  Laterality: Left;   Social History:   reports that he quit smoking about  13 months ago. His smoking use included Cigarettes. He has a 12.5 pack-year smoking history. He does not have any smokeless tobacco history on file. He reports that he drinks alcohol. He reports that he uses illicit drugs (Marijuana).  No family history on file.  Medications: Patient's  Medications  New Prescriptions   No medications on file  Previous Medications   AMINO ACIDS-PROTEIN HYDROLYS (FEEDING SUPPLEMENT, PRO-STAT SUGAR FREE 64,) LIQD    Take 30 mLs by mouth 2 (two) times daily.   AMIODARONE (PACERONE) 400 MG TABLET    Take 1 tablet (400 mg total) by mouth 2 (two) times daily.   CALCIUM ACETATE (PHOSLO) 667 MG CAPSULE    Take 2 capsules (1,334 mg total) by mouth 3 (three) times daily with meals.   COLLAGENASE (SANTYL) OINTMENT    Apply topically daily.   DARBEPOETIN ALFA (ARANESP) 100 MCG/0.5ML SOSY INJECTION    Inject 0.5 mLs (100 mcg total) into the vein every Tuesday with hemodialysis.   FERROUS SULFATE (IRON) 325 (65 FE) MG TABS    Take 325 mg by mouth daily.   FOLIC ACID (FOLVITE) 1 MG TABLET    Take 1 tablet (1 mg total) by mouth daily.   INSULIN ASPART (NOVOLOG) 100 UNIT/ML INJECTION    Inject 0-9 Units into the skin 3 (three) times daily with meals. Before each meal 3 times a day, 140-199 - 2 units, 200-250 - 4 units, 251-299 - 6 units,  300-349 - 8 units,  350 or above 10 units. Insulin PEN if approved, provide syringes and needles if needed.   MIDODRINE (PROAMATINE) 10 MG TABLET    Take 1 tablet (10 mg total) by mouth Every Tuesday,Thursday,and Saturday with dialysis.   MULTIVITAMIN (RENA-VIT) TABS TABLET    Take 1 tablet by mouth at bedtime.   NITROGLYCERIN (NITROSTAT) 0.4 MG SL TABLET    Place 1 tablet (0.4 mg total) under the tongue once.   NUTRITIONAL SUPPLEMENTS (FEEDING SUPPLEMENT, NEPRO CARB STEADY,) LIQD    Take 237 mLs by mouth 2 (two) times daily between meals.   OXYCODONE HCL 10 MG TABS    Take 1 tablet (10 mg total) by mouth every 6 (six) hours as needed. For pain   PANTOPRAZOLE (PROTONIX) 40 MG TABLET    Take 40 mg by mouth daily.    POLYETHYLENE GLYCOL (MIRALAX / GLYCOLAX) PACKET    Take 17 g by mouth daily as needed for moderate constipation.   SENNA-DOCUSATE (SENOKOT-S) 8.6-50 MG PER TABLET    Take 1 tablet by mouth 2 (two) times daily.    VANCOMYCIN (VANCOCIN HCL) 125 MG CAPSULE    Take 1 capsule (125 mg total) by mouth 4 (four) times daily.   WARFARIN (COUMADIN) 2.5 MG TABLET    Take 1 tablet (2.5 mg total) by mouth daily. First dose start from Sunday 6/25  Modified Medications   No medications on file  Discontinued Medications   No medications on file     Physical Exam: Filed Vitals:   10/22/14 0958  BP: 122/65  Pulse: 72  Temp: 99.1 F (37.3 C)  Resp: 18  SpO2: 98%    General- adult male, ill appearing, thin built, in no acute distress Head- normocephalic, atraumatic Nose- normal nasal mucosa, no maxillary or frontal sinus tenderness, no nasal discharge Throat- moist mucus membrane Eyes- PERRLA, EOMI, no pallor, no icterus, no discharge, normal conjunctiva, normal sclera Neck- no cervical lymphadenopathy, no jugular vein distension Cardiovascular- normal s1,s2, no murmurs, 1+ left leg  edema Respiratory- bilateral clear to auscultation, no wheeze, no rhonchi, no crackles, no use of accessory muscles Abdomen- bowel sounds present, soft, non tender Musculoskeletal- right AKA, able to move all other extremities, left arm AV fistula with good thrill, right chest has dialysis catheter, site clean Neurological- no focal deficit Skin- warm and dry, right arm skin tear with bleeding, right leg amputation site with surigcal incision healing well with mild erythema, swelling and staples in place, no drainage from incision line, skin tear on the thigh area with some bleeding noted, no signs of infection noted, has stage 3 pressure ulcer with some sloughing in sacral area, has venous stasis ulcer in right leg Psychiatry- alert and oriented, normal mood and affect    Labs reviewed: Basic Metabolic Panel:  Recent Labs  16/10/96 2301  10/03/14 0235  10/08/14 0901  10/15/14 0137 10/16/14 0548 10/18/14 0305  NA 140  < > 136  < > 132*  < > 134* 134* 136  K 3.8  < > 3.7  < > 3.7  < > 3.6 3.9 3.9  CL 101  < > 96*  < >  96*  < > 96* 94* 97*  CO2 17*  < > 23  < > 21*  < > 23 27 26   GLUCOSE 162*  < > 171*  < > 136*  < > 137* 124* 137*  BUN 67*  < > 31*  < > 31*  < > 23* 36* 42*  CREATININE 4.65*  < > 3.68*  < > 3.66*  < > 3.62* 4.65* 4.24*  CALCIUM 8.2*  < > 8.2*  < > 8.3*  < > 8.7* 9.1 9.0  MG 2.2  --  2.0  --  1.8  --   --   --   --   PHOS  --   < >  --   < >  --   < > 3.6 4.1 3.4  < > = values in this interval not displayed. Liver Function Tests:  Recent Labs  10/05/14 0248  10/08/14 0901  10/15/14 0137 10/16/14 0548 10/18/14 0305  AST 7*  --  19  --   --  22  --   ALT 11*  --  13*  --   --  16*  --   ALKPHOS 107  --  95  --   --  104  --   BILITOT 2.3*  --  2.6*  --   --  1.6*  --   PROT 6.2*  --  5.7*  --   --  5.8*  --   ALBUMIN 2.3*  < > 2.1*  < > 2.2* 2.2*  2.2* 2.1*  < > = values in this interval not displayed.  Recent Labs  09/26/14 0100 10/04/14 0335  LIPASE 106* 119*    Recent Labs  09/30/14 0110  AMMONIA 28   CBC:  Recent Labs  09/28/14 0540 09/29/14 0013  10/08/14 0901  10/16/14 0548 10/17/14 1000 10/18/14 0305  WBC 12.5* 12.2*  < > 6.5  < > 9.3 10.7* 10.1  NEUTROABS 11.2* 11.2*  --  5.2  --   --   --   --   HGB 11.3* 10.8*  < > 10.1*  < > 7.5* 8.7* 8.0*  HCT 34.9* 33.5*  < > 30.7*  < > 23.5* 27.2* 25.1*  MCV 87.7 88.6  < > 85.3  < > 89.7 89.8 89.6  PLT 182 172  < >  128*  < > 134* 122* 124*  < > = values in this interval not displayed. Cardiac Enzymes:  Recent Labs  09/30/14 0110 09/30/14 0510 09/30/14 1303  TROPONINI 0.07* 0.10* 0.11*   BNP: Invalid input(s): POCBNP CBG:  Recent Labs  10/18/14 0739 10/18/14 1235 10/18/14 1639  GLUCAP 137* 153* 113*    Radiological Exams:  US Renal  09/25/2014   CLINICAL DATA:  Acute on chronic renal failure.  EXAM: RENAL / URINARY TRACT ULTRASOUND COMPLETE  COMPARISON:  Abdominal ultrasound 01/14/2014  FINDINGS: Right Kidney:  Length: 9.4 cm. Thinning of the renal parenchyma with increased cortical  echogenicity. No mass or hydronephrosis visualized.  Left Kidney:  Length: 9.2 cm. Thinning of the renal parenchyma and increased renal echogenicity. No mass or hydronephrosis visualized.  Bladder:  Appears normal for degree of bladder distention.  Incidental: There is a moderate volume of ascites in the abdomen and pelvis.  IMPRESSION: 1. Small echogenic kidneys consistent chronic medical renal disease. No obstructive uropathy. 2. Moderate volume of intra-abdominal and pelvic ascites.   Electronically Signed   By: Rubye Oaks M.D.   On: 09/25/2014 01:16   Dg Chest Port 1 View  10/17/2014   CLINICAL DATA:  Shortness of breath, chest pressure last night  EXAM: PORTABLE CHEST - 1 VIEW  COMPARISON:  10/01/2014  FINDINGS: Cardiomegaly again noted. Status post CABG. Dual lumen right IJ catheter is unchanged in position. No acute infiltrate or pulmonary edema. There is no pneumothorax.  IMPRESSION: No active disease. Cardiomegaly again noted. Status post CABG. Stable dual lumen right IJ catheter position. No pneumothorax.   Electronically Signed   By: Natasha Mead M.D.   On: 10/17/2014 09:09   Dg Chest Port 1 View  10/01/2014   CLINICAL DATA:  Status post catheter insertion  EXAM: PORTABLE CHEST - 1 VIEW  COMPARISON:  09/27/2014  FINDINGS: There is a dual-lumen right-sided central venous catheter with the tip in satisfactory position. There is mild bilateral interstitial thickening. There is no pleural effusion or pneumothorax. There is stable cardiomegaly. There is evidence of prior CABG.  The osseous structures are unremarkable.  IMPRESSION: Dual-lumen right-sided central venous catheter with the tips in satisfactory position. No pneumothorax.  Mild pulmonary edema.   Electronically Signed   By: Elige Ko   On: 10/01/2014 12:04   Dg Chest Portable 1 View  09/27/2014   CLINICAL DATA:  Central line placement  EXAM: PORTABLE CHEST - 1 VIEW  COMPARISON:  09/24/2014  FINDINGS: There is a central line from an  inferior approach which terminates in the right atrium. Mild pulmonary edema pattern. No pneumothorax.  IMPRESSION: Central venous line from a femoral approach with tip in the right atrium.   Electronically Signed   By: Genevive Bi M.D.   On: 09/27/2014 19:30   Dg Chest Port 1 View  09/24/2014   CLINICAL DATA:  Weakness and lower extremity edema.  EXAM: PORTABLE CHEST - 1 VIEW  COMPARISON:  09/28/2013  FINDINGS: Stable mild cardiac enlargement and evidence of prior CABG. Lung volumes are low bilaterally. There is no evidence of pulmonary edema, consolidation, pneumothorax, nodule or pleural fluid.  IMPRESSION: Stable mild cardiomegaly.  Low lung volumes without acute findings.   Electronically Signed   By: Irish Lack M.D.   On: 09/24/2014 19:38   US Abdomen Limited Ruq  10/05/2014   CLINICAL DATA:  Jaundice  EXAM: US ABDOMEN LIMITED - RIGHT UPPER QUADRANT  COMPARISON:  None  FINDINGS: Gallbladder:  Well distended with echogenic material consistent with gallbladder sludge. No gallstones are seen. Diffuse ascites is seen contributing to mild wall thickening of 3.7 mm.  Common bile duct:  Diameter: 3.2 mm.  Liver:  No focal lesion identified. Within normal limits in parenchymal echogenicity.  IMPRESSION: Diffuse ascites.  Gallbladder sludge.   Electronically Signed   By: Alcide CleverMark  Lukens M.D.   On: 10/05/2014 09:04       Assessment/Plan  Physical deconditioning  Will have him work with physical therapy and occupational therapy team to help with muscle strengthening exercises, gait training and restoring his functions.fall precautions. Skin care. Encourage to be out of bed.   PAD with gangrene Right leg gangrene, s/p right AKA. Continue wound care with dressing. Has follow up with vascular team. Currently on oxycontin 20 mg bid with oxycodone 10 mg q6h prn pain. Increase breakthrough pain medication to 10 mg q4h prn pain and reassess  afib Rate currently controlled. Continue amiodarone 400 mg  bid for now. Last inr 10/20/14 5.5. Coumadin currently on hold. inr check today. Goal inr 2-3. S/p cardioversion in hospital. Has follow up with cardiology  ESRD continue [facility] 3 days a week  C.diff enteritis Continue and complete po vancomycin on 10/23/14.   Hypotension from HD Continue midodrine 10 mg on dialysis days and monitor bp  Stage 3 pressure ulcer In sacral area, continue skin care and pressure ulcer prophylaxis. Monitor po intake.  Protein calorie malnutrition Continue renal diet with nepro supplement, monitor clinically. Continue skin care, pressure ulcer prophylaxis, monitor weight. Continue renavite and folic acid supplement  Anemia of chronic disease Hb 8. Continue aransep with HD and ferrous sulfate 325 mg daily, monitor h&h  CAD  S/P CABG in 2010. Continue prn NTG   Chronic combined CHF  EF 20-25%, not a candidate for ICD. Weight monitoring  Diabetes mellitus, type II A1c 6.4. continue NovoLog SSI with meals and monitor cbg  GERD Stable, continue Protonix 40 mg daily  Constipation Continue senna s and prn miralax   Goals of care: short term rehabilitation   Labs/tests ordered: cbc, cmp  Family/ staff Communication: reviewed care plan with patient and nursing supervisor    Oneal GroutMAHIMA Phung Kotas, MD  [facility] [phone] (Monday-Friday 8 am - 5 pm) [phone] (afterhours)

## 2014-10-23 ENCOUNTER — Other Ambulatory Visit: Payer: Self-pay | Admitting: *Deleted

## 2014-10-23 MED ORDER — OXYCODONE HCL 10 MG PO TABS: ORAL_TABLET | ORAL | Status: DC

## 2014-10-23 NOTE — Telephone Encounter (Signed)
Closed encounter °

## 2014-10-23 NOTE — Telephone Encounter (Signed)
[facility] 

## 2014-10-24 ENCOUNTER — Other Ambulatory Visit: Payer: Self-pay

## 2014-10-24 MED ORDER — OXYCODONE HCL 10 MG PO TABS: ORAL_TABLET | ORAL | Status: DC

## 2014-10-24 NOTE — Telephone Encounter (Signed)
[facility] 

## 2014-10-27 ENCOUNTER — Other Ambulatory Visit
Admission: RE | Admit: 2014-10-27 | Discharge: 2014-10-27 | Disposition: A | Discharge status: Home / Self Care | Payer: Medicare Other | Source: Other Acute Inpatient Hospital | Attending: Internal Medicine | Admitting: Internal Medicine

## 2014-10-27 DIAGNOSIS — N186 End stage renal disease: Secondary | ICD-10-CM | POA: Insufficient documentation

## 2014-10-27 LAB — BASIC METABOLIC PANEL
Anion gap: 14 (ref 5–15)
BUN: 53 mg/dL — AB (ref 6–20)
CALCIUM: 8.7 mg/dL — AB (ref 8.9–10.3)
CHLORIDE: 94 mmol/L — AB (ref 101–111)
CO2: 26 mmol/L (ref 22–32)
Creatinine, Ser: 4.06 mg/dL — ABNORMAL HIGH (ref 0.61–1.24)
GFR calc Af Amer: 18 mL/min — ABNORMAL LOW (ref >60)
GFR calc non Af Amer: 16 mL/min — ABNORMAL LOW (ref >60)
Glucose, Bld: 94 mg/dL (ref 65–99)
Potassium: 4.3 mmol/L (ref 3.5–5.1)
Sodium: 134 mmol/L — ABNORMAL LOW (ref 135–145)

## 2014-11-04 ENCOUNTER — Encounter: Payer: Self-pay | Admitting: Vascular Surgery

## 2014-11-06 ENCOUNTER — Ambulatory Visit (INDEPENDENT_AMBULATORY_CARE_PROVIDER_SITE_OTHER): Payer: Self-pay | Admitting: Vascular Surgery

## 2014-11-06 ENCOUNTER — Encounter: Payer: Self-pay | Admitting: Vascular Surgery

## 2014-11-06 ENCOUNTER — Encounter: Payer: Medicare Other | Admitting: Vascular Surgery

## 2014-11-06 VITALS — BP 126/53 | HR 82 | Temp 98.3°F | Resp 18 | Ht 71.0 in | Wt 160.0 lb

## 2014-11-06 DIAGNOSIS — I739 Peripheral vascular disease, unspecified: Secondary | ICD-10-CM

## 2014-11-06 DIAGNOSIS — N186 End stage renal disease: Secondary | ICD-10-CM

## 2014-11-06 DIAGNOSIS — Z992 Dependence on renal dialysis: Secondary | ICD-10-CM

## 2014-11-06 NOTE — Progress Notes (Signed)
Shows a 49 year old male who underwent a right above-knee amputation approximately one month ago. He also had a left brachiocephalic AV fistula placed by my partner Dr. Arbie CookeyEarly at the same hospital admission. Some drainage from the right above-knee amputation and skin breakdown. He is currently on Tobi Bastosnna biotics for this. He is on vancomycin. He denies any fever or chills. He denies any numbness or tingling in his left hand.  Physical exam:  Filed Vitals:   11/06/14 0826  BP: 126/53  Pulse: 82  Temp: 98.3 F (36.8 C)  TempSrc: Oral  Resp: 18  Height: 5\' 11"  (1.803 m)  Weight: 160 lb (72.576 kg)  SpO2: 99%    Right lower extremity: 2+ femoral pulse separation of the above-knee amputation wound in the medial and central aspect each wound open approximate 2 cm. There is some surrounding erythema. There is some serous drainage.  Left upper extremity: Well-healed antecubital incision with what feels like either small hematoma or seroma at the antecubital level. There is an easily palpable thrill in the fistula.  Assessment: Slowly healing right above-knee amputation currently on vancomycin. Staples were removed today and local wound care with normal saline wet to dry dressing started. Patient will return in a few weeks for further follow-up. His fistula seems to be maturing so far.  Plan: See above  Fabienne Brunsharles Tyreona Panjwani, MD [facility] Office: [phone] Pager: [phone]

## 2014-11-17 ENCOUNTER — Encounter (HOSPITAL_COMMUNITY): Payer: Self-pay | Admitting: *Deleted

## 2014-11-17 ENCOUNTER — Encounter: Payer: Self-pay | Admitting: Vascular Surgery

## 2014-11-17 ENCOUNTER — Emergency Department (HOSPITAL_COMMUNITY)
Admission: EM | Admit: 2014-11-17 | Discharge: 2014-11-17 | Disposition: A | Discharge status: Home / Self Care | Payer: Medicare Other | Attending: Emergency Medicine | Admitting: Emergency Medicine

## 2014-11-17 DIAGNOSIS — Z992 Dependence on renal dialysis: Secondary | ICD-10-CM | POA: Diagnosis not present

## 2014-11-17 DIAGNOSIS — Z792 Long term (current) use of antibiotics: Secondary | ICD-10-CM | POA: Diagnosis not present

## 2014-11-17 DIAGNOSIS — N186 End stage renal disease: Secondary | ICD-10-CM | POA: Diagnosis not present

## 2014-11-17 DIAGNOSIS — Z87891 Personal history of nicotine dependence: Secondary | ICD-10-CM | POA: Diagnosis not present

## 2014-11-17 DIAGNOSIS — Z7901 Long term (current) use of anticoagulants: Secondary | ICD-10-CM | POA: Insufficient documentation

## 2014-11-17 DIAGNOSIS — E119 Type 2 diabetes mellitus without complications: Secondary | ICD-10-CM | POA: Diagnosis not present

## 2014-11-17 DIAGNOSIS — Z4801 Encounter for change or removal of surgical wound dressing: Secondary | ICD-10-CM | POA: Diagnosis present

## 2014-11-17 DIAGNOSIS — Z9889 Other specified postprocedural states: Secondary | ICD-10-CM | POA: Diagnosis not present

## 2014-11-17 DIAGNOSIS — I2510 Atherosclerotic heart disease of native coronary artery without angina pectoris: Secondary | ICD-10-CM | POA: Diagnosis not present

## 2014-11-17 DIAGNOSIS — T8131XA Disruption of external operation (surgical) wound, not elsewhere classified, initial encounter: Secondary | ICD-10-CM | POA: Diagnosis not present

## 2014-11-17 DIAGNOSIS — Z951 Presence of aortocoronary bypass graft: Secondary | ICD-10-CM | POA: Insufficient documentation

## 2014-11-17 DIAGNOSIS — I120 Hypertensive chronic kidney disease with stage 5 chronic kidney disease or end stage renal disease: Secondary | ICD-10-CM | POA: Insufficient documentation

## 2014-11-17 DIAGNOSIS — K219 Gastro-esophageal reflux disease without esophagitis: Secondary | ICD-10-CM | POA: Insufficient documentation

## 2014-11-17 DIAGNOSIS — Z79899 Other long term (current) drug therapy: Secondary | ICD-10-CM | POA: Diagnosis not present

## 2014-11-17 DIAGNOSIS — Z794 Long term (current) use of insulin: Secondary | ICD-10-CM | POA: Diagnosis not present

## 2014-11-17 DIAGNOSIS — R140 Abdominal distension (gaseous): Secondary | ICD-10-CM | POA: Insufficient documentation

## 2014-11-17 DIAGNOSIS — I509 Heart failure, unspecified: Secondary | ICD-10-CM | POA: Diagnosis not present

## 2014-11-17 DIAGNOSIS — Y838 Other surgical procedures as the cause of abnormal reaction of the patient, or of later complication, without mention of misadventure at the time of the procedure: Secondary | ICD-10-CM | POA: Diagnosis not present

## 2014-11-17 MED ORDER — LIDOCAINE-EPINEPHRINE (PF) 2 %-1:200000 IJ SOLN
10.0000 mL | Freq: Once | INTRAMUSCULAR | Status: DC
  Filled 2014-11-17: qty 20

## 2014-11-17 NOTE — ED Notes (Signed)
Pt arrived by [facility] from [facility]. Had fistula left arm inserted weeks ago, incision has now opened and small amount of bleeding/drainage.

## 2014-11-17 NOTE — Discharge Instructions (Signed)
Wound Dehiscence °Wound dehiscence is when a surgical cut (incision) breaks open and does not heal properly after surgery. It usually happens 7-10 days after surgery. This can be a serious condition. It is important to identify and treat this condition early.  °CAUSES  °Some common causes of wound dehiscence include: °· Stretching of the wound area. This may be caused by lifting, vomiting, violent coughing, or straining during bowel movements. °· Wound infection. °· Early stitch (suture) removal. °RISK FACTORS °Various things can increase your risk of developing wound dehiscence, including: °· Obesity. °· Lung disease. °· Smoking. °· Poor nutrition. °· Contamination during surgery. °SIGNS AND SYMPTOMS °· Bleeding from the wound. °· Pain. °· Fever. °· Wound starts breaking open. °DIAGNOSIS  °· Your health care provider may diagnose wound dehiscence by monitoring the incision and noting any changes in the wound. These changes can include an increase in drainage or pain. The health care provider may also ask you if you have noticed any stretching or tearing of the wound. °· Wound cultures may be taken to determine if there is an infection.  °· Imaging studies, such as an MRI scan or CT scan, may be done to determine if there is a collection of pus or fluid in the wound area. °TREATMENT °Treatment may include: °· Wound care. °· Surgical repair. °· Antibiotic medicine to treat or prevent infection. °· Medicines to reduce pain and swelling. °HOME CARE INSTRUCTIONS  °· Only take over-the-counter or prescription medicines for pain, discomfort, or fever as directed by your health care provider. Taking pain medicine 30 minutes before changing a bandage (dressing) can help relieve pain. °· Take your antibiotics as directed. Finish them even if you start to feel better. °· Gently wash the area with mild soap and water 2 times a day, or as directed. Rinse off the soap. Pat the area dry with a clean towel. Do not rub the wound.  This may cause bleeding. °· Follow your health care provider's instructions for how often you need to change the dressing and packing inside. Wash your hands well before and after changing your dressing. Apply a dressing to the wound as directed. °· Take showers. Do not soak the wound, bathe, swim, or use a hot tub until directed by your health care provider. °· Avoid exercises that make you sweat heavily. °· Use anti-itch medicine as directed by your health care provider. The wound may itch when it is healing. Do not pick or scratch at the wound. °· Do not lift more than 10 pounds (4.5 kg) until the wound is healed, or as directed by your health care provider. °· Keep all follow-up appointments as directed. °SEEK MEDICAL CARE IF: °· You have excessive bleeding from your surgical wound. °· Your wound does not seem to be healing properly. °· You have a fever. °SEEK IMMEDIATE MEDICAL CARE IF:  °· You have increased swelling or redness around the wound. °· You have increasing pain in the wound. °· You have an increasing amount of pus coming from the wound. °· Your wound breaks open farther. °MAKE SURE YOU:  °· Understand these instructions. °· Will watch your condition. °· Will get help right away if you are not doing well or get worse. °Document Released: 07/02/2003 Document Revised: 04/16/2013 Document Reviewed: 12/17/2012 °[employer]® Patient Information ©2015 [employer]. This information is not intended to replace advice given to you by your health care provider. Make sure you discuss any questions you have with your health care provider. ° °

## 2014-11-17 NOTE — Consult Note (Signed)
[facility] NOTE   MRN : [id number]  Reason for Consult: left upper arm AV fistula incisional dehisance Referring Physician: ED  History of Present Illness: 49 y/o male discharged from the hospital discharged 11/17/2014 s/p left BC fistula creation 10/10/2014 by Dr. Arbie Cookey and s/p right AKA by Dr. Darrick Penna 10/06/2014.  He is currently on dialysis using a right IJ diatek catheter since 10/01/2014 placed by Dr. Arbie Cookey.   He was last seen in our office by Dr. Darrick Penna on 11/06/2014 the antecubital incision was noted to be well healed at that visit.  He states the incision opened last night while transferring.  He is currently on Vancomycin PO 125 mg QID, coumadin A fib, hypertension, hyperlipidemia, diabetes mellitus, GERD, CAD(s/P of CABG 2010), combined systolic and diastolic congestive heart failure (EF 20-25% with grade 3 diastolic dysfunction), chronic kidney disease-stage IV, severe PAD, substance abuse.       No current facility-administered medications for this encounter.   Current Outpatient Prescriptions  Medication Sig Dispense Refill  . Amino Acids-Protein Hydrolys (FEEDING SUPPLEMENT, PRO-STAT SUGAR FREE 64,) LIQD Take 30 mLs by mouth 2 (two) times daily. 900 mL 0  . amiodarone (PACERONE) 400 MG tablet Take 1 tablet (400 mg total) by mouth 2 (two) times daily. 60 tablet 0  . calcium acetate (PHOSLO) 667 MG capsule Take 2 capsules (1,334 mg total) by mouth 3 (three) times daily with meals. 90 capsule 0  . collagenase (SANTYL) ointment Apply topically daily. 15 g 0  . Darbepoetin Alfa (ARANESP) 100 MCG/0.5ML SOSY injection Inject 0.5 mLs (100 mcg total) into the vein every Tuesday with hemodialysis. 4.2 mL 0  . Ferrous Sulfate (IRON) 325 (65 FE) MG TABS Take 325 mg by mouth daily.    . folic acid (FOLVITE) 1 MG tablet Take 1 tablet (1 mg total) by mouth daily. 30 tablet 0  . insulin aspart (NOVOLOG) 100 UNIT/ML injection Inject 0-9 Units into the skin 3  (three) times daily with meals. Before each meal 3 times a day, 140-199 - 2 units, 200-250 - 4 units, 251-299 - 6 units,  300-349 - 8 units,  350 or above 10 units. Insulin PEN if approved, provide syringes and needles if needed. 10 mL 11  . midodrine (PROAMATINE) 10 MG tablet Take 1 tablet (10 mg total) by mouth Every Tuesday,Thursday,and Saturday with dialysis. 30 tablet 0  . multivitamin (RENA-VIT) TABS tablet Take 1 tablet by mouth at bedtime. 30 tablet 0  . nitroGLYCERIN (NITROSTAT) 0.4 MG SL tablet Place 1 tablet (0.4 mg total) under the tongue once. (Patient taking differently: Place 0.4 mg under the tongue every 5 (five) minutes as needed for chest pain. ) 25 tablet 5  . Nutritional Supplements (FEEDING SUPPLEMENT, NEPRO CARB STEADY,) LIQD Take 237 mLs by mouth 2 (two) times daily between meals. 30 Can 0  . Oxycodone HCl 10 MG TABS Take one tablet by mouth twice daily (morning and bedtime); take one tablet by mouth every 4 hours as needed for breakthrough pain 240 tablet 0  . pantoprazole (PROTONIX) 40 MG tablet Take 40 mg by mouth daily.     . polyethylene glycol (MIRALAX / GLYCOLAX) packet Take 17 g by mouth daily as needed for moderate constipation. 14 each 0  . senna-docusate (SENOKOT-S) 8.6-50 MG per tablet Take 1 tablet by mouth 2 (two) times daily. 30 tablet 0  . vancomycin (VANCOCIN HCL) 125 MG capsule Take 1 capsule (125 mg total) by mouth 4 (four)  times daily. 28 capsule 0  . warfarin (COUMADIN) 2.5 MG tablet Take 1 tablet (2.5 mg total) by mouth daily. First dose start from Sunday 6/25 30 tablet 0    Pt meds include: Statin :No Betablocker: No ASA: No Other anticoagulants/antiplatelets: coumadin  Past Medical History  Diagnosis Date  . Coronary artery disease 07/05/2011    2D ECHO - EF ~40%, moderate concentric LV hypertrophy, LA moderately dilated, mild-moderate septal and inferior wall hypokinesis  . Hypertension   . CHF (congestive heart failure)   . Diabetes mellitus      Insulin dependent  . Cardiomyopathy   . Chronic kidney disease (CKD), stage IV (severe)   . Claudication 05/09/2011    Right and left anterior tibial arteries and Left SFA-occluded; Right CIA-<50% diameter reduction; Right Deep Profunda-70-99% diameter reduction; Right SFA->60% diameter reduction; Right Distal Popliteal/Tibial Artery- >60% diameter reduction; Left CFA and Profunda- >50% diameter reduction  . 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 GERD (gastroesophageal reflux disease)     Past Surgical History  Procedure Laterality Date  . Cardiac catheterization  05/07/2008    CABG  . Coronary artery bypass graft  06/2008    x3  . Insertion of dialysis catheter Right 10/01/2014    Procedure: INSERTION OF RIGHT INTERNAL JUGULAR DIALYSIS CATHETER;  Surgeon: Larina Earthly, MD;  Location: [facility] OR;  Service: Vascular;  Laterality: Right;  . Amputation Right 10/06/2014    Procedure: AMPUTATION ABOVE KNEE Right;  Surgeon: Sherren Kerns, MD;  Location: [facility] OR;  Service: Vascular;  Laterality: Right;  . Av fistula placement Left 10/10/2014    Procedure: LEFT BRACHIAL TO CEPHALIC ARTERIOVENOUS (AV) FISTULA CREATION;  Surgeon: Larina Earthly, MD;  Location: [facility] OR;  Service: Vascular;  Laterality: Left;    Social History History  Substance Use Topics  . Smoking status: Former Smoker -- 0.50 packs/day for 25 years    Types: Cigarettes    Quit date: 09/03/2013  . Smokeless tobacco: Not on file  . Alcohol Use: Yes     Comment: occas.    Family History History reviewed. No pertinent family history. no change since last visit  No Known Allergies   REVIEW OF SYSTEMS  General: [ ]  Weight loss, [ ]  Fever, [ ]  chills Neurologic: [ ]  Dizziness, [ ]  Blackouts, [ ]  Seizure [ ]  Stroke, [ ]  "Mini stroke", [ ]  Slurred  speech, [ ]  Temporary blindness; [ ]  weakness in arms or legs, [ ]  Hoarseness [ ]  Dysphagia Cardiac: [ ]  Chest pain/pressure, [ ]  Shortness of breath at rest [ ]  Shortness of breath with exertion, [ ]  Atrial fibrillation or irregular heartbeat  Vascular: [ ]  Pain in legs with walking, [ ]  Pain in legs at rest, [ ]  Pain in legs at night,  [x ] Non-healing ulcer, [ ]  Blood clot in vein/DVT,   Pulmonary: [ ]  Home oxygen, [ ]  Productive cough, [ ]  Coughing up blood, [ ]  Asthma,  [ ]  Wheezing [ ]  COPD Musculoskeletal:  [ ]  Arthritis, [ ]  Low back pain, [ ]  Joint pain Hematologic: [ ]  Easy Bruising, [x ] Anemia; [ ]  Hepatitis Gastrointestinal: [ ]  Blood in stool, [ ]  Gastroesophageal Reflux/heartburn, Urinary: [x ] chronic Kidney disease, [ ]  on HD - [ ]  MWF or [x ] TTHS, [ ]   Burning with urination,  Difficulty urinating Skin:  Rashes, [x ] Wounds Psychological:  Anxiety,  Depression  Physical Examination Filed Vitals:   11/17/14 1221  BP: 142/70  Pulse: 78  Temp: 98.2 F (36.8 C)  TempSrc: Oral  Resp: 18  SpO2: 100%   There is no weight on file to calculate BMI.  General:  Malnourished  in NAD  HENT: WNL Eyes: Pupils equal Pulmonary: normal non-labored breathing , without Rales, rhonchi,  wheezing Cardiac: irregularly irregular, without  Murmurs, rubs or gallops; No carotid bruits Abdomen: soft, NT, no masses Skin: no rashes, ulcers noted;  no Gangrene , no cellulitis; positive left antecubital incision serous drainage no erythema or edema open wounds;   Vascular Exam/Pulses:palpable left radial pulse and palpable RC fistula thrill.   Musculoskeletal: positive muscle wasting and atrophy; no edema  Neurologic: A&O X 3; Appropriate Affect ;  SENSATION: normal; MOTOR FUNCTION: 5/5 Symmetric Speech is fluent/normal   Significant Diagnostic Studies: CBC Lab Results  Component Value Date   WBC 10.1 10/18/2014   HGB 8.0* 10/18/2014   HCT 25.1* 10/18/2014   MCV  89.6 10/18/2014   PLT 124* 10/18/2014    BMET    Component Value Date/Time   NA 134* 10/27/2014 0712   K 4.3 10/27/2014 0712   CL 94* 10/27/2014 0712   CO2 26 10/27/2014 0712   GLUCOSE 94 10/27/2014 0712   BUN 53* 10/27/2014 0712   CREATININE 4.06* 10/27/2014 0712   CALCIUM 8.7* 10/27/2014 0712   GFRNONAA 16* 10/27/2014 0712   GFRAA 18* 10/27/2014 0712   CrCl cannot be calculated (Patient has no serum creatinine result on file.).  COAG Lab Results  Component Value Date   INR 3.43* 10/18/2014   INR 3.10* 10/17/2014   INR 3.06* 10/16/2014     Non-Invasive Vascular Imaging: NONE  ASSESSMENT/PLAN:  Left AV fistula incisional dehiscence serious drainage. Procedure: Irrigation with serial saline Lidocaine 5 cc at skin edges 2 nylon simple stiches place to close kin edges Dry dressing applied Disposition stable F/U with Dr. Odis Luster in 10 days    Clinton Gallant [facility] 11/17/2014 1:54 PM

## 2014-11-17 NOTE — ED Notes (Signed)
PTAR at bedside. Dc papers reviewed and sent with PTAR. All pt belongings sent with PTAR.

## 2014-11-17 NOTE — ED Notes (Signed)
Vascular at bedside

## 2014-11-17 NOTE — ED Provider Notes (Signed)
CSN: [id number]     Arrival date & time 11/17/14  1213 History   First MD Initiated Contact with Patient 11/17/14 1214     Chief Complaint  Patient presents with  . Wound Check   (Consider location/radiation/quality/duration/timing/severity/associated sxs/prior Treatment) Patient is a 49 y.o. male presenting with general illness. The history is provided by the patient. No language interpreter was used.  Illness Location:  Left arm wound Quality:  AVF skin dehiscense  Severity:  Mild Onset quality:  Sudden Timing:  Constant Progression:  Unchanged Chronicity:  New Context:  Recent surgery Relieved by:  Nothing tried Worsened by:  Nothing tried Ineffective treatments:  Nothing tried Associated symptoms: no abdominal pain, no chest pain, no cough, no diarrhea, no fatigue, no fever, no headaches, no myalgias, no nausea, no shortness of breath, no vomiting and no wheezing     Past Medical History  Diagnosis Date  . Coronary artery disease 07/05/2011    2D ECHO - EF ~40%, moderate concentric LV hypertrophy, LA moderately dilated, mild-moderate septal and inferior wall hypokinesis  . Hypertension   . CHF (congestive heart failure)   . Diabetes mellitus     Insulin dependent  . Cardiomyopathy   . Chronic kidney disease (CKD), stage IV (severe)   . Claudication 05/09/2011    Right and left anterior tibial arteries and Left SFA-occluded; Right CIA-<50% diameter reduction; Right Deep Profunda-70-99% diameter reduction; Right SFA->60% diameter reduction; Right Distal Popliteal/Tibial Artery- >60% diameter reduction; Left CFA and Profunda- >50% diameter reduction  . 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 GERD (gastroesophageal reflux disease)    Past Surgical History  Procedure Laterality Date  . Cardiac  catheterization  05/07/2008    CABG  . Coronary artery bypass graft  06/2008    x3  . Insertion of dialysis catheter Right 10/01/2014    Procedure: INSERTION OF RIGHT INTERNAL JUGULAR DIALYSIS CATHETER;  Surgeon: Larina Earthly, MD;  Location: [facility] OR;  Service: Vascular;  Laterality: Right;  . Amputation Right 10/06/2014    Procedure: AMPUTATION ABOVE KNEE Right;  Surgeon: Sherren Kerns, MD;  Location: [facility] OR;  Service: Vascular;  Laterality: Right;  . Av fistula placement Left 10/10/2014    Procedure: LEFT BRACHIAL TO CEPHALIC ARTERIOVENOUS (AV) FISTULA CREATION;  Surgeon: Larina Earthly, MD;  Location: [facility] OR;  Service: Vascular;  Laterality: Left;   History reviewed. No pertinent family history. History  Substance Use Topics  . Smoking status: Former Smoker -- 0.50 packs/day for 25 years    Types: Cigarettes    Quit date: 09/03/2013  . Smokeless tobacco: Not on file  . Alcohol Use: Yes     Comment: occas.    Review of Systems  Constitutional: Negative for fever and fatigue.  Respiratory: Negative for cough, chest tightness, shortness of breath and wheezing.   Cardiovascular: Negative for chest pain.  Gastrointestinal: Positive for abdominal distention (chronic). Negative for nausea, vomiting, abdominal pain and diarrhea.  Musculoskeletal: Positive for gait problem (2/2 right AKA). Negative for myalgias.  Skin: Positive for wound (left AVF placement).  Neurological: Negative for light-headedness and headaches.  Psychiatric/Behavioral: Negative for confusion.  All other systems reviewed and are negative.     Allergies  Review of patient's allergies indicates no known allergies.  Home Medications   Prior to Admission medications  Medication Sig Start Date End Date Taking? Authorizing Provider  Amino Acids-Protein Hydrolys (FEEDING SUPPLEMENT, PRO-STAT SUGAR FREE 64,) LIQD Take 30 mLs by mouth 2 (two) times daily. 10/17/14   Albertine Grates, MD  amiodarone (PACERONE) 400 MG tablet Take 1  tablet (400 mg total) by mouth 2 (two) times daily. 10/17/14   Albertine Grates, MD  calcium acetate (PHOSLO) 667 MG capsule Take 2 capsules (1,334 mg total) by mouth 3 (three) times daily with meals. 10/17/14   Albertine Grates, MD  collagenase (SANTYL) ointment Apply topically daily. 10/17/14   Albertine Grates, MD  Darbepoetin Alfa (ARANESP) 100 MCG/0.5ML SOSY injection Inject 0.5 mLs (100 mcg total) into the vein every Tuesday with hemodialysis. 10/17/14   Albertine Grates, MD  Ferrous Sulfate (IRON) 325 (65 FE) MG TABS Take 325 mg by mouth daily.    Historical Provider, MD  folic acid (FOLVITE) 1 MG tablet Take 1 tablet (1 mg total) by mouth daily. 10/17/14   Albertine Grates, MD  insulin aspart (NOVOLOG) 100 UNIT/ML injection Inject 0-9 Units into the skin 3 (three) times daily with meals. Before each meal 3 times a day, 140-199 - 2 units, 200-250 - 4 units, 251-299 - 6 units,  300-349 - 8 units,  350 or above 10 units. Insulin PEN if approved, provide syringes and needles if needed. 10/18/14   Albertine Grates, MD  midodrine (PROAMATINE) 10 MG tablet Take 1 tablet (10 mg total) by mouth Every Tuesday,Thursday,and Saturday with dialysis. 10/18/14   Albertine Grates, MD  multivitamin (RENA-VIT) TABS tablet Take 1 tablet by mouth at bedtime. 10/17/14   Albertine Grates, MD  nitroGLYCERIN (NITROSTAT) 0.4 MG SL tablet Place 1 tablet (0.4 mg total) under the tongue once. Patient taking differently: Place 0.4 mg under the tongue every 5 (five) minutes as needed for chest pain.  10/01/13   Mihai Croitoru, MD  Nutritional Supplements (FEEDING SUPPLEMENT, NEPRO CARB STEADY,) LIQD Take 237 mLs by mouth 2 (two) times daily between meals. 10/17/14   Albertine Grates, MD  Oxycodone HCl 10 MG TABS Take one tablet by mouth twice daily (morning and bedtime); take one tablet by mouth every 4 hours as needed for breakthrough pain 10/24/14   Kirt Boys, DO  pantoprazole (PROTONIX) 40 MG tablet Take 40 mg by mouth daily.  05/08/13   Historical Provider, MD  polyethylene glycol (MIRALAX / GLYCOLAX) packet  Take 17 g by mouth daily as needed for moderate constipation. 10/17/14   Albertine Grates, MD  senna-docusate (SENOKOT-S) 8.6-50 MG per tablet Take 1 tablet by mouth 2 (two) times daily. 10/17/14   Albertine Grates, MD  vancomycin (VANCOCIN HCL) 125 MG capsule Take 1 capsule (125 mg total) by mouth 4 (four) times daily. 10/17/14   Albertine Grates, MD  warfarin (COUMADIN) 2.5 MG tablet Take 1 tablet (2.5 mg total) by mouth daily. First dose start from Sunday 6/25 10/19/14   Albertine Grates, MD   BP 142/70 mmHg  Pulse 78  Temp(Src) 98.2 F (36.8 C) (Oral)  Resp 18  SpO2 100%   Physical Exam  Constitutional: He is oriented to person, place, and time. He appears well-developed and well-nourished. No distress.  HENT:  Head: Normocephalic and atraumatic.  Nose: Nose normal.  Mouth/Throat: Oropharynx is clear and moist. No oropharyngeal exudate.  Eyes: EOM are normal. Pupils are equal, round, and reactive to light.  Neck: Normal range of motion. Neck supple.  Cardiovascular: Normal rate, regular rhythm, normal heart sounds and intact distal pulses.   No murmur  heard. Pulmonary/Chest: Effort normal and breath sounds normal. No respiratory distress. He has no wheezes. He exhibits no tenderness.  Abdominal: Soft. He exhibits no distension. There is no tenderness. There is no guarding.  Musculoskeletal: Normal range of motion. He exhibits no tenderness.  Right AKA.  Distal stump with 2 chronic ulcers at the skin fold/incision line.  Draining serous fluid but no surrounding erythema, nontender, no warmth.    Neurological: He is alert and oriented to person, place, and time. No cranial nerve deficit. Coordination normal.  Skin: Skin is warm and dry. He is not diaphoretic. No pallor.  Left AC with brachiocephalic AVF with + thrill.  Inferior skin incision with 1 cm of wound dehiscence with serous drainage.  No surrounding erythema, warmth, or tenderness.   Psychiatric: He has a normal mood and affect. His behavior is normal. Judgment and  thought content normal.  Nursing note and vitals reviewed.   ED Course  Procedures (including critical care time) Labs Review Labs Reviewed - No data to display  Imaging Review No results found.   EKG Interpretation None      MDM   Final diagnoses:  Wound dehiscence, initial encounter   Pt is a 49 yo M with a hx of HTN, CHF, DM, CKD4, CAD with CABG, recent right AKA, and who is s/p left brachiocephalic AVF placed by Dr. Arbie Cookey on 10/10/14 who presents with left arm pain and concern for fistula problem.  He was pulling with his arms attempting to get in an erect position yesterday with physical therapy, when he felt a "pop" and immediate pain at his left [facility].  Had a moderate amount of clear fluid "squirt my chest" then has continued to drain throughout the night/today.  Denies significant pain.  No fever, purulent drainage, or myalgias.    Epic review shows he was seen in clinic by Dr. Darrick Penna on 7/14 with exam that reported concern for a hematoma or seroma at the left [facility].    Today has ~1 cm of wound dehiscence at left [facility] with clear serous fluid draining.  No surrounding erythema, warmth, or tenderness.  Palpable thrill at AVF still.    Paged Dr. Darrick Penna, who is on call for vascular surgery.  To evaluate in the ED.   Vascular surgery placed 2 x nylon sutures in the wound at ED bedside.  Pt to follow up with their clinic in one week.  Patient was informed and voiced understanding.  All questions were answered and ED return precautions were discussed.  Discharged back to rehab facility via [place] transport.    Patient was seen with ED Attending, Dr. Zonia Kief, MD   Lenell Antu, MD 11/17/14 1610  Rolland Porter, MD 11/22/14 [phone]

## 2014-11-17 NOTE — ED Provider Notes (Signed)
Pt seen and evaluated.  D/W Dr. Delford Field.  Pt with dehiss of wd with activity today.  Axam without bleeding.  + thrill.  Pt seen by vascular and wound sutured.  Rolland Porter, MD 11/17/14 [phone]

## 2014-11-18 ENCOUNTER — Non-Acute Institutional Stay (SKILLED_NURSING_FACILITY): Payer: Medicare Other | Admitting: Adult Health

## 2014-11-18 ENCOUNTER — Encounter: Payer: Self-pay | Admitting: Adult Health

## 2014-11-18 DIAGNOSIS — I953 Hypotension of hemodialysis: Secondary | ICD-10-CM | POA: Diagnosis not present

## 2014-11-18 DIAGNOSIS — I5042 Chronic combined systolic (congestive) and diastolic (congestive) heart failure: Secondary | ICD-10-CM | POA: Diagnosis not present

## 2014-11-18 DIAGNOSIS — G4700 Insomnia, unspecified: Secondary | ICD-10-CM

## 2014-11-18 DIAGNOSIS — N189 Chronic kidney disease, unspecified: Secondary | ICD-10-CM | POA: Diagnosis not present

## 2014-11-18 DIAGNOSIS — R5381 Other malaise: Secondary | ICD-10-CM | POA: Diagnosis not present

## 2014-11-18 DIAGNOSIS — K219 Gastro-esophageal reflux disease without esophagitis: Secondary | ICD-10-CM

## 2014-11-18 DIAGNOSIS — I25119 Atherosclerotic heart disease of native coronary artery with unspecified angina pectoris: Secondary | ICD-10-CM

## 2014-11-18 DIAGNOSIS — E119 Type 2 diabetes mellitus without complications: Secondary | ICD-10-CM

## 2014-11-18 DIAGNOSIS — L89153 Pressure ulcer of sacral region, stage 3: Secondary | ICD-10-CM

## 2014-11-18 DIAGNOSIS — Z7901 Long term (current) use of anticoagulants: Secondary | ICD-10-CM

## 2014-11-18 DIAGNOSIS — I480 Paroxysmal atrial fibrillation: Secondary | ICD-10-CM

## 2014-11-18 DIAGNOSIS — N186 End stage renal disease: Secondary | ICD-10-CM | POA: Diagnosis not present

## 2014-11-18 DIAGNOSIS — L98429 Non-pressure chronic ulcer of back with unspecified severity: Secondary | ICD-10-CM

## 2014-11-18 DIAGNOSIS — K5900 Constipation, unspecified: Secondary | ICD-10-CM

## 2014-11-18 DIAGNOSIS — I739 Peripheral vascular disease, unspecified: Secondary | ICD-10-CM | POA: Diagnosis not present

## 2014-11-18 DIAGNOSIS — Z992 Dependence on renal dialysis: Secondary | ICD-10-CM

## 2014-11-18 DIAGNOSIS — D631 Anemia in chronic kidney disease: Secondary | ICD-10-CM

## 2014-11-18 DIAGNOSIS — E43 Unspecified severe protein-calorie malnutrition: Secondary | ICD-10-CM

## 2014-11-19 NOTE — Progress Notes (Signed)
Patient ID: [name], male   DOB: 1966/02/22, 49 y.o.   MRN: [id number]   11/18/14  Facility:  Nursing Home Location:  [facility] Room Number: [id number] LEVEL OF CARE:  SNF (31)   Chief Complaint  Patient presents with  . Medical Management of Chronic Issues    Physical deconditioning, ESRD, PAF, PAD, jaundice, CAD S/P CABG, CHF, diabetes mellitus, C. difficile enteritis, anemia, hypotension, GERD, constipation and protein calorie malnutrition    HISTORY OF PRESENT ILLNESS:  This is a 49 year old male who is being seen for a routine visit. He was recently sent to ED due to left ac wound dehiscence. He had left BC fistula creation on 6/17. Wound had 2 stitches.  He is currently having hemodialysis using his right IJ diatek.  He is currently taking Coumadin for PAF. Latest INR  3.2, supra-therapeutic. No bleeding noted.  He has been admitted to [facility] on 10/18/14 from [facility]. He has PMH of chronic systolic CHF, with EF 20-25%, hypertension, hyperlipidemia, diabetes mellitus, GERD, CAD S/P CABG in 2010, ESRD, severe PAD and substance abuse. He presented to the ED with right leg swelling and pain for several days. His leg has become red from foot to upper thigh. He was not able to ambulate 2 days on the right leg. He then had generalized weakness, decreased urinary output and constipation. 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   PAST MEDICAL HISTORY:  Past Medical History  Diagnosis Date  . Coronary artery disease 07/05/2011    2D ECHO - EF ~40%, moderate concentric LV hypertrophy, LA moderately dilated, mild-moderate septal and inferior wall hypokinesis  . Hypertension   . CHF (congestive heart failure)   . Diabetes mellitus     Insulin dependent  . Cardiomyopathy   . Chronic kidney disease (CKD), stage IV (severe)   . Claudication 05/09/2011    Right and left anterior tibial arteries and Left SFA-occluded; Right CIA-<50% diameter reduction;  Right Deep Profunda-70-99% diameter reduction; Right SFA->60% diameter reduction; Right Distal Popliteal/Tibial Artery- >60% diameter reduction; Left CFA and Profunda- >50% diameter reduction  . 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 GERD (gastroesophageal reflux disease)     CURRENT MEDICATIONS: Reviewed per MAR/see medication list  No Known Allergies   REVIEW OF SYSTEMS:  GENERAL: no change in appetite, no fatigue, no weight changes, no fever, chills or weakness RESPIRATORY: no cough, SOB, DOE, wheezing, hemoptysis CARDIAC: no chest pain, edema or palpitations GI: no abdominal pain, diarrhea, constipation, heart burn, nausea or vomiting  PHYSICAL EXAMINATION  GENERAL: no acute distress, normal body habitus SKIN:  Sacral presure ulcer stage 3; left shin wound covered with silvamed, right AKA with dressing NECK: supple, trachea midline, no neck masses, no thyroid tenderness, no thyromegaly LYMPHATICS: no LAN in the neck, no supraclavicular LAN RESPIRATORY: breathing is even & unlabored, BS CTAB CARDIAC: RRR, no murmur,no extra heart sounds, no edema; right chest perm catheter GI: abdomen soft, normal BS, no masses, no tenderness, no hepatomegaly, no splenomegaly EXTREMITIES: right AKA stump covered with dressing, able to move LLE and BUE; Left fistula +bruit/thrill  PSYCHIATRIC: the patient is alert & oriented to person, affect & behavior appropriate  LABS/RADIOLOGY: Labs reviewed: 10/30/14  WBC 8.2 hemoglobin 8.7 hematocrit 26.8 MCV 89.6 platelet count 157  10/22/14  WBC 8.0 hemoglobin 8.4 hematocrit 24.7 MCV 85.8 platelet count 141 sodium 136 potassium 5.1 glucose 114 EUS and 37 creatinine 3.56 calcium 7.9 Basic Metabolic Panel:  Recent Labs  16/10/96 2301  10/03/14 0235  10/08/14 0901   10/15/14 0137 10/16/14 0548 10/18/14 0305 10/27/14 0712  NA 140  < > 136  < > 132*  < > 134* 134* 136 134*  K 3.8  < > 3.7  < > 3.7  < > 3.6 3.9 3.9 4.3  CL 101  < > 96*  < > 96*  < > 96* 94* 97* 94*  CO2 17*  < > 23  < > 21*  < > 23 27 26 26   GLUCOSE 162*  < > 171*  < > 136*  < > 137* 124* 137* 94  BUN 67*  < > 31*  < > 31*  < > 23* 36* 42* 53*  CREATININE 4.65*  < > 3.68*  < > 3.66*  < > 3.62* 4.65* 4.24* 4.06*  CALCIUM 8.2*  < > 8.2*  < > 8.3*  < > 8.7* 9.1 9.0 8.7*  MG 2.2  --  2.0  --  1.8  --   --   --   --   --   PHOS  --   < >  --   < >  --   < > 3.6 4.1 3.4  --   < > = values in this interval not displayed. Liver Function Tests:  Recent Labs  10/05/14 0248  10/08/14 0901  10/15/14 0137 10/16/14 0548 10/18/14 0305  AST 7*  --  19  --   --  22  --   ALT 11*  --  13*  --   --  16*  --   ALKPHOS 107  --  95  --   --  104  --   BILITOT 2.3*  --  2.6*  --   --  1.6*  --   PROT 6.2*  --  5.7*  --   --  5.8*  --   ALBUMIN 2.3*  < > 2.1*  < > 2.2* 2.2*  2.2* 2.1*  < > = values in this interval not displayed.  Recent Labs  09/26/14 0100 10/04/14 0335  LIPASE 106* 119*    Recent Labs  09/30/14 0110  AMMONIA 28   CBC:  Recent Labs  09/28/14 0540 09/29/14 0013  10/08/14 0901  10/16/14 0548 10/17/14 1000 10/18/14 0305  WBC 12.5* 12.2*  < > 6.5  < > 9.3 10.7* 10.1  NEUTROABS 11.2* 11.2*  --  5.2  --   --   --   --   HGB 11.3* 10.8*  < > 10.1*  < > 7.5* 8.7* 8.0*  HCT 34.9* 33.5*  < > 30.7*  < > 23.5* 27.2* 25.1*  MCV 87.7 88.6  < > 85.3  < > 89.7 89.8 89.6  PLT 182 172  < > 128*  < > 134* 122* 124*  < > = values in this interval not displayed.  Lipid Panel:  Recent Labs  09/25/14 0410 09/26/14 0345  HDL <10* <10*   Cardiac Enzymes:  Recent Labs  09/30/14 0110 09/30/14 0510 09/30/14 1303  TROPONINI 0.07* 0.10* 0.11*   CBG:  Recent Labs  10/18/14 0739 10/18/14 1235 10/18/14 1639  GLUCAP 137* 153* 113*     ASSESSMENT/PLAN:  Physical  deconditioning  - continue rehabilitation    ESRD -  continue hemodialysis via right chest PermCath at [facility] form kidney Center TTS; S/P Left AVF, brachial/cephalic 6/17; creatinine 4.06; continue PhosLo 667 mg 2 capsules = 1334 mg by mouth 3 times a day with meals  PAF - continue amiodarone 400 mg 1 tab by mouth twice a day and Coumadin   Long-term use of anticoagulation - INR3.5 , supratherapeutic; start Coumadin 1 mg by mouth every Monday-Wednesday-Friday-Saturday, Coumadin 0.5 mg by mouth every Sunday-Tuesday-Thursday; INR on 11/21/14  PAD RLE ischemia/pain 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 Arbie Cookey; continue OxyContin 20 mg 1 tab by mouth every 12 hours and oxycodone 10 mg 1 tab by mouth every 6 hours when necessary for pain and Oxycodone 10 mg  Tab PO Q 12 hours  CAD S/P CABG in 2010 - stable; continue nitroglycerin 0.4 mg 1 tab sublingual every 5 minutes, maximum of 3 doses when necessary  Chronic combined CHF - EF 20-25%; ICD was to be considered if he started dialysis, EP does not feel he is presently at candidate for aggressive procedures  Diabetes mellitus, type II - hemoglobin A1c 6.4; continue NovoLog sliding scale insulin subcutaneous 3 times a day before meals  Anemia in CKD - hemoglobin 8.7; continue on Aranesp 100 g/0.5 mL every Tuesday is on hemodialysis and ferrous sulfate 325 mg 1 tab by mouth daily  Hypotension - continue Midodrine 10 mg 1 tab by mouth every changes days, Thursdays and Saturdays with dialysis  GERD - continue Protonix 40 mg 1 tab by mouth daily  Constipation - continue MiraLAX 17 mg by mouth daily when necessary and senna S1 tab by mouth twice a day  Protein calorie malnutrition, severe - albumen 2.1; continue supplementation  Insomnia - continue Melatonin 3 mg 1 tab PO Q HS  Sacral pressure ulcer, stage 3 - continue treatment with Santyl   Goals of care:  Short-term rehabilitation     [facility], NP [facility] [phone]

## 2014-11-20 ENCOUNTER — Ambulatory Visit: Payer: Medicare Other | Admitting: Vascular Surgery

## 2014-11-21 ENCOUNTER — Other Ambulatory Visit: Payer: Self-pay | Admitting: *Deleted

## 2014-11-21 MED ORDER — OXYCODONE HCL 10 MG PO TABS: ORAL_TABLET | ORAL | Status: DC

## 2014-11-21 NOTE — Telephone Encounter (Signed)
[facility] 

## 2014-11-25 ENCOUNTER — Encounter: Payer: Self-pay | Admitting: Vascular Surgery

## 2014-11-25 ENCOUNTER — Telehealth: Payer: Self-pay

## 2014-11-25 ENCOUNTER — Ambulatory Visit (INDEPENDENT_AMBULATORY_CARE_PROVIDER_SITE_OTHER): Payer: Self-pay | Admitting: Vascular Surgery

## 2014-11-25 ENCOUNTER — Inpatient Hospital Stay (HOSPITAL_COMMUNITY)
Admission: AD | Admit: 2014-11-25 | Discharge: 2014-11-28 | DRG: 474 | Disposition: A | Discharge status: Skilled Nursing Facility | Payer: Medicare Other | Source: Ambulatory Visit | Attending: Vascular Surgery | Admitting: Vascular Surgery

## 2014-11-25 VITALS — BP 122/54 | HR 68 | Temp 98.3°F | Resp 12 | Ht 71.0 in | Wt 156.5 lb

## 2014-11-25 DIAGNOSIS — Z7901 Long term (current) use of anticoagulants: Secondary | ICD-10-CM

## 2014-11-25 DIAGNOSIS — Z794 Long term (current) use of insulin: Secondary | ICD-10-CM | POA: Diagnosis not present

## 2014-11-25 DIAGNOSIS — I4891 Unspecified atrial fibrillation: Secondary | ICD-10-CM | POA: Diagnosis present

## 2014-11-25 DIAGNOSIS — I120 Hypertensive chronic kidney disease with stage 5 chronic kidney disease or end stage renal disease: Secondary | ICD-10-CM | POA: Diagnosis present

## 2014-11-25 DIAGNOSIS — Z951 Presence of aortocoronary bypass graft: Secondary | ICD-10-CM

## 2014-11-25 DIAGNOSIS — N186 End stage renal disease: Secondary | ICD-10-CM

## 2014-11-25 DIAGNOSIS — M79662 Pain in left lower leg: Secondary | ICD-10-CM | POA: Diagnosis not present

## 2014-11-25 DIAGNOSIS — D62 Acute posthemorrhagic anemia: Secondary | ICD-10-CM | POA: Diagnosis not present

## 2014-11-25 DIAGNOSIS — Y835 Amputation of limb(s) as the cause of abnormal reaction of the patient, or of later complication, without mention of misadventure at the time of the procedure: Secondary | ICD-10-CM | POA: Diagnosis present

## 2014-11-25 DIAGNOSIS — Z89611 Acquired absence of right leg above knee: Secondary | ICD-10-CM

## 2014-11-25 DIAGNOSIS — T8789 Other complications of amputation stump: Secondary | ICD-10-CM | POA: Diagnosis present

## 2014-11-25 DIAGNOSIS — I2510 Atherosclerotic heart disease of native coronary artery without angina pectoris: Secondary | ICD-10-CM | POA: Diagnosis present

## 2014-11-25 DIAGNOSIS — E43 Unspecified severe protein-calorie malnutrition: Secondary | ICD-10-CM | POA: Diagnosis present

## 2014-11-25 DIAGNOSIS — Z6822 Body mass index (BMI) 22.0-22.9, adult: Secondary | ICD-10-CM

## 2014-11-25 DIAGNOSIS — M7989 Other specified soft tissue disorders: Secondary | ICD-10-CM | POA: Diagnosis not present

## 2014-11-25 DIAGNOSIS — T8781 Dehiscence of amputation stump: Principal | ICD-10-CM | POA: Diagnosis present

## 2014-11-25 DIAGNOSIS — T8189XA Other complications of procedures, not elsewhere classified, initial encounter: Secondary | ICD-10-CM | POA: Diagnosis not present

## 2014-11-25 DIAGNOSIS — D638 Anemia in other chronic diseases classified elsewhere: Secondary | ICD-10-CM | POA: Diagnosis present

## 2014-11-25 DIAGNOSIS — I739 Peripheral vascular disease, unspecified: Secondary | ICD-10-CM | POA: Diagnosis present

## 2014-11-25 DIAGNOSIS — K219 Gastro-esophageal reflux disease without esophagitis: Secondary | ICD-10-CM | POA: Diagnosis present

## 2014-11-25 DIAGNOSIS — E1151 Type 2 diabetes mellitus with diabetic peripheral angiopathy without gangrene: Secondary | ICD-10-CM | POA: Diagnosis present

## 2014-11-25 HISTORY — DX: Peripheral vascular disease, unspecified: I73.9

## 2014-11-25 HISTORY — DX: Acquired absence of unspecified leg above knee: Z89.619

## 2014-11-25 LAB — RENAL FUNCTION PANEL
ALBUMIN: 2.3 g/dL — AB (ref 3.5–5.0)
ALBUMIN: 2.2 g/dL — AB (ref 3.5–5.0)
ANION GAP: 18 — AB (ref 5–15)
Anion gap: 16 — ABNORMAL HIGH (ref 5–15)
BUN: 67 mg/dL — AB (ref 6–20)
BUN: 67 mg/dL — ABNORMAL HIGH (ref 6–20)
CALCIUM: 8.3 mg/dL — AB (ref 8.9–10.3)
CHLORIDE: 95 mmol/L — AB (ref 101–111)
CO2: 22 mmol/L (ref 22–32)
CO2: 22 mmol/L (ref 22–32)
CREATININE: 6.76 mg/dL — AB (ref 0.61–1.24)
Calcium: 8.4 mg/dL — ABNORMAL LOW (ref 8.9–10.3)
Chloride: 93 mmol/L — ABNORMAL LOW (ref 101–111)
Creatinine, Ser: 6.69 mg/dL — ABNORMAL HIGH (ref 0.61–1.24)
GFR calc Af Amer: 10 mL/min — ABNORMAL LOW (ref >60)
GFR, EST AFRICAN AMERICAN: 10 mL/min — AB (ref >60)
GFR, EST NON AFRICAN AMERICAN: 9 mL/min — AB (ref >60)
GFR, EST NON AFRICAN AMERICAN: 9 mL/min — AB (ref >60)
Glucose, Bld: 120 mg/dL — ABNORMAL HIGH (ref 65–99)
Glucose, Bld: 114 mg/dL — ABNORMAL HIGH (ref 65–99)
POTASSIUM: 4.8 mmol/L (ref 3.5–5.1)
Phosphorus: 10 mg/dL — ABNORMAL HIGH (ref 2.5–4.6)
Phosphorus: 10.1 mg/dL — ABNORMAL HIGH (ref 2.5–4.6)
Potassium: 4.8 mmol/L (ref 3.5–5.1)
Sodium: 133 mmol/L — ABNORMAL LOW (ref 135–145)
Sodium: 133 mmol/L — ABNORMAL LOW (ref 135–145)

## 2014-11-25 LAB — CBC
HEMATOCRIT: 25.6 % — AB (ref 39.0–52.0)
HEMATOCRIT: 25.3 % — AB (ref 39.0–52.0)
Hemoglobin: 8.2 g/dL — ABNORMAL LOW (ref 13.0–17.0)
Hemoglobin: 8 g/dL — ABNORMAL LOW (ref 13.0–17.0)
MCH: 29.6 pg (ref 26.0–34.0)
MCH: 29.2 pg (ref 26.0–34.0)
MCHC: 32 g/dL (ref 30.0–36.0)
MCHC: 31.6 g/dL (ref 30.0–36.0)
MCV: 92.4 fL (ref 78.0–100.0)
MCV: 92.3 fL (ref 78.0–100.0)
PLATELETS: 100 10*3/uL — AB (ref 150–400)
Platelets: 105 10*3/uL — ABNORMAL LOW (ref 150–400)
RBC: 2.77 MIL/uL — ABNORMAL LOW (ref 4.22–5.81)
RBC: 2.74 MIL/uL — AB (ref 4.22–5.81)
RDW: 18.7 % — ABNORMAL HIGH (ref 11.5–15.5)
RDW: 18.9 % — ABNORMAL HIGH (ref 11.5–15.5)
WBC: 8.4 10*3/uL (ref 4.0–10.5)
WBC: 8.6 10*3/uL (ref 4.0–10.5)

## 2014-11-25 LAB — PROTIME-INR
INR: 2.93 — ABNORMAL HIGH (ref 0.00–1.49)
Prothrombin Time: 30.1 seconds — ABNORMAL HIGH (ref 11.6–15.2)

## 2014-11-25 MED ORDER — VANCOMYCIN HCL IN DEXTROSE 750-5 MG/150ML-% IV SOLN
750.0000 mg | Freq: Once | INTRAVENOUS | Status: DC
  Filled 2014-11-25: qty 150

## 2014-11-25 MED ORDER — PENTAFLUOROPROP-TETRAFLUOROETH EX AERO: 1.0000 "application " | INHALATION_SPRAY | CUTANEOUS | Status: DC | PRN

## 2014-11-25 MED ORDER — AMIODARONE HCL 200 MG PO TABS
400.0000 mg | ORAL_TABLET | Freq: Two times a day (BID) | ORAL | Status: DC
  Administered 2014-11-26 – 2014-11-28 (×5): 400 mg via ORAL
  Filled 2014-11-25 (×7): qty 2

## 2014-11-25 MED ORDER — CALCIUM ACETATE (PHOS BINDER) 667 MG PO CAPS
1334.0000 mg | ORAL_CAPSULE | Freq: Three times a day (TID) | ORAL | Status: DC
  Administered 2014-11-26 – 2014-11-28 (×5): 1334 mg via ORAL
  Filled 2014-11-25 (×11): qty 2

## 2014-11-25 MED ORDER — LABETALOL HCL 5 MG/ML IV SOLN
10.0000 mg | INTRAVENOUS | Status: DC | PRN
  Filled 2014-11-25: qty 4

## 2014-11-25 MED ORDER — MORPHINE SULFATE 2 MG/ML IJ SOLN
2.0000 mg | INTRAMUSCULAR | Status: DC | PRN
  Administered 2014-11-25: 2 mg via INTRAVENOUS

## 2014-11-25 MED ORDER — VANCOMYCIN HCL 10 G IV SOLR
1750.0000 mg | Freq: Once | INTRAVENOUS | Status: AC
  Administered 2014-11-25: 1750 mg via INTRAVENOUS
  Filled 2014-11-25 (×2): qty 1750

## 2014-11-25 MED ORDER — SODIUM CHLORIDE 0.9 % IV SOLN: 100.0000 mL | INTRAVENOUS | Status: DC | PRN

## 2014-11-25 MED ORDER — FERROUS SULFATE 325 (65 FE) MG PO TABS
325.0000 mg | ORAL_TABLET | Freq: Every day | ORAL | Status: DC
  Administered 2014-11-26 – 2014-11-28 (×3): 325 mg via ORAL
  Filled 2014-11-25 (×3): qty 1

## 2014-11-25 MED ORDER — PRO-STAT SUGAR FREE PO LIQD
30.0000 mL | Freq: Two times a day (BID) | ORAL | Status: DC
  Filled 2014-11-25 (×7): qty 30

## 2014-11-25 MED ORDER — GUAIFENESIN-DM 100-10 MG/5ML PO SYRP: 15.0000 mL | ORAL_SOLUTION | ORAL | Status: DC | PRN

## 2014-11-25 MED ORDER — SODIUM CHLORIDE 0.9 % IJ SOLN: 3.0000 mL | INTRAMUSCULAR | Status: DC | PRN

## 2014-11-25 MED ORDER — METOPROLOL TARTRATE 1 MG/ML IV SOLN: 2.0000 mg | INTRAVENOUS | Status: DC | PRN

## 2014-11-25 MED ORDER — PIPERACILLIN-TAZOBACTAM IN DEX 2-0.25 GM/50ML IV SOLN
2.2500 g | Freq: Three times a day (TID) | INTRAVENOUS | Status: DC
  Administered 2014-11-26 – 2014-11-27 (×4): 2.25 g via INTRAVENOUS
  Filled 2014-11-25 (×11): qty 50

## 2014-11-25 MED ORDER — SODIUM CHLORIDE 0.9 % IV SOLN
62.5000 mg | INTRAVENOUS | Status: DC
  Administered 2014-11-27: 62.5 mg via INTRAVENOUS
  Filled 2014-11-25 (×2): qty 5

## 2014-11-25 MED ORDER — ACETAMINOPHEN 325 MG PO TABS
325.0000 mg | ORAL_TABLET | ORAL | Status: DC | PRN
Start: 2014-11-25 — End: 2014-11-26

## 2014-11-25 MED ORDER — OXYCODONE HCL 5 MG PO TABS
ORAL_TABLET | ORAL | Status: AC
  Filled 2014-11-25: qty 1

## 2014-11-25 MED ORDER — MORPHINE SULFATE 2 MG/ML IJ SOLN
INTRAMUSCULAR | Status: AC
  Filled 2014-11-25: qty 1

## 2014-11-25 MED ORDER — LIDOCAINE HCL (PF) 1 % IJ SOLN: 5.0000 mL | INTRAMUSCULAR | Status: DC | PRN

## 2014-11-25 MED ORDER — SENNOSIDES-DOCUSATE SODIUM 8.6-50 MG PO TABS
1.0000 | ORAL_TABLET | Freq: Two times a day (BID) | ORAL | Status: DC
  Administered 2014-11-26 – 2014-11-28 (×4): 1 via ORAL
  Filled 2014-11-25 (×8): qty 1

## 2014-11-25 MED ORDER — DARBEPOETIN ALFA 150 MCG/0.3ML IJ SOSY
PREFILLED_SYRINGE | INTRAMUSCULAR | Status: AC
  Filled 2014-11-25: qty 0.3

## 2014-11-25 MED ORDER — ONDANSETRON HCL 4 MG/2ML IJ SOLN: 4.0000 mg | Freq: Four times a day (QID) | INTRAMUSCULAR | Status: DC | PRN

## 2014-11-25 MED ORDER — NEPRO/CARBSTEADY PO LIQD
237.0000 mL | ORAL | Status: DC | PRN
Start: 2014-11-25 — End: 2014-11-25
  Filled 2014-11-25: qty 237

## 2014-11-25 MED ORDER — SODIUM CHLORIDE 0.9 % IV SOLN
250.0000 mL | INTRAVENOUS | Status: DC | PRN
  Administered 2014-11-26: 11:00:00 via INTRAVENOUS

## 2014-11-25 MED ORDER — DARBEPOETIN ALFA 150 MCG/0.3ML IJ SOSY
120.0000 ug | PREFILLED_SYRINGE | INTRAMUSCULAR | Status: DC
  Administered 2014-11-25: 120 ug via INTRAVENOUS

## 2014-11-25 MED ORDER — PANTOPRAZOLE SODIUM 40 MG PO TBEC
40.0000 mg | DELAYED_RELEASE_TABLET | Freq: Every day | ORAL | Status: DC
  Administered 2014-11-26 – 2014-11-28 (×3): 40 mg via ORAL
  Filled 2014-11-25: qty 1

## 2014-11-25 MED ORDER — WARFARIN - PHYSICIAN DOSING INPATIENT: Freq: Every day | Status: DC

## 2014-11-25 MED ORDER — FOLIC ACID 1 MG PO TABS
1.0000 mg | ORAL_TABLET | Freq: Every day | ORAL | Status: DC
  Administered 2014-11-26 – 2014-11-28 (×3): 1 mg via ORAL
  Filled 2014-11-25 (×3): qty 1

## 2014-11-25 MED ORDER — ACETAMINOPHEN 650 MG RE SUPP: 325.0000 mg | RECTAL | Status: DC | PRN

## 2014-11-25 MED ORDER — OXYCODONE HCL 5 MG PO TABS
5.0000 mg | ORAL_TABLET | ORAL | Status: DC | PRN
  Administered 2014-11-25 – 2014-11-27 (×6): 5 mg via ORAL
  Filled 2014-11-25 (×5): qty 1

## 2014-11-25 MED ORDER — NEPRO/CARBSTEADY PO LIQD
237.0000 mL | Freq: Two times a day (BID) | ORAL | Status: DC
  Administered 2014-11-27 – 2014-11-28 (×3): 237 mL via ORAL
  Filled 2014-11-25 (×11): qty 237

## 2014-11-25 MED ORDER — PHENOL 1.4 % MT LIQD
1.0000 | OROMUCOSAL | Status: DC | PRN
  Filled 2014-11-25: qty 177

## 2014-11-25 MED ORDER — SODIUM CHLORIDE 0.9 % IJ SOLN: 3.0000 mL | Freq: Two times a day (BID) | INTRAMUSCULAR | Status: DC

## 2014-11-25 MED ORDER — MIDODRINE HCL 5 MG PO TABS
10.0000 mg | ORAL_TABLET | ORAL | Status: DC
Start: 2014-11-27 — End: 2014-11-28
  Administered 2014-11-27: 10 mg via ORAL
  Filled 2014-11-25: qty 2

## 2014-11-25 MED ORDER — INSULIN ASPART 100 UNIT/ML ~~LOC~~ SOLN: 0.0000 [IU] | Freq: Three times a day (TID) | SUBCUTANEOUS | Status: DC

## 2014-11-25 MED ORDER — POLYETHYLENE GLYCOL 3350 17 G PO PACK
17.0000 g | PACK | Freq: Every day | ORAL | Status: DC | PRN
  Filled 2014-11-25: qty 1

## 2014-11-25 MED ORDER — WARFARIN SODIUM 2 MG PO TABS
2.0000 mg | ORAL_TABLET | Freq: Every day | ORAL | Status: DC
  Filled 2014-11-25: qty 1

## 2014-11-25 MED ORDER — HYDRALAZINE HCL 20 MG/ML IJ SOLN: 5.0000 mg | INTRAMUSCULAR | Status: DC | PRN

## 2014-11-25 MED ORDER — ALTEPLASE 2 MG IJ SOLR
2.0000 mg | Freq: Once | INTRAMUSCULAR | Status: DC | PRN
  Filled 2014-11-25: qty 2

## 2014-11-25 MED ORDER — LIDOCAINE-PRILOCAINE 2.5-2.5 % EX CREA
1.0000 "application " | TOPICAL_CREAM | CUTANEOUS | Status: DC | PRN
  Filled 2014-11-25: qty 5

## 2014-11-25 MED ORDER — VANCOMYCIN HCL 125 MG PO CAPS: 125.0000 mg | ORAL_CAPSULE | Freq: Four times a day (QID) | ORAL | Status: DC

## 2014-11-25 MED ORDER — HEPARIN SODIUM (PORCINE) 1000 UNIT/ML DIALYSIS
1000.0000 [IU] | INTRAMUSCULAR | Status: DC | PRN
Start: 2014-11-25 — End: 2014-11-25
  Filled 2014-11-25: qty 1

## 2014-11-25 MED ORDER — NITROGLYCERIN 0.4 MG SL SUBL: 0.4000 mg | SUBLINGUAL_TABLET | Freq: Once | SUBLINGUAL | Status: DC

## 2014-11-25 MED ORDER — RENA-VITE PO TABS
1.0000 | ORAL_TABLET | Freq: Every day | ORAL | Status: DC
  Administered 2014-11-26 – 2014-11-27 (×2): 1 via ORAL
  Filled 2014-11-25 (×4): qty 1

## 2014-11-25 NOTE — Progress Notes (Signed)
ANTIBIOTIC CONSULT NOTE - INITIAL  Pharmacy Consult for Vancomycin and Zosyn Indication: wound infection  No Known Allergies  Patient Measurements: Weight: 163 lb 5.8 oz (74.1 kg)  Vital Signs: Temp: 98.3 F (36.8 C) (08/02 1231) Temp Source: Oral (08/02 1231) BP: 125/71 mmHg (08/02 1830) Pulse Rate: 67 (08/02 1830) Intake/Output from previous day:   Intake/Output from this shift:    Labs: No results for input(s): WBC, HGB, PLT, LABCREA, CREATININE in the last 72 hours. CrCl cannot be calculated (Patient has no serum creatinine result on file.). No results for input(s): VANCOTROUGH, VANCOPEAK, VANCORANDOM, GENTTROUGH, GENTPEAK, GENTRANDOM, TOBRATROUGH, TOBRAPEAK, TOBRARND, AMIKACINPEAK, AMIKACINTROU, AMIKACIN in the last 72 hours.   Microbiology: No results found for this or any previous visit (from the past 720 hour(s)).  Medical History: Past Medical History  Diagnosis Date  . Coronary artery disease 07/05/2011    2D ECHO - EF ~40%, moderate concentric LV hypertrophy, LA moderately dilated, mild-moderate septal and inferior wall hypokinesis  . Hypertension   . CHF (congestive heart failure)   . Diabetes mellitus     Insulin dependent  . Cardiomyopathy   . Chronic kidney disease (CKD), stage IV (severe)   . Claudication 05/09/2011    Right and left anterior tibial arteries and Left SFA-occluded; Right CIA-<50% diameter reduction; Right Deep Profunda-70-99% diameter reduction; Right SFA->60% diameter reduction; Right Distal Popliteal/Tibial Artery- >60% diameter reduction; Left CFA and Profunda- >50% diameter reduction  . 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 GERD (gastroesophageal reflux disease)    Assessment: 49 yo male s/p right AKA on 6/13 presenting with open wound on right AKA  stump with drainage  PMH: HD TTS, A Fib on warfarin, DM  ID: wound infection - with probable higher amputation Wednesday  Zosyn 8/2>> Vanc 8/2>>  Goal of Therapy:  Pre-HD Level of 15 - 25 mcg/mL  Plan:  -Vanc 1750 mg x 1 now -Zosyn 2.25 gm IV q8h -F/U HD plans (possibly tonight) for further dosing - patient to OR Wed  Isaac Bliss, PharmD, BCPS Clinical Pharmacist Pager [phone] 11/25/2014 6:38 PM

## 2014-11-25 NOTE — Procedures (Signed)
I was present at this dialysis session. I have reviewed the session itself and made appropriate changes.   Sabra Heck  MD 11/25/2014, 8:10 PM

## 2014-11-25 NOTE — Consult Note (Signed)
[name] 11/25/2014 [name] Requesting Physician:  Hart Rochester MD  Reason for Consult:  Comanagement of ESRD HPI:  68M ESRD [facility]S [facility] via [facility] admitted from vascular surgery clinic with nonhealing right AKA stump. Plan is for operative exploration and possible revision of AKA tomorrow, 8/3. He did not receive hemodialysis treatment today at his kidney Center. He has been placed on vancomycin and Zosyn. His chronic anticoagulation, warfarin, is planned to be reversed with surgery.  Dialysis treatment adherence is good. No recent hemodialysis issues.    Filed Weights   11/25/14 1830  Weight: 74.1 kg (163 lb 5.8 oz)      ROS Balance of 12 systems is negative w/ exceptions as above  Outpt HD Orders Unit: [facility] Days: THS Time: 4h Dialyzer: 180 EDW: 68kg K/Ca: 2/2.25 Access: TDC, maturing AVF BFR/DFR: 400/800 UF Proflie: none VDRA: none EPO: aranesp 120 qWk IV Fe: venofer 50 qWk Heparin: 2000 IVB qTx on warfarin also Most Recent Phos / PTH: 8 / 203 Most Recent TSAT / Ferritin: 30/450 Most Recent eKT/V: 1.27 Treatment Adherence: good  PMH  Past Medical History  Diagnosis Date  . Coronary artery disease 07/05/2011    2D ECHO - EF ~40%, moderate concentric LV hypertrophy, LA moderately dilated, mild-moderate septal and inferior wall hypokinesis  . Hypertension   . CHF (congestive heart failure)   . Diabetes mellitus     Insulin dependent  . Cardiomyopathy   . Chronic kidney disease (CKD), stage IV (severe)   . Claudication 05/09/2011    Right and left anterior tibial arteries and Left SFA-occluded; Right CIA-<50% diameter reduction; Right Deep Profunda-70-99% diameter reduction; Right SFA->60% diameter reduction; Right Distal Popliteal/Tibial Artery- >60% diameter reduction; Left CFA and Profunda- >50% diameter reduction  . S/P CABG (coronary artery bypass graft) 03/23/2011`    STRESS TEST - LV EF 29%, mild reversible within the apical segment of the anterior  and anterolateral wall, small infarct in the inferolateral wall, global hypokinesia  . GERD (gastroesophageal reflux disease)    PSH  Past Surgical History  Procedure Laterality Date  . Cardiac catheterization  05/07/2008    CABG  . Coronary artery bypass graft  06/2008    x3  . Insertion of dialysis catheter Right 10/01/2014    Procedure: INSERTION OF RIGHT INTERNAL JUGULAR DIALYSIS CATHETER;  Surgeon: Larina Earthly, MD;  Location: [facility] OR;  Service: Vascular;  Laterality: Right;  . Amputation Right 10/06/2014    Procedure: AMPUTATION ABOVE KNEE Right;  Surgeon: Sherren Kerns, MD;  Location: [facility] OR;  Service: Vascular;  Laterality: Right;  . Av fistula placement Left 10/10/2014    Procedure: LEFT BRACHIAL TO CEPHALIC ARTERIOVENOUS (AV) FISTULA CREATION;  Surgeon: Larina Earthly, MD;  Location: [facility] OR;  Service: Vascular;  Laterality: Left;   FH  Family History  Problem Relation Age of Onset  . Heart disease Mother   . Hypertension Mother   . Diabetes Mother   . Arthritis Sister   . Diabetes Sister    SH  reports that he quit smoking about 14 months ago. His smoking use included Cigarettes. He has a 12.5 pack-year smoking history. He does not have any smokeless tobacco history on file. He reports that he drinks alcohol. He reports that he uses illicit drugs (Marijuana). Allergies No Known Allergies Home medications Prior to Admission medications   Medication Sig Start Date End Date Taking? Authorizing Provider  Amino Acids-Protein Hydrolys (FEEDING SUPPLEMENT, PRO-STAT SUGAR FREE 64,) LIQD Take  30 mLs by mouth 2 (two) times daily. 10/17/14   Albertine Grates, MD  amiodarone (PACERONE) 400 MG tablet Take 1 tablet (400 mg total) by mouth 2 (two) times daily. 10/17/14   Albertine Grates, MD  calcium acetate (PHOSLO) 667 MG capsule Take 2 capsules (1,334 mg total) by mouth 3 (three) times daily with meals. 10/17/14   Albertine Grates, MD  collagenase (SANTYL) ointment Apply topically daily. 10/17/14   Albertine Grates, MD  Darbepoetin  Alfa (ARANESP) 100 MCG/0.5ML SOSY injection Inject 0.5 mLs (100 mcg total) into the vein every Tuesday with hemodialysis. 10/17/14   Albertine Grates, MD  Ferrous Sulfate (IRON) 325 (65 FE) MG TABS Take 325 mg by mouth daily.    Historical Provider, MD  folic acid (FOLVITE) 1 MG tablet Take 1 tablet (1 mg total) by mouth daily. 10/17/14   Albertine Grates, MD  insulin aspart (NOVOLOG) 100 UNIT/ML injection Inject 0-9 Units into the skin 3 (three) times daily with meals. Before each meal 3 times a day, 140-199 - 2 units, 200-250 - 4 units, 251-299 - 6 units,  300-349 - 8 units,  350 or above 10 units. Insulin PEN if approved, provide syringes and needles if needed. Patient taking differently: Inject 0-10 Units into the skin 4 (four) times daily -  with meals and at bedtime. 140-199 - 2 units, 200-250 - 4 units, 251-299 - 6 units,  300-349 - 8 units,  350 or above 10 units. Insulin PEN if approved, provide syringes and needles if needed 10/18/14   Albertine Grates, MD  Melatonin 3 MG TABS Take 3 mg by mouth at bedtime.    Historical Provider, MD  midodrine (PROAMATINE) 10 MG tablet Take 1 tablet (10 mg total) by mouth Every Tuesday,Thursday,and Saturday with dialysis. 10/18/14   Albertine Grates, MD  Multiple Vitamins-Minerals (DECUBI-VITE PO) Take 1 tablet by mouth daily.    Historical Provider, MD  multivitamin (RENA-VIT) TABS tablet Take 1 tablet by mouth at bedtime. 10/17/14   Albertine Grates, MD  nitroGLYCERIN (NITROSTAT) 0.4 MG SL tablet Place 1 tablet (0.4 mg total) under the tongue once. Patient taking differently: Place 0.4 mg under the tongue every 5 (five) minutes as needed for chest pain.  10/01/13   Mihai Croitoru, MD  Nutritional Supplements (FEEDING SUPPLEMENT, NEPRO CARB STEADY,) LIQD Take 237 mLs by mouth 2 (two) times daily between meals. 10/17/14   Albertine Grates, MD  Oxycodone HCl 10 MG TABS Take one tablet by mouth every 4 hours as needed for breakthrough pain 11/21/14   Kirt Boys, DO  pantoprazole (PROTONIX) 40 MG tablet Take 40 mg by  mouth daily.  05/08/13   Historical Provider, MD  polyethylene glycol (MIRALAX / GLYCOLAX) packet Take 17 g by mouth daily as needed for moderate constipation. 10/17/14   Albertine Grates, MD  senna-docusate (SENOKOT-S) 8.6-50 MG per tablet Take 1 tablet by mouth 2 (two) times daily. 10/17/14   Albertine Grates, MD  vancomycin (VANCOCIN HCL) 125 MG capsule Take 1 capsule (125 mg total) by mouth 4 (four) times daily. 10/17/14   Albertine Grates, MD  warfarin (COUMADIN) 2 MG tablet Take 2 mg by mouth daily.    Historical Provider, MD  warfarin (COUMADIN) 2.5 MG tablet Take 1 tablet (2.5 mg total) by mouth daily. First dose start from Sunday 6/25 10/19/14   Albertine Grates, MD    Current Medications Scheduled Meds: . amiodarone  400 mg Oral BID  . calcium acetate  1,334 mg Oral TID WC  .  darbepoetin (ARANESP) injection - DIALYSIS  120 mcg Intravenous Q Tue-HD  . feeding supplement (NEPRO CARB STEADY)  237 mL Oral BID BM  . feeding supplement (PRO-STAT SUGAR FREE 64)  30 mL Oral BID  . ferric gluconate (FERRLECIT/NULECIT) IV  62.5 mg Intravenous Weekly  . folic acid  1 mg Oral Daily  . [START ON 11/26/2014] insulin aspart  0-9 Units Subcutaneous TID WC  . Iron  325 mg Oral Daily  . [START ON 11/27/2014] midodrine  10 mg Oral Q T,Th,Sa-HD  . multivitamin  1 tablet Oral QHS  . nitroGLYCERIN  0.4 mg Sublingual Once  . pantoprazole  40 mg Oral Daily  . piperacillin-tazobactam (ZOSYN)  IV  2.25 g Intravenous Q8H  . senna-docusate  1 tablet Oral BID  . sodium chloride  3 mL Intravenous Q12H  . vancomycin  1,750 mg Intravenous Once  . vancomycin  125 mg Oral QID  . warfarin  2 mg Oral Daily   Continuous Infusions:  PRN Meds:.sodium chloride, sodium chloride, sodium chloride, acetaminophen **OR** acetaminophen, alteplase, feeding supplement (NEPRO CARB STEADY), guaiFENesin-dextromethorphan, heparin, hydrALAZINE, labetalol, lidocaine (PF), lidocaine-prilocaine, metoprolol, morphine injection, ondansetron, oxyCODONE,  pentafluoroprop-tetrafluoroeth, phenol, polyethylene glycol, sodium chloride  CBC No results for input(s): WBC, NEUTROABS, HGB, HCT, MCV, PLT in the last 168 hours. Basic Metabolic Panel No results for input(s): NA, K, CL, CO2, GLUCOSE, BUN, CREATININE, ALB, CALCIUM, PHOS in the last 168 hours.  Physical Exam  Blood pressure 125/71, pulse 67, resp. rate 18, weight 74.1 kg (163 lb 5.8 oz), SpO2 97 %. GEN: NAD, frail, low musclemass ENT: Poor dentition EYES: EOMI,  CV: RRR PULM: CTAB ABD: s/nd/nd SKIN: multiple tattoos, R IJ TDC LUE AVF + B/T EXT:no LEE   A/P 1. ESRD:  1. TH[facility], EDW 68kg using TDC 2. Keep on schedule, HD tonight 3. Hold heparin bolus tonight 4. Will follow along 2. PVD s/p AKA and drainage / wound dehiscence: 1. To OR 8/3 2. Vanc Zosyn per TRH 3. Can assist with long term ABX upon discharge if indicated 3. HTN/Vol:  1. 68kg EDW reasonable pre-op 2. Follow post, BP well controlled as outpt not on BP meds 3. Uses midodrine with HD 4. Anemia:   1. Cont aranesp, next dose due 8/4 2. Anemia reflects #2 5. MBD:   1. PTH controlled w/o VDRA or cinacalcet 2. Phos elevated as outpt on PhosLo, follow as inpatient, cont 2qAC 6. AFib on Anticoagulation 7. Recent C Diff: monitor for diarrhea 8. DM2 9. Systolic HF 10. CAD s/p CABG  Sabra Heck MD 11/25/2014, 7:37 PM

## 2014-11-25 NOTE — Progress Notes (Signed)
POST OPERATIVE OFFICE NOTE    CC:  F/u for surgery  HPI:  This is a 49 y.o. 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 Darrick Penna.  He dialyzes T/T/S and has not had HD today.  He currently dialyzes via a diatek catheter, which was placed by Dr. Arbie Cookey on 10/01/14. On 11/06/14, the antecubital incision was noted to be well healed, however, this had opened after transfer.  He did receive 2 simple stitches at close the skin edges.   He presents today with open wound on his right AKA stump with drainage.  He has not had any fevers.  He states that sometimes his wounds get packed and sometimes they don't.  He is not on any antibiotics that he knows of.   He is on coumadin for Afib.  No Known Allergies  Current Outpatient Prescriptions  Medication Sig Dispense Refill  . Amino Acids-Protein Hydrolys (FEEDING SUPPLEMENT, PRO-STAT SUGAR FREE 64,) LIQD Take 30 mLs by mouth 2 (two) times daily. 900 mL 0  . amiodarone (PACERONE) 400 MG tablet Take 1 tablet (400 mg total) by mouth 2 (two) times daily. 60 tablet 0  . calcium acetate (PHOSLO) 667 MG capsule Take 2 capsules (1,334 mg total) by mouth 3 (three) times daily with meals. 90 capsule 0  . collagenase (SANTYL) ointment Apply topically daily. 15 g 0  . Darbepoetin Alfa (ARANESP) 100 MCG/0.5ML SOSY injection Inject 0.5 mLs (100 mcg total) into the vein every Tuesday with hemodialysis. 4.2 mL 0  . Ferrous Sulfate (IRON) 325 (65 FE) MG TABS Take 325 mg by mouth daily.    . folic acid (FOLVITE) 1 MG tablet Take 1 tablet (1 mg total) by mouth daily. 30 tablet 0  . insulin aspart (NOVOLOG) 100 UNIT/ML injection Inject 0-9 Units into the skin 3 (three) times daily with meals. Before each meal 3 times a day, 140-199 - 2 units, 200-250 - 4 units, 251-299 - 6 units,  300-349 - 8 units,  350 or above 10 units. Insulin PEN if approved, provide syringes and needles if needed. (Patient taking differently: Inject 0-10 Units into the skin 4 (four) times daily -  with  meals and at bedtime. 140-199 - 2 units, 200-250 - 4 units, 251-299 - 6 units,  300-349 - 8 units,  350 or above 10 units. Insulin PEN if approved, provide syringes and needles if needed) 10 mL 11  . Melatonin 3 MG TABS Take 3 mg by mouth at bedtime.    . midodrine (PROAMATINE) 10 MG tablet Take 1 tablet (10 mg total) by mouth Every Tuesday,Thursday,and Saturday with dialysis. 30 tablet 0  . Multiple Vitamins-Minerals (DECUBI-VITE PO) Take 1 tablet by mouth daily.    . multivitamin (RENA-VIT) TABS tablet Take 1 tablet by mouth at bedtime. 30 tablet 0  . nitroGLYCERIN (NITROSTAT) 0.4 MG SL tablet Place 1 tablet (0.4 mg total) under the tongue once. (Patient taking differently: Place 0.4 mg under the tongue every 5 (five) minutes as needed for chest pain. ) 25 tablet 5  . Nutritional Supplements (FEEDING SUPPLEMENT, NEPRO CARB STEADY,) LIQD Take 237 mLs by mouth 2 (two) times daily between meals. 30 Can 0  . Oxycodone HCl 10 MG TABS Take one tablet by mouth every 4 hours as needed for breakthrough pain 180 tablet 0  . pantoprazole (PROTONIX) 40 MG tablet Take 40 mg by mouth daily.     . polyethylene glycol (MIRALAX / GLYCOLAX) packet Take 17 g by mouth daily  as needed for moderate constipation. 14 each 0  . senna-docusate (SENOKOT-S) 8.6-50 MG per tablet Take 1 tablet by mouth 2 (two) times daily. 30 tablet 0  . vancomycin (VANCOCIN HCL) 125 MG capsule Take 1 capsule (125 mg total) by mouth 4 (four) times daily. 28 capsule 0  . warfarin (COUMADIN) 2 MG tablet Take 2 mg by mouth daily.    Marland Kitchen warfarin (COUMADIN) 2.5 MG tablet Take 1 tablet (2.5 mg total) by mouth daily. First dose start from Sunday 6/25 30 tablet 0   No current facility-administered medications for this visit.     ROS:  See HPI  Physical Exam:  Filed Vitals:   11/25/14 1231  BP: 122/54  Pulse: 68  Temp: 98.3 F (36.8 C)  Resp: 12    Incision:  2 sinus tracks on right AKA stump one is medial and one midline that track ~ 4-5  cm with a cotton tip applicator.  There is purulent drainage present.  Extremities:  Good movement of right AKA stump.    Assessment/Plan:  This is a 49 y.o. male who is s/p: Right AKA now with 2 sinus tracks ~ 4-5cm tracking with cotton tip applicator with drainage.   -will admit to hospital and place on IV ABx (vanc/Zosyn per pharmacy) -he will need HD today as he is supposed to dialyze T/T/S-will contact the renal service -he will have to have his INR checked stat as he is on coumadin and may need FFP to reverse his coumadin for revision of his right AKA tomorrow by Dr. Darrick Penna -all other labs will also be checked stat  -he has also been pre-op'd for surgery tomorrow-npo after MN and labs in the am.   Doreatha Massed, PA-C [facility] [phone]  Clinic MD:  Pt seen and examined with Dr. Darrick Penna   Addendum: Pt is on Coumadin 0.5mg  M/T AND 1.0mg  W-F and Saturday  Last INR on 11/21/14 was 3.0  He is not on any Abx at admission (he was on Doxycycline and this was discontinued 11/18/14).  Doreatha Massed 11/25/2014 1:38 PM  Agree with above assessment Patient has 2 significant draining sinuses which are quite deep in the posterior aspect of the right AKA stump Will require exploration in the OR and likely will require higher right AKA Scheduled for surgery tomorrow about midday by Dr. Darrick Penna. Will correct INR tonight with fresh frozen plasma after lab values are available and called to Dr. Edilia Bo

## 2014-11-25 NOTE — Telephone Encounter (Signed)
Phone call from [facility].  Reported pt. Has 2 areas of wound dehiscence @ right AKA site.  Nurse reported that the 2 open areas are joined by a tunnel.  Also reported left LE venous ulcer site is worsening; described that multiple new small open areas are forming surrounding the venous ulcer.  Reported edema in left LE, and ulcer sites are weeping.   Appt. Given for 10:45 AM today.  Stated pt. Would be brought to office at stated time.

## 2014-11-26 ENCOUNTER — Inpatient Hospital Stay (HOSPITAL_COMMUNITY): Admission: RE | Admit: 2014-11-26 | Payer: Medicare Other | Source: Ambulatory Visit | Admitting: Vascular Surgery

## 2014-11-26 ENCOUNTER — Encounter (HOSPITAL_COMMUNITY)
Admission: AD | Disposition: A | Discharge status: Skilled Nursing Facility | Payer: Self-pay | Source: Ambulatory Visit | Attending: Vascular Surgery

## 2014-11-26 ENCOUNTER — Inpatient Hospital Stay (HOSPITAL_COMMUNITY): Payer: Medicare Other | Admitting: Certified Registered Nurse Anesthetist

## 2014-11-26 ENCOUNTER — Encounter (HOSPITAL_COMMUNITY): Payer: Self-pay | Admitting: Certified Registered Nurse Anesthetist

## 2014-11-26 DIAGNOSIS — T8189XA Other complications of procedures, not elsewhere classified, initial encounter: Secondary | ICD-10-CM

## 2014-11-26 DIAGNOSIS — Z89619 Acquired absence of unspecified leg above knee: Secondary | ICD-10-CM

## 2014-11-26 HISTORY — DX: Acquired absence of unspecified leg above knee: Z89.619

## 2014-11-26 HISTORY — PX: AMPUTATION: SHX166

## 2014-11-26 LAB — RENAL FUNCTION PANEL
Albumin: 2.4 g/dL — ABNORMAL LOW (ref 3.5–5.0)
Anion gap: 15 (ref 5–15)
BUN: 36 mg/dL — ABNORMAL HIGH (ref 6–20)
CALCIUM: 8.2 mg/dL — AB (ref 8.9–10.3)
CO2: 25 mmol/L (ref 22–32)
CREATININE: 4.25 mg/dL — AB (ref 0.61–1.24)
Chloride: 97 mmol/L — ABNORMAL LOW (ref 101–111)
GFR calc Af Amer: 17 mL/min — ABNORMAL LOW (ref >60)
GFR calc non Af Amer: 15 mL/min — ABNORMAL LOW (ref >60)
Glucose, Bld: 109 mg/dL — ABNORMAL HIGH (ref 65–99)
PHOSPHORUS: 7.5 mg/dL — AB (ref 2.5–4.6)
Potassium: 3.4 mmol/L — ABNORMAL LOW (ref 3.5–5.1)
Sodium: 137 mmol/L (ref 135–145)

## 2014-11-26 LAB — GLUCOSE, CAPILLARY
GLUCOSE-CAPILLARY: 97 mg/dL (ref 65–99)
GLUCOSE-CAPILLARY: 108 mg/dL — AB (ref 65–99)
GLUCOSE-CAPILLARY: 117 mg/dL — AB (ref 65–99)
Glucose-Capillary: 101 mg/dL — ABNORMAL HIGH (ref 65–99)

## 2014-11-26 LAB — PROTIME-INR
INR: 2.03 — AB (ref 0.00–1.49)
Prothrombin Time: 22.9 seconds — ABNORMAL HIGH (ref 11.6–15.2)

## 2014-11-26 LAB — CBC
HCT: 24.9 % — ABNORMAL LOW (ref 39.0–52.0)
HCT: 23.1 % — ABNORMAL LOW (ref 39.0–52.0)
HEMOGLOBIN: 8 g/dL — AB (ref 13.0–17.0)
Hemoglobin: 7.4 g/dL — ABNORMAL LOW (ref 13.0–17.0)
MCH: 29.7 pg (ref 26.0–34.0)
MCH: 30 pg (ref 26.0–34.0)
MCHC: 32.1 g/dL (ref 30.0–36.0)
MCHC: 32 g/dL (ref 30.0–36.0)
MCV: 92.6 fL (ref 78.0–100.0)
MCV: 93.5 fL (ref 78.0–100.0)
PLATELETS: 94 10*3/uL — AB (ref 150–400)
PLATELETS: 91 10*3/uL — AB (ref 150–400)
RBC: 2.69 MIL/uL — AB (ref 4.22–5.81)
RBC: 2.47 MIL/uL — ABNORMAL LOW (ref 4.22–5.81)
RDW: 18.8 % — AB (ref 11.5–15.5)
RDW: 18.8 % — AB (ref 11.5–15.5)
WBC: 8.2 10*3/uL (ref 4.0–10.5)
WBC: 7.5 10*3/uL (ref 4.0–10.5)

## 2014-11-26 LAB — BASIC METABOLIC PANEL
ANION GAP: 12 (ref 5–15)
BUN: 30 mg/dL — ABNORMAL HIGH (ref 6–20)
CO2: 28 mmol/L (ref 22–32)
CREATININE: 3.59 mg/dL — AB (ref 0.61–1.24)
Calcium: 8.1 mg/dL — ABNORMAL LOW (ref 8.9–10.3)
Chloride: 97 mmol/L — ABNORMAL LOW (ref 101–111)
GFR calc Af Amer: 21 mL/min — ABNORMAL LOW (ref >60)
GFR calc non Af Amer: 18 mL/min — ABNORMAL LOW (ref >60)
Glucose, Bld: 107 mg/dL — ABNORMAL HIGH (ref 65–99)
Potassium: 3.2 mmol/L — ABNORMAL LOW (ref 3.5–5.1)
SODIUM: 137 mmol/L (ref 135–145)

## 2014-11-26 LAB — CREATININE, SERUM
Creatinine, Ser: 4.19 mg/dL — ABNORMAL HIGH (ref 0.61–1.24)
GFR calc Af Amer: 18 mL/min — ABNORMAL LOW (ref >60)
GFR calc non Af Amer: 15 mL/min — ABNORMAL LOW (ref >60)

## 2014-11-26 SURGERY — AMPUTATION, ABOVE KNEE
Anesthesia: General | Site: Leg Upper | Laterality: Right

## 2014-11-26 MED ORDER — OXYCODONE HCL 5 MG/5ML PO SOLN: 5.0000 mg | Freq: Once | ORAL | Status: AC | PRN

## 2014-11-26 MED ORDER — SUCCINYLCHOLINE CHLORIDE 20 MG/ML IJ SOLN
INTRAMUSCULAR | Status: DC | PRN
  Administered 2014-11-26: 120 mg via INTRAVENOUS

## 2014-11-26 MED ORDER — ENSURE ENLIVE PO LIQD
237.0000 mL | Freq: Three times a day (TID) | ORAL | Status: DC
  Administered 2014-11-26 – 2014-11-27 (×2): 237 mL via ORAL

## 2014-11-26 MED ORDER — FENTANYL CITRATE (PF) 100 MCG/2ML IJ SOLN
25.0000 ug | INTRAMUSCULAR | Status: DC | PRN
  Administered 2014-11-26 (×3): 50 ug via INTRAVENOUS

## 2014-11-26 MED ORDER — VITAMIN K1 10 MG/ML IJ SOLN
1.0000 mg | Freq: Once | INTRAVENOUS | Status: AC
  Administered 2014-11-26: 1 mg via INTRAVENOUS
  Filled 2014-11-26: qty 0.1

## 2014-11-26 MED ORDER — HEPARIN SODIUM (PORCINE) 5000 UNIT/ML IJ SOLN
5000.0000 [IU] | Freq: Three times a day (TID) | INTRAMUSCULAR | Status: DC
  Administered 2014-11-27 – 2014-11-28 (×3): 5000 [IU] via SUBCUTANEOUS
  Filled 2014-11-26 (×8): qty 1

## 2014-11-26 MED ORDER — ONDANSETRON HCL 4 MG/2ML IJ SOLN
INTRAMUSCULAR | Status: DC | PRN
  Administered 2014-11-26: 4 mg via INTRAVENOUS

## 2014-11-26 MED ORDER — HYDRALAZINE HCL 20 MG/ML IJ SOLN: 5.0000 mg | INTRAMUSCULAR | Status: DC | PRN

## 2014-11-26 MED ORDER — LIDOCAINE HCL (CARDIAC) 20 MG/ML IV SOLN
INTRAVENOUS | Status: DC | PRN
  Administered 2014-11-26: 30 mg via INTRAVENOUS

## 2014-11-26 MED ORDER — PHENOL 1.4 % MT LIQD
1.0000 | OROMUCOSAL | Status: DC | PRN
  Filled 2014-11-26: qty 177

## 2014-11-26 MED ORDER — FENTANYL CITRATE (PF) 100 MCG/2ML IJ SOLN
INTRAMUSCULAR | Status: DC | PRN
  Administered 2014-11-26: 100 ug via INTRAVENOUS
  Administered 2014-11-26: 50 ug via INTRAVENOUS
  Administered 2014-11-26: 100 ug via INTRAVENOUS
  Administered 2014-11-26: 50 ug via INTRAVENOUS
  Administered 2014-11-26: 100 ug via INTRAVENOUS

## 2014-11-26 MED ORDER — SODIUM CHLORIDE 0.9 % IJ SOLN
3.0000 mL | Freq: Two times a day (BID) | INTRAMUSCULAR | Status: DC
  Administered 2014-11-26 – 2014-11-28 (×5): 3 mL via INTRAVENOUS

## 2014-11-26 MED ORDER — OXYCODONE HCL 5 MG PO TABS
5.0000 mg | ORAL_TABLET | Freq: Once | ORAL | Status: AC | PRN
  Administered 2014-11-26: 5 mg via ORAL

## 2014-11-26 MED ORDER — SODIUM CHLORIDE 0.9 % IJ SOLN: 3.0000 mL | INTRAMUSCULAR | Status: DC | PRN

## 2014-11-26 MED ORDER — PROPOFOL 10 MG/ML IV BOLUS
INTRAVENOUS | Status: DC | PRN
  Administered 2014-11-26: 90 mg via INTRAVENOUS

## 2014-11-26 MED ORDER — SODIUM CHLORIDE 0.9 % IV SOLN
10.0000 mg | INTRAVENOUS | Status: DC | PRN
  Administered 2014-11-26: 10 ug/min via INTRAVENOUS

## 2014-11-26 MED ORDER — MORPHINE SULFATE 2 MG/ML IJ SOLN
2.0000 mg | INTRAMUSCULAR | Status: DC | PRN
  Administered 2014-11-26 – 2014-11-27 (×4): 2 mg via INTRAVENOUS
  Filled 2014-11-26 (×4): qty 1

## 2014-11-26 MED ORDER — MIDAZOLAM HCL 2 MG/2ML IJ SOLN
1.0000 mg | Freq: Once | INTRAMUSCULAR | Status: AC
  Administered 2014-11-26: 1 mg via INTRAVENOUS

## 2014-11-26 MED ORDER — SODIUM CHLORIDE 0.9 % IV SOLN: Freq: Once | INTRAVENOUS | Status: DC

## 2014-11-26 MED ORDER — ACETAMINOPHEN 650 MG RE SUPP: 325.0000 mg | RECTAL | Status: DC | PRN

## 2014-11-26 MED ORDER — ONDANSETRON HCL 4 MG/2ML IJ SOLN: 4.0000 mg | Freq: Once | INTRAMUSCULAR | Status: DC | PRN

## 2014-11-26 MED ORDER — LABETALOL HCL 5 MG/ML IV SOLN
10.0000 mg | INTRAVENOUS | Status: DC | PRN
  Filled 2014-11-26: qty 4

## 2014-11-26 MED ORDER — ARTIFICIAL TEARS OP OINT
TOPICAL_OINTMENT | OPHTHALMIC | Status: DC | PRN
  Administered 2014-11-26: 1 via OPHTHALMIC

## 2014-11-26 MED ORDER — GUAIFENESIN-DM 100-10 MG/5ML PO SYRP
15.0000 mL | ORAL_SOLUTION | ORAL | Status: DC | PRN
Start: 2014-11-26 — End: 2014-11-28

## 2014-11-26 MED ORDER — ONDANSETRON HCL 4 MG/2ML IJ SOLN: 4.0000 mg | Freq: Four times a day (QID) | INTRAMUSCULAR | Status: DC | PRN

## 2014-11-26 MED ORDER — SODIUM CHLORIDE 0.9 % IV SOLN: 250.0000 mL | INTRAVENOUS | Status: DC | PRN

## 2014-11-26 MED ORDER — 0.9 % SODIUM CHLORIDE (POUR BTL) OPTIME
TOPICAL | Status: DC | PRN
  Administered 2014-11-26: 1000 mL

## 2014-11-26 MED ORDER — BISACODYL 10 MG RE SUPP: 10.0000 mg | Freq: Every day | RECTAL | Status: DC | PRN

## 2014-11-26 MED ORDER — METOPROLOL TARTRATE 1 MG/ML IV SOLN
2.0000 mg | INTRAVENOUS | Status: DC | PRN
Start: 2014-11-26 — End: 2014-11-28

## 2014-11-26 MED ORDER — ACETAMINOPHEN 325 MG PO TABS
325.0000 mg | ORAL_TABLET | ORAL | Status: DC | PRN
  Filled 2014-11-26 (×2): qty 2

## 2014-11-26 SURGICAL SUPPLY — 43 items
BANDAGE ELASTIC 4 VELCRO ST LF (GAUZE/BANDAGES/DRESSINGS) ×1 IMPLANT
BANDAGE ELASTIC 6 VELCRO ST LF (GAUZE/BANDAGES/DRESSINGS) ×2 IMPLANT
BLADE SAW RECIP 87.9 MT (BLADE) ×2 IMPLANT
BNDG COHESIVE 4X5 TAN STRL (GAUZE/BANDAGES/DRESSINGS) ×2 IMPLANT
BNDG COHESIVE 6X5 TAN STRL LF (GAUZE/BANDAGES/DRESSINGS) ×2 IMPLANT
BNDG GAUZE ELAST 4 BULKY (GAUZE/BANDAGES/DRESSINGS) ×3 IMPLANT
CANISTER SUCTION 2500CC (MISCELLANEOUS) ×2 IMPLANT
CLIP TI MEDIUM 6 (CLIP) ×1 IMPLANT
COVER SURGICAL LIGHT HANDLE (MISCELLANEOUS) ×2 IMPLANT
COVER TABLE BACK 60X90 (DRAPES) ×2 IMPLANT
DRAIN CHANNEL 19F RND (DRAIN) IMPLANT
DRAPE ORTHO SPLIT 77X108 STRL (DRAPES) ×4
DRAPE PROXIMA HALF (DRAPES) ×2 IMPLANT
DRAPE SURG ORHT 6 SPLT 77X108 (DRAPES) ×2 IMPLANT
DRSG ADAPTIC 3X8 NADH LF (GAUZE/BANDAGES/DRESSINGS) ×3 IMPLANT
ELECT REM PT RETURN 9FT ADLT (ELECTROSURGICAL) ×2
ELECTRODE REM PT RTRN 9FT ADLT (ELECTROSURGICAL) ×1 IMPLANT
EVACUATOR SILICONE 100CC (DRAIN) IMPLANT
GAUZE SPONGE 4X4 12PLY STRL (GAUZE/BANDAGES/DRESSINGS) ×2 IMPLANT
GLOVE BIO SURGEON STRL SZ7.5 (GLOVE) ×2 IMPLANT
GLOVE BIOGEL PI IND STRL 8 (GLOVE) IMPLANT
GLOVE BIOGEL PI INDICATOR 8 (GLOVE) ×1
GOWN STRL REUS W/ TWL LRG LVL3 (GOWN DISPOSABLE) ×3 IMPLANT
GOWN STRL REUS W/TWL LRG LVL3 (GOWN DISPOSABLE) ×6
KIT BASIN OR (CUSTOM PROCEDURE TRAY) ×2 IMPLANT
KIT ROOM TURNOVER OR (KITS) ×2 IMPLANT
NS IRRIG 1000ML POUR BTL (IV SOLUTION) ×2 IMPLANT
PACK GENERAL/GYN (CUSTOM PROCEDURE TRAY) ×2 IMPLANT
PAD ARMBOARD 7.5X6 YLW CONV (MISCELLANEOUS) ×4 IMPLANT
SPONGE GAUZE 4X4 12PLY STER LF (GAUZE/BANDAGES/DRESSINGS) ×2 IMPLANT
STAPLER VISISTAT 35W (STAPLE) ×2 IMPLANT
STOCKINETTE IMPERVIOUS LG (DRAPES) ×2 IMPLANT
SUT ETHILON 3 0 PS 1 (SUTURE) IMPLANT
SUT SILK 2 0 SH (SUTURE) ×1 IMPLANT
SUT SILK 2 0 TIES 10X30 (SUTURE) ×3 IMPLANT
SUT SILK 3 0 (SUTURE) ×2
SUT SILK 3-0 18XBRD TIE 12 (SUTURE) IMPLANT
SUT VIC AB 2-0 CT1 18 (SUTURE) ×4 IMPLANT
SUT VIC AB 2-0 SH 18 (SUTURE) ×4 IMPLANT
SUT VIC AB 3-0 SH 27 (SUTURE) ×2
SUT VIC AB 3-0 SH 27X BRD (SUTURE) ×2 IMPLANT
UNDERPAD 30X30 INCONTINENT (UNDERPADS AND DIAPERS) ×2 IMPLANT
WATER STERILE IRR 1000ML POUR (IV SOLUTION) ×2 IMPLANT

## 2014-11-26 NOTE — H&P (View-Only) (Signed)
POST OPERATIVE OFFICE NOTE    CC:  F/u for surgery  HPI:  This is a 49 y.o. 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 Darrick Penna.  He dialyzes T/T/S and has not had HD today.  He currently dialyzes via a diatek catheter, which was placed by Dr. Arbie Cookey on 10/01/14. On 11/06/14, the antecubital incision was noted to be well healed, however, this had opened after transfer.  He did receive 2 simple stitches at close the skin edges.   He presents today with open wound on his right AKA stump with drainage.  He has not had any fevers.  He states that sometimes his wounds get packed and sometimes they don't.  He is not on any antibiotics that he knows of.   He is on coumadin for Afib.  No Known Allergies  Current Outpatient Prescriptions  Medication Sig Dispense Refill  . Amino Acids-Protein Hydrolys (FEEDING SUPPLEMENT, PRO-STAT SUGAR FREE 64,) LIQD Take 30 mLs by mouth 2 (two) times daily. 900 mL 0  . amiodarone (PACERONE) 400 MG tablet Take 1 tablet (400 mg total) by mouth 2 (two) times daily. 60 tablet 0  . calcium acetate (PHOSLO) 667 MG capsule Take 2 capsules (1,334 mg total) by mouth 3 (three) times daily with meals. 90 capsule 0  . collagenase (SANTYL) ointment Apply topically daily. 15 g 0  . Darbepoetin Alfa (ARANESP) 100 MCG/0.5ML SOSY injection Inject 0.5 mLs (100 mcg total) into the vein every Tuesday with hemodialysis. 4.2 mL 0  . Ferrous Sulfate (IRON) 325 (65 FE) MG TABS Take 325 mg by mouth daily.    . folic acid (FOLVITE) 1 MG tablet Take 1 tablet (1 mg total) by mouth daily. 30 tablet 0  . insulin aspart (NOVOLOG) 100 UNIT/ML injection Inject 0-9 Units into the skin 3 (three) times daily with meals. Before each meal 3 times a day, 140-199 - 2 units, 200-250 - 4 units, 251-299 - 6 units,  300-349 - 8 units,  350 or above 10 units. Insulin PEN if approved, provide syringes and needles if needed. (Patient taking differently: Inject 0-10 Units into the skin 4 (four) times daily -  with  meals and at bedtime. 140-199 - 2 units, 200-250 - 4 units, 251-299 - 6 units,  300-349 - 8 units,  350 or above 10 units. Insulin PEN if approved, provide syringes and needles if needed) 10 mL 11  . Melatonin 3 MG TABS Take 3 mg by mouth at bedtime.    . midodrine (PROAMATINE) 10 MG tablet Take 1 tablet (10 mg total) by mouth Every Tuesday,Thursday,and Saturday with dialysis. 30 tablet 0  . Multiple Vitamins-Minerals (DECUBI-VITE PO) Take 1 tablet by mouth daily.    . multivitamin (RENA-VIT) TABS tablet Take 1 tablet by mouth at bedtime. 30 tablet 0  . nitroGLYCERIN (NITROSTAT) 0.4 MG SL tablet Place 1 tablet (0.4 mg total) under the tongue once. (Patient taking differently: Place 0.4 mg under the tongue every 5 (five) minutes as needed for chest pain. ) 25 tablet 5  . Nutritional Supplements (FEEDING SUPPLEMENT, NEPRO CARB STEADY,) LIQD Take 237 mLs by mouth 2 (two) times daily between meals. 30 Can 0  . Oxycodone HCl 10 MG TABS Take one tablet by mouth every 4 hours as needed for breakthrough pain 180 tablet 0  . pantoprazole (PROTONIX) 40 MG tablet Take 40 mg by mouth daily.     . polyethylene glycol (MIRALAX / GLYCOLAX) packet Take 17 g by mouth daily  as needed for moderate constipation. 14 each 0  . senna-docusate (SENOKOT-S) 8.6-50 MG per tablet Take 1 tablet by mouth 2 (two) times daily. 30 tablet 0  . vancomycin (VANCOCIN HCL) 125 MG capsule Take 1 capsule (125 mg total) by mouth 4 (four) times daily. 28 capsule 0  . warfarin (COUMADIN) 2 MG tablet Take 2 mg by mouth daily.    Marland Kitchen warfarin (COUMADIN) 2.5 MG tablet Take 1 tablet (2.5 mg total) by mouth daily. First dose start from Sunday 6/25 30 tablet 0   No current facility-administered medications for this visit.     ROS:  See HPI  Physical Exam:  Filed Vitals:   11/25/14 1231  BP: 122/54  Pulse: 68  Temp: 98.3 F (36.8 C)  Resp: 12    Incision:  2 sinus tracks on right AKA stump one is medial and one midline that track ~ 4-5  cm with a cotton tip applicator.  There is purulent drainage present.  Extremities:  Good movement of right AKA stump.    Assessment/Plan:  This is a 49 y.o. male who is s/p: Right AKA now with 2 sinus tracks ~ 4-5cm tracking with cotton tip applicator with drainage.   -will admit to hospital and place on IV ABx (vanc/Zosyn per pharmacy) -he will need HD today as he is supposed to dialyze T/T/S-will contact the renal service -he will have to have his INR checked stat as he is on coumadin and may need FFP to reverse his coumadin for revision of his right AKA tomorrow by Dr. Darrick Penna -all other labs will also be checked stat  -he has also been pre-op'd for surgery tomorrow-npo after MN and labs in the am.   Doreatha Massed, PA-C [facility] [phone]  Clinic MD:  Pt seen and examined with Dr. Darrick Penna   Addendum: Pt is on Coumadin 0.5mg  M/T AND 1.0mg  W-F and Saturday  Last INR on 11/21/14 was 3.0  He is not on any Abx at admission (he was on Doxycycline and this was discontinued 11/18/14).  Doreatha Massed 11/25/2014 1:38 PM  Agree with above assessment Patient has 2 significant draining sinuses which are quite deep in the posterior aspect of the right AKA stump Will require exploration in the OR and likely will require higher right AKA Scheduled for surgery tomorrow about midday by Dr. Darrick Penna. Will correct INR tonight with fresh frozen plasma after lab values are available and called to Dr. Edilia Bo

## 2014-11-26 NOTE — Anesthesia Procedure Notes (Signed)
Procedure Name: Intubation Date/Time: 11/26/2014 11:39 AM Performed by: Bishop Limbo Pre-anesthesia Checklist: Patient identified, Emergency Drugs available, Suction available, Patient being monitored and Timeout performed Patient Re-evaluated:Patient Re-evaluated prior to inductionOxygen Delivery Method: Circle system utilized Preoxygenation: Pre-oxygenation with 100% oxygen Intubation Type: IV induction Laryngoscope Size: Mac and 3 Grade View: Grade I Tube type: Oral Tube size: 7.5 mm Number of attempts: 1 Airway Equipment and Method: Stylet Placement Confirmation: ETT inserted through vocal cords under direct vision,  CO2 detector and breath sounds checked- equal and bilateral Secured at: 22 cm Tube secured with: Tape Dental Injury: Teeth and Oropharynx as per pre-operative assessment  Comments: AOI by A Holtzman SRNA

## 2014-11-26 NOTE — Progress Notes (Signed)
Pt returns from OR to [id number], alert and oriented, vitals stable, continue to c/o of pain 10/10. Pt repositioned and surgical site elevated. Will continue to monitor pt.

## 2014-11-26 NOTE — Anesthesia Postprocedure Evaluation (Signed)
  Anesthesia Post-op Note  Patient: [name]  Procedure(s) Performed: Procedure(s): REVISION AMPUTATION ABOVE KNEE (Right)  Patient Location: PACU  Anesthesia Type:General  Level of Consciousness: awake, alert  and oriented  Airway and Oxygen Therapy: Patient Spontanous Breathing and Patient connected to nasal cannula oxygen  Post-op Pain: moderate  Post-op Assessment: Post-op Vital signs reviewed, Patient's Cardiovascular Status Stable, Respiratory Function Stable, Patent Airway and No signs of Nausea or vomiting              Post-op Vital Signs: stable  Last Vitals:  Filed Vitals:   11/26/14 1505  BP: 128/78  Pulse: 82  Temp: 37 C  Resp: 16    Complications: No apparent anesthesia complications

## 2014-11-26 NOTE — Interval H&P Note (Signed)
History and Physical Interval Note:  11/26/2014 10:08 AM  [name]  has presented today for surgery, with the diagnosis of Non-healing right above knee amputation T81.89X A  The various methods of treatment have been discussed with the patient and family. After consideration of risks, benefits and other options for treatment, the patient has consented to  Procedure(s): REVISION AMPUTATION ABOVE KNEE (Right) as a surgical intervention .  The patient's history has been reviewed, patient examined, no change in status, stable for surgery.  I have reviewed the patient's chart and labs.  Questions were answered to the patient's satisfaction.     Fabienne Bruns

## 2014-11-26 NOTE — Anesthesia Preprocedure Evaluation (Signed)
Anesthesia Evaluation  Patient identified by MRN, date of birth, ID band Patient awake    Reviewed: Allergy & Precautions, NPO status , Patient's Chart, lab work & pertinent test results  Airway Mallampati: II  TM Distance: >3 FB Neck ROM: Full    Dental  (+) Dental Advisory Given, Poor Dentition, Loose,    Pulmonary former smoker,  breath sounds clear to auscultation        Cardiovascular hypertension, Rhythm:Regular Rate:Normal     Neuro/Psych    GI/Hepatic   Endo/Other  diabetes  Renal/GU      Musculoskeletal   Abdominal   Peds  Hematology   Anesthesia Other Findings   Reproductive/Obstetrics                             Anesthesia Physical Anesthesia Plan  ASA: III  Anesthesia Plan: General   Post-op Pain Management:    Induction: Intravenous  Airway Management Planned: Oral ETT  Additional Equipment:   Intra-op Plan:   Post-operative Plan: Extubation in OR  Informed Consent: I have reviewed the patients History and Physical, chart, labs and discussed the procedure including the risks, benefits and alternatives for the proposed anesthesia with the patient or authorized representative who has indicated his/her understanding and acceptance.   Dental advisory given  Plan Discussed with: CRNA and Anesthesiologist  Anesthesia Plan Comments:         Anesthesia Quick Evaluation

## 2014-11-26 NOTE — Progress Notes (Signed)
Patient ID: [name], male   DOB: [birth year], 49 y.o.   MRN: [id number]  [facility] Progress Note    Subjective:   Feels okay   Objective:   BP 116/63 mmHg  Pulse 68  Temp(Src) 98.6 F (37 C) (Oral)  Resp 18  Wt 72.9 kg (160 lb 11.5 oz)  SpO2 99%  Intake/Output: I/O last 3 completed shifts: In: 258 [Blood:258] Out: 4026 [Other:4026]   Intake/Output this shift:    Weight change:   Physical Exam: QMV:HQIONGEXBMW ill-appearing WM in NAD CVS:no rub Resp:cta Abd:+BS, soft, NT/ND Ext:s/p right AKA, left foot with ischemic/mottled toes, LUE AVF +T/B with some stitches in antecubital area, wrapping in place  Labs: BMET  Recent Labs Lab 11/25/14 1846 11/25/14 2004 11/26/14 0432  NA 133* 133* 137  K 4.8 4.8 3.2*  CL 93* 95* 97*  CO2 GLUCOSE 120* 114* 107*  BUN 67* 67* 30*  CREATININE 6.76* 6.69* 3.59*  ALBUMIN 2.3* 2.2*  --   CALCIUM 8.4* 8.3* 8.1*  PHOS 10.0* 10.1*  --    CBC  Recent Labs Lab 11/25/14 1846 11/25/14 2004 11/26/14 0432  WBC 8.4 8.6 8.2  HGB 8.2* 8.0* 8.0*  HCT 25.6* 25.3* 24.9*  MCV 92.4 92.3 92.6  PLT 100* 105* 94*    @ Medications:    . sodium chloride   Intravenous Once  . sodium chloride   Intravenous Once  . amiodarone  400 mg Oral BID  . calcium acetate  1,334 mg Oral TID WC  . darbepoetin (ARANESP) injection - DIALYSIS  120 mcg Intravenous Q Tue-HD  . feeding supplement (NEPRO CARB STEADY)  237 mL Oral BID BM  . feeding supplement (PRO-STAT SUGAR FREE 64)  30 mL Oral BID  . [START ON 11/27/2014] ferric gluconate (FERRLECIT/NULECIT) IV  62.5 mg Intravenous Weekly  . ferrous sulfate  325 mg Oral Daily  . folic acid  1 mg Oral Daily  . insulin aspart  0-9 Units Subcutaneous TID WC  . [START ON 11/27/2014] midodrine  10 mg Oral Q T,Th,Sa-HD  . multivitamin  1 tablet Oral QHS  . nitroGLYCERIN  0.4 mg Sublingual Once  . pantoprazole  40 mg Oral Daily  . piperacillin-tazobactam (ZOSYN)  IV   2.25 g Intravenous Q8H  . senna-docusate  1 tablet Oral BID  . sodium chloride  3 mL Intravenous Q12H  . Warfarin - Physician Dosing Inpatient   Does not apply q1800    Outpt HD Orders: Unit: [facility]; Days: TTS; Time: 4h; Dialyzer: 180; EDW: 68kg; K/Ca: 2/2.25 Access: TDC, maturing AVF; BFR/DFR: 400/800; UF Proflie: none; VDRA: none EPO: aranesp 120 qWk; IV Fe: venofer 50 qWk; Heparin: 2000 IVB qTx on warfarin also   Assessment/ Plan:   1. PAD s/p R AKA now with drainage/wound dehiscence.  For revision however this is on hold due to supratherapeutic INR.  Pt given vit K and FFP await repeat of PT/INR 2. ESRD; continue HD qTTS 3. Anemia of chronic disease: with ABLA on ACDz s/p AKA.  continue aranesp and follow H/H 4. CKD-MBD: continue with phoslo and follow Ca/Phos/iPTH 5. Nutrition: stable 6. Hypertension: stable 7. CMP/Afib- coumadin on hold and given vit K 8. CAD s/p CABG- asymptomatic 9. Vascular access- he suffered wound dehiscence from his LUE AVF,s/p sutures, VVS following.  [name] 11/26/2014, 9:07 AM

## 2014-11-26 NOTE — Progress Notes (Signed)
Pt returned to room. States even with best results from pain meds he never gets below 6/10. Repositioned often at pt request

## 2014-11-26 NOTE — Op Note (Signed)
[facility] OPERATIVE NOTE   Procedure: Right above knee amputation to high above knee  Surgeon(s): Sherren Kerns, MD  Pre op: non healing right AKA  Postop: same  ASSISTANT: Nurse Anesthesia: General  Specimens: Right leg  PROCEDURE DETAIL: After obtaining informed consent, the patient was taken to the operating room. The patient was placed in supine position the operating room table. After induction of general anesthesia and endotracheal intubation the patient's Foley catheter was placed. Next patient's entire rught lower extremity was prepped and draped in usual sterile fashion. A circumferential incision was made on the left leg just above the knee. The incision was carried down into the sucutaneous tissues down to level the saphenous vein. This was ligated and divided between silk ties. Soft tissues were taken down as well as the muscle and fascia with cautery. The superficial femoral artery and vein were dissected free circumferentially clamped and divided. These were suture ligated proximally. Remainder of the soft tissues were taken down with cautery. The periosteum was raised on the femur approximately 5 cm above the skin edge. The femur was divided at this level. The leg was passed off the table as a specimen. Hemostasis was obtained. The wound was thoroughly irrigated with normal saline solution. The fascial edges were reapproximated using interrupted 2 0 Vicryl sutures. The subcutaneous tissues reapproximated using a running 3-0 Vicryl suture. The skin was closed staples. Patient tolerated procedure well and there were no complications. Instrument sponge and needle counts correct in the case. Patient was taken to recovery in stable condition.  Fabienne Bruns, MD [facility] Office: [phone] Pager: [phone]

## 2014-11-26 NOTE — Transfer of Care (Signed)
Immediate Anesthesia Transfer of Care Note  Patient: [name]  Procedure(s) Performed: Procedure(s): REVISION AMPUTATION ABOVE KNEE (Right)  Patient Location: PACU  Anesthesia Type:General  Level of Consciousness: awake, alert , oriented and patient cooperative  Airway & Oxygen Therapy: Patient Spontanous Breathing  Post-op Assessment: Report given to RN, Post -op Vital signs reviewed and stable and Patient moving all extremities  Post vital signs: Reviewed and stable  Last Vitals:  Filed Vitals:   11/26/14 1045  BP: 143/65  Pulse: 66  Temp: 36.7 C  Resp: 18    Complications: No apparent anesthesia complications

## 2014-11-27 ENCOUNTER — Encounter: Payer: Medicare Other | Admitting: Vascular Surgery

## 2014-11-27 ENCOUNTER — Encounter (HOSPITAL_COMMUNITY): Payer: Self-pay | Admitting: Vascular Surgery

## 2014-11-27 LAB — BASIC METABOLIC PANEL
ANION GAP: 16 — AB (ref 5–15)
BUN: 45 mg/dL — AB (ref 6–20)
CALCIUM: 8.1 mg/dL — AB (ref 8.9–10.3)
CO2: 24 mmol/L (ref 22–32)
CREATININE: 4.71 mg/dL — AB (ref 0.61–1.24)
Chloride: 95 mmol/L — ABNORMAL LOW (ref 101–111)
GFR calc non Af Amer: 13 mL/min — ABNORMAL LOW (ref >60)
GFR, EST AFRICAN AMERICAN: 15 mL/min — AB (ref >60)
Glucose, Bld: 141 mg/dL — ABNORMAL HIGH (ref 65–99)
POTASSIUM: 4.1 mmol/L (ref 3.5–5.1)
Sodium: 135 mmol/L (ref 135–145)

## 2014-11-27 LAB — CBC
HEMATOCRIT: 24.1 % — AB (ref 39.0–52.0)
Hemoglobin: 7.6 g/dL — ABNORMAL LOW (ref 13.0–17.0)
MCH: 29.3 pg (ref 26.0–34.0)
MCHC: 31.5 g/dL (ref 30.0–36.0)
MCV: 93.1 fL (ref 78.0–100.0)
Platelets: 97 10*3/uL — ABNORMAL LOW (ref 150–400)
RBC: 2.59 MIL/uL — ABNORMAL LOW (ref 4.22–5.81)
RDW: 18.6 % — AB (ref 11.5–15.5)
WBC: 8 10*3/uL (ref 4.0–10.5)

## 2014-11-27 LAB — PREPARE FRESH FROZEN PLASMA
UNIT DIVISION: 0
UNIT DIVISION: 0
Unit division: 0
Unit division: 0

## 2014-11-27 LAB — GLUCOSE, CAPILLARY
GLUCOSE-CAPILLARY: 140 mg/dL — AB (ref 65–99)
GLUCOSE-CAPILLARY: 104 mg/dL — AB (ref 65–99)
Glucose-Capillary: 144 mg/dL — ABNORMAL HIGH (ref 65–99)
Glucose-Capillary: 111 mg/dL — ABNORMAL HIGH (ref 65–99)

## 2014-11-27 LAB — PROTIME-INR
INR: 1.4 (ref 0.00–1.49)
Prothrombin Time: 17.2 seconds — ABNORMAL HIGH (ref 11.6–15.2)

## 2014-11-27 MED ORDER — OXYCODONE HCL 5 MG PO TABS
10.0000 mg | ORAL_TABLET | ORAL | Status: DC | PRN
  Administered 2014-11-27 – 2014-11-28 (×5): 10 mg via ORAL
  Filled 2014-11-27 (×5): qty 2

## 2014-11-27 MED ORDER — OXYCODONE HCL ER 15 MG PO T12A
15.0000 mg | EXTENDED_RELEASE_TABLET | Freq: Two times a day (BID) | ORAL | Status: DC
  Administered 2014-11-27 – 2014-11-28 (×2): 15 mg via ORAL
  Filled 2014-11-27 (×3): qty 1

## 2014-11-27 NOTE — Progress Notes (Signed)
Occupational Therapy Evaluation Patient Details Name: [name] MRN: [id number] DOB: November 24, 1965 Today's Date: 11/27/2014    History of Present Illness Pt admitted for revision of R AKA.   Clinical Impression   Evaluation limited due to pain. Pt currently undergoing rehab at [facility]. Pt plans to D/C back to [facility] in order to continue to progress with rehab. Pt well know to this therapist from previous admission. Will follow acutely to facilitate D/C to SNF and maximize functional level of independence with ADL and mobility.    Follow Up Recommendations  SNF;Supervision/Assistance - 24 hour    Equipment Recommendations    defer to SNF   Recommendations for Other Services       Precautions / Restrictions Precautions Precautions: Fall      Mobility Bed Mobility               General bed mobility comments: Pt declined to move OOB due to pain - "I've finally gotten comfortable" "I will try tomorrow if i can"  Transfers                                                                  ADL                                         General ADL Comments: Pt requires Max A for LB ADL and Mod A for UB ADL at this time due to pain primarily.   Pt states he has been completing at w/c level at [facility]     Perception     Praxis      Pertinent Vitals/Pain Pain Assessment: Faces Pain Score: 5  Pain Location: r LE Pain Descriptors / Indicators: Aching Pain Intervention(s): Limited activity within patient's tolerance     Hand Dominance     Extremity/Trunk Assessment Upper Extremity Assessment Upper Extremity Assessment: Generalized weakness   Lower Extremity Assessment Lower Extremity Assessment: Defer to PT evaluation RLE Deficits / Details: high R AKA       Communication     Cognition Arousal/Alertness: Awake/alert Behavior During Therapy: Flat affect Overall Cognitive Status: No  family/caregiver present to determine baseline cognitive functioning                 General Comments: appears at basleine   General Comments       Exercises       Shoulder Instructions      Home Living Family/patient expects to be discharged to:: Skilled nursing facility                                        Prior Functioning/Environment Level of Independence: Needs assistance        Comments: Has been at [facility] for rehab. States hecould transfer to w/c with someone standing by and thath he could bath self and that it took "forever" to dress himself    OT Diagnosis: Generalized weakness;Acute pain   OT Problem List: Decreased strength;Decreased activity tolerance;Impaired balance (sitting and/or standing);Decreased safety awareness;Pain;Decreased knowledge  of use of DME or AE   OT Treatment/Interventions: Self-care/ADL training;Therapeutic exercise;Energy conservation;DME and/or AE instruction;Therapeutic activities;Patient/family education;Balance training    OT Goals(Current goals can be found in the care plan section) Acute Rehab OT Goals Patient Stated Goal: to return to rehab OT Goal Formulation: With patient Time For Goal Achievement: 12/11/14 Potential to Achieve Goals: Good  OT Frequency: Min 2X/week   Barriers to D/C:            Co-evaluation              End of Session Nurse Communication: Mobility status  Activity Tolerance: Patient limited by pain Patient left: in bed;with call bell/phone within reach   Time: 1610-9604 OT Time Calculation (min): 17 min Charges:  OT General Charges $OT Visit: 1 Procedure OT Evaluation $Initial OT Evaluation Tier I: 1 Procedure G-Codes:    Tilden Broz,HILLARY 15-Dec-2014, 3:47 PM   [facility], OTR/L  [phone] December 15, 2014

## 2014-11-27 NOTE — Progress Notes (Addendum)
Initial Nutrition Assessment  DOCUMENTATION CODES:   Severe malnutrition in context of chronic illness  INTERVENTION:   Continue Nepro Shake po BID, each supplement provides 425 kcal and 19 grams protein  Continue Prostat liquid protein po 30 ml BID with meals, each supplement provides 100 kcal, 15 grams protein  NUTRITION DIAGNOSIS:   Increased nutrient needs related to chronic illness, wound healing as evidenced by estimated needs  GOAL:   Patient will meet greater than or equal to 90% of their needs  MONITOR:   PO intake, Supplement acceptance, Labs, Weight trends, I & O's  REASON FOR ASSESSMENT:   Malnutrition Screening Tool  ASSESSMENT:   49 y.o. Male with ESRD; admitted from vascular surgery clinic with nonhealing right AKA stump.   Patient s/p procedure 8/3: RIGHT ABOVE KNEE AMPUTATION -- to high above knee  RD unable to obtain nutrition hx at time of visit.  Per RN, pt sleeping.  Pt followed per Clinical Nutrition during previous admission in June 2016.  Malnutrition identified and ongoing.  Would benefit from addition of oral nutrition supplements -- orders in place.  Nutrition-Focused physical exam completed 09/2014.  Findings were severe fat depletion and severe muscle depletion.  Diet Order:  Diet renal with fluid restriction Fluid restriction:: 1200 mL Fluid; Room service appropriate?: Yes; Fluid consistency:: Thin  Skin:  Unstageable pressure ulcer on sacrum, incision on R thigh and L arm Last BM:  8/3  Height:   Ht Readings from Last 1 Encounters:  11/25/14  (1.803 m)    Weight:   Wt Readings from Last 1 Encounters:  11/25/14 160 lb 11.5 oz (72.9 kg)    Ideal Body Weight:  78 kg  BMI:  Body mass index is 22.43 kg/(m^2).  Estimated Nutritional Needs:   Kcal:  2100-2300  Protein:  105-115 gm   Fluid:  1.2 L  EDUCATION NEEDS:   No education needs identified at this time  Maureen Chatters, RD, LDN Pager #: [phone] After-Hours  Pager #: [phone]

## 2014-11-27 NOTE — Clinical Social Work Note (Signed)
Clinical Social Work Assessment  Patient Details  Name: [name] MRN: [id number] Date of Birth: [birth year]  Date of referral:  11/27/14               Reason for consult:  Facility Placement                Permission sought to share information with:  Oceanographer granted to share information::  Yes, Verbal Permission Granted  Name::        Agency::  [facility]  Relationship::     Contact Information:     Housing/Transportation Living arrangements for the past 2 months:  Skilled Building surveyor of Information:  Patient Patient Interpreter Needed:  None Criminal Activity/Legal Involvement Pertinent to Current Situation/Hospitalization:  No - Comment as needed Significant Relationships:  Siblings, Parents Lives with:  Self (currently getting rehab at SNF) Do you feel safe going back to the place where you live?  Yes Need for family participation in patient care:  No (Coment)  Care giving concerns:  Pt does not have enough home support given current physical needs   Office manager / plan:  CSW spoke with pt concerning return to SNF at time of DC  Employment status:  Retired Health and safety inspector:  Armed forces operational officer, Medicaid In [place] PT Recommendations:  Skilled Nursing Facility Information / Referral to community resources:  Skilled Nursing Facility  Patient/Family's Response to care:  Pt is agreeable to returning to [place] and continuing rehab  Patient/Family's Understanding of and Emotional Response to Diagnosis, Current Treatment, and Prognosis:  Pt had no questions or concerns- was very lethargic during interview and not very responsive  Emotional Assessment Appearance:  Appears older than stated age Attitude/Demeanor/Rapport:  Lethargic Affect (typically observed):  Appropriate, Accepting Orientation:  Oriented to Self, Oriented to Place, Oriented to  Time, Oriented to Situation Alcohol / Substance use:  Not Applicable Psych  involvement (Current and /or in the community):  No (Comment)  Discharge Needs  Concerns to be addressed:  Care Coordination Readmission within the last 30 days:  No Current discharge risk:  Physical Impairment Barriers to Discharge:  Continued Medical Work up   [employer], LCSW 11/27/2014, 4:06 PM

## 2014-11-27 NOTE — Progress Notes (Signed)
Utilization review completed.  

## 2014-11-27 NOTE — Clinical Documentation Improvement (Signed)
Dr. Darrick Penna, Please document the correct extremity in the body of OR  REPORT. One statement with right lower extremity and the next statement  with left leg documented.  NOTED in report:"Next patient's entire rught lower extremity was prepped and draped in usual sterile fashion. A circumferential incision was made on the left leg just above the knee. "   Thank you,  Andy Gauss ,RN Clinical Documentation Specialist:  [phone]  [facility]- Health Information Management

## 2014-11-27 NOTE — Progress Notes (Signed)
Patient ID: [name], male   DOB: 01-11-1966, 49 y.o.   MRN: [id number]  [facility] Progress Note    Subjective:   Complaining of pain in right stump   Objective:   BP 110/56 mmHg  Pulse 75  Temp(Src) 98.5 F (36.9 C) (Oral)  Resp 20  Wt 72.9 kg (160 lb 11.5 oz)  SpO2 98%  Intake/Output: I/O last 3 completed shifts: In: 1044 [P.O.:444; I.V.:300; Blood:300] Out: 4327 [Urine:200; JXBJY:7829; Stool:1; Blood:100]   Intake/Output this shift:    Weight change:   Physical Exam: FAO:ZHYQ chronically ill-appearing WM CVS:no rub Resp:cta MVH:QIONGE Ext:s/p revision of R AKA, left leg with 1+ edema, and ulcerations and ischemic 3rd toe, LUE AVF +T/B, sutures in place from dehiscence  Labs: BMET  Recent Labs Lab 11/25/14 1846 11/25/14 2004 11/26/14 0432 11/26/14 1612 11/27/14 0417  NA 133* 133* 137 137 135  K 4.8 4.8 3.2* 3.4* 4.1  CL 93* 95* 97* 97* 95*  CO2 22 22 28 25 24   GLUCOSE 120* 114* 107* 109* 141*  BUN 67* 67* 30* 36* 45*  CREATININE 6.76* 6.69* 3.59* 4.19*  4.25* 4.71*  ALBUMIN 2.3* 2.2*  --  2.4*  --   CALCIUM 8.4* 8.3* 8.1* 8.2* 8.1*  PHOS 10.0* 10.1*  --  7.5*  --    CBC  Recent Labs Lab 11/25/14 2004 11/26/14 0432 11/26/14 1612 11/27/14 0417  WBC 8.6 8.2 7.5 8.0  HGB 8.0* 8.0* 7.4* 7.6*  HCT 25.3* 24.9* 23.1* 24.1*  MCV 92.3 92.6 93.5 93.1  PLT 105* 94* 91* 97*    @IMGRELPRIORS @ Medications:    . sodium chloride   Intravenous Once  . sodium chloride   Intravenous Once  . amiodarone  400 mg Oral BID  . calcium acetate  1,334 mg Oral TID WC  . darbepoetin (ARANESP) injection - DIALYSIS  120 mcg Intravenous Q Tue-HD  . feeding supplement (ENSURE ENLIVE)  237 mL Oral TID BM  . feeding supplement (NEPRO CARB STEADY)  237 mL Oral BID BM  . feeding supplement (PRO-STAT SUGAR FREE 64)  30 mL Oral BID  . ferric gluconate (FERRLECIT/NULECIT) IV  62.5 mg Intravenous Weekly  . ferrous sulfate  325 mg Oral Daily  . folic acid   1 mg Oral Daily  . heparin  5,000 Units Subcutaneous 3 times per day  . midodrine  10 mg Oral Q T,Th,Sa-HD  . multivitamin  1 tablet Oral QHS  . nitroGLYCERIN  0.4 mg Sublingual Once  . pantoprazole  40 mg Oral Daily  . piperacillin-tazobactam (ZOSYN)  IV  2.25 g Intravenous Q8H  . senna-docusate  1 tablet Oral BID  . sodium chloride  3 mL Intravenous Q12H    Outpt HD Orders: Unit: [facility]; Days: TTS; Time: 4h; Dialyzer: 180; EDW: 68kg; K/Ca: 2/2.25 Access: TDC, maturing AVF; BFR/DFR: 400/800; UF Proflie: none; VDRA: none EPO: aranesp 120 qWk; IV Fe: venofer 50 qWk; Heparin: 2000 IVB qTx on warfarin also  Assessment/ Plan:   1. PAD s/p R AKA now with drainage/wound dehiscence. s/p revision of AKA.  (Pt given vit K and FFP due to elevated PT/INR) 2. ESRD; continue HD qTTS 3. Anemia of chronic disease: with ABLA on ACDz s/p AKA. continue aranesp and follow H/H 4. CKD-MBD: continue with phoslo and follow Ca/Phos/iPTH 5. Nutrition: stable 6. Hypertension: stable 7. CMP/Afib- coumadin on hold and given vit K 8. CAD s/p CABG- asymptomatic 9. Vascular access- he suffered wound dehiscence from his LUE AVF,s/p  sutures, VVS following.  Midas Daughety A 11/27/2014, 10:04 AM

## 2014-11-27 NOTE — Progress Notes (Signed)
[facility]  Subjective  - pain   Objective 110/56 75 98.5 F (36.9 C) (Oral) 20 98%  Intake/Output Summary (Last 24 hours) at 11/27/14 0901 Last data filed at 11/27/14 0300  Gross per 24 hour  Intake    786 ml  Output    100 ml  Net    686 ml   Right AKA dressing intact pain at incision Left leg wounds clean  Assessment/Planning: Increase oxycodone to 10 mg Pt does not like feeling IV pain meds give him Push nutrition HD today Change dressing tomorrow Severe protein cal malnutrition impeding wound healing discussed with pt and family importance of eating and protein supplements Anemia transfuse prn per nephrology if indicated  Fabienne Bruns 11/27/2014 9:01 AM --  Laboratory Lab Results:  Recent Labs  11/26/14 1612 11/27/14 0417  WBC 7.5 8.0  HGB 7.4* 7.6*  HCT 23.1* 24.1*  PLT 91* 97*   BMET  Recent Labs  11/26/14 1612 11/27/14 0417  NA 137 135  K 3.4* 4.1  CL 97* 95*  CO2 25 24  GLUCOSE 109* 141*  BUN 36* 45*  CREATININE 4.19*  4.25* 4.71*  CALCIUM 8.2* 8.1*    COAG Lab Results  Component Value Date   INR 1.40 11/27/2014   INR 2.03* 11/26/2014   INR 2.93* 11/25/2014   No results found for: PTT

## 2014-11-27 NOTE — Progress Notes (Signed)
PT Cancellation Note  Patient Details Name: [name] MRN: [id number] DOB: 01/28/1966   Cancelled Treatment:    Reason Eval/Treat Not Completed: Patient at procedure or test/unavailable.  RN and tech there to take pt to HD.  PT will check back tomorrow. Thanks,    Rollene Rotunda. Leotha Westermeyer, PT, DPT [phone]   11/27/2014, 4:02 PM

## 2014-11-28 ENCOUNTER — Inpatient Hospital Stay (HOSPITAL_COMMUNITY): Payer: Medicare Other

## 2014-11-28 ENCOUNTER — Telehealth: Payer: Self-pay | Admitting: Vascular Surgery

## 2014-11-28 ENCOUNTER — Encounter (HOSPITAL_COMMUNITY): Payer: Medicare Other

## 2014-11-28 DIAGNOSIS — N186 End stage renal disease: Secondary | ICD-10-CM

## 2014-11-28 LAB — BASIC METABOLIC PANEL
Anion gap: 14 (ref 5–15)
BUN: 23 mg/dL — AB (ref 6–20)
CHLORIDE: 96 mmol/L — AB (ref 101–111)
CO2: 27 mmol/L (ref 22–32)
Calcium: 8.3 mg/dL — ABNORMAL LOW (ref 8.9–10.3)
Creatinine, Ser: 2.92 mg/dL — ABNORMAL HIGH (ref 0.61–1.24)
GFR calc Af Amer: 27 mL/min — ABNORMAL LOW (ref >60)
GFR calc non Af Amer: 24 mL/min — ABNORMAL LOW (ref >60)
Glucose, Bld: 156 mg/dL — ABNORMAL HIGH (ref 65–99)
Potassium: 3.8 mmol/L (ref 3.5–5.1)
Sodium: 137 mmol/L (ref 135–145)

## 2014-11-28 LAB — GLUCOSE, CAPILLARY
GLUCOSE-CAPILLARY: 140 mg/dL — AB (ref 65–99)
Glucose-Capillary: 152 mg/dL — ABNORMAL HIGH (ref 65–99)

## 2014-11-28 LAB — CBC
HCT: 22.8 % — ABNORMAL LOW (ref 39.0–52.0)
HEMOGLOBIN: 7.2 g/dL — AB (ref 13.0–17.0)
MCH: 29.8 pg (ref 26.0–34.0)
MCHC: 31.6 g/dL (ref 30.0–36.0)
MCV: 94.2 fL (ref 78.0–100.0)
Platelets: 116 10*3/uL — ABNORMAL LOW (ref 150–400)
RBC: 2.42 MIL/uL — ABNORMAL LOW (ref 4.22–5.81)
RDW: 18.4 % — ABNORMAL HIGH (ref 11.5–15.5)
WBC: 9.9 10*3/uL (ref 4.0–10.5)

## 2014-11-28 LAB — PROTIME-INR
INR: 1.47 (ref 0.00–1.49)
PROTHROMBIN TIME: 17.9 s — AB (ref 11.6–15.2)

## 2014-11-28 MED ORDER — OXYCODONE HCL 10 MG PO TABS: ORAL_TABLET | ORAL | Status: DC

## 2014-11-28 NOTE — Progress Notes (Addendum)
VASCULAR LAB PRELIMINARY  PRELIMINARY  PRELIMINARY  PRELIMINARY  Left upper ext. limited Duplex completed. Negative for pseudoaneurysm in the left upper arm. A non vasculaized cystic structure was noted.  Chauncy Lean, RVT 11/28/2014, 12:13 PM

## 2014-11-28 NOTE — Progress Notes (Signed)
Ok per MD for d/c today back to [facility] for continued care. CSW spoke to Admissions at [facility]- bed remains available for patient.  Message left for patient's mother Deloris Clearance Coots re: d/c and patient is agreeable to return to SNF. EMS transport arranged; nursing notified to call report to SNF. No further CSW needs identified.  CSW signing off.  Lorri Frederick. Jaci Lazier, [place] [phone]

## 2014-11-28 NOTE — Progress Notes (Signed)
11/28/2014 4:44 PM Pt discharged to [name] place via [facility]. [name]

## 2014-11-28 NOTE — Progress Notes (Signed)
Patient ID: [name], male   DOB: 26-Oct-1965, 49 y.o.   MRN: [id number]  [name] Progress Note    Subjective:   Pain is improved some Had HD yesterday via TDC no issues noted To have Duplex of his AVF today to see if pseudoaneurysm vs seroma   Objective:   BP 133/59 mmHg  Pulse 80  Temp(Src) 98.5 F (36.9 C) (Oral)  Resp 18  Wt 57.743 kg (127 lb 4.8 oz)  SpO2 98%  Physical Exam: PIR:JJOA chronically ill-appearing WM Frail, thin but pleasant Covered in tattos and skin tears TDC clean and dry CVS:S1S2 No S3 Resp:Clear ant, deminshed BS right base CZY:SAYT and non-tender KZS:WFUX R AKA stump wrapped, left leg with 1+ edema, and ulcerations and ischemic 3rd toe LUE AVF +T/B, sutures in place from dehiscence with ? pseudoaneurysm vs seroma (looks like seroma to me - firm and not pulsatile).   Labs: BMET  Recent Labs Lab 11/25/14 1846 11/25/14 2004 11/26/14 0432 11/26/14 1612 11/27/14 0417 11/28/14 0343  NA 133* 133* 137 137 135 137  K 4.8 4.8 3.2* 3.4* 4.1 3.8  CL 93* 95* 97* 97* 95* 96*  CO2 GLUCOSE 120* 114* 107* 109* 141* 156*  BUN 67* 67* 30* 36* 45* 23*  CREATININE 6.76* 6.69* 3.59* 4.19*  4.25* 4.71* 2.92*  ALBUMIN 2.3* 2.2*  --  2.4*  --   --   CALCIUM 8.4* 8.3* 8.1* 8.2* 8.1* 8.3*  PHOS 10.0* 10.1*  --  7.5*  --   --    CBC  Recent Labs Lab 11/26/14 0432 11/26/14 1612 11/27/14 0417 11/28/14 0343  WBC 8.2 7.5 8.0 9.9  HGB 8.0* 7.4* 7.6* 7.2*  HCT 24.9* 23.1* 24.1* 22.8*  MCV 92.6 93.5 93.1 94.2  PLT 94* 91* 97* 116*     Medications:    . sodium chloride   Intravenous Once  . sodium chloride   Intravenous Once  . amiodarone  400 mg Oral BID  . calcium acetate  1,334 mg Oral TID WC  . darbepoetin (ARANESP) injection - DIALYSIS  120 mcg Intravenous Q Tue-HD  . feeding supplement (ENSURE ENLIVE)  237 mL Oral TID BM  . feeding supplement (NEPRO CARB STEADY)  237 mL Oral BID BM  . feeding supplement  (PRO-STAT SUGAR FREE 64)  30 mL Oral BID  . ferric gluconate (FERRLECIT/NULECIT) IV  62.5 mg Intravenous Weekly  . ferrous sulfate  325 mg Oral Daily  . folic acid  1 mg Oral Daily  . heparin  5,000 Units Subcutaneous 3 times per day  . midodrine  10 mg Oral Q T,Th,Sa-HD  . multivitamin  1 tablet Oral QHS  . nitroGLYCERIN  0.4 mg Sublingual Once  . OxyCODONE  15 mg Oral Q12H  . pantoprazole  40 mg Oral Daily  . senna-docusate  1 tablet Oral BID  . sodium chloride  3 mL Intravenous Q12H    Outpt HD Orders: Unit: [facility]; Days: TTS; Time: 4h; Dialyzer: 180; EDW: 68kg; K/Ca: 2/2.25 Access: TDC, maturing AVF; BFR/DFR: 400/800; UF Proflie: none; VDRA: none EPO: aranesp 120 qWk; IV Fe: venofer 50 qWk; Heparin: 2000 IVB qTx on warfarin also  Assessment/ Plan:   1. PAD s/p R AKA 6/13 with drainage/wound dehiscence==>s/p revision of AKA to high above knee 8/3.  (Pt given vit K and FFP due to elevated PT/INR). VVS note indicates viable stump 2. ESRD. Continue HD qTTS 3. Anemia of chronic disease:  with ABLA on ACDz s/p AKA. continue Aranesp and follow H/H. Gets weekly IV Fe at HD. Does not need po iron. 4. CKD-MBD: continue with phoslo and follow Ca/Phos/iPTH 5. Nutrition: stable 6. Hypertension: stable 7. CMP/Afib- coumadin appears to still be on hold ? What is plan for restarting prior to d/c? 8. CAD s/p CABG- asymptomatic 9. Vascular access- he suffered wound dehiscence from his LUE AVF,s/p sutures. To have duplex today. Looks like seroma to me. 10. Disposition - appears if AVF has no pseudoaneurysm he may go back to SNF today. HD orders written for tomorrow just in case does not leave.  [name], MD [facility] [phone] Pager 11/28/2014, 11:42 AM

## 2014-11-28 NOTE — Telephone Encounter (Addendum)
-----   Message from Phillips Odor, RN sent at 11/26/2014  4:13 PM EDT ----- Regarding: Joyce Gross log; also needs 4 wk f/u with [facility]   ----- Message -----    From: Dara Lords, PA-C    Sent: 11/26/2014  12:45 PM      To: Vvs Charge Pool  S/p right AKA 11/26/14.  F/u with Dr.  Darrick Penna in 4 weeks.  Thanks, Lelon Mast  notified [facility] of post op appt on 12-25-14 at 8:45 with dr. Darrick Penna

## 2014-11-28 NOTE — Discharge Summary (Signed)
[facility] Discharge Summary  [name] 17-Jun-1965 49 y.o. 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 male s/p right AKA on 10/06/14 by Dr. Darrick Penna. He dialyzes T/T/S and has not had HD today. He currently dialyzes via a diatek catheter, which was placed by Dr. Arbie Cookey on 10/01/14. On 11/06/14, the antecubital incision was noted to be well healed, however, this had opened after transfer. He did receive 2 simple stitches at close the skin edges.   He presents today with open wound on his right AKA stump with drainage. He has not had any fevers. He states that sometimes his wounds get packed and sometimes they don't. He is not on any antibiotics that he knows of.   He is on coumadin for Afib.  No Known Allergies  Hospital Course:  The patient was admitted to the hospital and taken to the operating room on 11/26/2014 and underwent: Right above knee amputation to high above knee    The patient tolerated the procedure well and was transported to the PACU in stable condition. Nephrology was consulted for HD management.   POD 1: He had acute blood loss anemia with Hgb 7.6. He was asymptomatic and monitored. Nutrition was emphasized for wound healing. His left leg wounds were clean. His left upper arm fistula sutures in place from prior dehiscence.  POD 2: His dressing was taken down and staple line was clean and intact. His stump was viable. His left leg wounds continued to be clean. His left upper arm fistula had a new enlarged bulge at the antecubital space (same location as AV anastomosis). This was concerning for possible pseudoaneurysm versus seroma. A duplex was ordered.   Duplex completed. It is negative for pseudoaneurysm with a non vascularized cystic structure noted. Will  observe.  Resume coumadin today.  PT will be discharged today after pt is seen by Dr. Darrick Penna.      CBC    Component Value Date/Time   WBC 9.9 11/28/2014 0343   RBC 2.42* 11/28/2014 0343   HGB 7.2* 11/28/2014 0343   HCT 22.8* 11/28/2014 0343   PLT 116* 11/28/2014 0343   MCV 94.2 11/28/2014 0343   MCH 29.8 11/28/2014 0343   MCHC 31.6 11/28/2014 0343   RDW 18.4* 11/28/2014 0343   LYMPHSABS 0.4* 10/08/2014 0901   MONOABS 0.7 10/08/2014 0901   EOSABS 0.1 10/08/2014 0901   BASOSABS 0.0 10/08/2014 0901    BMET    Component Value Date/Time   NA 137 11/28/2014 0343   K 3.8 11/28/2014 0343   CL 96* 11/28/2014 0343   CO2 27 11/28/2014 0343   GLUCOSE 156* 11/28/2014 0343   BUN 23* 11/28/2014 0343   CREATININE 2.92* 11/28/2014 0343   CALCIUM 8.3* 11/28/2014 0343   GFRNONAA 24* 11/28/2014 0343   GFRAA 27* 11/28/2014 0343     Discharge Instructions:   The patient is discharged to SNF with extensive instructions on wound care and progressive ambulation.  They are instructed not to drive or perform any heavy lifting until returning to see the physician in his office.  Discharge Instructions    Call MD for:  redness, tenderness, or signs of infection (pain, swelling, bleeding, redness, odor or green/yellow discharge around incision site)    Complete by:  As directed  Call MD for:  severe or increased pain, loss or decreased feeling  in affected limb(s)    Complete by:  As directed      Call MD for:  temperature >100.5    Complete by:  As directed      Discharge wound care:    Complete by:  As directed   Wash right amputation site daily with soap and water and pat dry. Wrap with kerlix and ACE.  Wet to dry NS dressing to left leg twice daily. Wrap with kerlix. Lukewarm dial soap soaks to left foot daily.     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 Non-healing amputation site 11/25/2014  . New onset a-fib 10/22/2014  . ESRD on dialysis 10/22/2014  . Enteritis due to Clostridium difficile 10/22/2014  . Stage 3 skin ulcer of sacral region 10/22/2014  . Protein-calorie malnutrition 10/22/2014  . Hemodialysis-associated hypotension 10/22/2014  . 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 Pressure ulcer 10/03/2014  . Gangrene of foot   . Sepsis affecting skin   . Protein-calorie malnutrition, severe 09/29/2014  . Chronic systolic CHF (congestive heart failure)   . Diabetes type 2, controlled   . Systolic CHF, chronic   . Metabolic encephalopathy   . Cellulitis of leg, right   . Cellulitis of leg, left   . Chronic systolic heart failure   . Cardiomyopathy   . Hypokalemia   . Blood poisoning   . Bilateral lower leg cellulitis   . Jaundice   . Peripheral artery disease   . CAD in native artery   . Essential hypertension   . HLD (hyperlipidemia)   . Diabetes type 2, uncontrolled   . Acute on chronic renal failure   . Chronic combined systolic and diastolic CHF (congestive heart failure)   . Combined systolic and diastolic congestive heart failure 09/24/2014  . Cellulitis of right leg 09/24/2014  . Sepsis 09/24/2014  . Cellulitis 09/24/2014  . Constipation 09/24/2014  . GERD (gastroesophageal reflux disease)   . Gastroesophageal reflux disease with esophagitis   . Type 2 diabetes mellitus with complication   . Acute on chronic combined systolic and diastolic congestive heart failure, NYHA class 4 Mar 12, 2014  . At risk for sudden cardiac death March 12, 2014  .  PAD (peripheral artery disease) 11/20/2012  . Diastolic dysfunction, Grade 3 16/01/9603  . Thrombocytopenia-chronic 03/22/2011  . 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 HTN (hypertension) 03/19/2011  . Hyperlipidemia 03/19/2011  . DM2 (diabetes mellitus, type 2) 03/19/2011  . CHF (congestive heart failure): Systolic/ diastolic 03/19/2011  . Acute renal failure superimposed on stage 4 chronic kidney disease 03/19/2011  . Tobacco abuse 03/19/2011  . Marijuana abuse 03/19/2011  . Ischemic  dilated cardiomyopathy 03/19/2011  . CAD (coronary artery disease) 03/19/2011  . NSTEMI (non-ST elevated myocardial infarction) 03/19/2011   Past Medical History  Diagnosis Date  . Coronary artery disease 07/05/2011    2D ECHO - EF ~40%, moderate concentric LV hypertrophy, LA moderately dilated, mild-moderate septal and inferior wall hypokinesis  . Hypertension   . CHF (congestive heart failure)   . Diabetes mellitus     Insulin dependent  . Cardiomyopathy   . Claudication 05/09/2011    Right and left anterior tibial arteries and Left SFA-occluded; Right CIA-<50% diameter reduction; Right Deep Profunda-70-99% diameter reduction; Right SFA->60% diameter reduction; Right Distal Popliteal/Tibial Artery- >60% diameter reduction; Left CFA and Profunda- >50% diameter reduction  . 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 GERD (gastroesophageal reflux disease)   . Peripheral vascular disease   . Chronic kidney disease (CKD), stage IV (severe)   . S/P AKA (above knee amputation) 11/26/2014    REVISION OF AMPUTATION      Medication List    STOP taking these medications        darbepoetin 100 MCG/0.5ML Soln injection    Commonly known as:  ARANESP     vancomycin 125 MG capsule  Commonly known as:  VANCOCIN HCL      TAKE these medications        amiodarone 400 MG tablet  Commonly known as:  PACERONE  Take 1 tablet (400 mg total) by mouth 2 (two) times daily.     calcium acetate 667 MG capsule  Commonly known as:  PHOSLO  Take 2 capsules (1,334 mg total) by mouth 3 (three) times daily with meals.     collagenase ointment  Commonly known as:  SANTYL  Apply topically daily.     Darbepoetin Alfa 100 MCG/0.5ML Sosy injection  Commonly known as:  ARANESP  Inject 0.5 mLs (100 mcg total) into the vein every Tuesday with hemodialysis.     DECUBI-VITE PO  Take 1 tablet by mouth daily.     feeding supplement (NEPRO CARB STEADY) Liqd  Take 237 mLs by mouth 2 (two) times daily between meals.     feeding supplement (PRO-STAT SUGAR FREE 64) Liqd  Take 30 mLs by mouth 2 (two) times daily.     folic acid 1 MG tablet  Commonly known as:  FOLVITE  Take 1 tablet (1 mg total) by mouth daily.     insulin aspart 100 UNIT/ML injection  Commonly known as:  novoLOG  Inject 0-9 Units into the skin 3 (three) times daily with meals. Before each meal 3 times a day, 140-199 - 2 units, 200-250 - 4 units, 251-299 - 6 units,  300-349 - 8 units,  350 or above 10 units. Insulin PEN if approved, provide syringes and needles if needed.     Iron 325 (65 FE) MG Tabs  Take 325 mg by mouth daily.     Melatonin 3 MG Tabs  Take 3 mg by mouth at bedtime.     midodrine 10 MG tablet  Commonly known as:  PROAMATINE  Take 1 tablet (10 mg total) by mouth Every Tuesday,Thursday,and Saturday with dialysis.     multivitamin Tabs tablet  Take 1 tablet by mouth at bedtime.     nitroGLYCERIN 0.4 MG SL tablet  Commonly known as:  NITROSTAT  Place 1 tablet (0.4 mg total) under the tongue once.     OxyCODONE 15 mg T12a 12 hr tablet  Commonly known as:  OXYCONTIN  Take 15 mg by mouth every 12 (twelve) hours.     Oxycodone HCl 10  MG Tabs  Take one tablet by mouth every 4 hours as needed for breakthrough pain     pantoprazole 40 MG tablet  Commonly known as:  PROTONIX  Take 40 mg by mouth daily.     polyethylene glycol packet  Commonly known as:  MIRALAX / GLYCOLAX  Take 17 g by mouth daily as needed for moderate constipation.     senna-docusate 8.6-50 MG per tablet  Commonly known as:  Senokot-S  Take 1 tablet by mouth 2 (two) times daily.     warfarin 1 MG tablet  Commonly known as:  COUMADIN  Take 0.5-1 mg by mouth daily at 6 PM. Take 1 mg on Mon-Wed-Fri-Sat and 0.5 mg on Tues-Thurs-Sun          Oxycodone #30 No Refill  Disposition: SNF  Patient's condition: is Good  Follow up: 1. Dr. Darrick Penna in 4 weeks   Maris Berger, PA-C [facility] [phone] 11/28/2014  11:28 AM

## 2014-11-28 NOTE — Progress Notes (Signed)
PT Cancellation Note  Patient Details Name: Hargis Vandyne MRN: [id number] DOB: 1966/01/13   Cancelled Treatment:    Reason Eval/Treat Not Completed: Other (comment).  Arrived for PT evaluation.  Patient had been d/c'ed back to SNF.   Vena Austria 11/28/2014, 5:27 PM Durenda Hurt. Renaldo Fiddler, [facility] Pager [phone]

## 2014-11-28 NOTE — Progress Notes (Signed)
Duplex completed.  It is negative for pseudoaneurysm with a non vascularized cystic structure noted.  Will observe.  Resume coumadin today.  PT will be discharged today after pt is seen by Dr. Darrick Penna.   Doreatha Massed 11/28/2014 12:20 PM

## 2014-11-28 NOTE — Progress Notes (Signed)
Medications on discharge summary updated.  Spoke with Coralee [place] on [address].  She states pt has already discharged back to facility.  I have asked her to print new dc summary and fax to facility and let facility know this has been updated.  She states this will get done.  Doreatha Massed 11/28/2014 5:14 PM

## 2014-11-28 NOTE — Progress Notes (Addendum)
  [facility] Progress Note  Subjective  - POD #2  Pain somewhat improved.   Objective Filed Vitals:   11/28/14 0407  BP: 133/59  Pulse: 80  Temp: 98.5 F (36.9 C)  Resp: 18    Intake/Output Summary (Last 24 hours) at 11/28/14 0842 Last data filed at 11/27/14 2126  Gross per 24 hour  Intake    360 ml  Output      1 ml  Net    359 ml   Right AKA incision intact. Some dried blood at incision. No active bleeding. Skin edges viable. Mild edema.  Left shin ulcer clean. Left foot wounds clean Left upper arm incision with 3 x 3 cm bulge at antecubital space. Palpable thrill and bruit.   Assessment/Planning: 49 y.o. male is s/p: Right above knee amputation to high above knee 2 Days Post-Op   Stump is viable. Left upper arm fistula with enlarging bulge. Suspect pseudoaneurysm versus seroma. Will obtain duplex this am. If psuedoaneurysm, will require ligation of fistula.  Anemia: Hgb 7.2 today. Was 8.0 preop. Asymptomatic. Monitor.  Ok to d/c to SNF today if duplex shows no pseudoaneurysm  [name] 11/28/2014 8:42 AM --  Laboratory CBC    Component Value Date/Time   WBC 9.9 11/28/2014 0343   HGB 7.2* 11/28/2014 0343   HCT 22.8* 11/28/2014 0343   PLT 116* 11/28/2014 0343    BMET    Component Value Date/Time   NA 137 11/28/2014 0343   K 3.8 11/28/2014 0343   CL 96* 11/28/2014 0343   CO2 27 11/28/2014 0343   GLUCOSE 156* 11/28/2014 0343   BUN 23* 11/28/2014 0343   CREATININE 2.92* 11/28/2014 0343   CALCIUM 8.3* 11/28/2014 0343   GFRNONAA 24* 11/28/2014 0343   GFRAA 27* 11/28/2014 0343    COAG Lab Results  Component Value Date   INR 1.47 11/28/2014   INR 1.40 11/27/2014   INR 2.03* 11/26/2014   No results found for: PTT  Antibiotics Anti-infectives    Start     Dose/Rate Route Frequency Ordered Stop   11/26/14 0000  vancomycin (VANCOCIN) IVPB 750 mg/150 ml premix  Status:  Discontinued     750 mg 150 mL/hr over 60 Minutes  Intravenous  Once 11/25/14 2357 11/26/14 0121   11/25/14 2000  piperacillin-tazobactam (ZOSYN) IVPB 2.25 g  Status:  Discontinued     2.25 g 100 mL/hr over 30 Minutes Intravenous Every 8 hours 11/25/14 1839 11/27/14 1105   11/25/14 1930  vancomycin (VANCOCIN) 1,750 mg in sodium chloride 0.9 % 500 mL IVPB     1,750 mg 250 mL/hr over 120 Minutes Intravenous  Once 11/25/14 1839 11/25/14 2328   11/25/14 1430  vancomycin (VANCOCIN) 125 MG capsule 125 mg  Status:  Discontinued     125 mg Oral 4 times daily 11/25/14 1429 11/26/14 0847       [name] Berger, PA-C [facility] Office: [phone] Pager: [phone] 11/28/2014 8:42 AM   Right AKA healing so far Pt thinks his pain is controlled to return to SNF New mass over proximal left arm AVF is seroma not pseudoaneurysm on [place] eval Will d/c to SNF  Fabienne Bruns, MD [facility] [place] Office: [phone] Pager: [phone]

## 2014-11-28 NOTE — Care Management Important Message (Signed)
Important Message  Patient Details  Name: [name] MRN: [id number] Date of Birth: 06-16-1965   Medicare Important Message Given:  Yes-second notification given    Yvonna Alanis 11/28/2014, 11:18 AM

## 2014-12-01 ENCOUNTER — Encounter: Payer: Self-pay | Admitting: Adult Health

## 2014-12-01 ENCOUNTER — Non-Acute Institutional Stay (SKILLED_NURSING_FACILITY): Payer: Medicare Other | Admitting: Adult Health

## 2014-12-01 ENCOUNTER — Other Ambulatory Visit: Payer: Self-pay | Admitting: *Deleted

## 2014-12-01 DIAGNOSIS — Z992 Dependence on renal dialysis: Secondary | ICD-10-CM | POA: Diagnosis not present

## 2014-12-01 DIAGNOSIS — E43 Unspecified severe protein-calorie malnutrition: Secondary | ICD-10-CM

## 2014-12-01 DIAGNOSIS — I953 Hypotension of hemodialysis: Secondary | ICD-10-CM

## 2014-12-01 DIAGNOSIS — K219 Gastro-esophageal reflux disease without esophagitis: Secondary | ICD-10-CM

## 2014-12-01 DIAGNOSIS — T8789 Other complications of amputation stump: Secondary | ICD-10-CM | POA: Diagnosis not present

## 2014-12-01 DIAGNOSIS — N186 End stage renal disease: Secondary | ICD-10-CM

## 2014-12-01 DIAGNOSIS — K5900 Constipation, unspecified: Secondary | ICD-10-CM

## 2014-12-01 DIAGNOSIS — E1122 Type 2 diabetes mellitus with diabetic chronic kidney disease: Secondary | ICD-10-CM | POA: Diagnosis not present

## 2014-12-01 DIAGNOSIS — Z7901 Long term (current) use of anticoagulants: Secondary | ICD-10-CM

## 2014-12-01 DIAGNOSIS — N189 Chronic kidney disease, unspecified: Secondary | ICD-10-CM | POA: Diagnosis not present

## 2014-12-01 DIAGNOSIS — R5381 Other malaise: Secondary | ICD-10-CM

## 2014-12-01 DIAGNOSIS — D631 Anemia in chronic kidney disease: Secondary | ICD-10-CM

## 2014-12-01 DIAGNOSIS — I480 Paroxysmal atrial fibrillation: Secondary | ICD-10-CM

## 2014-12-01 MED ORDER — OXYCODONE HCL ER 15 MG PO T12A: EXTENDED_RELEASE_TABLET | ORAL | Status: DC

## 2014-12-01 NOTE — Telephone Encounter (Signed)
[facility] 

## 2014-12-02 ENCOUNTER — Encounter (HOSPITAL_COMMUNITY): Payer: Self-pay

## 2014-12-02 ENCOUNTER — Observation Stay (HOSPITAL_COMMUNITY)
Admission: EM | Admit: 2014-12-02 | Discharge: 2014-12-03 | Disposition: A | Discharge status: Home / Self Care | Payer: No Typology Code available for payment source | Attending: Family Medicine | Admitting: Family Medicine

## 2014-12-02 DIAGNOSIS — R531 Weakness: Secondary | ICD-10-CM | POA: Diagnosis not present

## 2014-12-02 DIAGNOSIS — Z794 Long term (current) use of insulin: Secondary | ICD-10-CM | POA: Diagnosis not present

## 2014-12-02 DIAGNOSIS — Z951 Presence of aortocoronary bypass graft: Secondary | ICD-10-CM | POA: Diagnosis not present

## 2014-12-02 DIAGNOSIS — D649 Anemia, unspecified: Secondary | ICD-10-CM | POA: Diagnosis not present

## 2014-12-02 DIAGNOSIS — Z992 Dependence on renal dialysis: Secondary | ICD-10-CM

## 2014-12-02 DIAGNOSIS — I5042 Chronic combined systolic (congestive) and diastolic (congestive) heart failure: Secondary | ICD-10-CM | POA: Diagnosis present

## 2014-12-02 DIAGNOSIS — I509 Heart failure, unspecified: Secondary | ICD-10-CM | POA: Insufficient documentation

## 2014-12-02 DIAGNOSIS — E119 Type 2 diabetes mellitus without complications: Secondary | ICD-10-CM | POA: Diagnosis not present

## 2014-12-02 DIAGNOSIS — E785 Hyperlipidemia, unspecified: Secondary | ICD-10-CM | POA: Diagnosis present

## 2014-12-02 DIAGNOSIS — R7989 Other specified abnormal findings of blood chemistry: Secondary | ICD-10-CM | POA: Diagnosis present

## 2014-12-02 DIAGNOSIS — N186 End stage renal disease: Secondary | ICD-10-CM | POA: Diagnosis not present

## 2014-12-02 DIAGNOSIS — I120 Hypertensive chronic kidney disease with stage 5 chronic kidney disease or end stage renal disease: Secondary | ICD-10-CM | POA: Insufficient documentation

## 2014-12-02 DIAGNOSIS — Z87891 Personal history of nicotine dependence: Secondary | ICD-10-CM | POA: Insufficient documentation

## 2014-12-02 DIAGNOSIS — M549 Dorsalgia, unspecified: Secondary | ICD-10-CM | POA: Insufficient documentation

## 2014-12-02 DIAGNOSIS — K219 Gastro-esophageal reflux disease without esophagitis: Secondary | ICD-10-CM | POA: Diagnosis not present

## 2014-12-02 DIAGNOSIS — I429 Cardiomyopathy, unspecified: Secondary | ICD-10-CM | POA: Insufficient documentation

## 2014-12-02 DIAGNOSIS — Z79899 Other long term (current) drug therapy: Secondary | ICD-10-CM | POA: Insufficient documentation

## 2014-12-02 DIAGNOSIS — M791 Myalgia: Secondary | ICD-10-CM | POA: Insufficient documentation

## 2014-12-02 DIAGNOSIS — I739 Peripheral vascular disease, unspecified: Secondary | ICD-10-CM | POA: Insufficient documentation

## 2014-12-02 DIAGNOSIS — I2510 Atherosclerotic heart disease of native coronary artery without angina pectoris: Secondary | ICD-10-CM | POA: Insufficient documentation

## 2014-12-02 DIAGNOSIS — I10 Essential (primary) hypertension: Secondary | ICD-10-CM | POA: Diagnosis present

## 2014-12-02 LAB — BASIC METABOLIC PANEL
ANION GAP: 13 (ref 5–15)
BUN: 23 mg/dL — AB (ref 6–20)
CALCIUM: 8.8 mg/dL — AB (ref 8.9–10.3)
CO2: 29 mmol/L (ref 22–32)
Chloride: 97 mmol/L — ABNORMAL LOW (ref 101–111)
Creatinine, Ser: 3.13 mg/dL — ABNORMAL HIGH (ref 0.61–1.24)
GFR calc non Af Amer: 22 mL/min — ABNORMAL LOW (ref >60)
GFR, EST AFRICAN AMERICAN: 25 mL/min — AB (ref >60)
Glucose, Bld: 65 mg/dL (ref 65–99)
Potassium: 4.5 mmol/L (ref 3.5–5.1)
Sodium: 139 mmol/L (ref 135–145)

## 2014-12-02 LAB — CBC WITH DIFFERENTIAL/PLATELET
BASOS ABS: 0 10*3/uL (ref 0.0–0.1)
BASOS PCT: 0 % (ref 0–1)
EOS ABS: 0 10*3/uL (ref 0.0–0.7)
Eosinophils Relative: 0 % (ref 0–5)
HCT: 22.7 % — ABNORMAL LOW (ref 39.0–52.0)
HEMOGLOBIN: 7 g/dL — AB (ref 13.0–17.0)
Lymphocytes Relative: 6 % — ABNORMAL LOW (ref 12–46)
Lymphs Abs: 0.5 10*3/uL — ABNORMAL LOW (ref 0.7–4.0)
MCH: 29.8 pg (ref 26.0–34.0)
MCHC: 30.8 g/dL (ref 30.0–36.0)
MCV: 96.6 fL (ref 78.0–100.0)
MONOS PCT: 8 % (ref 3–12)
Monocytes Absolute: 0.8 10*3/uL (ref 0.1–1.0)
Neutro Abs: 7.8 10*3/uL — ABNORMAL HIGH (ref 1.7–7.7)
Neutrophils Relative %: 86 % — ABNORMAL HIGH (ref 43–77)
Platelets: 135 10*3/uL — ABNORMAL LOW (ref 150–400)
RBC: 2.35 MIL/uL — ABNORMAL LOW (ref 4.22–5.81)
RDW: 19 % — ABNORMAL HIGH (ref 11.5–15.5)
WBC: 9.1 10*3/uL (ref 4.0–10.5)

## 2014-12-02 LAB — PROTIME-INR
INR: 1.63 — ABNORMAL HIGH (ref 0.00–1.49)
PROTHROMBIN TIME: 19.3 s — AB (ref 11.6–15.2)

## 2014-12-02 LAB — PREPARE RBC (CROSSMATCH)

## 2014-12-02 NOTE — ED Provider Notes (Signed)
CSN: [id number]     Arrival date & time 12/02/14  1920 History   First MD Initiated Contact with Patient 12/02/14 1954     Chief Complaint  Patient presents with  . Abnormal Lab     (Consider location/radiation/quality/duration/timing/severity/associated sxs/prior Treatment) HPI Comments: Patient sent from rehabilitation for reportedly low hemoglobin. Number is unknown. He was told he needs a blood transfusion. Patient is medically complex with recent AKA. He is a dialysis patient last treatment was earlier today. He states he feels relatively well and denies any chest pain, shortness of breath, nausea, vomiting or fever. Denies any bleeding in his stool or black stools. Denies any excessive bleeding from his surgical wound. He is on Coumadin for a history of vascular disease. He denies any abdominal pain, nausea or vomiting.  The history is provided by the patient and the EMS personnel.    Past Medical History  Diagnosis Date  . Coronary artery disease 07/05/2011    2D ECHO - EF ~40%, moderate concentric LV hypertrophy, LA moderately dilated, mild-moderate septal and inferior wall hypokinesis  . Hypertension   . CHF (congestive heart failure)   . Diabetes mellitus     Insulin dependent  . Cardiomyopathy   . Claudication 05/09/2011    Right and left anterior tibial arteries and Left SFA-occluded; Right CIA-<50% diameter reduction; Right Deep Profunda-70-99% diameter reduction; Right SFA->60% diameter reduction; Right Distal Popliteal/Tibial Artery- >60% diameter reduction; Left CFA and Profunda- >50% diameter reduction  . 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 GERD (gastroesophageal reflux disease)   . Peripheral vascular disease   . Chronic kidney disease (CKD), stage IV (severe)   . 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 Cardiac catheterization  05/07/2008    CABG  . Coronary artery bypass graft  06/2008    x3  . Insertion of dialysis catheter Right 10/01/2014    Procedure: INSERTION OF RIGHT INTERNAL JUGULAR DIALYSIS CATHETER;  Surgeon: Larina Earthly, MD;  Location: [facility] OR;  Service: Vascular;  Laterality: Right;  . Amputation Right 10/06/2014    Procedure: AMPUTATION ABOVE KNEE Right;  Surgeon: Sherren Kerns, MD;  Location: [facility] OR;  Service: Vascular;  Laterality: Right;  . Av fistula placement Left 10/10/2014    Procedure: LEFT BRACHIAL TO CEPHALIC ARTERIOVENOUS (AV) FISTULA CREATION;  Surgeon: Larina Earthly, MD;  Location: [facility] OR;  Service: Vascular;  Laterality: Left;  . Amputation Right 11/26/2014    Procedure: REVISION AMPUTATION ABOVE KNEE;  Surgeon: Sherren Kerns, MD;  Location: [facility] OR;  Service: Vascular;  Laterality: Right;   Family History  Problem Relation Age of Onset  . Heart disease Mother   . Hypertension Mother   . Diabetes Mother   . Arthritis Sister   . Diabetes Sister    Social History  Substance Use Topics  . Smoking status: Former Smoker -- 0.50 packs/day for 25 years    Types: Cigarettes    Quit date: 09/03/2013  . Smokeless tobacco: Never Used  . Alcohol Use: Yes     Comment: occas.    Review of Systems  Constitutional: Positive for activity change and appetite change. Negative for fever and chills.  HENT: Negative for congestion and rhinorrhea.  Respiratory: Negative for cough, chest tightness and shortness of breath.   Cardiovascular: Negative for chest pain.  Gastrointestinal: Positive for abdominal pain.  Genitourinary: Negative for dysuria, hematuria and testicular pain.  Musculoskeletal: Positive for myalgias, back pain and arthralgias.  Skin: Positive for wound. Negative for rash.  Neurological: Positive for weakness. Negative for dizziness, light-headedness and  headaches.  A complete 10 system review of systems was obtained and all systems are negative except as noted in the HPI and PMH.      Allergies  Review of patient's allergies indicates no known allergies.  Home Medications   Prior to Admission medications   Medication Sig Start Date End Date Taking? Authorizing Provider  Amino Acids-Protein Hydrolys (FEEDING SUPPLEMENT, PRO-STAT SUGAR FREE 64,) LIQD Take 30 mLs by mouth 2 (two) times daily. 10/17/14  Yes Albertine Grates, MD  amiodarone (PACERONE) 400 MG tablet Take 1 tablet (400 mg total) by mouth 2 (two) times daily. 10/17/14  Yes Albertine Grates, MD  calcium acetate (PHOSLO) 667 MG capsule Take 2 capsules (1,334 mg total) by mouth 3 (three) times daily with meals. 10/17/14  Yes Albertine Grates, MD  Darbepoetin Alfa (ARANESP) 100 MCG/0.5ML SOSY injection Inject 0.5 mLs (100 mcg total) into the vein every Tuesday with hemodialysis. 10/17/14  Yes Albertine Grates, MD  Ferrous Sulfate (IRON) 325 (65 FE) MG TABS Take 325 mg by mouth daily.   Yes Historical Provider, MD  folic acid (FOLVITE) 1 MG tablet Take 1 tablet (1 mg total) by mouth daily. 10/17/14  Yes Albertine Grates, MD  insulin aspart (NOVOLOG) 100 UNIT/ML injection Inject 0-9 Units into the skin 3 (three) times daily with meals. Before each meal 3 times a day, 140-199 - 2 units, 200-250 - 4 units, 251-299 - 6 units,  300-349 - 8 units,  350 or above 10 units. Insulin PEN if approved, provide syringes and needles if needed. Patient taking differently: Inject 0-10 Units into the skin 4 (four) times daily -  with meals and at bedtime. 140-199 - 2 units, 200-250 - 4 units, 251-299 - 6 units,  300-349 - 8 units,  350 or above 10 units. Insulin PEN if approved, provide syringes and needles if needed 10/18/14  Yes Albertine Grates, MD  Multiple Vitamins-Minerals (DECUBI-VITE PO) Take 1 tablet by mouth daily.   Yes Historical Provider, MD  nitroGLYCERIN (NITROSTAT) 0.4 MG SL tablet Place 1 tablet (0.4 mg total) under the tongue once. Patient  taking differently: Place 0.4 mg under the tongue every 5 (five) minutes as needed for chest pain.  10/01/13  Yes Mihai Croitoru, MD  Nutritional Supplements (FEEDING SUPPLEMENT, NEPRO CARB STEADY,) LIQD Take 237 mLs by mouth 2 (two) times daily between meals. 10/17/14  Yes Albertine Grates, MD  Oxycodone HCl 10 MG TABS Take one tablet by mouth every 4 hours as needed for breakthrough pain 11/28/14  Yes Samantha J Rhyne, PA-C  polyethylene glycol (MIRALAX / GLYCOLAX) packet Take 17 g by mouth daily as needed for moderate constipation. 10/17/14  Yes Albertine Grates, MD  senna-docusate (SENOKOT-S) 8.6-50 MG per tablet Take 1 tablet by mouth 2 (two) times daily. 10/17/14  Yes Albertine Grates, MD  warfarin (COUMADIN) 1 MG tablet Take 0.5-1 mg by mouth daily at 6 PM. Take 1 mg on Mon-Wed-Fri-Sat and 0.5 mg on Tues-Thurs-Sun 11/18/14  Yes Historical Provider, MD  midodrine (PROAMATINE) 10 MG tablet Take 1 tablet (10 mg total) by mouth Every Tuesday,Thursday,and Saturday with dialysis. 10/18/14   Albertine Grates, MD  OxyCODONE (  OXYCONTIN) 15 mg T12A 12 hr tablet Take one tablet by mouth every 12 hours for pain. Do not crush 12/01/14   Tiffany L Reed, DO  pantoprazole (PROTONIX) 40 MG tablet Take 40 mg by mouth daily.  05/08/13   Historical Provider, MD   BP 108/74 mmHg  Pulse 73  Temp(Src) 98.7 F (37.1 C) (Oral)  Resp 18  Ht 5\' 11"  (1.803 m)  Wt 147 lb 11.2 oz (66.996 kg)  BMI 20.61 kg/m2  SpO2 94% Physical Exam  Constitutional: He is oriented to person, place, and time. He appears well-developed and well-nourished. No distress.  HENT:  Head: Normocephalic and atraumatic.  Mouth/Throat: Oropharynx is clear and moist. No oropharyngeal exudate.  Eyes: Conjunctivae and EOM are normal. Pupils are equal, round, and reactive to light.  Neck: Normal range of motion. Neck supple.  Cardiovascular: Normal rate, regular rhythm and normal heart sounds.   No murmur heard. Pulmonary/Chest: Effort normal and breath sounds normal. No respiratory  distress. He has no wheezes.  Dialysis cath chestDialysis catheter RU chest  Abdominal: Soft. There is no tenderness. There is no rebound and no guarding.  Musculoskeletal: Normal range of motion. He exhibits edema and tenderness.  Right AKA. Left leg with multiple areas of ulceration draining serous fluid. Unable to palpate pulses. Dopplerable PT pulse. Left AC has brachiocephalic AV fistula positive thrill. +seroma noted on recent eval   Neurological: He is alert and oriented to person, place, and time. No cranial nerve deficit.  Skin: Skin is warm.    ED Course  Procedures (including critical care time) Labs Review Labs Reviewed  CBC WITH DIFFERENTIAL/PLATELET - Abnormal; Notable for the following:    RBC 2.35 (*)    Hemoglobin 7.0 (*)    HCT 22.7 (*)    RDW 19.0 (*)    Platelets 135 (*)    Neutrophils Relative % 86 (*)    Neutro Abs 7.8 (*)    Lymphocytes Relative 6 (*)    Lymphs Abs 0.5 (*)    All other components within normal limits  BASIC METABOLIC PANEL - Abnormal; Notable for the following:    Chloride 97 (*)    BUN 23 (*)    Creatinine, Ser 3.13 (*)    Calcium 8.8 (*)    GFR calc non Af Amer 22 (*)    GFR calc Af Amer 25 (*)    All other components within normal limits  PROTIME-INR - Abnormal; Notable for the following:    Prothrombin Time 19.3 (*)    INR 1.63 (*)    All other components within normal limits  LIPID PANEL - Abnormal; Notable for the following:    HDL 40 (*)    All other components within normal limits  GLUCOSE, CAPILLARY - Abnormal; Notable for the following:    Glucose-Capillary 114 (*)    All other components within normal limits  GLUCOSE, CAPILLARY - Abnormal; Notable for the following:    Glucose-Capillary 114 (*)    All other components within normal limits  MRSA PCR SCREENING  HEMOGLOBIN A1C  PROTIME-INR  TYPE AND SCREEN  PREPARE RBC (CROSSMATCH)    Imaging Review No results found.   EKG Interpretation None      MDM    Final diagnoses:  Anemia, unspecified anemia type  ESRD (end stage renal disease)   From rehabilitation with reportedly low hemoglobin. On Coumadin. Denies any blood in the stool. He refuses rectal exam  Hemoglobin 7.0. Was 7.2 on discharge from hospital  or days ago. INR 1.6.  Patient refusing rectal exam.  Discussed with Dr. Hyman Hopes for nephrology. He feels patient should be transfused one unit of blood and dialyzed tomorrow. He feels given his recent surgery and complex medical history he should be observed overnight. He will need dialysis in the morning. D/w Dr. Welton Flakes.  Glynn Octave, MD 12/03/14 [phone]

## 2014-12-02 NOTE — Progress Notes (Signed)
Patient ID: [name], male   DOB: December 07, 1965, 49 y.o.   MRN: [id number]    DATE:  12/01/14 MRN:  [id number]  BIRTHDAY: 1965/09/30  Facility:  Nursing Home Location:  [facility]  Nursing Home Room Number: [id number]  LEVEL OF CARE:  SNF (31)  Contact Information    Name Relation Home Work Mobile   Harper,Deloris Mother [phone]     Price,Sherri Sister   [phone]       Chief Complaint  Patient presents with  . Hospitalization Follow-up    Non-healing right AKA, PAF, ESRD, Diabetes Mellitus, Anemia, Hypotension, GERD, Constipation and Protein-calorie malnutrition    HISTORY OF PRESENT ILLNESS:  This is a 49 year old male who has been re-admitted to [facility] on 11/28/14 from [facility]. He had right AKA on 10/06/14. He goes to hemodialysis via diatek on TTS. He recently had 2 stitches on his left antecubital incision. He was having right AKA stump drainage. No fevers. He had right AKA to high above the knee on 11/26/14. On POD2, his left upper arm fistula had a new enlarged bulge at the antecubital space (same location as AV anastomosis). Duplex ultrasound was negative for pseudoaneurysm with a non-vascularized cystic structure.  He has been admitted for a short-term rehabilitation.   PAST MEDICAL HISTORY:  Past Medical History  Diagnosis Date  . Coronary artery disease 07/05/2011    2D ECHO - EF ~40%, moderate concentric LV hypertrophy, LA moderately dilated, mild-moderate septal and inferior wall hypokinesis  . Hypertension   . CHF (congestive heart failure)   . Diabetes mellitus     Insulin dependent  . Cardiomyopathy   . Claudication 05/09/2011    Right and left anterior tibial arteries and Left SFA-occluded; Right CIA-<50% diameter reduction; Right Deep Profunda-70-99% diameter reduction; Right SFA->60% diameter reduction; Right Distal Popliteal/Tibial Artery- >60% diameter reduction; Left CFA and Profunda- >50% diameter reduction  . S/P CABG  (coronary artery bypass graft) 03/23/2011`    STRESS TEST - LV EF 29%, mild reversible within the apical segment of the anterior and anterolateral wall, small infarct in the inferolateral wall, global hypokinesia  . GERD (gastroesophageal reflux disease)   . Peripheral vascular disease   . Chronic kidney disease (CKD), stage IV (severe)   . S/P AKA (above knee amputation) 11/26/2014    REVISION OF AMPUTATION     CURRENT MEDICATIONS: Reviewed  Patient's Medications  New Prescriptions   No medications on file  Previous Medications   AMINO ACIDS-PROTEIN HYDROLYS (FEEDING SUPPLEMENT, PRO-STAT SUGAR FREE 64,) LIQD    Take 30 mLs by mouth 2 (two) times daily.   AMIODARONE (PACERONE) 400 MG TABLET    Take 1 tablet (400 mg total) by mouth 2 (two) times daily.   CALCIUM ACETATE (PHOSLO) 667 MG CAPSULE    Take 2 capsules (1,334 mg total) by mouth 3 (three) times daily with meals.   COLLAGENASE (SANTYL) OINTMENT    Apply topically daily.   DARBEPOETIN ALFA (ARANESP) 100 MCG/0.5ML SOSY INJECTION    Inject 0.5 mLs (100 mcg total) into the vein every Tuesday with hemodialysis.   FERROUS SULFATE (IRON) 325 (65 FE) MG TABS    Take 325 mg by mouth daily.   FOLIC ACID (FOLVITE) 1 MG TABLET    Take 1 tablet (1 mg total) by mouth daily.   INSULIN ASPART (NOVOLOG) 100 UNIT/ML INJECTION    Inject 0-9 Units into the skin 3 (three) times daily with meals. Before each meal 3 times  a day, 140-199 - 2 units, 200-250 - 4 units, 251-299 - 6 units,  300-349 - 8 units,  350 or above 10 units. Insulin PEN if approved, provide syringes and needles if needed.   MELATONIN 3 MG TABS    Take 3 mg by mouth at bedtime.   MIDODRINE (PROAMATINE) 10 MG TABLET    Take 1 tablet (10 mg total) by mouth Every Tuesday,Thursday,and Saturday with dialysis.   MULTIPLE VITAMINS-MINERALS (DECUBI-VITE PO)    Take 1 tablet by mouth daily.   MULTIVITAMIN (RENA-VIT) TABS TABLET    Take 1 tablet by mouth at bedtime.   NITROGLYCERIN (NITROSTAT)  0.4 MG SL TABLET    Place 1 tablet (0.4 mg total) under the tongue once.   NUTRITIONAL SUPPLEMENTS (FEEDING SUPPLEMENT, NEPRO CARB STEADY,) LIQD    Take 237 mLs by mouth 2 (two) times daily between meals.   OXYCODONE (OXYCONTIN) 15 MG T12A 12 HR TABLET    Take one tablet by mouth every 12 hours for pain. Do not crush   OXYCODONE HCL 10 MG TABS    Take one tablet by mouth every 4 hours as needed for breakthrough pain   PANTOPRAZOLE (PROTONIX) 40 MG TABLET    Take 40 mg by mouth daily.    POLYETHYLENE GLYCOL (MIRALAX / GLYCOLAX) PACKET    Take 17 g by mouth daily as needed for moderate constipation.   SENNA-DOCUSATE (SENOKOT-S) 8.6-50 MG PER TABLET    Take 1 tablet by mouth 2 (two) times daily.   WARFARIN (COUMADIN) 1 MG TABLET    Take 0.5-1 mg by mouth daily at 6 PM. Take 1 mg on Mon-Wed-Fri-Sat and 0.5 mg on Tues-Thurs-Sun  Modified Medications   No medications on file  Discontinued Medications   No medications on file     No Known Allergies   REVIEW OF SYSTEMS:  GENERAL: no change in appetite, no fatigue, no weight changes, no fever, chills or weakness EYES: Denies change in vision, dry eyes, eye pain, itching or discharge EARS: Denies change in hearing, ringing in ears, or earache NOSE: Denies nasal congestion or epistaxis MOUTH and THROAT: Denies oral discomfort, gingival pain or bleeding, pain from teeth or hoarseness   RESPIRATORY: no cough, SOB, DOE, wheezing, hemoptysis CARDIAC: no chest pain, edema or palpitations GI: no abdominal pain, diarrhea, constipation, heart burn, nausea or vomiting GU: Denies dysuria, frequency, hematuria, incontinence, or discharge MUSCULOSKELETAL: Denies joit pain, muscle pain, back pain, restricted movement, or unusual weakness   PHYSICAL EXAMINATION  GENERAL APPEARANCE: Well nourished. In no acute distress. Normal body habitus SKIN:  Right high AKA stump is dry, covered with dry dressing and left anterior shin covered with dry dressing; left  foot cluster of wounds; left foot/shin with unna boot HEAD: Normal in size and contour. No evidence of trauma EYES: Lids open and close normally. No blepharitis, entropion or ectropion. PERRL. Conjunctivae are clear and sclerae are white. Lenses are without opacity EARS: Pinnae are normal. Patient hears normal voice tunes of the examiner MOUTH and THROAT: Lips are without lesions. Oral mucosa is moist and without lesions. Tongue is normal in shape, size, and color and without lesions NECK: supple, trachea midline, no neck masses, no thyroid tenderness, no thyromegaly LYMPHATICS: no LAN in the neck, no supraclavicular LAN RESPIRATORY: breathing is even & unlabored, BS CTAB CARDIAC: RRR, no murmur,no extra heart sounds, no edema; right chest diatek GI: abdomen soft, normal BS, no masses, no tenderness, no hepatomegaly, no splenomegaly EXTREMITIES:  Right high  AKA PSYCHIATRIC: Alert and oriented X 3. Affect and behavior are appropriate  LABS/RADIOLOGY: Labs reviewed: Basic Metabolic Panel:  Recent Labs  16/10/96 2301  10/03/14 0235  10/08/14 0901  11/25/14 1846 11/25/14 2004  11/26/14 1612 11/27/14 0417 11/28/14 0343  NA 140  < > 136  < > 132*  < > 133* 133*  < > 137 135 137  K 3.8  < > 3.7  < > 3.7  < > 4.8 4.8  < > 3.4* 4.1 3.8  CL 101  < > 96*  < > 96*  < > 93* 95*  < > 97* 95* 96*  CO2 17*  < > 23  < > 21*  < > 22 22  < > 25 24 27   GLUCOSE 162*  < > 171*  < > 136*  < > 120* 114*  < > 109* 141* 156*  BUN 67*  < > 31*  < > 31*  < > 67* 67*  < > 36* 45* 23*  CREATININE 4.65*  < > 3.68*  < > 3.66*  < > 6.76* 6.69*  < > 4.19*  4.25* 4.71* 2.92*  CALCIUM 8.2*  < > 8.2*  < > 8.3*  < > 8.4* 8.3*  < > 8.2* 8.1* 8.3*  MG 2.2  --  2.0  --  1.8  --   --   --   --   --   --   --   PHOS  --   < >  --   < >  --   < > 10.0* 10.1*  --  7.5*  --   --   < > = values in this interval not displayed. Liver Function Tests:  Recent Labs  10/05/14 0248  10/08/14 0901  10/16/14 0548   11/25/14 1846 11/25/14 2004 11/26/14 1612  AST 7*  --  19  --  22  --   --   --   --   ALT 11*  --  13*  --  16*  --   --   --   --   ALKPHOS 107  --  95  --  104  --   --   --   --   BILITOT 2.3*  --  2.6*  --  1.6*  --   --   --   --   PROT 6.2*  --  5.7*  --  5.8*  --   --   --   --   ALBUMIN 2.3*  < > 2.1*  < > 2.2*  2.2*  < > 2.3* 2.2* 2.4*  < > = values in this interval not displayed.  Recent Labs  09/26/14 0100 10/04/14 0335  LIPASE 106* 119*    Recent Labs  09/30/14 0110  AMMONIA 28   CBC:  Recent Labs  09/28/14 0540 09/29/14 0013  10/08/14 0901  11/26/14 1612 11/27/14 0417 11/28/14 0343  WBC 12.5* 12.2*  < > 6.5  < > 7.5 8.0 9.9  NEUTROABS 11.2* 11.2*  --  5.2  --   --   --   --   HGB 11.3* 10.8*  < > 10.1*  < > 7.4* 7.6* 7.2*  HCT 34.9* 33.5*  < > 30.7*  < > 23.1* 24.1* 22.8*  MCV 87.7 88.6  < > 85.3  < > 93.5 93.1 94.2  PLT 182 172  < > 128*  < > 91* 97* 116*  < > =  values in this interval not displayed. A1C: Invalid input(s): A1C Lipid Panel:  Recent Labs  09/25/14 0410 09/26/14 0345  HDL <10* <10*   Cardiac Enzymes:  Recent Labs  09/30/14 0110 09/30/14 0510 09/30/14 1303  TROPONINI 0.07* 0.10* 0.11*   BNP: Invalid input(s): POCBNP CBG:  Recent Labs  11/27/14 2043 11/28/14 0556 11/28/14 1059  GLUCAP 104* 152* 140*     ASSESSMENT/PLAN:  Physical deconditioning - for rehabilitation  Non-healing right AKA S/P high AKA -  Continue Oxycodone 15 mg 12 hr 1 tab PO Q 12 hours and Oxycodone 10 mg IR Q 4 hours PRN for pain; follow-up with DR. Fields, Physiological scientist, in 4 weeks  PAF - rate-controlled; continue Amiodrone 400 mg 1 tab PO BID and Coumadin   Long term use of anticoagulant - INR 1.4, subtherapeutic; give Coumadin 5 mg 1 tab PO X 1 today then Coumadin 1 mg PO Q D; INR on 12/05/14  ESRD - continue hemodialysis via diatek; continue Phoslo 667 2 capsules = 1334 mg PO TID with meals  Diabetes Mellitus, type 2 - continue  Novolog sliding scale SQ TID with meals  Anemia of chronic disease - hgb 7.2; continue Aranesp 100 mcg/0.5 ml via IV on Tuesdays with hemodialysis; check CBC  Hemodialysis associated hypotension - continue Midodrine 10 mg 1 tab PO Q TTHSat  GERD - continue Protonix 40 mg 1 tab PO Q D  Constipation - continue Miralax 17 gm PO Q D PRN and Senna-S 1 tab PO BID  Protein-calorie malnutrition, severe - albumin 2.4; continue supplementation; check BMP    Goals of care:  Short-term rehabilitation    [facility], NP [facility] [phone]

## 2014-12-02 NOTE — ED Notes (Signed)
Per [facility], from [facility]- Pt. Has low hemoglobin and facility requests blood transfusion. Last week BKA changed to AKA. Pt. Given pain medication prior to arrival.

## 2014-12-02 NOTE — H&P (Signed)
[facility] History and Physical  [name] ZOX:[id number] DOB: [birth year] DOA: 12/02/2014  Referring physician: Glynn Octave, MD PCP: [name], NP   Chief Complaint: Symptomatic anemia  HPI: [name] is a 49 y.o. 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 The patient had a AKA done on June 13 and was discharged on August 5 to a rehab facility. Patient had lab drawn at the facility and was noted to have a low hemoglobin. Patient has been undergoing regular dialysis and his last Hgb was noted to be around 7.2 grams. Patient here in the ED had a Hgb of 7 gram. He does not have any chest pain noted. He does have some SOB with exertion though not very pronounced. He has no black stools and he has not had any bleeding from the wound. He has no hematemesis noted. He is on chronic coumadin therapy however and his INR was noted to be 1.63   Review of Systems:  Complete ROS performed and was unremarkable other than HPI  Past Medical History  Diagnosis Date  . Coronary artery disease 07/05/2011    2D ECHO - EF ~40%, moderate concentric LV hypertrophy, LA moderately dilated, mild-moderate septal and inferior wall hypokinesis  . Hypertension   . CHF (congestive heart failure)   . Diabetes mellitus     Insulin dependent  . Cardiomyopathy   . Claudication 05/09/2011    Right and left anterior tibial arteries and Left SFA-occluded; Right CIA-<50% diameter reduction; Right Deep Profunda-70-99% diameter reduction; Right SFA->60% diameter reduction; Right Distal Popliteal/Tibial Artery- >60% diameter reduction; Left CFA and Profunda- >50% diameter reduction  . 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 GERD (gastroesophageal reflux disease)   . Peripheral vascular  disease   . Chronic kidney disease (CKD), stage IV (severe)   . 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 Cardiac catheterization  05/07/2008    CABG  . Coronary artery bypass graft  06/2008    x3  . Insertion of dialysis catheter Right 10/01/2014    Procedure: INSERTION OF RIGHT INTERNAL JUGULAR DIALYSIS CATHETER;  Surgeon: Larina Earthly, MD;  Location: [facility] OR;  Service: Vascular;  Laterality: Right;  . Amputation Right 10/06/2014    Procedure: AMPUTATION ABOVE KNEE Right;  Surgeon: Sherren Kerns, MD;  Location: [facility] OR;  Service: Vascular;  Laterality: Right;  . Av fistula placement Left 10/10/2014    Procedure: LEFT BRACHIAL TO CEPHALIC ARTERIOVENOUS (AV) FISTULA CREATION;  Surgeon: Larina Earthly, MD;  Location: [facility] OR;  Service: Vascular;  Laterality: Left;  . Amputation Right 11/26/2014    Procedure: REVISION AMPUTATION ABOVE KNEE;  Surgeon: Sherren Kerns, MD;  Location: [facility] OR;  Service: Vascular;  Laterality: Right;   Social History:  reports that he quit smoking about 14 months ago. His smoking use included Cigarettes. He has a 12.5 pack-year smoking history. He has never used smokeless tobacco. He reports that he drinks alcohol. He reports that he uses illicit drugs (Marijuana).  No Known Allergies  Family History  Problem Relation Age of Onset  . Heart disease Mother   . Hypertension Mother   . Diabetes Mother   . Arthritis Sister   .  Diabetes Sister      Prior to Admission medications   Medication Sig Start Date End Date Taking? Authorizing Provider  Amino Acids-Protein Hydrolys (FEEDING SUPPLEMENT, PRO-STAT SUGAR FREE 64,) LIQD Take 30 mLs by mouth 2 (two) times daily. 10/17/14   Albertine Grates, MD  amiodarone (PACERONE) 400 MG tablet Take 1 tablet (400 mg total) by mouth 2 (two) times daily. 10/17/14   Albertine Grates, MD  calcium acetate (PHOSLO) 667 MG capsule Take 2 capsules (1,334 mg total) by mouth 3 (three)  times daily with meals. 10/17/14   Albertine Grates, MD  collagenase (SANTYL) ointment Apply topically daily. Patient not taking: Reported on 11/27/2014 10/17/14   Albertine Grates, MD  Darbepoetin Alfa (ARANESP) 100 MCG/0.5ML SOSY injection Inject 0.5 mLs (100 mcg total) into the vein every Tuesday with hemodialysis. 10/17/14   Albertine Grates, MD  Ferrous Sulfate (IRON) 325 (65 FE) MG TABS Take 325 mg by mouth daily.    Historical Provider, MD  folic acid (FOLVITE) 1 MG tablet Take 1 tablet (1 mg total) by mouth daily. 10/17/14   Albertine Grates, MD  insulin aspart (NOVOLOG) 100 UNIT/ML injection Inject 0-9 Units into the skin 3 (three) times daily with meals. Before each meal 3 times a day, 140-199 - 2 units, 200-250 - 4 units, 251-299 - 6 units,  300-349 - 8 units,  350 or above 10 units. Insulin PEN if approved, provide syringes and needles if needed. Patient taking differently: Inject 0-10 Units into the skin 4 (four) times daily -  with meals and at bedtime. 140-199 - 2 units, 200-250 - 4 units, 251-299 - 6 units,  300-349 - 8 units,  350 or above 10 units. Insulin PEN if approved, provide syringes and needles if needed 10/18/14   Albertine Grates, MD  Melatonin 3 MG TABS Take 3 mg by mouth at bedtime.    Historical Provider, MD  midodrine (PROAMATINE) 10 MG tablet Take 1 tablet (10 mg total) by mouth Every Tuesday,Thursday,and Saturday with dialysis. 10/18/14   Albertine Grates, MD  Multiple Vitamins-Minerals (DECUBI-VITE PO) Take 1 tablet by mouth daily.    Historical Provider, MD  multivitamin (RENA-VIT) TABS tablet Take 1 tablet by mouth at bedtime. Patient not taking: Reported on 11/27/2014 10/17/14   Albertine Grates, MD  nitroGLYCERIN (NITROSTAT) 0.4 MG SL tablet Place 1 tablet (0.4 mg total) under the tongue once. Patient taking differently: Place 0.4 mg under the tongue every 5 (five) minutes as needed for chest pain.  10/01/13   Mihai Croitoru, MD  Nutritional Supplements (FEEDING SUPPLEMENT, NEPRO CARB STEADY,) LIQD Take 237 mLs by mouth 2 (two) times  daily between meals. Patient not taking: Reported on 11/27/2014 10/17/14   Albertine Grates, MD  OxyCODONE (OXYCONTIN) 15 mg T12A 12 hr tablet Take one tablet by mouth every 12 hours for pain. Do not crush 12/01/14   Tiffany L Reed, DO  Oxycodone HCl 10 MG TABS Take one tablet by mouth every 4 hours as needed for breakthrough pain 11/28/14   Samantha J Rhyne, PA-C  pantoprazole (PROTONIX) 40 MG tablet Take 40 mg by mouth daily.  05/08/13   Historical Provider, MD  polyethylene glycol (MIRALAX / GLYCOLAX) packet Take 17 g by mouth daily as needed for moderate constipation. 10/17/14   Albertine Grates, MD  senna-docusate (SENOKOT-S) 8.6-50 MG per tablet Take 1 tablet by mouth 2 (two) times daily. 10/17/14   Albertine Grates, MD  warfarin (COUMADIN) 1 MG tablet Take 0.5-1 mg by mouth daily at 6  PM. Take 1 mg on Mon-Wed-Fri-Sat and 0.5 mg on Tues-Thurs-Sun 11/18/14   Historical Provider, MD   Physical Exam: Filed Vitals:   12/02/14 1929 12/02/14 1930 12/02/14 1936  BP: 112/59    Pulse: 67    Temp: 98.7 F (37.1 C)    TempSrc: Oral    Resp: 17    Height:   5\' 11"  (1.803 m)  Weight:   71.668 kg (158 lb)  SpO2: 100% 98%     Wt Readings from Last 3 Encounters:  12/02/14 71.668 kg (158 lb)  12/01/14 71.759 kg (158 lb 3.2 oz)  11/28/14 57.743 kg (127 lb 4.8 oz)    General:  Appears calm and comfortable Eyes: PERRL, normal lids, irises & conjunctiva ENT: grossly normal hearing, lips & tongue Neck: no LAD, masses or thyromegaly Cardiovascular: RRR, no m/r/g. Left leg minimal LE edema Respiratory: CTA bilaterally, no w/r/r. Normal respiratory effort. Abdomen: soft, ntnd Skin: chronic LE vascular changes which he states are improving RL AKA Musculoskeletal: grossly normal tone BUE/BLE Psychiatric: grossly normal mood and affect Neurologic: grossly non-focal.          Labs on Admission:  Basic Metabolic Panel:  Recent Labs Lab 11/26/14 0432 11/26/14 1612 11/27/14 0417 11/28/14 0343 12/02/14 1956  NA 137 137 135 137  139  K 3.2* 3.4* 4.1 3.8 4.5  CL 97* 97* 95* 96* 97*  CO2 28 25 24 27 29   GLUCOSE 107* 109* 141* 156* 65  BUN 30* 36* 45* 23* 23*  CREATININE 3.59* 4.19*  4.25* 4.71* 2.92* 3.13*  CALCIUM 8.1* 8.2* 8.1* 8.3* 8.8*  PHOS  --  7.5*  --   --   --    Liver Function Tests:  Recent Labs Lab 11/26/14 1612  ALBUMIN 2.4*   No results for input(s): LIPASE, AMYLASE in the last 168 hours. No results for input(s): AMMONIA in the last 168 hours. CBC:  Recent Labs Lab 11/26/14 0432 11/26/14 1612 11/27/14 0417 11/28/14 0343 12/02/14 1956  WBC 8.2 7.5 8.0 9.9 9.1  NEUTROABS  --   --   --   --  7.8*  HGB 8.0* 7.4* 7.6* 7.2* 7.0*  HCT 24.9* 23.1* 24.1* 22.8* 22.7*  MCV 92.6 93.5 93.1 94.2 96.6  PLT 94* 91* 97* 116* 135*   Cardiac Enzymes: No results for input(s): CKTOTAL, CKMB, CKMBINDEX, TROPONINI in the last 168 hours.  BNP (last 3 results)  Recent Labs  09/25/14 0410  BNP 2227.0*    ProBNP (last 3 results) No results for input(s): PROBNP in the last 8760 hours.  CBG:  Recent Labs Lab 11/27/14 1120 11/27/14 1605 11/27/14 2043 11/28/14 0556 11/28/14 1059  GLUCAP 144* 111* 104* 152* 140*    Radiological Exams on Admission: No results found.    Assessment/Plan Principal Problem:   Symptomatic anemia Active Problems:   HTN (hypertension)   Hyperlipidemia   GERD (gastroesophageal reflux disease)   Chronic combined systolic and diastolic CHF (congestive heart failure)   Diabetes type 2, controlled   CKD (chronic kidney disease) stage V requiring chronic dialysis   1. Symptomatic Anemia -patient being admitted for transfusion nephrology is aware -will need HD in the morning per nephrology  2. HTN -will monitor pressures  3. Hyperlipidemia -check lipid panel in am  4. GERD -continue with PPI  5. CHF -currently compensated will monitor  6. 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 Diabetes Mellitus Type 2 -will monitor FSBS -SSI as needed   Code  Status: Full Code (must indicate code status--if unknown or must be presumed, indicate so) DVT Prophylaxis:SCD Family Communication: none (indicate person spoken with, if applicable, with phone number if by telephone) Disposition Plan: Home (indicate anticipated LOS)  Time spent:  [facility] Pager [phone]

## 2014-12-03 DIAGNOSIS — Z992 Dependence on renal dialysis: Secondary | ICD-10-CM

## 2014-12-03 DIAGNOSIS — N186 End stage renal disease: Secondary | ICD-10-CM | POA: Diagnosis not present

## 2014-12-03 DIAGNOSIS — E119 Type 2 diabetes mellitus without complications: Secondary | ICD-10-CM

## 2014-12-03 DIAGNOSIS — D649 Anemia, unspecified: Secondary | ICD-10-CM | POA: Diagnosis not present

## 2014-12-03 DIAGNOSIS — I5042 Chronic combined systolic (congestive) and diastolic (congestive) heart failure: Secondary | ICD-10-CM | POA: Diagnosis not present

## 2014-12-03 DIAGNOSIS — I10 Essential (primary) hypertension: Secondary | ICD-10-CM

## 2014-12-03 LAB — PROTIME-INR
INR: 1.76 — AB (ref 0.00–1.49)
PROTHROMBIN TIME: 20.5 s — AB (ref 11.6–15.2)

## 2014-12-03 LAB — BASIC METABOLIC PANEL
Anion gap: 15 (ref 5–15)
BUN: 32 mg/dL — ABNORMAL HIGH (ref 6–20)
CHLORIDE: 96 mmol/L — AB (ref 101–111)
CO2: 24 mmol/L (ref 22–32)
CREATININE: 3.74 mg/dL — AB (ref 0.61–1.24)
Calcium: 8.5 mg/dL — ABNORMAL LOW (ref 8.9–10.3)
GFR calc non Af Amer: 17 mL/min — ABNORMAL LOW (ref >60)
GFR, EST AFRICAN AMERICAN: 20 mL/min — AB (ref >60)
Glucose, Bld: 178 mg/dL — ABNORMAL HIGH (ref 65–99)
POTASSIUM: 5.2 mmol/L — AB (ref 3.5–5.1)
SODIUM: 135 mmol/L (ref 135–145)

## 2014-12-03 LAB — LIPID PANEL
Cholesterol: 99 mg/dL (ref 0–200)
HDL: 40 mg/dL — ABNORMAL LOW (ref >40)
LDL Cholesterol: 43 mg/dL (ref 0–99)
TRIGLYCERIDES: 80 mg/dL (ref <150)
Total CHOL/HDL Ratio: 2.5 RATIO
VLDL: 16 mg/dL (ref 0–40)

## 2014-12-03 LAB — CBC
HEMATOCRIT: 25 % — AB (ref 39.0–52.0)
HEMOGLOBIN: 7.9 g/dL — AB (ref 13.0–17.0)
MCH: 29.8 pg (ref 26.0–34.0)
MCHC: 31.6 g/dL (ref 30.0–36.0)
MCV: 94.3 fL (ref 78.0–100.0)
PLATELETS: 125 10*3/uL — AB (ref 150–400)
RBC: 2.65 MIL/uL — ABNORMAL LOW (ref 4.22–5.81)
RDW: 19.6 % — ABNORMAL HIGH (ref 11.5–15.5)
WBC: 11 10*3/uL — ABNORMAL HIGH (ref 4.0–10.5)

## 2014-12-03 LAB — TYPE AND SCREEN
ABO/RH(D): O POS
ANTIBODY SCREEN: NEGATIVE
Unit division: 0

## 2014-12-03 LAB — GLUCOSE, CAPILLARY
GLUCOSE-CAPILLARY: 183 mg/dL — AB (ref 65–99)
GLUCOSE-CAPILLARY: 96 mg/dL (ref 65–99)
Glucose-Capillary: 114 mg/dL — ABNORMAL HIGH (ref 65–99)
Glucose-Capillary: 114 mg/dL — ABNORMAL HIGH (ref 65–99)

## 2014-12-03 LAB — MRSA PCR SCREENING: MRSA by PCR: NEGATIVE

## 2014-12-03 MED ORDER — MELATONIN 3 MG PO TABS
3.0000 mg | ORAL_TABLET | Freq: Every day | ORAL | Status: DC
  Filled 2014-12-03: qty 1

## 2014-12-03 MED ORDER — FERROUS SULFATE 325 (65 FE) MG PO TABS
325.0000 mg | ORAL_TABLET | Freq: Every day | ORAL | Status: DC
  Administered 2014-12-03: 325 mg via ORAL
  Filled 2014-12-03: qty 1

## 2014-12-03 MED ORDER — OXYCODONE HCL ER 15 MG PO T12A: EXTENDED_RELEASE_TABLET | ORAL | Status: DC

## 2014-12-03 MED ORDER — AMIODARONE HCL 200 MG PO TABS
400.0000 mg | ORAL_TABLET | Freq: Two times a day (BID) | ORAL | Status: DC
  Administered 2014-12-03: 400 mg via ORAL
  Filled 2014-12-03: qty 2

## 2014-12-03 MED ORDER — MIDODRINE HCL 5 MG PO TABS: 10.0000 mg | ORAL_TABLET | ORAL | Status: DC

## 2014-12-03 MED ORDER — DARBEPOETIN ALFA 100 MCG/0.5ML IJ SOSY: 100.0000 ug | PREFILLED_SYRINGE | INTRAMUSCULAR | Status: DC

## 2014-12-03 MED ORDER — PRO-STAT SUGAR FREE PO LIQD
30.0000 mL | Freq: Two times a day (BID) | ORAL | Status: DC
  Filled 2014-12-03: qty 30

## 2014-12-03 MED ORDER — POLYETHYLENE GLYCOL 3350 17 G PO PACK: 17.0000 g | PACK | Freq: Every day | ORAL | Status: DC | PRN

## 2014-12-03 MED ORDER — WARFARIN 0.5 MG HALF TABLET: 0.5000 mg | ORAL_TABLET | Freq: Every day | ORAL | Status: DC

## 2014-12-03 MED ORDER — PANTOPRAZOLE SODIUM 40 MG PO TBEC
40.0000 mg | DELAYED_RELEASE_TABLET | Freq: Every day | ORAL | Status: DC
  Administered 2014-12-03: 40 mg via ORAL
  Filled 2014-12-03: qty 1

## 2014-12-03 MED ORDER — WARFARIN 0.5 MG HALF TABLET: 0.5000 mg | ORAL_TABLET | ORAL | Status: DC

## 2014-12-03 MED ORDER — CALCIUM ACETATE (PHOS BINDER) 667 MG PO CAPS
1334.0000 mg | ORAL_CAPSULE | Freq: Three times a day (TID) | ORAL | Status: DC
  Administered 2014-12-03 (×2): 1334 mg via ORAL
  Filled 2014-12-03 (×2): qty 2

## 2014-12-03 MED ORDER — NEPRO/CARBSTEADY PO LIQD
237.0000 mL | Freq: Two times a day (BID) | ORAL | Status: DC
  Administered 2014-12-03: 237 mL via ORAL

## 2014-12-03 MED ORDER — WARFARIN - PHYSICIAN DOSING INPATIENT: Freq: Every day | Status: DC

## 2014-12-03 MED ORDER — OXYCODONE HCL 10 MG PO TABS: ORAL_TABLET | ORAL | Status: DC

## 2014-12-03 MED ORDER — FOLIC ACID 1 MG PO TABS
1.0000 mg | ORAL_TABLET | Freq: Every day | ORAL | Status: DC
  Administered 2014-12-03: 1 mg via ORAL
  Filled 2014-12-03: qty 1

## 2014-12-03 MED ORDER — SENNOSIDES-DOCUSATE SODIUM 8.6-50 MG PO TABS
1.0000 | ORAL_TABLET | Freq: Two times a day (BID) | ORAL | Status: DC
  Administered 2014-12-03: 1 via ORAL
  Filled 2014-12-03: qty 1

## 2014-12-03 MED ORDER — OXYCODONE HCL ER 15 MG PO T12A: 15.0000 mg | EXTENDED_RELEASE_TABLET | Freq: Once | ORAL | Status: DC

## 2014-12-03 MED ORDER — INSULIN ASPART 100 UNIT/ML ~~LOC~~ SOLN
0.0000 [IU] | Freq: Three times a day (TID) | SUBCUTANEOUS | Status: DC
  Administered 2014-12-03: 3 [IU] via SUBCUTANEOUS

## 2014-12-03 MED ORDER — IRON 325 (65 FE) MG PO TABS: 325.0000 mg | ORAL_TABLET | Freq: Every day | ORAL | Status: DC

## 2014-12-03 MED ORDER — FERROUS SULFATE 325 (65 FE) MG PO TABS: 325.0000 mg | ORAL_TABLET | Freq: Every day | ORAL | Status: DC

## 2014-12-03 MED ORDER — OXYCODONE HCL 5 MG PO TABS
5.0000 mg | ORAL_TABLET | ORAL | Status: AC | PRN
  Administered 2014-12-03 (×2): 5 mg via ORAL
  Filled 2014-12-03 (×2): qty 1

## 2014-12-03 MED ORDER — WARFARIN SODIUM 1 MG PO TABS
1.0000 mg | ORAL_TABLET | ORAL | Status: DC
  Filled 2014-12-03: qty 1

## 2014-12-03 NOTE — Progress Notes (Signed)
Pt d/c back to [facility] via ambulance.

## 2014-12-03 NOTE — Progress Notes (Signed)
Attempted report to [facility] but did not get an answer. Will attempt to try again before ambulance service arrives.

## 2014-12-03 NOTE — Progress Notes (Signed)
3rd attempt to give report to [facility]. Gave report to [employer]; verbalized understanding. IV removed without issue. Pt leaving unit via ambulance service. Pt information packet given to transporters.

## 2014-12-03 NOTE — Progress Notes (Signed)
Patient arrived on unit via stretcher with nurse tech. Patient alert and oriented x4. Patient oriented to unit, staff and room. Skin assessment completed with Tempie Donning, RN, See flow sheet. Patient's IV clean, dry and intact. Pain denies having any pain. Safety Fall Prevention Plan was given, discussed and signed by patient. Patient's significant other at the bedside. Orders have been reviewed and implemented. Call light has been placed within reach. RN will continue to monitor the patient.  Rivka Barbara BSN, RN  Phone Number: [phone]

## 2014-12-03 NOTE — Discharge Summary (Addendum)
Physician Discharge Summary  [name] WUJ:[id number] DOB: [birth year] DOA: 12/02/2014  PCP: [name], NP  Admit date: 12/02/2014 Discharge date: 12/03/2014  Time spent: 25* minutes  Recommendations for Outpatient Follow-up:  1. Follow up hemodialysis as outpatient on 12/04/14  Discharge Diagnoses:  Principal Problem:   Symptomatic anemia Active Problems:   HTN (hypertension)   Hyperlipidemia   GERD (gastroesophageal reflux disease)   Chronic combined systolic and diastolic CHF (congestive heart failure)   Diabetes type 2, controlled   CKD (chronic kidney disease) stage V requiring chronic dialysis   Anemia   Discharge Condition: stable  Diet recommendation: low salt diet  Filed Weights   12/02/14 1936 12/03/14 0120  Weight: 71.668 kg (158 lb) 66.996 kg (147 lb 11.2 oz)    History of present illness:  49 y.o. 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 The patient had a AKA done on June 13 and was discharged on August 5 to a rehab facility. Patient had lab drawn at the facility and was noted to have a low hemoglobin. Patient has been undergoing regular dialysis and his last Hgb was noted to be around 7.2 grams. Patient here in the ED had a Hgb of 7 gram. He does not have any chest pain noted. He does have some SOB with exertion though not very pronounced. He has no black stools and he has not had any bleeding from the wound. He has no hematemesis noted. He is on chronic coumadin therapy however and his INR was noted to be 1.63 Hospital Course:   1. Symptomatic Anemia Patient presented with anemia, received 1 blood transfusion This morning hemoglobin is 7.9 which is close to his baseline 8.0  2. HTN - stable  3.CHF -currently compensated will monitor  4. 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  5. Diabetes Mellitus Type 2 - stable  H/o A fib - Continue with amiodarone,  coumadin. Procedures:  None  Consultations:  none  Discharge Exam: Filed Vitals:   12/03/14 0959  BP: 105/56  Pulse: 66  Temp: 98.2 F (36.8 C)  Resp: 18    General: Appear in no acute distress Cardiovascular: S1S2 RRR Respiratory: Clear bilaterally  Discharge Instructions   Discharge Instructions    Diet - low sodium heart healthy    Complete by:  As directed      Increase activity slowly    Complete by:  As directed           Current Discharge Medication List    CONTINUE these medications which have CHANGED   Details  OxyCODONE (OXYCONTIN) 15 mg T12A 12 hr tablet Take one tablet by mouth every 12 hours for pain. Do not crush Qty: 10 tablet, Refills: 0    Oxycodone HCl 10 MG TABS Take one tablet by mouth every 4 hours as needed for breakthrough pain Qty: 30 tablet, Refills: 0      CONTINUE these medications which have NOT CHANGED   Details  Amino Acids-Protein Hydrolys (FEEDING SUPPLEMENT, PRO-STAT SUGAR FREE 64,) LIQD Take 30 mLs by mouth 2 (two) times daily. Qty: 900 mL, Refills: 0    amiodarone (PACERONE) 400 MG tablet Take 1 tablet (400 mg total) by mouth 2 (two) times daily. Qty: 60 tablet, Refills: 0    calcium acetate (PHOSLO) 667 MG capsule Take 2 capsules (1,334 mg total) by mouth 3 (three) times daily with meals. Qty: 90 capsule, Refills: 0  Darbepoetin Alfa (ARANESP) 100 MCG/0.5ML SOSY injection Inject 0.5 mLs (100 mcg total) into the vein every Tuesday with hemodialysis. Qty: 4.2 mL, Refills: 0    Ferrous Sulfate (IRON) 325 (65 FE) MG TABS Take 325 mg by mouth daily.    folic acid (FOLVITE) 1 MG tablet Take 1 tablet (1 mg total) by mouth daily. Qty: 30 tablet, Refills: 0    insulin aspart (NOVOLOG) 100 UNIT/ML injection Inject 0-9 Units into the skin 3 (three) times daily with meals. Before each meal 3 times a day, 140-199 - 2 units, 200-250 - 4 units, 251-299 - 6 units,  300-349 - 8 units,  350 or above 10 units. Insulin PEN if  approved, provide syringes and needles if needed. Qty: 10 mL, Refills: 11    Multiple Vitamins-Minerals (DECUBI-VITE PO) Take 1 tablet by mouth daily.    nitroGLYCERIN (NITROSTAT) 0.4 MG SL tablet Place 1 tablet (0.4 mg total) under the tongue once. Qty: 25 tablet, Refills: 5    Nutritional Supplements (FEEDING SUPPLEMENT, NEPRO CARB STEADY,) LIQD Take 237 mLs by mouth 2 (two) times daily between meals. Qty: 30 Can, Refills: 0    polyethylene glycol (MIRALAX / GLYCOLAX) packet Take 17 g by mouth daily as needed for moderate constipation. Qty: 14 each, Refills: 0    senna-docusate (SENOKOT-S) 8.6-50 MG per tablet Take 1 tablet by mouth 2 (two) times daily. Qty: 30 tablet, Refills: 0    warfarin (COUMADIN) 1 MG tablet Take 0.5-1 mg by mouth daily at 6 PM. Take 1 mg on Mon-Wed-Fri-Sat and 0.5 mg on Tues-Thurs-Sun    midodrine (PROAMATINE) 10 MG tablet Take 1 tablet (10 mg total) by mouth Every Tuesday,Thursday,and Saturday with dialysis. Qty: 30 tablet, Refills: 0    pantoprazole (PROTONIX) 40 MG tablet Take 40 mg by mouth daily.       STOP taking these medications     Melatonin 3 MG TABS        No Known Allergies    The results of significant diagnostics from this hospitalization (including imaging, microbiology, ancillary and laboratory) are listed below for reference.    Significant Diagnostic Studies: No results found.  Microbiology: Recent Results (from the past 240 hour(s))  MRSA PCR Screening     Status: None   Collection Time: 12/03/14  2:34 AM  Result Value Ref Range Status   MRSA by PCR NEGATIVE NEGATIVE Final    Comment:        The GeneXpert MRSA Assay (FDA approved for NASAL specimens only), is one component of a comprehensive MRSA colonization surveillance program. It is not intended to diagnose MRSA infection nor to guide or monitor treatment for MRSA infections.      Labs: Basic Metabolic Panel:  Recent Labs Lab 11/27/14 0417 11/28/14 0343  12/02/14 1956 12/03/14 1153  NA 135 137 139 135  K 4.1 3.8 4.5 5.2*  CL 95* 96* 97* 96*  CO2 24 27 29 24   GLUCOSE 141* 156* 65 178*  BUN 45* 23* 23* 32*  CREATININE 4.71* 2.92* 3.13* 3.74*  CALCIUM 8.1* 8.3* 8.8* 8.5*   Liver Function Tests: No results for input(s): AST, ALT, ALKPHOS, BILITOT, PROT, ALBUMIN in the last 168 hours. No results for input(s): LIPASE, AMYLASE in the last 168 hours. No results for input(s): AMMONIA in the last 168 hours. CBC:  Recent Labs Lab 11/27/14 0417 11/28/14 0343 12/02/14 1956 12/03/14 1153  WBC 8.0 9.9 9.1 11.0*  NEUTROABS  --   --  7.8*  --  HGB 7.6* 7.2* 7.0* 7.9*  HCT 24.1* 22.8* 22.7* 25.0*  MCV 93.1 94.2 96.6 94.3  PLT 97* 116* 135* 125*   Cardiac Enzymes: No results for input(s): CKTOTAL, CKMB, CKMBINDEX, TROPONINI in the last 168 hours. BNP: BNP (last 3 results)  Recent Labs  09/25/14 0410  BNP 2227.0*    ProBNP (last 3 results) No results for input(s): PROBNP in the last 8760 hours.  CBG:  Recent Labs Lab 11/28/14 0556 11/28/14 1059 12/03/14 0055 12/03/14 0746 12/03/14 1139  GLUCAP 152* 140* 114* 114* 183*       Signed:  Ashby Leflore S  [facility] 12/03/2014, 4:35 PM

## 2014-12-08 ENCOUNTER — Encounter (HOSPITAL_COMMUNITY): Payer: Self-pay | Admitting: *Deleted

## 2014-12-08 ENCOUNTER — Emergency Department (HOSPITAL_COMMUNITY): Payer: Medicare Other

## 2014-12-08 ENCOUNTER — Inpatient Hospital Stay (HOSPITAL_COMMUNITY)
Admission: EM | Admit: 2014-12-08 | Discharge: 2014-12-15 | DRG: 299 | Disposition: A | Discharge status: Skilled Nursing Facility | Payer: Medicare Other | Attending: Vascular Surgery | Admitting: Vascular Surgery

## 2014-12-08 ENCOUNTER — Other Ambulatory Visit: Payer: Self-pay

## 2014-12-08 DIAGNOSIS — L97529 Non-pressure chronic ulcer of other part of left foot with unspecified severity: Secondary | ICD-10-CM | POA: Diagnosis present

## 2014-12-08 DIAGNOSIS — E43 Unspecified severe protein-calorie malnutrition: Secondary | ICD-10-CM | POA: Diagnosis present

## 2014-12-08 DIAGNOSIS — Z89511 Acquired absence of right leg below knee: Secondary | ICD-10-CM

## 2014-12-08 DIAGNOSIS — I429 Cardiomyopathy, unspecified: Secondary | ICD-10-CM | POA: Diagnosis present

## 2014-12-08 DIAGNOSIS — Z951 Presence of aortocoronary bypass graft: Secondary | ICD-10-CM | POA: Diagnosis not present

## 2014-12-08 DIAGNOSIS — D649 Anemia, unspecified: Secondary | ICD-10-CM | POA: Diagnosis present

## 2014-12-08 DIAGNOSIS — Z794 Long term (current) use of insulin: Secondary | ICD-10-CM | POA: Diagnosis not present

## 2014-12-08 DIAGNOSIS — I509 Heart failure, unspecified: Secondary | ICD-10-CM | POA: Diagnosis present

## 2014-12-08 DIAGNOSIS — E213 Hyperparathyroidism, unspecified: Secondary | ICD-10-CM | POA: Diagnosis present

## 2014-12-08 DIAGNOSIS — R64 Cachexia: Secondary | ICD-10-CM | POA: Diagnosis present

## 2014-12-08 DIAGNOSIS — M7989 Other specified soft tissue disorders: Secondary | ICD-10-CM | POA: Diagnosis not present

## 2014-12-08 DIAGNOSIS — R188 Other ascites: Secondary | ICD-10-CM | POA: Diagnosis present

## 2014-12-08 DIAGNOSIS — I120 Hypertensive chronic kidney disease with stage 5 chronic kidney disease or end stage renal disease: Secondary | ICD-10-CM | POA: Diagnosis present

## 2014-12-08 DIAGNOSIS — N2581 Secondary hyperparathyroidism of renal origin: Secondary | ICD-10-CM | POA: Diagnosis present

## 2014-12-08 DIAGNOSIS — E1122 Type 2 diabetes mellitus with diabetic chronic kidney disease: Secondary | ICD-10-CM | POA: Diagnosis present

## 2014-12-08 DIAGNOSIS — Z6820 Body mass index (BMI) 20.0-20.9, adult: Secondary | ICD-10-CM | POA: Diagnosis not present

## 2014-12-08 DIAGNOSIS — E1152 Type 2 diabetes mellitus with diabetic peripheral angiopathy with gangrene: Secondary | ICD-10-CM | POA: Diagnosis present

## 2014-12-08 DIAGNOSIS — Z992 Dependence on renal dialysis: Secondary | ICD-10-CM

## 2014-12-08 DIAGNOSIS — I2510 Atherosclerotic heart disease of native coronary artery without angina pectoris: Secondary | ICD-10-CM | POA: Diagnosis present

## 2014-12-08 DIAGNOSIS — D72829 Elevated white blood cell count, unspecified: Secondary | ICD-10-CM | POA: Diagnosis present

## 2014-12-08 DIAGNOSIS — D539 Nutritional anemia, unspecified: Secondary | ICD-10-CM | POA: Diagnosis present

## 2014-12-08 DIAGNOSIS — I959 Hypotension, unspecified: Secondary | ICD-10-CM | POA: Diagnosis not present

## 2014-12-08 DIAGNOSIS — Z7901 Long term (current) use of anticoagulants: Secondary | ICD-10-CM

## 2014-12-08 DIAGNOSIS — R011 Cardiac murmur, unspecified: Secondary | ICD-10-CM | POA: Diagnosis present

## 2014-12-08 DIAGNOSIS — I70262 Atherosclerosis of native arteries of extremities with gangrene, left leg: Secondary | ICD-10-CM | POA: Diagnosis present

## 2014-12-08 DIAGNOSIS — I4891 Unspecified atrial fibrillation: Secondary | ICD-10-CM | POA: Diagnosis present

## 2014-12-08 DIAGNOSIS — N186 End stage renal disease: Secondary | ICD-10-CM | POA: Diagnosis present

## 2014-12-08 DIAGNOSIS — Z89611 Acquired absence of right leg above knee: Secondary | ICD-10-CM | POA: Diagnosis not present

## 2014-12-08 DIAGNOSIS — Z87891 Personal history of nicotine dependence: Secondary | ICD-10-CM

## 2014-12-08 DIAGNOSIS — S81809A Unspecified open wound, unspecified lower leg, initial encounter: Secondary | ICD-10-CM | POA: Diagnosis present

## 2014-12-08 DIAGNOSIS — M79662 Pain in left lower leg: Secondary | ICD-10-CM

## 2014-12-08 DIAGNOSIS — M79604 Pain in right leg: Secondary | ICD-10-CM | POA: Diagnosis present

## 2014-12-08 DIAGNOSIS — I70245 Atherosclerosis of native arteries of left leg with ulceration of other part of foot: Secondary | ICD-10-CM | POA: Diagnosis not present

## 2014-12-08 LAB — CBC WITH DIFFERENTIAL/PLATELET
BASOS ABS: 0 10*3/uL (ref 0.0–0.1)
BASOS PCT: 0 % (ref 0–1)
Eosinophils Absolute: 0 10*3/uL (ref 0.0–0.7)
Eosinophils Relative: 0 % (ref 0–5)
HEMATOCRIT: 25.2 % — AB (ref 39.0–52.0)
HEMOGLOBIN: 7.9 g/dL — AB (ref 13.0–17.0)
Lymphocytes Relative: 3 % — ABNORMAL LOW (ref 12–46)
Lymphs Abs: 0.4 10*3/uL — ABNORMAL LOW (ref 0.7–4.0)
MCH: 30 pg (ref 26.0–34.0)
MCHC: 31.3 g/dL (ref 30.0–36.0)
MCV: 95.8 fL (ref 78.0–100.0)
MONOS PCT: 6 % (ref 3–12)
Monocytes Absolute: 1 10*3/uL (ref 0.1–1.0)
NEUTROS ABS: 14.5 10*3/uL — AB (ref 1.7–7.7)
NEUTROS PCT: 91 % — AB (ref 43–77)
Platelets: 151 10*3/uL (ref 150–400)
RBC: 2.63 MIL/uL — ABNORMAL LOW (ref 4.22–5.81)
RDW: 18.9 % — ABNORMAL HIGH (ref 11.5–15.5)
WBC: 15.9 10*3/uL — ABNORMAL HIGH (ref 4.0–10.5)

## 2014-12-08 LAB — URINALYSIS, ROUTINE W REFLEX MICROSCOPIC
Bilirubin Urine: NEGATIVE
GLUCOSE, UA: 100 mg/dL — AB
HGB URINE DIPSTICK: NEGATIVE
Ketones, ur: NEGATIVE mg/dL
Nitrite: NEGATIVE
Protein, ur: >300 mg/dL — AB
SPECIFIC GRAVITY, URINE: 1.014 (ref 1.005–1.030)
Urobilinogen, UA: 0.2 mg/dL (ref 0.0–1.0)
pH: 7.5 (ref 5.0–8.0)

## 2014-12-08 LAB — COMPREHENSIVE METABOLIC PANEL
ALK PHOS: 97 U/L (ref 38–126)
ALT: 17 U/L (ref 17–63)
ANION GAP: 13 (ref 5–15)
AST: 18 U/L (ref 15–41)
Albumin: 2.1 g/dL — ABNORMAL LOW (ref 3.5–5.0)
BUN: 55 mg/dL — ABNORMAL HIGH (ref 6–20)
CALCIUM: 8.8 mg/dL — AB (ref 8.9–10.3)
CO2: 27 mmol/L (ref 22–32)
Chloride: 93 mmol/L — ABNORMAL LOW (ref 101–111)
Creatinine, Ser: 4.83 mg/dL — ABNORMAL HIGH (ref 0.61–1.24)
GFR calc non Af Amer: 13 mL/min — ABNORMAL LOW (ref >60)
GFR, EST AFRICAN AMERICAN: 15 mL/min — AB (ref >60)
Glucose, Bld: 146 mg/dL — ABNORMAL HIGH (ref 65–99)
POTASSIUM: 5.2 mmol/L — AB (ref 3.5–5.1)
SODIUM: 133 mmol/L — AB (ref 135–145)
Total Bilirubin: 0.9 mg/dL (ref 0.3–1.2)
Total Protein: 5.5 g/dL — ABNORMAL LOW (ref 6.5–8.1)

## 2014-12-08 LAB — URINE MICROSCOPIC-ADD ON

## 2014-12-08 LAB — PROTIME-INR
INR: 2.16 — ABNORMAL HIGH (ref 0.00–1.49)
PROTHROMBIN TIME: 24 s — AB (ref 11.6–15.2)

## 2014-12-08 LAB — APTT: aPTT: 53 seconds — ABNORMAL HIGH (ref 24–37)

## 2014-12-08 LAB — I-STAT CG4 LACTIC ACID, ED
LACTIC ACID, VENOUS: 0.92 mmol/L (ref 0.5–2.0)
Lactic Acid, Venous: 0.95 mmol/L (ref 0.5–2.0)

## 2014-12-08 LAB — GLUCOSE, CAPILLARY: Glucose-Capillary: 107 mg/dL — ABNORMAL HIGH (ref 65–99)

## 2014-12-08 LAB — PREALBUMIN: Prealbumin: 7.2 mg/dL — ABNORMAL LOW (ref 18–38)

## 2014-12-08 LAB — FIBRINOGEN: FIBRINOGEN: 510 mg/dL — AB (ref 204–475)

## 2014-12-08 MED ORDER — RENA-VITE PO TABS
1.0000 | ORAL_TABLET | Freq: Every day | ORAL | Status: DC
  Administered 2014-12-08 – 2014-12-14 (×7): 1 via ORAL
  Filled 2014-12-08 (×7): qty 1

## 2014-12-08 MED ORDER — METOPROLOL TARTRATE 1 MG/ML IV SOLN: 2.0000 mg | INTRAVENOUS | Status: DC | PRN

## 2014-12-08 MED ORDER — MIDODRINE HCL 5 MG PO TABS
10.0000 mg | ORAL_TABLET | ORAL | Status: DC
  Administered 2014-12-09 – 2014-12-13 (×3): 10 mg via ORAL
  Filled 2014-12-08 (×5): qty 2

## 2014-12-08 MED ORDER — OXYCODONE HCL 5 MG PO TABS
10.0000 mg | ORAL_TABLET | Freq: Four times a day (QID) | ORAL | Status: DC | PRN
  Administered 2014-12-09 – 2014-12-15 (×15): 10 mg via ORAL
  Filled 2014-12-08 (×14): qty 2

## 2014-12-08 MED ORDER — FERROUS SULFATE 325 (65 FE) MG PO TABS
325.0000 mg | ORAL_TABLET | Freq: Every day | ORAL | Status: DC
  Administered 2014-12-10 – 2014-12-12 (×3): 325 mg via ORAL
  Filled 2014-12-08 (×6): qty 1

## 2014-12-08 MED ORDER — NITROGLYCERIN 0.4 MG SL SUBL: 0.4000 mg | SUBLINGUAL_TABLET | SUBLINGUAL | Status: DC | PRN

## 2014-12-08 MED ORDER — HEPARIN SODIUM (PORCINE) 5000 UNIT/ML IJ SOLN
5000.0000 [IU] | Freq: Three times a day (TID) | INTRAMUSCULAR | Status: DC
  Administered 2014-12-09 – 2014-12-10 (×3): 5000 [IU] via SUBCUTANEOUS
  Filled 2014-12-08 (×9): qty 1

## 2014-12-08 MED ORDER — ALUM & MAG HYDROXIDE-SIMETH 200-200-20 MG/5ML PO SUSP
15.0000 mL | ORAL | Status: DC | PRN
  Administered 2014-12-11 (×2): 30 mL via ORAL
  Filled 2014-12-08 (×2): qty 30

## 2014-12-08 MED ORDER — GUAIFENESIN-DM 100-10 MG/5ML PO SYRP: 15.0000 mL | ORAL_SOLUTION | ORAL | Status: DC | PRN

## 2014-12-08 MED ORDER — DARBEPOETIN ALFA 100 MCG/0.5ML IJ SOSY: 100.0000 ug | PREFILLED_SYRINGE | INTRAMUSCULAR | Status: DC

## 2014-12-08 MED ORDER — AMIODARONE HCL 200 MG PO TABS
400.0000 mg | ORAL_TABLET | Freq: Two times a day (BID) | ORAL | Status: DC
  Administered 2014-12-08 – 2014-12-15 (×14): 400 mg via ORAL
  Filled 2014-12-08 (×17): qty 2

## 2014-12-08 MED ORDER — NEPRO/CARBSTEADY PO LIQD
237.0000 mL | Freq: Three times a day (TID) | ORAL | Status: DC
  Administered 2014-12-10 (×2): 237 mL via ORAL
  Filled 2014-12-08 (×13): qty 237

## 2014-12-08 MED ORDER — MORPHINE SULFATE (PF) 2 MG/ML IV SOLN
2.0000 mg | INTRAVENOUS | Status: DC | PRN
  Administered 2014-12-09 – 2014-12-14 (×4): 2 mg via INTRAVENOUS
  Administered 2014-12-14: 4 mg via INTRAVENOUS
  Administered 2014-12-14 (×2): 2 mg via INTRAVENOUS
  Administered 2014-12-15: 4 mg via INTRAVENOUS
  Filled 2014-12-08: qty 2
  Filled 2014-12-08 (×4): qty 1
  Filled 2014-12-08: qty 2
  Filled 2014-12-08 (×2): qty 1

## 2014-12-08 MED ORDER — FOLIC ACID 1 MG PO TABS
1.0000 mg | ORAL_TABLET | Freq: Every day | ORAL | Status: DC
Start: 2014-12-09 — End: 2014-12-13
  Administered 2014-12-09 – 2014-12-12 (×4): 1 mg via ORAL
  Filled 2014-12-08 (×6): qty 1

## 2014-12-08 MED ORDER — HYDRALAZINE HCL 20 MG/ML IJ SOLN: 5.0000 mg | INTRAMUSCULAR | Status: DC | PRN

## 2014-12-08 MED ORDER — INSULIN ASPART 100 UNIT/ML ~~LOC~~ SOLN
0.0000 [IU] | Freq: Three times a day (TID) | SUBCUTANEOUS | Status: DC
  Administered 2014-12-10 – 2014-12-11 (×3): 2 [IU] via SUBCUTANEOUS
  Administered 2014-12-12 (×2): 1 [IU] via SUBCUTANEOUS
  Administered 2014-12-12: 2 [IU] via SUBCUTANEOUS
  Administered 2014-12-13 – 2014-12-15 (×6): 1 [IU] via SUBCUTANEOUS

## 2014-12-08 MED ORDER — VANCOMYCIN HCL IN DEXTROSE 750-5 MG/150ML-% IV SOLN
750.0000 mg | INTRAVENOUS | Status: DC
  Administered 2014-12-09 – 2014-12-13 (×3): 750 mg via INTRAVENOUS
  Filled 2014-12-08 (×5): qty 150

## 2014-12-08 MED ORDER — VANCOMYCIN HCL 10 G IV SOLR
1500.0000 mg | Freq: Once | INTRAVENOUS | Status: AC
  Administered 2014-12-08: 1500 mg via INTRAVENOUS
  Filled 2014-12-08: qty 1500

## 2014-12-08 MED ORDER — PANTOPRAZOLE SODIUM 40 MG PO TBEC
40.0000 mg | DELAYED_RELEASE_TABLET | Freq: Every day | ORAL | Status: DC
  Administered 2014-12-09 – 2014-12-15 (×7): 40 mg via ORAL
  Filled 2014-12-08 (×5): qty 1

## 2014-12-08 MED ORDER — CALCIUM ACETATE (PHOS BINDER) 667 MG PO CAPS
1334.0000 mg | ORAL_CAPSULE | Freq: Three times a day (TID) | ORAL | Status: DC
  Administered 2014-12-08 – 2014-12-10 (×5): 1334 mg via ORAL
  Filled 2014-12-08 (×8): qty 2

## 2014-12-08 MED ORDER — PHENOL 1.4 % MT LIQD
1.0000 | OROMUCOSAL | Status: DC | PRN
  Filled 2014-12-08: qty 177

## 2014-12-08 MED ORDER — DARBEPOETIN ALFA 200 MCG/0.4ML IJ SOSY
200.0000 ug | PREFILLED_SYRINGE | INTRAMUSCULAR | Status: DC
  Administered 2014-12-09: 200 ug via INTRAVENOUS
  Filled 2014-12-08: qty 0.4

## 2014-12-08 MED ORDER — SODIUM CHLORIDE 0.9 % IV SOLN
125.0000 mg | INTRAVENOUS | Status: DC
  Administered 2014-12-09: 125 mg via INTRAVENOUS
  Filled 2014-12-08 (×2): qty 10

## 2014-12-08 MED ORDER — PIPERACILLIN-TAZOBACTAM IN DEX 2-0.25 GM/50ML IV SOLN
2.2500 g | Freq: Three times a day (TID) | INTRAVENOUS | Status: DC
  Administered 2014-12-08 – 2014-12-15 (×18): 2.25 g via INTRAVENOUS
  Filled 2014-12-08 (×25): qty 50

## 2014-12-08 MED ORDER — ONDANSETRON HCL 4 MG/2ML IJ SOLN: 4.0000 mg | Freq: Four times a day (QID) | INTRAMUSCULAR | Status: DC | PRN

## 2014-12-08 MED ORDER — OXYCODONE HCL ER 15 MG PO T12A
15.0000 mg | EXTENDED_RELEASE_TABLET | Freq: Two times a day (BID) | ORAL | Status: DC
  Administered 2014-12-08 – 2014-12-15 (×14): 15 mg via ORAL
  Filled 2014-12-08 (×14): qty 1

## 2014-12-08 MED ORDER — OXYCODONE HCL 10 MG PO TABS: 10.0000 mg | ORAL_TABLET | Freq: Four times a day (QID) | ORAL | Status: DC | PRN

## 2014-12-08 MED ORDER — LABETALOL HCL 5 MG/ML IV SOLN
10.0000 mg | INTRAVENOUS | Status: DC | PRN
  Filled 2014-12-08: qty 4

## 2014-12-08 MED ORDER — CALCIUM ACETATE (PHOS BINDER) 667 MG PO CAPS
1334.0000 mg | ORAL_CAPSULE | Freq: Three times a day (TID) | ORAL | Status: DC
  Administered 2014-12-10 – 2014-12-15 (×14): 1334 mg via ORAL
  Filled 2014-12-08 (×21): qty 2

## 2014-12-08 NOTE — ED Notes (Signed)
Pt presents via [facility] from [facility] for increase in right leg pain, decreased pulse in left foot and increase in cap refill in left foot.  Pt had revision to right AKA on 6th, pt also has pressure sores to left left and injury to left toe, dressing intact on arrival.  Right stump wrapped in ace bandage, CDI.  Pt denies increase in pain, reports it has stayed the same since surgery.  Pt dialysis TTS, L arm fistula with bandage d/t cyst.  Pt denies fever. Pt a x 4, NAD

## 2014-12-08 NOTE — ED Provider Notes (Signed)
The patient is a 49 year old male, he presents with a complaint of left leg mild discomfort, he states it has changed appearance with increased purulence, increased rash, increased swelling and redness. He denies shortness of breath fevers chills nausea or vomiting. On exam the patient has had a recent amputation on the right, his left lower extremity appears to have significant vascular insufficiency. He has minimal capillary refill at the toes, they are pink, he does have multiple gangrenous toes and the dorsum of his foot appears gangrenous as well. There are multiple spots on the anterior shin which also appear to have vascular insufficiency and new infections and ulcers.  Medical screening examination/treatment/procedure(s) were conducted as a shared visit with non-physician practitioner(s) and myself.  I personally evaluated the patient during the encounter.  Clinical Impression:   Final diagnoses:  Pain and swelling of lower leg, left         Eber Hong, MD 12/10/14 [phone]

## 2014-12-08 NOTE — ED Notes (Signed)
MD Fields at bedside

## 2014-12-08 NOTE — ED Notes (Signed)
Pt continues to deny SI/HI, states he got frustrated and upset at [facility] place when he stated something regarding SI.  PA aware

## 2014-12-08 NOTE — Consult Note (Signed)
[facility] Renal Consultation Note  Indication for Consultation:  Management of ESRD/hemodialysis; anemia, hypertension/volume and secondary hyperparathyroidism  HPI: [name] is a 49 y.o. male sent to ER from [facility] to evaluate L Foot wounds. HO DM type 2 / ESRD on  HD Adm [facility] TTS  On schedule last HD sat  12/06/14 . Recent  Admit =R AKA wound dehiscence  and ^^ INR ( coumadin for HO A. Fib). 12/04/14 admit for Anemia  hgb was 6.3 as op recheck after dc related to ESRD/ infection / Surgery  2 u prbcs given and 7.9 hgb  12/04/14. Admits to dragging foot some when ambulating with Wheelchair . At kidney center [facility] lifts transfers. Denying fevers, cp , sob, cough, N/V /D.    Past Medical History  Diagnosis Date  . Coronary artery disease 07/05/2011    2D ECHO - EF ~40%, moderate concentric LV hypertrophy, LA moderately dilated, mild-moderate septal and inferior wall hypokinesis  . Hypertension   . CHF (congestive heart failure)   . Diabetes mellitus     Insulin dependent  . Cardiomyopathy   . Claudication 05/09/2011    Right and left anterior tibial arteries and Left SFA-occluded; Right CIA-<50% diameter reduction; Right Deep Profunda-70-99% diameter reduction; Right SFA->60% diameter reduction; Right Distal Popliteal/Tibial Artery- >60% diameter reduction; Left CFA and Profunda- >50% diameter reduction  . 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 GERD (gastroesophageal reflux disease)   . Peripheral vascular disease   . Chronic kidney disease (CKD), stage IV (severe)   . 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 Cardiac catheterization  05/07/2008    CABG  . Coronary artery bypass graft  06/2008    x3   . Insertion of dialysis catheter Right 10/01/2014    Procedure: INSERTION OF RIGHT INTERNAL JUGULAR DIALYSIS CATHETER;  Surgeon: Larina Earthly, MD;  Location: [facility] OR;  Service: Vascular;  Laterality: Right;  . Amputation Right 10/06/2014    Procedure: AMPUTATION ABOVE KNEE Right;  Surgeon: Sherren Kerns, MD;  Location: [facility] OR;  Service: Vascular;  Laterality: Right;  . Av fistula placement Left 10/10/2014    Procedure: LEFT BRACHIAL TO CEPHALIC ARTERIOVENOUS (AV) FISTULA CREATION;  Surgeon: Larina Earthly, MD;  Location: [facility] OR;  Service: Vascular;  Laterality: Left;  . Amputation Right 11/26/2014    Procedure: REVISION AMPUTATION ABOVE KNEE;  Surgeon: Sherren Kerns, MD;  Location: [facility] OR;  Service: Vascular;  Laterality: Right;      Family History  Problem Relation Age of Onset  . Heart disease Mother   . Hypertension Mother   . Diabetes Mother   . Arthritis Sister   . Diabetes Sister       reports that he quit smoking about 15 months ago. His smoking use included Cigarettes. He has a 12.5 pack-year smoking history. He has never used smokeless tobacco. He reports that he drinks alcohol. He reports that he uses illicit drugs (Marijuana).  No Known Allergies  Prior to Admission medications   Medication Sig Start Date End Date Taking? Authorizing Provider  Amino Acids-Protein Hydrolys (FEEDING SUPPLEMENT, PRO-STAT SUGAR FREE 64,) LIQD Take 30 mLs by mouth 2 (two) times daily. 10/17/14  Yes Parke Poisson  Roda Shutters, MD  amiodarone (PACERONE) 400 MG tablet Take 1 tablet (400 mg total) by mouth 2 (two) times daily. 10/17/14  Yes Albertine Grates, MD  calcium acetate (PHOSLO) 667 MG capsule Take 2 capsules (1,334 mg total) by mouth 3 (three) times daily with meals. 10/17/14  Yes Albertine Grates, MD  Darbepoetin Alfa (ARANESP) 100 MCG/0.5ML SOSY injection Inject 0.5 mLs (100 mcg total) into the vein every Tuesday with hemodialysis. 10/17/14  Yes Albertine Grates, MD  Ferrous Sulfate (IRON) 325 (65 FE) MG TABS Take 325 mg by mouth daily.   Yes  Historical Provider, MD  folic acid (FOLVITE) 1 MG tablet Take 1 tablet (1 mg total) by mouth daily. 10/17/14  Yes Albertine Grates, MD  Melatonin 3 MG TABS Take 3 mg by mouth at bedtime.   Yes Historical Provider, MD  midodrine (PROAMATINE) 10 MG tablet Take 1 tablet (10 mg total) by mouth Every Tuesday,Thursday,and Saturday with dialysis. 10/18/14  Yes Albertine Grates, MD  Multiple Vitamins-Minerals (DECUBI-VITE PO) Take 1 tablet by mouth daily.   Yes Historical Provider, MD  nitroGLYCERIN (NITROSTAT) 0.4 MG SL tablet Place 1 tablet (0.4 mg total) under the tongue once. Patient taking differently: Place 0.4 mg under the tongue every 5 (five) minutes as needed for chest pain.  10/01/13  Yes Mihai Croitoru, MD  Nutritional Supplements (FEEDING SUPPLEMENT, NEPRO CARB STEADY,) LIQD Take 237 mLs by mouth 2 (two) times daily between meals. 10/17/14  Yes Albertine Grates, MD  OxyCODONE (OXYCONTIN) 15 mg T12A 12 hr tablet Take one tablet by mouth every 12 hours for pain. Do not crush 12/03/14  Yes Meredeth Ide, MD  Oxycodone HCl 10 MG TABS Take one tablet by mouth every 4 hours as needed for breakthrough pain 12/03/14  Yes Meredeth Ide, MD  pantoprazole (PROTONIX) 40 MG tablet Take 40 mg by mouth daily.  05/08/13  Yes Historical Provider, MD  polyethylene glycol (MIRALAX / GLYCOLAX) packet Take 17 g by mouth daily as needed for moderate constipation. 10/17/14  Yes Albertine Grates, MD  senna-docusate (SENOKOT-S) 8.6-50 MG per tablet Take 1 tablet by mouth 2 (two) times daily. 10/17/14  Yes Albertine Grates, MD  vancomycin (VANCOCIN) 125 MG capsule Take 125 mg by mouth 4 (four) times daily.   Yes Historical Provider, MD  warfarin (COUMADIN) 1 MG tablet Take 0.5-1 mg by mouth daily at 6 PM. Take 1 mg on Mon-Wed-Fri-Sat-Sun, and 0.5 mg on Tues-Thurs 11/18/14  Yes Historical Provider, MD  insulin aspart (NOVOLOG) 100 UNIT/ML injection Inject 0-9 Units into the skin 3 (three) times daily with meals. Before each meal 3 times a day, 140-199 - 2 units, 200-250 - 4  units, 251-299 - 6 units,  300-349 - 8 units,  350 or above 10 units. Insulin PEN if approved, provide syringes and needles if needed. Patient taking differently: Inject 0-10 Units into the skin 4 (four) times daily -  with meals and at bedtime. 140-199 - 2 units, 200-250 - 4 units, 251-299 - 6 units,  300-349 - 8 units,  350 or above 10 units. Insulin PEN if approved, provide syringes and needles if needed 10/18/14   Albertine Grates, MD     Anti-infectives    Start     Dose/Rate Route Frequency Ordered Stop   12/09/14 1200  vancomycin (VANCOCIN) IVPB 750 mg/150 ml premix     750 mg 150 mL/hr over 60 Minutes Intravenous Every T-Th-Sa (Hemodialysis) 12/08/14 1504     12/08/14 1400  piperacillin-tazobactam (ZOSYN) IVPB  2.25 g     2.25 g 100 mL/hr over 30 Minutes Intravenous 3 times per day 12/08/14 1336     12/08/14 1345  vancomycin (VANCOCIN) 1,500 mg in sodium chloride 0.9 % 500 mL IVPB     1,500 mg 250 mL/hr over 120 Minutes Intravenous  Once 12/08/14 1336        Results for orders placed or performed during the hospital encounter of 12/08/14 (from the past 48 hour(s))  CBC WITH DIFFERENTIAL     Status: Abnormal   Collection Time: 12/08/14  1:38 PM  Result Value Ref Range   WBC 15.9 (H) 4.0 - 10.5 K/uL   RBC 2.63 (L) 4.22 - 5.81 MIL/uL   Hemoglobin 7.9 (L) 13.0 - 17.0 g/dL   HCT 96.0 (L) 45.4 - 09.8 %   MCV 95.8 78.0 - 100.0 fL   MCH 30.0 26.0 - 34.0 pg   MCHC 31.3 30.0 - 36.0 g/dL   RDW 11.9 (H) 14.7 - 82.9 %   Platelets 151 150 - 400 K/uL   Neutrophils Relative % 91 (H) 43 - 77 %   Neutro Abs 14.5 (H) 1.7 - 7.7 K/uL   Lymphocytes Relative 3 (L) 12 - 46 %   Lymphs Abs 0.4 (L) 0.7 - 4.0 K/uL   Monocytes Relative 6 3 - 12 %   Monocytes Absolute 1.0 0.1 - 1.0 K/uL   Eosinophils Relative 0 0 - 5 %   Eosinophils Absolute 0.0 0.0 - 0.7 K/uL   Basophils Relative 0 0 - 1 %   Basophils Absolute 0.0 0.0 - 0.1 K/uL  I-Stat CG4 Lactic Acid, ED  (not at  [facility])     Status: None   Collection  Time: 12/08/14  1:42 PM  Result Value Ref Range   Lactic Acid, Venous 0.92 0.5 - 2.0 mmol/L     ROS: see hpi only positives  Physical Exam: Filed Vitals:   12/08/14 1400  BP: 131/65  Pulse: 86  Temp:   Resp: 22     General:Alert thin cachetic WM NAD on ER stretcher  HEENT: Bearded [place] dry MM , nonicteric Neck: supple , no jvd Heart: RRR, no mur, rub or gallop Lungs: CTA non labored breathing Abdomen: bs pos. Soft nontender Extremities: R AKA stump healing incision / L anterior shin Erythematous  Ulcerations / 2nd toe black / l dorsum foot wet gangrene Skin: L dorsal foot wounds as abve, Multiple skin tattooed and some scattered dry abrasions Bilat  Arms   Neuro: alert ,ox3 , moves all extrem  No acute  Focal deficits  Dialysis Access: L U A AVF pos bruit / RIJ perm cath   Dialysis Orders: Center: [facility]  on TTS . EDW 67kg HD Bath 2.0k. 2.25 CA  Time 4hrs Heparin no . Access LUA  AVF (10-10-14 insert Dr. Arbie Cookey) Lonia Chimera cath    Aranesp q hd start 12/09/14  Venofer 100mg  loading last dose 12/09/14    Other op labs= hgb 7.9 < 2 u prbcs( 12/02/14) 6.3 < 8.5   ca 9.3 phos 5.6 pth 203  Alb 2.9  Assessment/Plan 1. Left foot wounds/ gangrene- Dr. Darrick Penna admit / on Vanco , Zosyn  2. ESRD -  HD on TTS no needs for HD today  K and  Resp. Vol stable  3. Hypertension/volume  - BP stable in ER/ on Midodine  For bp support on hd 4. Anemia  -hgb 7.9, fu hgb pre hd in am  Recent 2 u RBC transfusion 12/02/14 sp  R bka wound admit / max esa , on venofer   5. Metabolic bone disease -  Phos ^ correct ca 9.8 use phoslo binder  2.0 ca bath / no vit d 6. Nutrition - malnutrion / protein supplement .renal carb mod diet/ Dr. Darrick Penna checking prealbumin /  7. HO recent Admit R AKA wound dehesc in setting of ^^ INR  8. Ho A fib  On coumadin /amiodarone 9. CAD ho Cabg / CM ef 40%  Lenny Pastel, PA-C [facility] Beeper [phone] 12/08/2014, 2:07 PM

## 2014-12-08 NOTE — ED Notes (Signed)
PA at bedside.

## 2014-12-08 NOTE — ED Notes (Signed)
Doppler at bedside.

## 2014-12-08 NOTE — H&P (Addendum)
[facility] [place] HISTORY AND PHYSICAL   History of Present Illness:  Patient is a 49 y.o. year old male who presents for evaluation of left foot wound.  Pt known to me from prior right above knee amputation and revision a few weeks ago.  Now with worsening wound left foot.  He has had a chronic pretibial ulcer as well.  Pt admits to scraping and scuffing foot frequently while moving around in his wheelchair.  He remains severely malnourished and has had difficulty healing wounds secondary to this.  Other medical problems include CAD, DM, ESRD T Th Sat dialysis tobacco abuse.  He is on coumadin for afib.  Past Medical History  Diagnosis Date  . Coronary artery disease 07/05/2011    2D ECHO - EF ~40%, moderate concentric LV hypertrophy, LA moderately dilated, mild-moderate septal and inferior wall hypokinesis  . Hypertension   . CHF (congestive heart failure)   . Diabetes mellitus     Insulin dependent  . Cardiomyopathy   . Claudication 05/09/2011    Right and left anterior tibial arteries and Left SFA-occluded; Right CIA-<50% diameter reduction; Right Deep Profunda-70-99% diameter reduction; Right SFA->60% diameter reduction; Right Distal Popliteal/Tibial Artery- >60% diameter reduction; Left CFA and Profunda- >50% diameter reduction  . 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 GERD (gastroesophageal reflux disease)   . Peripheral vascular disease   . Chronic kidney disease (CKD), stage IV (severe)   . 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 Cardiac catheterization  05/07/2008    CABG  . Coronary artery bypass graft  06/2008    x3  . Insertion of dialysis catheter Right 10/01/2014    Procedure: INSERTION  OF RIGHT INTERNAL JUGULAR DIALYSIS CATHETER;  Surgeon: Larina Earthly, MD;  Location: [facility] OR;  Service: Vascular;  Laterality: Right;  . Amputation Right 10/06/2014    Procedure: AMPUTATION ABOVE KNEE Right;  Surgeon: Sherren Kerns, MD;  Location: [facility] OR;  Service: Vascular;  Laterality: Right;  . Av fistula placement Left 10/10/2014    Procedure: LEFT BRACHIAL TO CEPHALIC ARTERIOVENOUS (AV) FISTULA CREATION;  Surgeon: Larina Earthly, MD;  Location: [facility] OR;  Service: Vascular;  Laterality: Left;  . Amputation Right 11/26/2014    Procedure: REVISION AMPUTATION ABOVE KNEE;  Surgeon: Sherren Kerns, MD;  Location: [facility] OR;  Service: Vascular;  Laterality: Right;    Social History Social History  Substance Use Topics  . Smoking status: Former Smoker -- 0.50 packs/day for 25 years    Types: Cigarettes    Quit date: 09/03/2013  . Smokeless tobacco: Never Used  . Alcohol Use: Yes     Comment: occas.    Family History Family History  Problem Relation Age of Onset  . Heart disease Mother   . Hypertension Mother   . Diabetes Mother   . Arthritis Sister   . Diabetes Sister     Allergies  No Known Allergies   Current Facility-Administered Medications  Medication Dose Route Frequency Provider Last Rate Last Dose  . piperacillin-tazobactam (ZOSYN) IVPB 2.25 g  2.25 g Intravenous 3 times per day Almon Hercules, [facility]      . vancomycin (VANCOCIN) 1,500 mg in sodium chloride 0.9 %  500 mL IVPB  1,500 mg Intravenous Once Almon Hercules, [facility]       Current Outpatient Prescriptions  Medication Sig Dispense Refill  . Amino Acids-Protein Hydrolys (FEEDING SUPPLEMENT, PRO-STAT SUGAR FREE 64,) LIQD Take 30 mLs by mouth 2 (two) times daily. 900 mL 0  . amiodarone (PACERONE) 400 MG tablet Take 1 tablet (400 mg total) by mouth 2 (two) times daily. 60 tablet 0  . calcium acetate (PHOSLO) 667 MG capsule Take 2 capsules (1,334 mg total) by mouth 3 (three) times daily with meals. 90 capsule 0  . Darbepoetin Alfa  (ARANESP) 100 MCG/0.5ML SOSY injection Inject 0.5 mLs (100 mcg total) into the vein every Tuesday with hemodialysis. 4.2 mL 0  . Ferrous Sulfate (IRON) 325 (65 FE) MG TABS Take 325 mg by mouth daily.    . folic acid (FOLVITE) 1 MG tablet Take 1 tablet (1 mg total) by mouth daily. 30 tablet 0  . Melatonin 3 MG TABS Take 3 mg by mouth at bedtime.    . midodrine (PROAMATINE) 10 MG tablet Take 1 tablet (10 mg total) by mouth Every Tuesday,Thursday,and Saturday with dialysis. 30 tablet 0  . Multiple Vitamins-Minerals (DECUBI-VITE PO) Take 1 tablet by mouth daily.    . nitroGLYCERIN (NITROSTAT) 0.4 MG SL tablet Place 1 tablet (0.4 mg total) under the tongue once. (Patient taking differently: Place 0.4 mg under the tongue every 5 (five) minutes as needed for chest pain. ) 25 tablet 5  . Nutritional Supplements (FEEDING SUPPLEMENT, NEPRO CARB STEADY,) LIQD Take 237 mLs by mouth 2 (two) times daily between meals. 30 Can 0  . OxyCODONE (OXYCONTIN) 15 mg T12A 12 hr tablet Take one tablet by mouth every 12 hours for pain. Do not crush 10 tablet 0  . Oxycodone HCl 10 MG TABS Take one tablet by mouth every 4 hours as needed for breakthrough pain 30 tablet 0  . pantoprazole (PROTONIX) 40 MG tablet Take 40 mg by mouth daily.     . polyethylene glycol (MIRALAX / GLYCOLAX) packet Take 17 g by mouth daily as needed for moderate constipation. 14 each 0  . senna-docusate (SENOKOT-S) 8.6-50 MG per tablet Take 1 tablet by mouth 2 (two) times daily. 30 tablet 0  . vancomycin (VANCOCIN) 125 MG capsule Take 125 mg by mouth 4 (four) times daily.    Marland Kitchen warfarin (COUMADIN) 1 MG tablet Take 0.5-1 mg by mouth daily at 6 PM. Take 1 mg on Mon-Wed-Fri-Sat-Sun, and 0.5 mg on Tues-Thurs    . insulin aspart (NOVOLOG) 100 UNIT/ML injection Inject 0-9 Units into the skin 3 (three) times daily with meals. Before each meal 3 times a day, 140-199 - 2 units, 200-250 - 4 units, 251-299 - 6 units,  300-349 - 8 units,  350 or above 10  units. Insulin PEN if approved, provide syringes and needles if needed. (Patient taking differently: Inject 0-10 Units into the skin 4 (four) times daily -  with meals and at bedtime. 140-199 - 2 units, 200-250 - 4 units, 251-299 - 6 units,  300-349 - 8 units,  350 or above 10 units. Insulin PEN if approved, provide syringes and needles if needed) 10 mL 11    ROS:   General:  No weight loss, Fever, chills  HEENT: No recent headaches, no nasal bleeding, no visual changes, no sore throat  Neurologic: No dizziness, blackouts, seizures. No recent symptoms of stroke or mini- stroke. No recent episodes of slurred speech, or temporary blindness.  Cardiac: No  recent episodes of chest pain/pressure, no shortness of breath at rest.  + shortness of breath with exertion.  Denies history of atrial fibrillation or irregular heartbeat  Vascular: No history of rest pain in feet.  No history of claudication.  + history of non-healing ulcer, No history of DVT   Pulmonary: No home oxygen, no productive cough, no hemoptysis,  No asthma or wheezing  Musculoskeletal:   Arthritis,  Low back pain,   Joint pain  Hematologic:No history of hypercoagulable state.  No history of easy bleeding.  No history of anemia  Gastrointestinal: No hematochezia or melena,  + gastroesophageal reflux, no trouble swallowing  Urinary:  chronic Kidney disease, [x ] on HD -  MWF or [x ] TTHS,  Burning with urination,  Frequent urination,  Difficulty urinating;   Skin: No rashes  Psychological: No history of anxiety,  No history of depression   Physical Examination  Filed Vitals:   12/08/14 1230 12/08/14 1245 12/08/14 1300 12/08/14 1315  BP: 129/69 130/60 125/67 127/72  Pulse: 77 78 79 75  Temp:      TempSrc:      Resp:      SpO2: 100% 99% 100% 100%    There is no weight on file to calculate BMI.  General:  Alert and oriented, no acute distress HEENT: Normal Neck: No JVD Pulmonary: Clear to  auscultation bilaterally Cardiac: Regular Rate and Rhythm  Abdomen: Soft, non-tender, non-distended, no mass Skin: No rash Extremity Pulses:  2+ radial, brachial, femoral absent left dorsalis pedis, posterior tibial pulse, healing right AKA Musculoskeletal: No deformity diffuse left leg edema extending onto abdomen  Neurologic: Upper and lower extremity motor 5/5 and symmetric  DATA:   CBC    Component Value Date/Time   WBC 15.9* 12/08/2014 1338   RBC 2.63* 12/08/2014 1338   HGB 7.9* 12/08/2014 1338   HCT 25.2* 12/08/2014 1338   PLT 151 12/08/2014 1338   MCV 95.8 12/08/2014 1338   MCH 30.0 12/08/2014 1338   MCHC 31.3 12/08/2014 1338   RDW 18.9* 12/08/2014 1338   LYMPHSABS 0.4* 12/08/2014 1338   MONOABS 1.0 12/08/2014 1338   EOSABS 0.0 12/08/2014 1338   BASOSABS 0.0 12/08/2014 1338     BMET    Component Value Date/Time   NA 135 12/03/2014 1153   K 5.2* 12/03/2014 1153   CL 96* 12/03/2014 1153   CO2 24 12/03/2014 1153   GLUCOSE 178* 12/03/2014 1153   BUN 32* 12/03/2014 1153   CREATININE 3.74* 12/03/2014 1153   CALCIUM 8.5* 12/03/2014 1153   GFRNONAA 17* 12/03/2014 1153   GFRAA 20* 12/03/2014 1153      CBC    Component Value Date/Time   WBC 11.0* 12/03/2014 1153   RBC 2.65* 12/03/2014 1153   HGB 7.9* 12/03/2014 1153   HCT 25.0* 12/03/2014 1153   PLT 125* 12/03/2014 1153   MCV 94.3 12/03/2014 1153   MCH 29.8 12/03/2014 1153   MCHC 31.6 12/03/2014 1153   RDW 19.6* 12/03/2014 1153   LYMPHSABS 0.5* 12/02/2014 1956   MONOABS 0.8 12/02/2014 1956   EOSABS 0.0 12/02/2014 1956   BASOSABS 0.0 12/02/2014 1956    BMET    Component Value Date/Time   NA 135 12/03/2014 1153   K 5.2* 12/03/2014 1153   CL 96* 12/03/2014 1153   CO2 24 12/03/2014 1153   GLUCOSE 178* 12/03/2014 1153   BUN 32* 12/03/2014 1153   CREATININE 3.74* 12/03/2014 1153  CALCIUM 8.5* 12/03/2014 1153   GFRNONAA 17* 12/03/2014 1153   GFRAA 20* 12/03/2014 1153    ASSESSMENT:  Large wound  left foot, with pt prior history of malnutrition and poor healing I do not feel he is a candidate for bypass however if we can do something percutaneously it might be worthwhile before proceeding with amputation although I discussed with pt that this is highly likely.  Left leg edema probably dependent but will get duplex to r/o DVT.     PLAN:  1.  Type and screen FFP with dialysis tomorrow if INR elevated.  2.  Angiogram with possible intervention on Wednesday  3. Consult Nephrology for dialysis needs  4.  Nepro for feeding supplement  5.  Check prealbumin for nutrition status   6.  Check LFT and hepatitis profile for underlying liver dysfunction  6.  Antibiotics for now with leukocytosis  7.  Anemia trend for now  8. Wound care left leg xeroform and kerlix  Fabienne Bruns, MD [facility] [place] Office: [phone] Pager: [phone]

## 2014-12-08 NOTE — Progress Notes (Signed)
ANTIBIOTIC CONSULT NOTE - INITIAL  Pharmacy Consult for vanc/zosyn Indication: Cellulitis  No Known Allergies  Patient Measurements:     Vital Signs: Temp: 98.5 F (36.9 C) (08/15 1210) Temp Source: Oral (08/15 1210) BP: 127/72 mmHg (08/15 1315) Pulse Rate: 75 (08/15 1315) Intake/Output from previous day:   Intake/Output from this shift:    Labs: No results for input(s): WBC, HGB, PLT, LABCREA, CREATININE in the last 72 hours. Estimated Creatinine Clearance: 22.4 mL/min (by C-G formula based on Cr of 3.74). No results for input(s): VANCOTROUGH, VANCOPEAK, VANCORANDOM, GENTTROUGH, GENTPEAK, GENTRANDOM, TOBRATROUGH, TOBRAPEAK, TOBRARND, AMIKACINPEAK, AMIKACINTROU, AMIKACIN in the last 72 hours.   Microbiology: Recent Results (from the past 720 hour(s))  MRSA PCR Screening     Status: None   Collection Time: 12/03/14  2:34 AM  Result Value Ref Range Status   MRSA by PCR NEGATIVE NEGATIVE Final    Comment:        The GeneXpert MRSA Assay (FDA approved for NASAL specimens only), is one component of a comprehensive MRSA colonization surveillance program. It is not intended to diagnose MRSA infection nor to guide or monitor treatment for MRSA infections.     Medical History: Past Medical History  Diagnosis Date  . Coronary artery disease 07/05/2011    2D ECHO - EF ~40%, moderate concentric LV hypertrophy, LA moderately dilated, mild-moderate septal and inferior wall hypokinesis  . Hypertension   . CHF (congestive heart failure)   . Diabetes mellitus     Insulin dependent  . Cardiomyopathy   . Claudication 05/09/2011    Right and left anterior tibial arteries and Left SFA-occluded; Right CIA-<50% diameter reduction; Right Deep Profunda-70-99% diameter reduction; Right SFA->60% diameter reduction; Right Distal Popliteal/Tibial Artery- >60% diameter reduction; Left CFA and Profunda- >50% diameter reduction  . S/P CABG (coronary artery bypass graft) 03/23/2011`    STRESS  TEST - LV EF 29%, mild reversible within the apical segment of the anterior and anterolateral wall, small infarct in the inferolateral wall, global hypokinesia  . GERD (gastroesophageal reflux disease)   . Peripheral vascular disease   . Chronic kidney disease (CKD), stage IV (severe)   . 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 Pharmacy consulted to dose vanc/zosyn for cellulitis. vanc/zosyn for cellulitis. ESRD - HD TTS. Afebrile, no labs yet  8/15 vanc>> 8/15 zosyn>>  8/15 BCx2>> 8/15 UC>>   Goal of Therapy:  Vancomycin trough level 10-15 mcg/ml  Plan:  Vanc  IV x 1 dose Zosyn 2.25g IV q8h F/u HD schedule inpatient for further vanc dosing Mon clinical progress, c/s, abx plan   Babs Bertin, PharmD Clinical Pharmacist Pager [phone] 12/08/2014 1:38 PM

## 2014-12-08 NOTE — ED Notes (Signed)
Pt only produces a small amt of urine, states he urinated this AM and usually does not go again.

## 2014-12-08 NOTE — ED Notes (Signed)
Pt reports taking oxy  around 11am.

## 2014-12-08 NOTE — ED Provider Notes (Signed)
CSN: [id number]     Arrival date & time 12/08/14  1206 History   First MD Initiated Contact with Patient 12/08/14 1228     Chief Complaint  Patient presents with  . Leg Pain  . Circulatory Problem     (Consider location/radiation/quality/duration/timing/severity/associated sxs/prior Treatment) HPI   Patient is a 49 year old male with history of CAD, hypertension, CHF, diabetes, end-stage renal disease with dialysis, recent AKA of the right lower extremity, peripheral vascular disease, who was sent to the ER today after a visit at his wound clinic for concerns of progressive gangrene of his left lower extremity and decreased pulses.  He has been working on his wound care and yesterday he had increasing purulence and his bandages on his left lower extremity. He has some mild left leg discomfort but no acute increase in pain. At his nursing home he apparently expressed some thoughts of self-harm while expressing his frustration with his multiple chronic diseases.  He currently is denying SI, HI, AVH and does not have any plan.  He denies any shortness of breath, chest pain, abdominal pain, lightheadedness, fever, chills or sweats.    Past Medical History  Diagnosis Date  . Coronary artery disease 07/05/2011    2D ECHO - EF ~40%, moderate concentric LV hypertrophy, LA moderately dilated, mild-moderate septal and inferior wall hypokinesis  . Hypertension   . CHF (congestive heart failure)   . Diabetes mellitus     Insulin dependent  . Cardiomyopathy   . Claudication 05/09/2011    Right and left anterior tibial arteries and Left SFA-occluded; Right CIA-<50% diameter reduction; Right Deep Profunda-70-99% diameter reduction; Right SFA->60% diameter reduction; Right Distal Popliteal/Tibial Artery- >60% diameter reduction; Left CFA and Profunda- >50% diameter reduction  . S/P CABG (coronary artery bypass graft) 03/23/2011`    STRESS TEST - LV EF 29%, mild reversible within the apical segment of the  anterior and anterolateral wall, small infarct in the inferolateral wall, global hypokinesia  . GERD (gastroesophageal reflux disease)   . Peripheral vascular disease   . Chronic kidney disease (CKD), stage IV (severe)   . 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 Cardiac catheterization  05/07/2008    CABG  . Coronary artery bypass graft  06/2008    x3  . Insertion of dialysis catheter Right 10/01/2014    Procedure: INSERTION OF RIGHT INTERNAL JUGULAR DIALYSIS CATHETER;  Surgeon: Larina Earthly, MD;  Location: [facility] OR;  Service: Vascular;  Laterality: Right;  . Amputation Right 10/06/2014    Procedure: AMPUTATION ABOVE KNEE Right;  Surgeon: Sherren Kerns, MD;  Location: [facility] OR;  Service: Vascular;  Laterality: Right;  . Av fistula placement Left 10/10/2014    Procedure: LEFT BRACHIAL TO CEPHALIC ARTERIOVENOUS (AV) FISTULA CREATION;  Surgeon: Larina Earthly, MD;  Location: [facility] OR;  Service: Vascular;  Laterality: Left;  . Amputation Right 11/26/2014    Procedure: REVISION AMPUTATION ABOVE KNEE;  Surgeon: Sherren Kerns, MD;  Location: [facility] OR;  Service: Vascular;  Laterality: Right;   Family History  Problem Relation Age of Onset  . Heart disease Mother   . Hypertension Mother   . Diabetes Mother   . Arthritis Sister   . Diabetes Sister    Social History  Substance Use Topics  . Smoking status: Former Smoker -- 0.50 packs/day for 25 years    Types: Cigarettes    Quit date: 09/03/2013  .  Smokeless tobacco: Never Used  . Alcohol Use: Yes     Comment: occas.    Review of Systems  Constitutional: Negative.   HENT: Negative.   Eyes: Negative.   Respiratory: Negative.   Cardiovascular: Negative.   Gastrointestinal: Negative.   Skin: Positive for wound. Negative for color change and pallor.  Neurological: Negative.   Psychiatric/Behavioral: Negative.   Neck: no LAD GU:  No dysuria / hematuria  Allergies   Review of patient's allergies indicates no known allergies.  Home Medications   Prior to Admission medications   Medication Sig Start Date End Date Taking? Authorizing Provider  Amino Acids-Protein Hydrolys (FEEDING SUPPLEMENT, PRO-STAT SUGAR FREE 64,) LIQD Take 30 mLs by mouth 2 (two) times daily. 10/17/14  Yes Albertine Grates, MD  amiodarone (PACERONE) 400 MG tablet Take 1 tablet (400 mg total) by mouth 2 (two) times daily. 10/17/14  Yes Albertine Grates, MD  calcium acetate (PHOSLO) 667 MG capsule Take 2 capsules (1,334 mg total) by mouth 3 (three) times daily with meals. 10/17/14  Yes Albertine Grates, MD  Darbepoetin Alfa (ARANESP) 100 MCG/0.5ML SOSY injection Inject 0.5 mLs (100 mcg total) into the vein every Tuesday with hemodialysis. 10/17/14  Yes Albertine Grates, MD  Ferrous Sulfate (IRON) 325 (65 FE) MG TABS Take 325 mg by mouth daily.   Yes Historical Provider, MD  folic acid (FOLVITE) 1 MG tablet Take 1 tablet (1 mg total) by mouth daily. 10/17/14  Yes Albertine Grates, MD  Melatonin 3 MG TABS Take 3 mg by mouth at bedtime.   Yes Historical Provider, MD  midodrine (PROAMATINE) 10 MG tablet Take 1 tablet (10 mg total) by mouth Every Tuesday,Thursday,and Saturday with dialysis. 10/18/14  Yes Albertine Grates, MD  Multiple Vitamins-Minerals (DECUBI-VITE PO) Take 1 tablet by mouth daily.   Yes Historical Provider, MD  nitroGLYCERIN (NITROSTAT) 0.4 MG SL tablet Place 1 tablet (0.4 mg total) under the tongue once. Patient taking differently: Place 0.4 mg under the tongue every 5 (five) minutes as needed for chest pain.  10/01/13  Yes Mihai Croitoru, MD  Nutritional Supplements (FEEDING SUPPLEMENT, NEPRO CARB STEADY,) LIQD Take 237 mLs by mouth 2 (two) times daily between meals. 10/17/14  Yes Albertine Grates, MD  OxyCODONE (OXYCONTIN) 15 mg T12A 12 hr tablet Take one tablet by mouth every 12 hours for pain. Do not crush 12/03/14  Yes Meredeth Ide, MD  Oxycodone HCl 10 MG TABS Take one tablet by mouth every 4 hours as needed for breakthrough pain 12/03/14  Yes  Meredeth Ide, MD  pantoprazole (PROTONIX) 40 MG tablet Take 40 mg by mouth daily.  05/08/13  Yes Historical Provider, MD  polyethylene glycol (MIRALAX / GLYCOLAX) packet Take 17 g by mouth daily as needed for moderate constipation. 10/17/14  Yes Albertine Grates, MD  senna-docusate (SENOKOT-S) 8.6-50 MG per tablet Take 1 tablet by mouth 2 (two) times daily. 10/17/14  Yes Albertine Grates, MD  vancomycin (VANCOCIN) 125 MG capsule Take 125 mg by mouth 4 (four) times daily.   Yes Historical Provider, MD  warfarin (COUMADIN) 1 MG tablet Take 0.5-1 mg by mouth daily at 6 PM. Take 1 mg on Mon-Wed-Fri-Sat-Sun, and 0.5 mg on Tues-Thurs 11/18/14  Yes Historical Provider, MD  insulin aspart (NOVOLOG) 100 UNIT/ML injection Inject 0-9 Units into the skin 3 (three) times daily with meals. Before each meal 3 times a day, 140-199 - 2 units, 200-250 - 4 units, 251-299 - 6 units,  300-349 - 8 units,  350 or  above 10 units. Insulin PEN if approved, provide syringes and needles if needed. Patient taking differently: Inject 0-10 Units into the skin 4 (four) times daily -  with meals and at bedtime. 140-199 - 2 units, 200-250 - 4 units, 251-299 - 6 units,  300-349 - 8 units,  350 or above 10 units. Insulin PEN if approved, provide syringes and needles if needed 10/18/14   Albertine Grates, MD   BP 131/65 mmHg  Pulse 86  Temp(Src) 98.5 F (36.9 C) (Oral)  Resp 22  SpO2 100% Physical Exam  Constitutional: He is oriented to person, place, and time. Vital signs are normal. He appears well-developed and well-nourished. He appears cachectic. He is cooperative. He has a sickly appearance. No distress.  Chronically ill-appearing male, appears older than stated age, in no acute distress  HENT:  Head: Normocephalic and atraumatic.  Nose: Nose normal.  Mouth/Throat: Oropharynx is clear and moist.  Eyes: Conjunctivae and EOM are normal. Pupils are equal, round, and reactive to light. Right eye exhibits no discharge. Left eye exhibits no discharge. No  scleral icterus.  Neck: Normal range of motion. No JVD present. No tracheal deviation present. No thyromegaly present.  Cardiovascular: Normal rate, regular rhythm, normal heart sounds and intact distal pulses.  Exam reveals no gallop and no friction rub.   No murmur heard. Pulses:      Dorsalis pedis pulses are 0 on the left side. Right dorsalis pedis pulse not accessible.       Posterior tibial pulses are 1+ on the left side. Right posterior tibial pulse not accessible.  Dorsal pedis pulse on the left lower extremity not obtained with Doppler, posterior tibialis pulses easily obtained with Doppler  Pulmonary/Chest: Effort normal. No accessory muscle usage. No tachypnea. No respiratory distress. He has decreased breath sounds. He has no wheezes. He has no rhonchi. He has no rales. He exhibits no tenderness.  Decreased breath sounds throughout Hemodialysis catheter right upper chest, dressing and surrounding tissue appear clean dry intact  Abdominal: Soft. Bowel sounds are normal. He exhibits no distension and no mass. There is no tenderness. There is no rebound and no guarding.  Musculoskeletal: Normal range of motion. He exhibits no edema or tenderness.  AKA on right with dressing applied Left arm fistula, dressing applied covering left arm and elbow    Lymphadenopathy:    He has no cervical adenopathy.  Neurological: He is alert and oriented to person, place, and time. He has normal reflexes. He displays no tremor. He displays no seizure activity.  Skin: Skin is warm. Lesion noted. No rash noted. He is not diaphoretic. There is erythema. No pallor.  Wet gangrene to dorsum of left foot and multiple digits, 2nd toe black Some lesions with ulceration with erythema over left anterior shin  Psychiatric: He has a normal mood and affect. His behavior is normal. Judgment and thought content normal.  Nursing note and vitals reviewed.   ED Course  Procedures (including critical care time) Labs  Review Labs Reviewed  COMPREHENSIVE METABOLIC PANEL - Abnormal; Notable for the following:    Sodium 133 (*)    Potassium 5.2 (*)    Chloride 93 (*)    Glucose, Bld 146 (*)    BUN 55 (*)    Creatinine, Ser 4.83 (*)    Calcium 8.8 (*)    Total Protein 5.5 (*)    Albumin 2.1 (*)    GFR calc non Af Amer 13 (*)    GFR calc Af  Amer 15 (*)    All other components within normal limits  CBC WITH DIFFERENTIAL/PLATELET - Abnormal; Notable for the following:    WBC 15.9 (*)    RBC 2.63 (*)    Hemoglobin 7.9 (*)    HCT 25.2 (*)    RDW 18.9 (*)    Neutrophils Relative % 91 (*)    Neutro Abs 14.5 (*)    Lymphocytes Relative 3 (*)    Lymphs Abs 0.4 (*)    All other components within normal limits  CULTURE, BLOOD (ROUTINE X 2)  CULTURE, BLOOD (ROUTINE X 2)  URINE CULTURE  URINALYSIS, ROUTINE W REFLEX MICROSCOPIC (NOT AT [facility])  PROTIME-INR  APTT  FIBRINOGEN  PREALBUMIN  HEPATITIS C ANTIBODY  HEPATITIS B SURFACE ANTIBODY, QUANTITATIVE  HIV ANTIBODY (ROUTINE TESTING)  I-STAT CG4 LACTIC ACID, ED  TYPE AND SCREEN    Imaging Review No results found. I, Danelle Berry, personally reviewed and evaluated these images and lab results as part of my medical decision-making.   EKG Interpretation   Date/Time:  Monday December 08 2014 13:44:43 EDT Ventricular Rate:  77 PR Interval:  80 QRS Duration: 147 QT Interval:  466 QTC Calculation: 527 R Axis:   -141 Text Interpretation:  Sinus rhythm Short PR interval Consider left  ventricular hypertrophy Lateral infarct, age indeterminate Prolonged QT  interval since last tracing no significant change Confirmed by Hyacinth Meeker  MD,  Deseree Zemaitis ([id number]) on 12/08/2014 1:49:18 PM      MDM   Final diagnoses:  None  Patient with wet gangrene of left lower extremity which has been progressive, sent to the ER today to see vascular, posterior tibialis pulse was easily obtained with doppler, left lower extremity scan which was without any wound or gangrene appeared  pink.  His right AKA was dressed this morning by wound care, because it was recently assessed it was not unwrapped, but had some blood visible in the dressing.    Patient denies SI, HI, AVH The work up was initiated for preoperative/sepsis, EKG, CXR, trop, basic labs, lactic, blood cultures, UA,  Vascular surgery came to the ER and assessed the patient - Dr. Darrick Penna to coordinate admission    Danelle Berry, PA-C 12/08/14 1428  Eber Hong, MD 12/10/14 [phone]

## 2014-12-08 NOTE — ED Notes (Signed)
Attempted report 

## 2014-12-08 NOTE — ED Notes (Signed)
Vascular at bedside

## 2014-12-09 LAB — GLUCOSE, CAPILLARY
Glucose-Capillary: 123 mg/dL — ABNORMAL HIGH (ref 65–99)
Glucose-Capillary: 120 mg/dL — ABNORMAL HIGH (ref 65–99)
Glucose-Capillary: 122 mg/dL — ABNORMAL HIGH (ref 65–99)

## 2014-12-09 LAB — RENAL FUNCTION PANEL
ALBUMIN: 2 g/dL — AB (ref 3.5–5.0)
Anion gap: 17 — ABNORMAL HIGH (ref 5–15)
BUN: 61 mg/dL — AB (ref 6–20)
CALCIUM: 8.5 mg/dL — AB (ref 8.9–10.3)
CHLORIDE: 90 mmol/L — AB (ref 101–111)
CO2: 25 mmol/L (ref 22–32)
CREATININE: 5.27 mg/dL — AB (ref 0.61–1.24)
GFR, EST AFRICAN AMERICAN: 13 mL/min — AB (ref >60)
GFR, EST NON AFRICAN AMERICAN: 12 mL/min — AB (ref >60)
Glucose, Bld: 126 mg/dL — ABNORMAL HIGH (ref 65–99)
PHOSPHORUS: 5.7 mg/dL — AB (ref 2.5–4.6)
Potassium: 4.5 mmol/L (ref 3.5–5.1)
SODIUM: 132 mmol/L — AB (ref 135–145)

## 2014-12-09 LAB — CBC
HCT: 24.4 % — ABNORMAL LOW (ref 39.0–52.0)
Hemoglobin: 7.6 g/dL — ABNORMAL LOW (ref 13.0–17.0)
MCH: 29.8 pg (ref 26.0–34.0)
MCHC: 31.1 g/dL (ref 30.0–36.0)
MCV: 95.7 fL (ref 78.0–100.0)
PLATELETS: 157 10*3/uL (ref 150–400)
RBC: 2.55 MIL/uL — AB (ref 4.22–5.81)
RDW: 18.8 % — AB (ref 11.5–15.5)
WBC: 14.9 10*3/uL — AB (ref 4.0–10.5)

## 2014-12-09 LAB — HEPATITIS B SURFACE ANTIBODY, QUANTITATIVE: Hepatitis B-Post: 305.3 m[IU]/mL (ref >9.9)

## 2014-12-09 LAB — HIV ANTIBODY (ROUTINE TESTING W REFLEX): HIV Screen 4th Generation wRfx: NONREACTIVE

## 2014-12-09 LAB — HEPATITIS C ANTIBODY: HCV Ab: 0.2 s/co ratio (ref 0.0–0.9)

## 2014-12-09 LAB — PROTIME-INR
INR: 2.27 — AB (ref 0.00–1.49)
INR: 1.86 — AB (ref 0.00–1.49)
PROTHROMBIN TIME: 21.4 s — AB (ref 11.6–15.2)
Prothrombin Time: 24.8 seconds — ABNORMAL HIGH (ref 11.6–15.2)

## 2014-12-09 MED ORDER — SODIUM CHLORIDE 0.9 % IV SOLN: Freq: Once | INTRAVENOUS | Status: DC

## 2014-12-09 MED ORDER — DARBEPOETIN ALFA 200 MCG/0.4ML IJ SOSY
PREFILLED_SYRINGE | INTRAMUSCULAR | Status: AC
  Filled 2014-12-09: qty 0.4

## 2014-12-09 NOTE — Progress Notes (Signed)
UR Completed. Cathe Bilger, RN, BSN.  [phone] 

## 2014-12-09 NOTE — Progress Notes (Signed)
Blood bank called to inform that 1 unit ffp ready for pick up. Will pass along in report.

## 2014-12-09 NOTE — Progress Notes (Signed)
PT/INR results called to Dr Myra Gianotti at 1800 and orders received for 1 unit ffp tonight and to make sure PT/INR ordered for am-which it is. Will call blood bank.

## 2014-12-09 NOTE — Progress Notes (Signed)
Patient ID: [name], male   DOB: July 02, 1965, 49 y.o.   MRN: [id number]  [facility] Progress Note   Assessment/ Plan:   1. Left foot ischemic wounds: awaiting possible revascularization procedure v/s amputation of LLE tomorrow per further work up by VVS. On empiric Vancomycin and Zosyn 2.ESRD continue on TTS HD schedule at this time 3. Anemia:secondary to malnutrition-inflammation complex and ESA resistance--will continue ESA and give PRBCs as needed. 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 Nutrition:malnourished and hypoalbuminemic due to leg ulcers- suspect will impair wound healing 6. Hypertension:BP well controlled  Subjective:   Some pain overnight- understands plan for left leg management   Objective:   BP 117/60 mmHg  Pulse 76  Temp(Src) 98.4 F (36.9 C) (Oral)  Resp 16  SpO2 91%  Physical Exam: JXB:[id number] resting in HD- somnolent YQM:VHQIO RRR, normal s1 and s2 Resp:CTA bilaterally, no rales/rhonchi NGE:XBMW, obese, NT, BS normal UXL:KGMW leg in gauze dressing and supportive boot. S/p right AKA.  Labs: BMET  Recent Labs Lab 12/02/14 1956 12/03/14 1153 12/08/14 1338  NA 139 135 133*  K 4.5 5.2* 5.2*  CL 97* 96* 93*  CO2 GLUCOSE 65 178* 146*  BUN 23* 32* 55*  CREATININE 3.13* 3.74* 4.83*  CALCIUM 8.8* 8.5* 8.8*   CBC  Recent Labs Lab 12/02/14 1956 12/03/14 1153 12/08/14 1338  WBC 9.1 11.0* 15.9*  NEUTROABS 7.8*  --  14.5*  HGB 7.0* 7.9* 7.9*  HCT 22.7* 25.0* 25.2*  MCV 96.6 94.3 95.8  PLT 135* 125* 151   Medications:    . amiodarone  400 mg Oral BID  . calcium acetate  1,334 mg Oral TID WC  . calcium acetate  1,334 mg Oral TID WC  . darbepoetin (ARANESP) injection - DIALYSIS  200 mcg Intravenous Q Tue-HD  . feeding supplement (NEPRO CARB STEADY)  237 mL Oral TID BM  . ferric gluconate (FERRLECIT/NULECIT) IV  125 mg Intravenous Q Tue-HD  . ferrous sulfate   325 mg Oral Daily  . folic acid  1 mg Oral Daily  . heparin  5,000 Units Subcutaneous 3 times per day  . insulin aspart  0-9 Units Subcutaneous TID WC  . midodrine  10 mg Oral Q T,Th,Sa-HD  . multivitamin  1 tablet Oral QHS  . OxyCODONE  15 mg Oral Q12H  . pantoprazole  40 mg Oral Daily  . piperacillin-tazobactam (ZOSYN)  IV  2.25 g Intravenous 3 times per day  . vancomycin  750 mg Intravenous Q T,Th,Sa-HD   Zetta Bills, MD 12/09/2014, 7:37 AM

## 2014-12-09 NOTE — Procedures (Signed)
Patient seen on Hemodialysis. QB 400, UF goal 3L Treatment adjusted as needed.  Zetta Bills MD [facility]. Office # [phone] Pager # [phone] 7:46 AM

## 2014-12-09 NOTE — Clinical Documentation Improvement (Signed)
Please specify diagnosis related to below supporting information, if appropriate.    Possible Clinical Conditions?   Expected Acute Blood Loss Anemia  Acute Blood Loss Anemia  Acute on chronic blood loss anemia  Chronic blood loss anemia  Chronic Anemia with renal disease   [name] Condition________________  Cannot Clinically Determine   Supporting Information: Per 12/09/14 MD progress note = Anemia:secondary to malnutrition-inflammation complex and ESA resistance--will continue ESA and give PRBCs as needed. On aranesp/IV Fe.  Per 12/08/14 H&P  = Hematologic:No history of hypercoagulable state. No history of easy bleeding. No history of anemia.   H&H.   Anemia trend for now 8.    Patient's labs during this admission Component     Latest Ref Rng 12/08/2014         1:38 PM  Hemoglobin     13.0 - 17.0 g/dL 7.9 (L)  HCT     29.5 - 52.0 % 25.2 (L)    Component     Latest Ref Rng 12/09/2014         7:55 AM  Hemoglobin     13.0 - 17.0 g/dL 7.6 (L)  HCT     62.1 - 52.0 % 24.4 (L)      Thank You, Shelda Pal ,RN Clinical Documentation Specialist:  [phone]  [facility]- Health Information Management

## 2014-12-09 NOTE — Progress Notes (Signed)
  [facility] Progress Note  Subjective  - Patient seen on HD. Having mild pain with left foot.    Objective Filed Vitals:   12/09/14 1141  BP: 121/6  Pulse: 74  Temp: 98.3 F (36.8 C)  Resp:     Intake/Output Summary (Last 24 hours) at 12/09/14 1220 Last data filed at 12/09/14 1141  Gross per 24 hour  Intake    120 ml  Output   2575 ml  Net  -2455 ml   Right AKA healing well Left upper arm fistula with palpable thrill Left foot dressed.   Assessment/Planning: 49 y.o. male is with large wound to left foot.   Venous duplex left leg pending to rule out DVT 2 units FFP given with HD.  Continue vanc/zosyn given leukocytosis For aortogram with left runoff tomorrow and possible intervention. Obtain consent. NPO past midnight.   Raymond Gurney 12/09/2014 12:20 PM --  Laboratory CBC    Component Value Date/Time   WBC 14.9* 12/09/2014 0755   HGB 7.6* 12/09/2014 0755   HCT 24.4* 12/09/2014 0755   PLT 157 12/09/2014 0755    BMET    Component Value Date/Time   NA 132* 12/09/2014 0755   K 4.5 12/09/2014 0755   CL 90* 12/09/2014 0755   CO2 25 12/09/2014 0755   GLUCOSE 126* 12/09/2014 0755   BUN 61* 12/09/2014 0755   CREATININE 5.27* 12/09/2014 0755   CALCIUM 8.5* 12/09/2014 0755   GFRNONAA 12* 12/09/2014 0755   GFRAA 13* 12/09/2014 0755    COAG Lab Results  Component Value Date   INR 2.27* 12/09/2014   INR 2.16* 12/08/2014   INR 1.76* 12/03/2014   No results found for: PTT  Antibiotics Anti-infectives    Start     Dose/Rate Route Frequency Ordered Stop   12/09/14 1200  vancomycin (VANCOCIN) IVPB 750 mg/150 ml premix     750 mg 150 mL/hr over 60 Minutes Intravenous Every T-Th-Sa (Hemodialysis) 12/08/14 1504     12/08/14 1400  piperacillin-tazobactam (ZOSYN) IVPB 2.25 g     2.25 g 100 mL/hr over 30 Minutes Intravenous 3 times per day 12/08/14 1336     12/08/14 1345  vancomycin (VANCOCIN) 1,500 mg in sodium chloride 0.9 % 500 mL  IVPB     1,500 mg 250 mL/hr over 120 Minutes Intravenous  Once 12/08/14 1336 12/08/14 1754       Maris Berger, PA-C [facility] Office: [phone] Pager: [phone] 12/09/2014 12:20 PM

## 2014-12-10 ENCOUNTER — Inpatient Hospital Stay (HOSPITAL_COMMUNITY): Payer: Medicare Other

## 2014-12-10 ENCOUNTER — Ambulatory Visit (HOSPITAL_COMMUNITY): Admission: RE | Admit: 2014-12-10 | Payer: Medicare Other | Source: Ambulatory Visit | Admitting: Vascular Surgery

## 2014-12-10 DIAGNOSIS — M79662 Pain in left lower leg: Secondary | ICD-10-CM

## 2014-12-10 DIAGNOSIS — M7989 Other specified soft tissue disorders: Secondary | ICD-10-CM

## 2014-12-10 LAB — PREPARE FRESH FROZEN PLASMA
UNIT DIVISION: 0
Unit division: 0

## 2014-12-10 LAB — BASIC METABOLIC PANEL
Anion gap: 12 (ref 5–15)
BUN: 33 mg/dL — AB (ref 6–20)
CHLORIDE: 96 mmol/L — AB (ref 101–111)
CO2: 24 mmol/L (ref 22–32)
CREATININE: 3.26 mg/dL — AB (ref 0.61–1.24)
Calcium: 8.1 mg/dL — ABNORMAL LOW (ref 8.9–10.3)
GFR calc Af Amer: 24 mL/min — ABNORMAL LOW (ref >60)
GFR, EST NON AFRICAN AMERICAN: 21 mL/min — AB (ref >60)
Glucose, Bld: 126 mg/dL — ABNORMAL HIGH (ref 65–99)
Potassium: 3.5 mmol/L (ref 3.5–5.1)
SODIUM: 132 mmol/L — AB (ref 135–145)

## 2014-12-10 LAB — CBC
HCT: 24.7 % — ABNORMAL LOW (ref 39.0–52.0)
Hemoglobin: 7.6 g/dL — ABNORMAL LOW (ref 13.0–17.0)
MCH: 29.7 pg (ref 26.0–34.0)
MCHC: 30.8 g/dL (ref 30.0–36.0)
MCV: 96.5 fL (ref 78.0–100.0)
PLATELETS: 138 10*3/uL — AB (ref 150–400)
RBC: 2.56 MIL/uL — ABNORMAL LOW (ref 4.22–5.81)
RDW: 18.6 % — ABNORMAL HIGH (ref 11.5–15.5)
WBC: 11.9 10*3/uL — ABNORMAL HIGH (ref 4.0–10.5)

## 2014-12-10 LAB — URINE CULTURE

## 2014-12-10 LAB — PREPARE RBC (CROSSMATCH)

## 2014-12-10 LAB — GLUCOSE, CAPILLARY
GLUCOSE-CAPILLARY: 129 mg/dL — AB (ref 65–99)
GLUCOSE-CAPILLARY: 151 mg/dL — AB (ref 65–99)
GLUCOSE-CAPILLARY: 122 mg/dL — AB (ref 65–99)
Glucose-Capillary: 183 mg/dL — ABNORMAL HIGH (ref 65–99)

## 2014-12-10 LAB — PROTIME-INR
INR: 1.79 — ABNORMAL HIGH (ref 0.00–1.49)
Prothrombin Time: 20.7 seconds — ABNORMAL HIGH (ref 11.6–15.2)

## 2014-12-10 MED ORDER — SODIUM CHLORIDE 0.9 % IV SOLN: Freq: Once | INTRAVENOUS | Status: DC

## 2014-12-10 NOTE — Clinical Social Work Note (Signed)
Clinical Social Work Assessment  Patient Details  Name: [name] MRN: [id number] Date of Birth: 1965/12/15  Date of referral:  12/10/14               Reason for consult:  Facility Placement                Permission sought to share information with:  Oceanographer granted to share information::  Yes, Release of Information Signed  Name::        Agency::  [facility]  Relationship::     Contact Information:     Housing/Transportation Living arrangements for the past 2 months:  Skilled Nursing Facility Source of Information:  Patient Patient Interpreter Needed:  None Criminal Activity/Legal Involvement Pertinent to Current Situation/Hospitalization:  No - Comment as needed Significant Relationships:  Parents, Siblings Lives with:  Self Do you feel safe going back to the place where you live?  Yes Need for family participation in patient care:  No (Coment)  Care giving concerns:  None- pt has been staying at [facility] for the past month   Office manager / plan:  CSW spoke with pt concerning return to [place] when stable for DC from the hospital setting  Employment status:  Retired Health and safety inspector:  Armed forces operational officer, Medicaid In [place] PT Recommendations:  Skilled Nursing Facility Information / Referral to community resources:  Skilled Nursing Facility  Patient/Family's Response to care:  Pt states that he is agreeable to returning to [place] at time of DC  Patient/Family's Understanding of and Emotional Response to Diagnosis, Current Treatment, and Prognosis:  Pt is struggling with possibility of further amputation- was very emotional at SNF when told he might need to get other leg amputated.  Emotional Assessment Appearance:  Appears older than stated age Attitude/Demeanor/Rapport:    Affect (typically observed):  Appropriate Orientation:  Oriented to Self, Oriented to Place, Oriented to  Time, Oriented to Situation Alcohol / Substance use:  Not  Applicable Psych involvement (Current and /or in the community):  No (Comment)  Discharge Needs  Concerns to be addressed:  Care Coordination Readmission within the last 30 days:  Yes Current discharge risk:  Physical Impairment Barriers to Discharge:  Continued Medical Work up   [name] Ribas, LCSW 12/10/2014, 3:34 PM

## 2014-12-10 NOTE — Progress Notes (Signed)
   INR too high for angiogram.  Give Vitamin K 1 mg IV.  Will try to reschedule for tomorrow.  Leonides Sake, MD [facility] [place] Office: [phone] Pager: [phone]  12/10/2014, 7:49 AM

## 2014-12-10 NOTE — Progress Notes (Addendum)
  [facility] Progress Note  Subjective  - Feels ok.    Objective Filed Vitals:   12/10/14 0614  BP: 120/59  Pulse: 73  Temp: 97.5 F (36.4 C)  Resp: 18    Intake/Output Summary (Last 24 hours) at 12/10/14 1021 Last data filed at 12/09/14 2136  Gross per 24 hour  Intake    327 ml  Output   2500 ml  Net  -2173 ml   Right AKA dressing removed with large amount of clot in dressing.  Mild active oozing towards medial aspect of incision.  Left shin and foot wounds clean. Does not appear infected.   Assessment/Planning: 49 y.o. male with large wound to left leg and foot.   Arteriogram cancelled today due to high INR. Rescheduled for Friday with Dr. Darrick Penna. Hold heparin today due to oozing of right AKA. Change dressing to right AKA if soiled.  For HD tomorrow per usual TTS schedule.  Continue empiric abx. Leukocytosis improving.  Venous duplex to rule out left DVT still pending.   Raymond Gurney 12/10/2014 10:21 AM --  Laboratory CBC    Component Value Date/Time   WBC 11.9* 12/10/2014 0417   HGB 7.6* 12/10/2014 0417   HCT 24.7* 12/10/2014 0417   PLT 138* 12/10/2014 0417    BMET    Component Value Date/Time   NA 132* 12/10/2014 0417   K 3.5 12/10/2014 0417   CL 96* 12/10/2014 0417   CO2 24 12/10/2014 0417   GLUCOSE 126* 12/10/2014 0417   BUN 33* 12/10/2014 0417   CREATININE 3.26* 12/10/2014 0417   CALCIUM 8.1* 12/10/2014 0417   GFRNONAA 21* 12/10/2014 0417   GFRAA 24* 12/10/2014 0417    COAG Lab Results  Component Value Date   INR 1.79* 12/10/2014   INR 1.86* 12/09/2014   INR 2.27* 12/09/2014   No results found for: PTT  Antibiotics Anti-infectives    Start     Dose/Rate Route Frequency Ordered Stop   12/09/14 1200  vancomycin (VANCOCIN) IVPB 750 mg/150 ml premix     750 mg 150 mL/hr over 60 Minutes Intravenous Every T-Th-Sa (Hemodialysis) 12/08/14 1504     12/08/14 1400  piperacillin-tazobactam (ZOSYN) IVPB 2.25 g     2.25  g 100 mL/hr over 30 Minutes Intravenous 3 times per day 12/08/14 1336     12/08/14 1345  vancomycin (VANCOCIN) 1,500 mg in sodium chloride 0.9 % 500 mL IVPB     1,500 mg 250 mL/hr over 120 Minutes Intravenous  Once 12/08/14 1336 12/08/14 1754       Maris Berger, PA-C [facility] Office: [phone] Pager: [phone] 12/10/2014 10:21 AM   Continue to hold coumadin.  Repeat INR in am Agram for Friday Continue local wound care Hold on heparin for now with ooze noted from AKA today Transfuse PRBC tomorrow with dialysis as most likely will have procedure with expected blood loss this admission  Fabienne Bruns, MD [facility] [place] Office: [phone] Pager: [phone]

## 2014-12-10 NOTE — Progress Notes (Signed)
VASCULAR LAB PRELIMINARY  PRELIMINARY  PRELIMINARY  PRELIMINARY  Left lower extremity venous duplex completed.    Preliminary report: Left:  No evidence of DVT, superficial thrombosis, or Baker's cyst. 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  Kymberli Wiegand, RVS 12/10/2014, 11:55 AM

## 2014-12-10 NOTE — Progress Notes (Signed)
Patient ID: [name], male   DOB: [birth year], 49 y.o.   MRN: [id number]  [facility] Progress Note   Assessment/ Plan:   1. Left foot ischemic wounds: On empiric vancomycin and Zosyn for management of possible infectious component of leg wounds. Plans for left lower extremity arteriogram today to determine possibility for revascularization versus plans for amputation. 2.ESRD he does not have any acute dialysis needs at this time and underwent hemodialysis yesterday-will continue on TTS HD schedule at this time 3. Anemia:secondary to malnutrition-inflammation complex and ESA resistance--continue him on intravenous iron and Aranesp, may need packed red cell transfusion if amputation planned. 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 Malnutrition:malnourished and hypoalbuminemic due to leg ulcers- suspect will impair wound healing 6. Hypertension:BP well controlled  Subjective:   States that he was comfortable overnight and somewhat anxious about upcoming procedure this morning    Objective:   BP 120/59 mmHg  Pulse 73  Temp(Src) 97.5 F (36.4 C) (Axillary)  Resp 18  Wt 67.4 kg (148 lb 9.4 oz)  SpO2 98%  Physical Exam: WUJ:WJXBJYNWGNF resting in bed, significant other at bedside AOZ:HYQMV RRR, normal s1 and s2 Resp:CTA bilaterally, no rales/rhonchi HQI:ONGE, obese, NT, BS normal XBM:WUXL leg in gauze dressing with some staining of her forefoot. S/p right AKA.  Labs: BMET  Recent Labs Lab 12/03/14 1153 12/08/14 1338 12/09/14 0755 12/10/14 0417  NA 135 133* 132* 132*  K 5.2* 5.2* 4.5 3.5  CL 96* 93* 90* 96*  CO2 GLUCOSE 178* 146* 126* 126*  BUN 32* 55* 61* 33*  CREATININE 3.74* 4.83* 5.27* 3.26*  CALCIUM 8.5* 8.8* 8.5* 8.1*  PHOS  --   --  5.7*  --    CBC  Recent Labs Lab 12/03/14 1153 12/08/14 1338 12/09/14 0755 12/10/14 0417  WBC 11.0* 15.9* 14.9* 11.9*  NEUTROABS  --  14.5*  --   --   HGB 7.9*  7.9* 7.6* 7.6*  HCT 25.0* 25.2* 24.4* 24.7*  MCV 94.3 95.8 95.7 96.5  PLT 125* 151 157 138*   Medications:    . sodium chloride   Intravenous Once  . sodium chloride   Intravenous Once  . amiodarone  400 mg Oral BID  . calcium acetate  1,334 mg Oral TID WC  . calcium acetate  1,334 mg Oral TID WC  . darbepoetin (ARANESP) injection - DIALYSIS  200 mcg Intravenous Q Tue-HD  . feeding supplement (NEPRO CARB STEADY)  237 mL Oral TID BM  . ferric gluconate (FERRLECIT/NULECIT) IV  125 mg Intravenous Q Tue-HD  . ferrous sulfate  325 mg Oral Daily  . folic acid  1 mg Oral Daily  . heparin  5,000 Units Subcutaneous 3 times per day  . insulin aspart  0-9 Units Subcutaneous TID WC  . midodrine  10 mg Oral Q T,Th,Sa-HD  . multivitamin  1 tablet Oral QHS  . OxyCODONE  15 mg Oral Q12H  . pantoprazole  40 mg Oral Daily  . piperacillin-tazobactam (ZOSYN)  IV  2.25 g Intravenous 3 times per day  . vancomycin  750 mg Intravenous Q T,Th,Sa-HD   Zetta Bills, MD 12/10/2014, 7:39 AM

## 2014-12-11 LAB — BASIC METABOLIC PANEL
ANION GAP: 15 (ref 5–15)
BUN: 46 mg/dL — ABNORMAL HIGH (ref 6–20)
CHLORIDE: 95 mmol/L — AB (ref 101–111)
CO2: 26 mmol/L (ref 22–32)
Calcium: 8.7 mg/dL — ABNORMAL LOW (ref 8.9–10.3)
Creatinine, Ser: 4.49 mg/dL — ABNORMAL HIGH (ref 0.61–1.24)
GFR calc non Af Amer: 14 mL/min — ABNORMAL LOW (ref >60)
GFR, EST AFRICAN AMERICAN: 16 mL/min — AB (ref >60)
Glucose, Bld: 154 mg/dL — ABNORMAL HIGH (ref 65–99)
POTASSIUM: 3.6 mmol/L (ref 3.5–5.1)
Sodium: 136 mmol/L (ref 135–145)

## 2014-12-11 LAB — GLUCOSE, CAPILLARY
GLUCOSE-CAPILLARY: 101 mg/dL — AB (ref 65–99)
Glucose-Capillary: 152 mg/dL — ABNORMAL HIGH (ref 65–99)
Glucose-Capillary: 113 mg/dL — ABNORMAL HIGH (ref 65–99)

## 2014-12-11 LAB — CBC
HEMATOCRIT: 27.1 % — AB (ref 39.0–52.0)
HEMOGLOBIN: 8.4 g/dL — AB (ref 13.0–17.0)
MCH: 29.7 pg (ref 26.0–34.0)
MCHC: 31 g/dL (ref 30.0–36.0)
MCV: 95.8 fL (ref 78.0–100.0)
Platelets: 182 10*3/uL (ref 150–400)
RBC: 2.83 MIL/uL — AB (ref 4.22–5.81)
RDW: 18.1 % — ABNORMAL HIGH (ref 11.5–15.5)
WBC: 11.6 10*3/uL — ABNORMAL HIGH (ref 4.0–10.5)

## 2014-12-11 LAB — PROTIME-INR
INR: 1.81 — AB (ref 0.00–1.49)
Prothrombin Time: 20.9 seconds — ABNORMAL HIGH (ref 11.6–15.2)

## 2014-12-11 LAB — HEMOGLOBIN A1C
Hgb A1c MFr Bld: 5.5 % (ref 4.8–5.6)
MEAN PLASMA GLUCOSE: 111 mg/dL

## 2014-12-11 MED ORDER — OXYCODONE HCL 5 MG PO TABS
ORAL_TABLET | ORAL | Status: AC
  Filled 2014-12-11: qty 2

## 2014-12-11 MED ORDER — VITAMIN K1 10 MG/ML IJ SOLN
10.0000 mg | Freq: Once | INTRAVENOUS | Status: AC
  Administered 2014-12-11: 10 mg via INTRAVENOUS
  Filled 2014-12-11: qty 1

## 2014-12-11 MED ORDER — BOOST / RESOURCE BREEZE PO LIQD
1.0000 | Freq: Three times a day (TID) | ORAL | Status: DC
  Administered 2014-12-11 – 2014-12-14 (×6): 1 via ORAL

## 2014-12-11 NOTE — Progress Notes (Addendum)
Initial Nutrition Assessment  DOCUMENTATION CODES:   Severe malnutrition in context of chronic illness  INTERVENTION:   Boost Breeze po TID, each supplement provides 250 kcal and 9 grams of protein  NUTRITION DIAGNOSIS:   Increased nutrient needs related to chronic illness as evidenced by estimated needs  GOAL:   Patient will meet greater than or equal to 90% of their needs  MONITOR:   PO intake, Supplement acceptance, Weight trends, Labs, I & O's  REASON FOR ASSESSMENT:   Malnutrition Screening Tool  ASSESSMENT:   49 y.o. year old male who presents for evaluation of left foot wound. Pt known to me from prior right above knee amputation and revision a few weeks ago. Now with worsening wound left foot. He has had a chronic pretibial ulcer as well. Pt admits to scraping and scuffing foot frequently while moving around in his wheelchair. He remains severely malnourished and has had difficulty healing wounds secondary to this. Other medical problems include CAD, DM, ESRD T Th Sat dialysis tobacco abuse. He is on coumadin for afib.  RD known to this RD during previous hospital admission.  Pt with hx of malnutrition which is ongoing.  States his appetite would be "good if he got something worth eating".  He reports he typically snacks during the day (vs eat consistent meals).  Does not like Nepro Shake supplements as it gave him gas, bloating and belching.  Amenable to orange flavored Boost Breeze.  RD to order.  Nutrition-Focused physical exam completed. Findings are severe fat depletion, severe muscle depletion, and mild edema.   Diet Order:  Diet renal/carb modified with fluid restriction Diet-HS Snack?: Nothing; Room service appropriate?: Yes; Fluid consistency:: Thin Diet NPO time specified Except for: Sips with Meds  Skin:  Unstageable pressure ulcer on sacrum  Last BM:  8/17  Height:   Ht Readings from Last 1 Encounters:  12/10/14  (1.803 m)    Weight:    Wt Readings from Last 1 Encounters:  12/11/14 150 lb 5.7 oz (68.2 kg)    Ideal Body Weight:  78 kg  BMI:  Body mass index is 20.98 kg/(m^2).  Estimated Nutritional Needs:   Kcal:  2100-2300  Protein:  105-115 gm  Fluid:  per MD  EDUCATION NEEDS:   No education needs identified at this time  Maureen Chatters, RD, LDN Pager #: [phone] After-Hours Pager #: [phone]

## 2014-12-11 NOTE — Progress Notes (Signed)
ANTIBIOTIC CONSULT NOTE - FOLLOW UP  Pharmacy Consult for Vanc/Zosyn Indication: cellulits  No Known Allergies  Patient Measurements: Height: 5\' 11"  (180.3 cm) Weight: 150 lb 5.7 oz (68.2 kg) IBW/kg (Calculated) : 75.3  Vital Signs: Temp: 97.7 F (36.5 C) (08/18 0541) Temp Source: Oral (08/18 0541) BP: 121/59 mmHg (08/18 1000) Pulse Rate: 74 (08/18 0541) Intake/Output from previous day: 08/17 0701 - 08/18 0700 In: 820 [P.O.:720; IV Piggyback:100] Out: 0  Intake/Output from this shift:    Labs:  Recent Labs  12/09/14 0755 12/10/14 0417 12/11/14 0433  WBC 14.9* 11.9* 11.6*  HGB 7.6* 7.6* 8.4*  PLT 157 138* 182  CREATININE 5.27* 3.26* 4.49*   Estimated Creatinine Clearance: 19 mL/min (by C-G formula based on Cr of 4.49). No results for input(s): VANCOTROUGH, VANCOPEAK, VANCORANDOM, GENTTROUGH, GENTPEAK, GENTRANDOM, TOBRATROUGH, TOBRAPEAK, TOBRARND, AMIKACINPEAK, AMIKACINTROU, AMIKACIN in the last 72 hours.   Microbiology: Recent Results (from the past 720 hour(s))  MRSA PCR Screening     Status: None   Collection Time: 12/03/14  2:34 AM  Result Value Ref Range Status   MRSA by PCR NEGATIVE NEGATIVE Final    Comment:        The GeneXpert MRSA Assay (FDA approved for NASAL specimens only), is one component of a comprehensive MRSA colonization surveillance program. It is not intended to diagnose MRSA infection nor to guide or monitor treatment for MRSA infections.   Blood Culture (routine x 2)     Status: None (Preliminary result)   Collection Time: 12/08/14  1:38 PM  Result Value Ref Range Status   Specimen Description BLOOD RIGHT ANTECUBITAL  Final   Special Requests BOTTLES DRAWN AEROBIC AND ANAEROBIC 5CC  Final   Culture NO GROWTH 2 DAYS  Final   Report Status PENDING  Incomplete  Blood Culture (routine x 2)     Status: None (Preliminary result)   Collection Time: 12/08/14  1:58 PM  Result Value Ref Range Status   Specimen Description BLOOD RIGHT ARM   Final   Special Requests BOTTLES DRAWN AEROBIC AND ANAEROBIC 5CC  Final   Culture NO GROWTH 2 DAYS  Final   Report Status PENDING  Incomplete  Urine culture     Status: None   Collection Time: 12/08/14  7:15 PM  Result Value Ref Range Status   Specimen Description URINE, RANDOM  Final   Special Requests NONE  Final   Culture MULTIPLE SPECIES PRESENT, SUGGEST RECOLLECTION  Final   Report Status 12/10/2014 FINAL  Final    Anti-infectives    Start     Dose/Rate Route Frequency Ordered Stop   12/09/14 1200  vancomycin (VANCOCIN) IVPB 750 mg/150 ml premix     750 mg 150 mL/hr over 60 Minutes Intravenous Every T-Th-Sa (Hemodialysis) 12/08/14 1504     12/08/14 1400  piperacillin-tazobactam (ZOSYN) IVPB 2.25 g     2.25 g 100 mL/hr over 30 Minutes Intravenous 3 times per day 12/08/14 1336     12/08/14 1345  vancomycin (VANCOCIN) 1,500 mg in sodium chloride 0.9 % 500 mL IVPB     1,500 mg 250 mL/hr over 120 Minutes Intravenous  Once 12/08/14 1336 12/08/14 1754      Assessment: 49 yom from [facility] ESRD - HD TTS presenting with increased pain in R leg, decreased pulse in L foot. 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 Pharmacy consulted to dose vanc/zosyn for cellulitis. vanc/zosyn for cellulitis. ESRD - HD TTS. Afebrile, WBC 11.6. No growth in urine/blood cultures.   Goal  of Therapy:  Random vanc level 15-25  Plan:  Continue Vanc 750 mg IV qHD (TThSat) Continue Zosyn 2.25g IV q8h F/u HD schedule; currently following home HD schedule (TThSat) Monitor clinical prograss, c/s, abx plan Check random vanc level as needed.   Sandi Carne, PharmD Pharmacy Resident Pager: [phone] 12/11/2014,12:25 PM

## 2014-12-11 NOTE — Procedures (Signed)
Patient seen on Hemodialysis. QB 400, UF goal 3.5L Treatment adjusted as needed.  Zetta Bills MD [facility]. Office # [phone] Pager # [phone] 4:42 PM

## 2014-12-11 NOTE — Progress Notes (Signed)
12/11/2014 1000 Dr. Darrick Penna at bedside verbal order to continue to hold heparin SQ today due to R AKA stump intermittent bleeding. Unable to place medication on "hold in EPIC". Order d/c per protocol and oncoming RN updated to please re-address in AM with MD to evaluate appropriateness to restart medication at that time. Also at this time verbal order received ok to utilize petroleum gauze on left foot wound for dressing change instead of xeroform gauze. Orders enacted. Will continue to closely monitor patient.  [name], Blanchard Kelch

## 2014-12-11 NOTE — Care Management Important Message (Signed)
Important Message  Patient Details  Name: [name] MRN: [id number] Date of Birth: 01-03-1966   Medicare Important Message Given:  Yes-second notification given    Kyla Balzarine 12/11/2014, 12:55 PMImportant Message  Patient Details  Name: [name] MRN: [id number] Date of Birth: [birth year]   Medicare Important Message Given:  Yes-second notification given    Kyla Balzarine 12/11/2014, 12:54 PM

## 2014-12-11 NOTE — Progress Notes (Signed)
[facility] [place]  Subjective  - feels ok    Objective 126/59 74 97.7 F (36.5 C) (Oral) 17 100%  Intake/Output Summary (Last 24 hours) at 12/11/14 0850 Last data filed at 12/11/14 0700  Gross per 24 hour  Intake    700 ml  Output      0 ml  Net    700 ml   Dorsal left foot dark eschar bottom of foot pink Right AKA some ooze medial corner Both legs still edematous/anasarca  Assessment/Planning: Elevated INR despite 3 u FFP and prior vit K.  Will give additional vit K today Transfuse 2 U PRBC today on dialysis Agram tomorrow if INR less than 1.7 Foot very marginal and high likeliehood of limb loss Right AKA with some ooze continue to hold anticoagulation Push nutrition to improve wound healing Recheck prealbumin tomorrow  Fabienne Bruns 12/11/2014 8:50 AM --  Laboratory Lab Results:  Recent Labs  12/10/14 0417 12/11/14 0433  WBC 11.9* 11.6*  HGB 7.6* 8.4*  HCT 24.7* 27.1*  PLT 138* 182   BMET  Recent Labs  12/10/14 0417 12/11/14 0433  NA 132* 136  K 3.5 3.6  CL 96* 95*  CO2 24 26  GLUCOSE 126* 154*  BUN 33* 46*  CREATININE 3.26* 4.49*  CALCIUM 8.1* 8.7*    COAG Lab Results  Component Value Date   INR 1.81* 12/11/2014   INR 1.79* 12/10/2014   INR 1.86* 12/09/2014   No results found for: PTT

## 2014-12-11 NOTE — Progress Notes (Signed)
Patient ID: [name], male   DOB: 29-Jan-1966, 49 y.o.   MRN: [id number]  [facility] Progress Note   Assessment/ Plan:   1. Left foot ischemic wounds: On empiric vancomycin and Zosyn for management of possible infectious component of leg wounds. Arteriogram postponed because of high INR-likely to be done tomorrow 2.ESRD continue hemodialysis on a Tuesday/Thursday/Saturday schedule with hemodialysis later today-no critical electrolyte abnormality or significant volume overload 3. Anemia:secondary to malnutrition-inflammation complex and ESA resistance--continue him on intravenous iron and Aranesp, may need packed red cell transfusion if amputation planned. 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 Malnutrition:malnourished and hypoalbuminemic due to leg ulcers- suspect will impair wound healing 6. Hypertension:BP well controlled  Subjective:   No acute complaints-states that he is anxiously awaiting arteriogram    Objective:   BP 126/59 mmHg  Pulse 74  Temp(Src) 97.7 F (36.5 C) (Oral)  Resp 17  Ht  (1.803 m)  Wt 68.2 kg (150 lb 5.7 oz)  BMI 20.98 kg/m2  SpO2 100%  Physical Exam: YQM:VHQIONGEXBM resting in bed WUX:LKGMW RRR, normal s1 and s2 Resp:CTA bilaterally, no rales/rhonchi NUU:VOZD, obese, NT, BS normal GUY:QIHK leg in gauze dressing. S/p right AKA.  Labs: BMET  Recent Labs Lab 12/08/14 1338 12/09/14 0755 12/10/14 0417 12/11/14 0433  NA 133* 132* 132* 136  K 5.2* 4.5 3.5 3.6  CL 93* 90* 96* 95*  CO2 GLUCOSE 146* 126* 126* 154*  BUN 55* 61* 33* 46*  CREATININE 4.83* 5.27* 3.26* 4.49*  CALCIUM 8.8* 8.5* 8.1* 8.7*  PHOS  --  5.7*  --   --    CBC  Recent Labs Lab 12/08/14 1338 12/09/14 0755 12/10/14 0417 12/11/14 0433  WBC 15.9* 14.9* 11.9* 11.6*  NEUTROABS 14.5*  --   --   --   HGB 7.9* 7.6* 7.6* 8.4*  HCT 25.2* 24.4* 24.7* 27.1*  MCV 95.8 95.7 96.5 95.8  PLT 151 157 138* 182    Medications:    . sodium chloride   Intravenous Once  . sodium chloride   Intravenous Once  . sodium chloride   Intravenous Once  . amiodarone  400 mg Oral BID  . calcium acetate  1,334 mg Oral TID WC  . darbepoetin (ARANESP) injection - DIALYSIS  200 mcg Intravenous Q Tue-HD  . feeding supplement (NEPRO CARB STEADY)  237 mL Oral TID BM  . ferric gluconate (FERRLECIT/NULECIT) IV  125 mg Intravenous Q Tue-HD  . ferrous sulfate  325 mg Oral Daily  . folic acid  1 mg Oral Daily  . heparin  5,000 Units Subcutaneous 3 times per day  . insulin aspart  0-9 Units Subcutaneous TID WC  . midodrine  10 mg Oral Q T,Th,Sa-HD  . multivitamin  1 tablet Oral QHS  . OxyCODONE  15 mg Oral Q12H  . pantoprazole  40 mg Oral Daily  . piperacillin-tazobactam (ZOSYN)  IV  2.25 g Intravenous 3 times per day  . vancomycin  750 mg Intravenous Q T,Th,Sa-HD   Zetta Bills, MD 12/11/2014, 7:50 AM

## 2014-12-12 ENCOUNTER — Encounter (HOSPITAL_COMMUNITY)
Admission: EM | Disposition: A | Discharge status: Skilled Nursing Facility | Payer: Medicare Other | Source: Home / Self Care | Attending: Vascular Surgery

## 2014-12-12 ENCOUNTER — Encounter (HOSPITAL_COMMUNITY): Payer: Self-pay | Admitting: Vascular Surgery

## 2014-12-12 DIAGNOSIS — I70245 Atherosclerosis of native arteries of left leg with ulceration of other part of foot: Secondary | ICD-10-CM

## 2014-12-12 HISTORY — PX: PERIPHERAL VASCULAR CATHETERIZATION: SHX172C

## 2014-12-12 LAB — BASIC METABOLIC PANEL
ANION GAP: 12 (ref 5–15)
BUN: 21 mg/dL — ABNORMAL HIGH (ref 6–20)
CALCIUM: 8.5 mg/dL — AB (ref 8.9–10.3)
CHLORIDE: 97 mmol/L — AB (ref 101–111)
CO2: 27 mmol/L (ref 22–32)
Creatinine, Ser: 2.91 mg/dL — ABNORMAL HIGH (ref 0.61–1.24)
GFR calc non Af Amer: 24 mL/min — ABNORMAL LOW (ref >60)
GFR, EST AFRICAN AMERICAN: 27 mL/min — AB (ref >60)
Glucose, Bld: 114 mg/dL — ABNORMAL HIGH (ref 65–99)
POTASSIUM: 3.9 mmol/L (ref 3.5–5.1)
Sodium: 136 mmol/L (ref 135–145)

## 2014-12-12 LAB — TYPE AND SCREEN
ABO/RH(D): O POS
Antibody Screen: NEGATIVE
UNIT DIVISION: 0
Unit division: 0

## 2014-12-12 LAB — CBC
HEMATOCRIT: 32.6 % — AB (ref 39.0–52.0)
HEMOGLOBIN: 10.4 g/dL — AB (ref 13.0–17.0)
MCH: 29.8 pg (ref 26.0–34.0)
MCHC: 31.9 g/dL (ref 30.0–36.0)
MCV: 93.4 fL (ref 78.0–100.0)
Platelets: 158 10*3/uL (ref 150–400)
RBC: 3.49 MIL/uL — AB (ref 4.22–5.81)
RDW: 18 % — ABNORMAL HIGH (ref 11.5–15.5)
WBC: 9.7 10*3/uL (ref 4.0–10.5)

## 2014-12-12 LAB — GLUCOSE, CAPILLARY
GLUCOSE-CAPILLARY: 126 mg/dL — AB (ref 65–99)
Glucose-Capillary: 161 mg/dL — ABNORMAL HIGH (ref 65–99)
Glucose-Capillary: 124 mg/dL — ABNORMAL HIGH (ref 65–99)
Glucose-Capillary: 126 mg/dL — ABNORMAL HIGH (ref 65–99)

## 2014-12-12 LAB — PROTIME-INR
INR: 1.36 (ref 0.00–1.49)
Prothrombin Time: 16.9 seconds — ABNORMAL HIGH (ref 11.6–15.2)

## 2014-12-12 LAB — PREALBUMIN: Prealbumin: 7.7 mg/dL — ABNORMAL LOW (ref 18–38)

## 2014-12-12 SURGERY — ABDOMINAL AORTOGRAM

## 2014-12-12 MED ORDER — HEPARIN (PORCINE) IN NACL 2-0.9 UNIT/ML-% IJ SOLN
INTRAMUSCULAR | Status: AC
  Filled 2014-12-12: qty 500

## 2014-12-12 MED ORDER — LIDOCAINE HCL (PF) 1 % IJ SOLN
INTRAMUSCULAR | Status: DC | PRN
  Administered 2014-12-12: 11 mL

## 2014-12-12 MED ORDER — ONDANSETRON HCL 4 MG/2ML IJ SOLN: 4.0000 mg | Freq: Four times a day (QID) | INTRAMUSCULAR | Status: DC | PRN

## 2014-12-12 MED ORDER — LABETALOL HCL 5 MG/ML IV SOLN: 10.0000 mg | INTRAVENOUS | Status: DC | PRN

## 2014-12-12 MED ORDER — METOPROLOL TARTRATE 1 MG/ML IV SOLN: 2.0000 mg | INTRAVENOUS | Status: DC | PRN

## 2014-12-12 MED ORDER — FENTANYL CITRATE (PF) 100 MCG/2ML IJ SOLN
INTRAMUSCULAR | Status: DC | PRN
  Administered 2014-12-12: 12.5 ug via INTRAVENOUS

## 2014-12-12 MED ORDER — HEPARIN SODIUM (PORCINE) 5000 UNIT/ML IJ SOLN: 5000.0000 [IU] | Freq: Three times a day (TID) | INTRAMUSCULAR | Status: DC

## 2014-12-12 MED ORDER — HYDRALAZINE HCL 20 MG/ML IJ SOLN: 5.0000 mg | INTRAMUSCULAR | Status: DC | PRN

## 2014-12-12 MED ORDER — HEPARIN SODIUM (PORCINE) 5000 UNIT/ML IJ SOLN
5000.0000 [IU] | Freq: Three times a day (TID) | INTRAMUSCULAR | Status: DC
  Administered 2014-12-13 – 2014-12-15 (×6): 5000 [IU] via SUBCUTANEOUS
  Filled 2014-12-12 (×5): qty 1

## 2014-12-12 MED ORDER — LIDOCAINE HCL (PF) 1 % IJ SOLN
INTRAMUSCULAR | Status: AC
  Filled 2014-12-12: qty 30

## 2014-12-12 MED ORDER — FENTANYL CITRATE (PF) 100 MCG/2ML IJ SOLN
INTRAMUSCULAR | Status: AC
  Filled 2014-12-12: qty 4

## 2014-12-12 SURGICAL SUPPLY — 12 items
CATH ANGIO 5F PIGTAIL 65CM (CATHETERS) ×1 IMPLANT
CATH CROSS OVER TEMPO 5F (CATHETERS) ×1 IMPLANT
CATH STRAIGHT 5FR 65CM (CATHETERS) ×1 IMPLANT
COVER PRB 48X5XTLSCP FOLD TPE (BAG) IMPLANT
COVER PROBE 5X48 (BAG) ×2
GUIDEWIRE ANGLED .035X150CM (WIRE) ×1 IMPLANT
KIT PV (KITS) ×2 IMPLANT
SHEATH PINNACLE 5F 10CM (SHEATH) ×1 IMPLANT
SYR MEDRAD MARK V 150ML (SYRINGE) ×2 IMPLANT
TRANSDUCER W/STOPCOCK (MISCELLANEOUS) ×2 IMPLANT
TRAY PV CATH (CUSTOM PROCEDURE TRAY) ×2 IMPLANT
WIRE HITORQ VERSACORE ST 145CM (WIRE) ×1 IMPLANT

## 2014-12-12 NOTE — H&P (View-Only) (Signed)
[facility] [place]  Subjective  - feels ok    Objective 126/59 74 97.7 F (36.5 C) (Oral) 17 100%  Intake/Output Summary (Last 24 hours) at 12/11/14 0850 Last data filed at 12/11/14 0700  Gross per 24 hour  Intake    700 ml  Output      0 ml  Net    700 ml   Dorsal left foot dark eschar bottom of foot pink Right AKA some ooze medial corner Both legs still edematous/anasarca  Assessment/Planning: Elevated INR despite 3 u FFP and prior vit K.  Will give additional vit K today Transfuse 2 U PRBC today on dialysis Agram tomorrow if INR less than 1.7 Foot very marginal and high likeliehood of limb loss Right AKA with some ooze continue to hold anticoagulation Push nutrition to improve wound healing Recheck prealbumin tomorrow  Fields, Charles 12/11/2014 8:50 AM --  Laboratory Lab Results:  Recent Labs  12/10/14 0417 12/11/14 0433  WBC 11.9* 11.6*  HGB 7.6* 8.4*  HCT 24.7* 27.1*  PLT 138* 182   BMET  Recent Labs  12/10/14 0417 12/11/14 0433  NA 132* 136  K 3.5 3.6  CL 96* 95*  CO2 24 26  GLUCOSE 126* 154*  BUN 33* 46*  CREATININE 3.26* 4.49*  CALCIUM 8.1* 8.7*    COAG Lab Results  Component Value Date   INR 1.81* 12/11/2014   INR 1.79* 12/10/2014   INR 1.86* 12/09/2014   No results found for: PTT      

## 2014-12-12 NOTE — Progress Notes (Signed)
UR Completed. Macdonald Rigor, RN, BSN.  [phone] 

## 2014-12-12 NOTE — Progress Notes (Signed)
Left upper arm area with one suture. As patient laid down in bed that area drain large amt. Of clean fl and is no longer swollen. Still redden.

## 2014-12-12 NOTE — Interval H&P Note (Signed)
History and Physical Interval Note:  12/12/2014 10:52 AM  [name]  has presented today for surgery, with the diagnosis of pvd with left foot ulcer  The various methods of treatment have been discussed with the patient and family. After consideration of risks, benefits and other options for treatment, the patient has consented to  Procedure(s): Abdominal Aortogram (N/A) as a surgical intervention .  The patient's history has been reviewed, patient examined, no change in status, stable for surgery.  I have reviewed the patient's chart and labs.  Questions were answered to the patient's satisfaction.     Fabienne Bruns

## 2014-12-12 NOTE — Progress Notes (Signed)
Pt not a candidate for bypass No percutaneous revasc options Have offered him left AKA He wants to wait for now. Emphasized that without a feeding tube I do not believe he can heal anything at this point  Dr Edilia Bo on call over the weekend and can order nasoenteric tube if pt consents. Will resume heparin tomorrow and coumadin on Monday if pt still refusing AKA  If continues to refuse AKA will plan for d/c back to SNF on Monday  Fabienne Bruns, MD [facility] [place] Office: [phone] Pager: [phone]

## 2014-12-12 NOTE — Progress Notes (Signed)
Subjective: Interval History: has complaints wants to know what is going to happen, does not want amp now even if needed.  Objective: Vital signs in last 24 hours: Temp:  [97.8 F (36.6 C)-98.1 F (36.7 C)] 98 F (36.7 C) (08/19 0509) Pulse Rate:  [62-81] 78 (08/19 0509) Resp:  [16-26] 18 (08/19 0509) BP: (117-139)/(59-85) 131/82 mmHg (08/19 0509) SpO2:  [100 %] 100 % (08/19 0509) Weight:  [67.5 kg (148 lb 13 oz)-67.8 kg (149 lb 7.6 oz)] 67.5 kg (148 lb 13 oz) (08/19 0509) Weight change: -0.4 kg (-14.1 oz)  Intake/Output from previous day: 08/18 0701 - 08/19 0700 In: 862 [P.O.:240; Blood:422; IV Piggyback:200] Out: 21 [Urine:20; Stool:1] Intake/Output this shift:    General appearance: cooperative, cachectic and pale Resp: diminished breath sounds bibasilar and rales bibasilar Chest wall: RIJ cath Cardio: S1, S2 normal and systolic murmur: systolic ejection 2/6, decrescendo at 2nd left intercostal space GI: mild distension, pos bs, soft,liver down 4 cm Extremities: AVF LUA B&T, R AKA, L foot dressed kne down   Lab Results:  Recent Labs  12/11/14 0433 12/12/14 0304  WBC 11.6* 9.7  HGB 8.4* 10.4*  HCT 27.1* 32.6*  PLT 182 158   BMET:  Recent Labs  12/11/14 0433 12/12/14 0304  NA 136 136  K 3.6 3.9  CL 95* 97*  CO2 26 27  GLUCOSE 154* 114*  BUN 46* 21*  CREATININE 4.49* 2.91*  CALCIUM 8.7* 8.5*   No results for input(s): PTH in the last 72 hours. Iron Studies: No results for input(s): IRON, TIBC, TRANSFERRIN, FERRITIN in the last 72 hours.  Studies/Results: No results found.  I have reviewed the patient's current medications.  Assessment/Plan: 1 ESRD for HD in am, AVF immature 2 DM controlled 3 Anemia ESA/Fe 4 HPTH off meds 5 PVD per VVS 6 Malnutrition 7 HTN controlled 8CAD P HD, ESA, Angio per VVS, DM control,     LOS: 4 days   [name] L 12/12/2014,7:37 AM

## 2014-12-12 NOTE — Progress Notes (Signed)
Left upper arm with one suture is redden and swollen. Left B and T present.

## 2014-12-12 NOTE — Care Management Important Message (Signed)
Important Message  Patient Details  Name: [name] MRN: [id number] Date of Birth: March 03, 1966   Medicare Important Message Given:  Yes-second notification given    Cherylann Parr, RN 12/12/2014, 11:46 AM

## 2014-12-13 LAB — RENAL FUNCTION PANEL
ANION GAP: 12 (ref 5–15)
Albumin: 1.9 g/dL — ABNORMAL LOW (ref 3.5–5.0)
BUN: 40 mg/dL — ABNORMAL HIGH (ref 6–20)
CALCIUM: 8.5 mg/dL — AB (ref 8.9–10.3)
CO2: 26 mmol/L (ref 22–32)
Chloride: 95 mmol/L — ABNORMAL LOW (ref 101–111)
Creatinine, Ser: 3.9 mg/dL — ABNORMAL HIGH (ref 0.61–1.24)
GFR, EST AFRICAN AMERICAN: 19 mL/min — AB (ref >60)
GFR, EST NON AFRICAN AMERICAN: 17 mL/min — AB (ref >60)
Glucose, Bld: 126 mg/dL — ABNORMAL HIGH (ref 65–99)
Phosphorus: 3.7 mg/dL (ref 2.5–4.6)
Potassium: 4.3 mmol/L (ref 3.5–5.1)
SODIUM: 133 mmol/L — AB (ref 135–145)

## 2014-12-13 LAB — CULTURE, BLOOD (ROUTINE X 2)
Culture: NO GROWTH
Culture: NO GROWTH

## 2014-12-13 LAB — CBC
HCT: 30.3 % — ABNORMAL LOW (ref 39.0–52.0)
HEMOGLOBIN: 9.7 g/dL — AB (ref 13.0–17.0)
MCH: 29.8 pg (ref 26.0–34.0)
MCHC: 32 g/dL (ref 30.0–36.0)
MCV: 93.2 fL (ref 78.0–100.0)
PLATELETS: 135 10*3/uL — AB (ref 150–400)
RBC: 3.25 MIL/uL — AB (ref 4.22–5.81)
RDW: 17.7 % — ABNORMAL HIGH (ref 11.5–15.5)
WBC: 9.2 10*3/uL (ref 4.0–10.5)

## 2014-12-13 LAB — GLUCOSE, CAPILLARY
GLUCOSE-CAPILLARY: 141 mg/dL — AB (ref 65–99)
GLUCOSE-CAPILLARY: 136 mg/dL — AB (ref 65–99)
Glucose-Capillary: 121 mg/dL — ABNORMAL HIGH (ref 65–99)

## 2014-12-13 LAB — PROTIME-INR
INR: 1.39 (ref 0.00–1.49)
PROTHROMBIN TIME: 17.1 s — AB (ref 11.6–15.2)

## 2014-12-13 MED ORDER — PENTAFLUOROPROP-TETRAFLUOROETH EX AERO: 1.0000 "application " | INHALATION_SPRAY | CUTANEOUS | Status: DC | PRN

## 2014-12-13 MED ORDER — LIDOCAINE-PRILOCAINE 2.5-2.5 % EX CREA: 1.0000 "application " | TOPICAL_CREAM | CUTANEOUS | Status: DC | PRN

## 2014-12-13 MED ORDER — NEPRO/CARBSTEADY PO LIQD: 237.0000 mL | ORAL | Status: DC | PRN

## 2014-12-13 MED ORDER — LIDOCAINE HCL (PF) 1 % IJ SOLN: 5.0000 mL | INTRAMUSCULAR | Status: DC | PRN

## 2014-12-13 MED ORDER — SODIUM CHLORIDE 0.9 % IV SOLN: 100.0000 mL | INTRAVENOUS | Status: DC | PRN

## 2014-12-13 MED ORDER — OXYCODONE HCL 5 MG PO TABS
ORAL_TABLET | ORAL | Status: AC
  Filled 2014-12-13: qty 2

## 2014-12-13 MED ORDER — ALTEPLASE 2 MG IJ SOLR
2.0000 mg | Freq: Once | INTRAMUSCULAR | Status: DC | PRN
  Filled 2014-12-13: qty 2

## 2014-12-13 MED ORDER — HEPARIN SODIUM (PORCINE) 1000 UNIT/ML DIALYSIS: 1000.0000 [IU] | INTRAMUSCULAR | Status: DC | PRN

## 2014-12-13 MED ORDER — MIDODRINE HCL 5 MG PO TABS
ORAL_TABLET | ORAL | Status: AC
  Filled 2014-12-13: qty 2

## 2014-12-13 MED ORDER — HEPARIN SODIUM (PORCINE) 1000 UNIT/ML DIALYSIS: 100.0000 [IU]/kg | INTRAMUSCULAR | Status: DC | PRN

## 2014-12-13 NOTE — Progress Notes (Signed)
Subjective: Interval History: has complaints , a lot of gas, concerned about leg.  Objective: Vital signs in last 24 hours: Temp:  [97.4 F (36.3 C)-98.1 F (36.7 C)] 97.8 F (36.6 C) (08/20 0552) Pulse Rate:  [65-79] 65 (08/20 0552) Resp:  [9-80] 12 (08/20 0552) BP: (113-156)/(63-87) 113/63 mmHg (08/20 0552) SpO2:  [91 %-100 %] 99 % (08/20 0552) Weight:  [71.8 kg (158 lb 4.6 oz)] 71.8 kg (158 lb 4.6 oz) (08/20 0552) Weight change: 4 kg (8 lb 13.1 oz)  Intake/Output from previous day: 08/19 0701 - 08/20 0700 In: 342 [P.O.:342] Out: -  Intake/Output this shift:    General appearance: alert, cooperative, cachectic and pale Resp: diminished breath sounds bilaterally and rales bibasilar Chest wall: R IJ cath Cardio: systolic murmur: holosystolic 2/6, blowing at apex GI: mod distension, pos bs,soft Extremities: edema 2-3+ presacral, R AKA , dressing to knee L leg, AVF LUA  2 mon old and sores on arms  Lab Results:  Recent Labs  12/11/14 0433 12/12/14 0304  WBC 11.6* 9.7  HGB 8.4* 10.4*  HCT 27.1* 32.6*  PLT 182 158   BMET:  Recent Labs  12/11/14 0433 12/12/14 0304  NA 136 136  K 3.6 3.9  CL 95* 97*  CO2 26 27  GLUCOSE 154* 114*  BUN 46* 21*  CREATININE 4.49* 2.91*  CALCIUM 8.7* 8.5*   No results for input(s): PTH in the last 72 hours. Iron Studies: No results for input(s): IRON, TIBC, TRANSFERRIN, FERRITIN in the last 72 hours.  Studies/Results: No results found.  I have reviewed the patient's current medications.  Assessment/Plan: 1 ESRD for HD, vol xs but bp not ^,  2 HPTH meds 3 Anemia esa/fe 4 DM controlled 5 malnutrition suppl 6 Cellulitis can use Pos 7 PVD needs L amp 8 CAD 9 Hx afib ,no proven benefit to Coumadin, rate controlled P HD, lower vol, esa, suppl. Counsel on leg    LOS: 5 days   [name] L 12/13/2014,7:06 AM

## 2014-12-13 NOTE — Progress Notes (Addendum)
[facility] [place]  Subjective  - He states he is not ready to have another amputation yet.     Objective 122/66 66 97.8 F (36.6 C) (Oral) 12 99%  Intake/Output Summary (Last 24 hours) at 12/13/14 0810 Last data filed at 12/12/14 2116  Gross per 24 hour  Intake    342 ml  Output      0 ml  Net    342 ml    Right groin soft without hematoma   Assessment/Planning: POD # 1 angiogram Pt not a candidate for bypass No percutaneous revasc options Have offered him left AKA He wants to wait for now.   [facility] 12/13/2014 8:10 AM --  Laboratory Lab Results:  Recent Labs  12/11/14 0433 12/12/14 0304  WBC 11.6* 9.7  HGB 8.4* 10.4*  HCT 27.1* 32.6*  PLT 182 158   BMET  Recent Labs  12/11/14 0433 12/12/14 0304  NA 136 136  K 3.6 3.9  CL 95* 97*  CO2 26 27  GLUCOSE 154* 114*  BUN 46* 21*  CREATININE 4.49* 2.91*  CALCIUM 8.7* 8.5*    COAG Lab Results  Component Value Date   INR 1.39 12/13/2014   INR 1.36 12/12/2014   INR 1.81* 12/11/2014   No results found for: PTT   Agree with above.  On HD. Still not wanting to proceed with left AKA.  Waverly Ferrari, MD, FACS [facility] [phone]

## 2014-12-13 NOTE — Procedures (Signed)
I was present at this session.  I have reviewed the session itself and made appropriate changes.  HD via RIJ PC.  BP low 100s.  [name] L 8/20/20168:06 AM

## 2014-12-14 LAB — GLUCOSE, CAPILLARY
GLUCOSE-CAPILLARY: 145 mg/dL — AB (ref 65–99)
GLUCOSE-CAPILLARY: 127 mg/dL — AB (ref 65–99)
Glucose-Capillary: 117 mg/dL — ABNORMAL HIGH (ref 65–99)
Glucose-Capillary: 142 mg/dL — ABNORMAL HIGH (ref 65–99)

## 2014-12-14 LAB — PROTIME-INR
INR: 1.28 (ref 0.00–1.49)
PROTHROMBIN TIME: 16.2 s — AB (ref 11.6–15.2)

## 2014-12-14 NOTE — Progress Notes (Signed)
ANTIBIOTIC CONSULT NOTE - FOLLOW UP  Pharmacy Consult for Vancomycin and Zosyn Indication: cellulitis  No Known Allergies  Patient Measurements: Height:  (180.3 cm) Weight: 149 lb 11.1 oz (67.9 kg) IBW/kg (Calculated) : 75.3  Vital Signs: Temp: 98.3 F (36.8 C) (08/21 1403) Temp Source: Oral (08/21 1403) BP: 120/61 mmHg (08/21 1403) Pulse Rate: 72 (08/21 1403) I Labs:  Recent Labs  12/12/14 0304 12/13/14 0906 12/13/14 0907  WBC 9.7  --  9.2  HGB 10.4*  --  9.7*  PLT 158  --  135*  CREATININE 2.91* 3.90*  --    Estimated Creatinine Clearance: 21.8 mL/min (by C-G formula based on Cr of 3.9).  Assessment:  Day # 7 Vanc and Zosyn for cellulitis.  Vanc given after HD on 8/20. Afebrile, WBC 8.2  8/15 blood cultures x 2 negative.  8/15 urine culture - mulriple species  Goal of Therapy:  pre-dialysis Vancomcyin levels 15-25 mcg/ml  Appropriate Zosyn dose for renal function and infection  Plan:   Continue Vancomycin 750 mg IV TTS after HD.  Continue Zosyn 2.25 gm IV q8hrs.  Will follow up plans.    Dennie Fetters, [place] Pager: [phone] 12/14/2014,4:00 PM

## 2014-12-14 NOTE — Progress Notes (Signed)
Subjective: Interval History: has complaints a lot of gas.  Objective: Vital signs in last 24 hours: Temp:  [97.8 F (36.6 C)-98.6 F (37 C)] 98.5 F (36.9 C) (08/21 0355) Pulse Rate:  [66-80] 72 (08/21 0355) Resp:  [16-18] 16 (08/21 0355) BP: (111-137)/(59-71) 124/61 mmHg (08/21 0355) SpO2:  [93 %-100 %] 100 % (08/21 0355) Weight:  [66 kg (145 lb 8.1 oz)-67.9 kg (149 lb 11.1 oz)] 67.9 kg (149 lb 11.1 oz) (08/21 0500) Weight change: -1.7 kg (-3 lb 12 oz)  Intake/Output from previous day: 08/20 0701 - 08/21 0700 In: 240 [P.O.:240] Out: 3500  Intake/Output this shift:    General appearance: cooperative, cachectic and pale Resp: diminished breath sounds bilaterally Chest wall: RIJ PC Cardio: S1, S2 normal and systolic murmur: holosystolic 2/6, blowing at apex GI: pos bs, liver down 5 cm, mild distension Extremities: RAKA, L for bandaged to knee, bleeding from heel, LUA AVF  Lab Results:  Recent Labs  12/12/14 0304 12/13/14 0907  WBC 9.7 9.2  HGB 10.4* 9.7*  HCT 32.6* 30.3*  PLT 158 135*   BMET:  Recent Labs  12/12/14 0304 12/13/14 0906  NA 136 133*  K 3.9 4.3  CL 97* 95*  CO2 27 26  GLUCOSE 114* 126*  BUN 21* 40*  CREATININE 2.91* 3.90*  CALCIUM 8.5* 8.5*   No results for input(s): PTH in the last 72 hours. Iron Studies: No results for input(s): IRON, TIBC, TRANSFERRIN, FERRITIN in the last 72 hours.  Studies/Results: No results found.  I have reviewed the patient's current medications.  Assessment/Plan: 1 ESRD did well at HD yest.will cont to lower vol as he seems to have ascites 2 DM controlled 3 Anemia slightly lower,cont ESA 4 PVD  Per [facility] 5 Malnutrit encourrage diet/suppl 6 CAD P HD TTS, ESA, nutrition, to NH per primary    LOS: 6 days   Otie Headlee L 12/14/2014,7:51 AM

## 2014-12-14 NOTE — Progress Notes (Signed)
   VASCULAR SURGERY ASSESSMENT & PLAN:  * Pt is still not agreeable to proceed with amputation on the left.   *  POD 18 s/p revision of Right AKA  SUBJECTIVE: No complaints  PHYSICAL EXAM: Filed Vitals:   12/13/14 1246 12/13/14 2016 12/14/14 0355 12/14/14 0500  BP: 137/62 130/67 124/61   Pulse: 80 71 72   Temp: 98.6 F (37 C) 97.8 F (36.6 C) 98.5 F (36.9 C)   TempSrc: Oral Oral Oral   Resp: Height:      Weight:    149 lb 11.1 oz (67.9 kg)  SpO2: 93% 100% 100%    Some drainage from high right AKA  LABS: Lab Results  Component Value Date   WBC 9.2 12/13/2014   HGB 9.7* 12/13/2014   HCT 30.3* 12/13/2014   MCV 93.2 12/13/2014   PLT 135* 12/13/2014   Lab Results  Component Value Date   CREATININE 3.90* 12/13/2014   Lab Results  Component Value Date   INR 1.28 12/14/2014   CBG (last 3)   Recent Labs  12/13/14 1623 12/13/14 2109 12/14/14 0613  GLUCAP 141* 136* 145*    Active Problems:   Non-healing wound of lower extremity   Cari Caraway Beeper: [phone] 12/14/2014

## 2014-12-15 LAB — PROTIME-INR
INR: 1.35 (ref 0.00–1.49)
PROTHROMBIN TIME: 16.7 s — AB (ref 11.6–15.2)

## 2014-12-15 LAB — GLUCOSE, CAPILLARY
Glucose-Capillary: 138 mg/dL — ABNORMAL HIGH (ref 65–99)
Glucose-Capillary: 123 mg/dL — ABNORMAL HIGH (ref 65–99)
Glucose-Capillary: 148 mg/dL — ABNORMAL HIGH (ref 65–99)

## 2014-12-15 MED ORDER — WARFARIN SODIUM 1 MG PO TABS: 1.0000 mg | ORAL_TABLET | ORAL | Status: DC

## 2014-12-15 MED ORDER — WARFARIN SODIUM 1 MG PO TABS: 0.5000 mg | ORAL_TABLET | Freq: Every day | ORAL | Status: DC

## 2014-12-15 MED ORDER — WARFARIN SODIUM 1 MG PO TABS: 0.5000 mg | ORAL_TABLET | ORAL | Status: DC

## 2014-12-15 MED ORDER — OXYCODONE HCL 10 MG PO TABS: ORAL_TABLET | ORAL | Status: DC

## 2014-12-15 MED ORDER — CEPHALEXIN 500 MG PO CAPS: 500.0000 mg | ORAL_CAPSULE | Freq: Two times a day (BID) | ORAL | Status: DC

## 2014-12-15 MED ORDER — WARFARIN - PHARMACIST DOSING INPATIENT: Freq: Every day | Status: DC

## 2014-12-15 MED FILL — Heparin Sodium (Porcine) 2 Unit/ML in Sodium Chloride 0.9%: INTRAMUSCULAR | Qty: 1000 | Status: AC

## 2014-12-15 NOTE — Progress Notes (Signed)
[facility] Progress Note   Subjective: no complaints, no surg per patient, primary team says they are planning dc today  Filed Vitals:   12/14/14 1403 12/14/14 1957 12/15/14 0400 12/15/14 0439  BP: 120/61 123/61  119/56  Pulse: 72 74  66  Temp: 98.3 F (36.8 C) 98.1 F (36.7 C)  98 F (36.7 C)  TempSrc: Oral Oral  Oral  Resp: 18 18  18   Height:      Weight:   67.8 kg (149 lb 7.6 oz) 67.5 kg (148 lb 13 oz)  SpO2: 100% 100%  100%   Exam: No distress Chest clear RRR no mrg Abd soft +ascites nontender R high AKA healing wound, L leg wrapped LUA AVF +bruit / R IJ cath Neuro alert, Ox 3  TTS AF  4h  67kg  2/2.25 bath  Hep none  LUA AVF (10/10/14 Dr Arbie Cookey) R IJ cath Aranesp 150/ wk, finished Fe load 8/16      Assessment: 1. Left foot wounds/ gangrene - no surg per patient for now, on abx 2. ESRD HD tues 3. Hypotension on midodrine 4. Volume is at dry wt 5. Anemia cont esa 6. HPTH stable 7. Recent R AKA 8. Afib 9. CAD hx cabg, ef 40%  Plan - for dc today , HD tues at OP center    Vinson Moselle MD  pager [phone]    cell [phone]  12/15/2014, 7:50 AM     Recent Labs Lab 12/09/14 0755  12/11/14 0433 12/12/14 0304 12/13/14 0906  NA 132*  < > 136 136 133*  K 4.5  < > 3.6 3.9 4.3  CL 90*  < > 95* 97* 95*  CO2 25  < > 26 27 26   GLUCOSE 126*  < > 154* 114* 126*  BUN 61*  < > 46* 21* 40*  CREATININE 5.27*  < > 4.49* 2.91* 3.90*  CALCIUM 8.5*  < > 8.7* 8.5* 8.5*  PHOS 5.7*  --   --   --  3.7  < > = values in this interval not displayed.  Recent Labs Lab 12/08/14 1338 12/09/14 0755 12/13/14 0906  AST 18  --   --   ALT 17  --   --   ALKPHOS 97  --   --   BILITOT 0.9  --   --   PROT 5.5*  --   --   ALBUMIN 2.1* 2.0* 1.9*    Recent Labs Lab 12/08/14 1338  12/11/14 0433 12/12/14 0304 12/13/14 0907  WBC 15.9*  < > 11.6* 9.7 9.2  NEUTROABS 14.5*  --   --   --   --   HGB 7.9*  < > 8.4* 10.4* 9.7*  HCT 25.2*  < > 27.1* 32.6* 30.3*   MCV 95.8  < > 95.8 93.4 93.2  PLT 151  < > 182 158 135*  < > = values in this interval not displayed. . sodium chloride   Intravenous Once  . sodium chloride   Intravenous Once  . sodium chloride   Intravenous Once  . amiodarone  400 mg Oral BID  . calcium acetate  1,334 mg Oral TID WC  . darbepoetin (ARANESP) injection - DIALYSIS  200 mcg Intravenous Q Tue-HD  . feeding supplement  1 Container Oral TID BM  . ferric gluconate (FERRLECIT/NULECIT) IV  125 mg Intravenous Q Tue-HD  . heparin  5,000 Units Subcutaneous 3 times per day  . insulin aspart  0-9 Units Subcutaneous TID  WC  . midodrine  10 mg Oral Q T,Th,Sa-HD  . multivitamin  1 tablet Oral QHS  . OxyCODONE  15 mg Oral Q12H  . pantoprazole  40 mg Oral Daily  . piperacillin-tazobactam (ZOSYN)  IV  2.25 g Intravenous 3 times per day  . vancomycin  750 mg Intravenous Q T,Th,Sa-HD     guaiFENesin-dextromethorphan, hydrALAZINE, labetalol, metoprolol, morphine injection, nitroGLYCERIN, ondansetron, oxyCODONE, phenol

## 2014-12-15 NOTE — Care Management Important Message (Signed)
Important Message  Patient Details  Name: [name] MRN: [id number] Date of Birth: 1965-11-20   Medicare Important Message Given:  Yes-fourth notification given    Cherylann Parr, RN 12/15/2014, 11:19 AM

## 2014-12-15 NOTE — Discharge Summary (Signed)
[facility] Discharge Summary  [name] 1966-03-06 49 y.o. male  [id number]  Admission Date: 12/08/2014  Discharge Date: 12/15/2014  Physician: Sherren Kerns, MD  Admission Diagnosis: pain pvd with left foot ulcer  HPI:   This is a 49 y.o. male who presented for evaluation of left foot wound on 12/08/14. Pt known to me from prior right above knee amputation and revision a few weeks ago. Now with worsening wound left foot. He has had a chronic pretibial ulcer as well. Pt admits to scraping and scuffing foot frequently while moving around in his wheelchair. He remains severely malnourished and has had difficulty healing wounds secondary to this. Other medical problems include CAD, DM, ESRD T Th Sat dialysis tobacco abuse. He is on coumadin for afib.   Hospital Course:  The patient was admitted to the hospital on 12/08/14 and started on empiric IV antibiotics for leukocytosis. The renal service was consulted for dialysis management. He had pain and swelling of his left thigh and venous duplex was ordered to ruling out DVT. His right AKA had some minor oozing and heparin was held on hospital day 2.  Plans were made for angiogram on 12/10/14 for angiogram with possible intervention. This was cancelled due to elevated INR and rescheduled for 12/11/14. He was started on nutrition supplements.   On 12/11/14, the patient went to the [facility] lab and underwent arteriogram with left leg runoff and possible intervention. There were no percutaneous revascularization options available. The patient tolerated the procedure well and was transported to the recovery room in good condition.   The patient was offered a left AKA, but refused. His blood cultures were negative and leukocytosis resolved on 12/12/14. His left leg wounds were clean. His heparin was restarted on 12/13/14. The patient refused nasoenteric tube for nutrition as his wounds were slow to heal.   By 12/15/14, the  patient continued to refuse left AKA. His right AKA had some erythema around the incision but did not appear infected. He was afebrile without leukocytosis. His blood cultures were negative. Vancomycin and zosyn were discontinued and he was discharged with keflex. He was restarted on coumadin and discharged to SNF in fair condition on 12/15/14.      CBC    Component Value Date/Time   WBC 9.2 12/13/2014 0907   RBC 3.25* 12/13/2014 0907   HGB 9.7* 12/13/2014 0907   HCT 30.3* 12/13/2014 0907   PLT 135* 12/13/2014 0907   MCV 93.2 12/13/2014 0907   MCH 29.8 12/13/2014 0907   MCHC 32.0 12/13/2014 0907   RDW 17.7* 12/13/2014 0907   LYMPHSABS 0.4* 12/08/2014 1338   MONOABS 1.0 12/08/2014 1338   EOSABS 0.0 12/08/2014 1338   BASOSABS 0.0 12/08/2014 1338    BMET    Component Value Date/Time   NA 133* 12/13/2014 0906   K 4.3 12/13/2014 0906   CL 95* 12/13/2014 0906   CO2 26 12/13/2014 0906   GLUCOSE 126* 12/13/2014 0906   BUN 40* 12/13/2014 0906   CREATININE 3.90* 12/13/2014 0906   CALCIUM 8.5* 12/13/2014 0906   GFRNONAA 17* 12/13/2014 0906   GFRAA 19* 12/13/2014 0906     Discharge Instructions:   The patient is discharged to SNF with extensive instructions on wound care and progressive ambulation.  They are instructed not to drive or perform any heavy lifting until returning to see the physician in his office.    Discharge Diagnosis:  pain pvd with left foot ulcer  Secondary Diagnosis: Patient  Active Problem List   Diagnosis Date Noted  . Non-healing wound of lower extremity 12/08/2014  . CKD (chronic kidney disease) stage V requiring chronic dialysis 12/02/2014  . Symptomatic anemia 12/02/2014  . Anemia 12/02/2014  . Non-healing amputation site 11/25/2014  . New onset a-fib 10/22/2014  . ESRD on dialysis 10/22/2014  . Enteritis due to Clostridium difficile 10/22/2014  . Stage 3 skin ulcer of sacral region 10/22/2014  . Protein-calorie malnutrition 10/22/2014  .  Hemodialysis-associated hypotension 10/22/2014  . 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 Pressure ulcer 10/03/2014  . Gangrene of foot   . Sepsis affecting skin   . Protein-calorie malnutrition, severe 09/29/2014  . Chronic systolic CHF (congestive heart failure)   . Diabetes type 2, controlled   . Systolic CHF, chronic   . Metabolic encephalopathy   . Cellulitis of leg, right   . Cellulitis of leg, left   . Chronic systolic heart failure   . Cardiomyopathy   . Hypokalemia   . Blood poisoning   . Bilateral lower leg cellulitis   . Jaundice   . Peripheral artery disease   . CAD in native artery   . Essential hypertension   . HLD (hyperlipidemia)   . Diabetes type 2, uncontrolled   . Acute on chronic renal failure   . Chronic combined systolic and diastolic CHF (congestive heart failure)   . Combined systolic and diastolic congestive heart failure 09/24/2014  . Cellulitis of right leg 09/24/2014  . Sepsis 09/24/2014  . Cellulitis 09/24/2014  . GERD (gastroesophageal reflux disease)   . Gastroesophageal reflux disease with esophagitis   . Type 2 diabetes mellitus with complication   . Acute on chronic combined systolic and diastolic congestive heart failure, NYHA class 4 2014/03/09  . At risk for sudden cardiac death 03/09/2014  . PAD (peripheral artery disease) 11/20/2012  . Diastolic dysfunction, Grade 3 16/01/9603  . Thrombocytopenia-chronic 03/22/2011  . 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 HTN (hypertension) 03/19/2011  . Hyperlipidemia 03/19/2011  . DM2 (diabetes mellitus, type 2) 03/19/2011  . CHF (congestive heart failure): Systolic/ diastolic 03/19/2011  . Acute renal failure superimposed on stage 4 chronic kidney disease 03/19/2011  . Tobacco abuse 03/19/2011  . Marijuana abuse 03/19/2011  . 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 CAD (coronary artery disease) 03/19/2011  . NSTEMI (non-ST elevated myocardial infarction) 03/19/2011   Past Medical History  Diagnosis Date  . Coronary artery disease 07/05/2011    2D ECHO - EF ~40%, moderate concentric LV hypertrophy, LA moderately dilated, mild-moderate septal and inferior wall hypokinesis  . Hypertension   . CHF (congestive heart failure)   . Diabetes mellitus     Insulin dependent  . Cardiomyopathy   . Claudication 05/09/2011    Right and left anterior tibial arteries and Left SFA-occluded; Right CIA-<50% diameter reduction; Right Deep Profunda-70-99% diameter reduction; Right SFA->60% diameter reduction; Right Distal Popliteal/Tibial Artery- >60% diameter reduction; Left CFA and Profunda- >50% diameter reduction  . 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 GERD (gastroesophageal reflux disease)   . Peripheral vascular disease   . Chronic kidney disease (CKD), stage IV (severe)   . S/P AKA (above  knee amputation) 11/26/2014    REVISION OF AMPUTATION       Medication List    ASK your doctor about these medications        amiodarone 400 MG tablet  Commonly known as:  PACERONE  Take 1 tablet (400 mg total) by mouth 2 (two) times daily.     calcium acetate 667 MG capsule  Commonly known as:  PHOSLO  Take 2 capsules (1,334 mg total) by mouth 3 (three) times daily with meals.     Darbepoetin Alfa 100 MCG/0.5ML Sosy injection  Commonly known as:  ARANESP  Inject 0.5 mLs (100 mcg total) into the vein every Tuesday with hemodialysis.     DECUBI-VITE PO  Take 1 tablet by mouth daily.     feeding supplement (NEPRO CARB STEADY) Liqd  Take 237 mLs by mouth 2 (two) times daily between meals.     feeding supplement (PRO-STAT SUGAR FREE 64) Liqd  Take 30 mLs by mouth 2 (two) times  daily.     folic acid 1 MG tablet  Commonly known as:  FOLVITE  Take 1 tablet (1 mg total) by mouth daily.     insulin aspart 100 UNIT/ML injection  Commonly known as:  novoLOG  Inject 0-9 Units into the skin 3 (three) times daily with meals. Before each meal 3 times a day, 140-199 - 2 units, 200-250 - 4 units, 251-299 - 6 units,  300-349 - 8 units,  350 or above 10 units. Insulin PEN if approved, provide syringes and needles if needed.     Iron 325 (65 FE) MG Tabs  Take 325 mg by mouth daily.     Melatonin 3 MG Tabs  Take 3 mg by mouth at bedtime.     midodrine 10 MG tablet  Commonly known as:  PROAMATINE  Take 1 tablet (10 mg total) by mouth Every Tuesday,Thursday,and Saturday with dialysis.     nitroGLYCERIN 0.4 MG SL tablet  Commonly known as:  NITROSTAT  Place 1 tablet (0.4 mg total) under the tongue once.     OxyCODONE 15 mg T12a 12 hr tablet  Commonly known as:  OXYCONTIN  Take one tablet by mouth every 12 hours for pain. Do not crush     Oxycodone HCl 10 MG Tabs  Take one tablet by mouth every 4 hours as needed for breakthrough pain     pantoprazole 40 MG tablet  Commonly known as:  PROTONIX  Take 40 mg by mouth daily.     polyethylene glycol packet  Commonly known as:  MIRALAX / GLYCOLAX  Take 17 g by mouth daily as needed for moderate constipation.     senna-docusate 8.6-50 MG per tablet  Commonly known as:  Senokot-S  Take 1 tablet by mouth 2 (two) times daily.     vancomycin 125 MG capsule  Commonly known as:  VANCOCIN  Take 125 mg by mouth 4 (four) times daily.     warfarin 1 MG tablet  Commonly known as:  COUMADIN  Take 0.5-1 mg by mouth daily at 6 PM. Take 1 mg on Mon,Wed,Fri,Sat,Sun, and 0.5 mg on Tues,Thurs        Oxycodone #30 No Refill  Disposition: SNF  Patient's condition: is Fair  Follow up: 1. Dr. Darrick Penna in 4 weeks   Maris Berger, PA-C [facility] [phone] 12/15/2014  7:49 AM

## 2014-12-15 NOTE — Progress Notes (Signed)
  [facility] Progress Note  Subjective  - Still refusing left AKA. Not ready for it yet.   Objective Filed Vitals:   12/15/14 0439  BP: 119/56  Pulse: 66  Temp: 98 F (36.7 C)  Resp: 18    Intake/Output Summary (Last 24 hours) at 12/15/14 0737 Last data filed at 12/14/14 1739  Gross per 24 hour  Intake    720 ml  Output      0 ml  Net    720 ml   Right AKA with some peri-incisional erythema. No drainage.  Left leg wrapped.    Assessment/Planning: 49 y.o. male with left leg wound. No options for percutaneous revascularization or bypass Still refusing left AKA.  He is ok for discharge back to SNF today. Restart on coumadin today for afib. Blood cultures negative. No leukocystosis. On empiric vanc/zosyn for possible infected left leg wound. Urine culture with multiple species present. Discussed with Dr. Darrick Penna. Will d/c on keflex.  D/c to SNF today.  [name] 12/15/2014 7:37 AM --  Laboratory CBC    Component Value Date/Time   WBC 9.2 12/13/2014 0907   HGB 9.7* 12/13/2014 0907   HCT 30.3* 12/13/2014 0907   PLT 135* 12/13/2014 0907    BMET    Component Value Date/Time   NA 133* 12/13/2014 0906   K 4.3 12/13/2014 0906   CL 95* 12/13/2014 0906   CO2 26 12/13/2014 0906   GLUCOSE 126* 12/13/2014 0906   BUN 40* 12/13/2014 0906   CREATININE 3.90* 12/13/2014 0906   CALCIUM 8.5* 12/13/2014 0906   GFRNONAA 17* 12/13/2014 0906   GFRAA 19* 12/13/2014 0906    COAG Lab Results  Component Value Date   INR 1.35 12/15/2014   INR 1.28 12/14/2014   INR 1.39 12/13/2014   No results found for: PTT  Antibiotics Anti-infectives    Start     Dose/Rate Route Frequency Ordered Stop   12/09/14 1200  vancomycin (VANCOCIN) IVPB 750 mg/150 ml premix     750 mg 150 mL/hr over 60 Minutes Intravenous Every T-Th-Sa (Hemodialysis) 12/08/14 1504     12/08/14 1400  piperacillin-tazobactam (ZOSYN) IVPB 2.25 g     2.25 g 100 mL/hr over 30 Minutes  Intravenous 3 times per day 12/08/14 1336     12/08/14 1345  vancomycin (VANCOCIN) 1,500 mg in sodium chloride 0.9 % 500 mL IVPB     1,500 mg 250 mL/hr over 120 Minutes Intravenous  Once 12/08/14 1336 12/08/14 1754       [name] Berger, PA-C [facility] Office: [phone] Pager: [phone] 12/15/2014 7:37 AM

## 2014-12-15 NOTE — Progress Notes (Signed)
Patient will discharge to [facility] place Anticipated discharge date: 12/15/14 Family notified:pt mom Transportation by [employer]- scheduled for 1:30pm  CSW signing off.  Merlyn Lot, LCSWA Clinical Social Worker [phone]

## 2014-12-15 NOTE — Progress Notes (Signed)
ANTICOAGULATION CONSULT NOTE - Initial Consult  Pharmacy Consult for warfarin Indication: atrial fibrillation  No Known Allergies  Patient Measurements: Height:  (180.3 cm) Weight: 148 lb 13 oz (67.5 kg) IBW/kg (Calculated) : 75.3   Vital Signs: Temp: 98 F (36.7 C) (08/22 0439) Temp Source: Oral (08/22 0439) BP: 119/56 mmHg (08/22 0439) Pulse Rate: 66 (08/22 0439)  Labs:  Recent Labs  12/13/14 0256 12/13/14 0906 12/13/14 0907 12/14/14 0450 12/15/14 0232  HGB  --   --  9.7*  --   --   HCT  --   --  30.3*  --   --   PLT  --   --  135*  --   --   LABPROT 17.1*  --   --  16.2* 16.7*  INR 1.39  --   --  1.28 1.35  CREATININE  --  3.90*  --   --   --     Estimated Creatinine Clearance: 21.6 mL/min (by C-G formula based on Cr of 3.9).   Medical History: Past Medical History  Diagnosis Date  . Coronary artery disease 07/05/2011    2D ECHO - EF ~40%, moderate concentric LV hypertrophy, LA moderately dilated, mild-moderate septal and inferior wall hypokinesis  . Hypertension   . CHF (congestive heart failure)   . Diabetes mellitus     Insulin dependent  . Cardiomyopathy   . Claudication 05/09/2011    Right and left anterior tibial arteries and Left SFA-occluded; Right CIA-<50% diameter reduction; Right Deep Profunda-70-99% diameter reduction; Right SFA->60% diameter reduction; Right Distal Popliteal/Tibial Artery- >60% diameter reduction; Left CFA and Profunda- >50% diameter reduction  . 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 GERD (gastroesophageal reflux disease)   . Peripheral vascular disease   . Chronic kidney disease (CKD), stage IV (severe)   . 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 Warfarin has been held since 8/14 due to possible surgery, however patient is still refusing L AKA. No new CBC today. Hg 9.7, plt 135 (down) on 8/20. No bleed documented. Also on SQ heparin inpatient. 2 units FFP given with HD 8/16. 8/17: negative for DVT but large lymphocele in R groin. Oozing of R AKA on 8/18 - resolved. Vit K 10 mg IV given 08/18 for aortogram.  PTA warfarin dose: 1 mg 5 days a week, 0.5 mg on Tue/Th  INR 1.35 today subtherapeutic.  Goal of Therapy:  INR 2-3 Monitor platelets by anticoagulation protocol: Yes   Plan:  Resume coumadin  daily, except 0.5mg  on Thurs, Sat Ok to keep sq heparin on today until d/c with subtherapeutic INR Daily INR Mon s/sx bleeding  To SNF today   Babs Bertin, PharmD Clinical Pharmacist Pager [phone] 12/15/2014 8:37 AM

## 2014-12-15 NOTE — Progress Notes (Signed)
PT d/c to [facility], alert and oriented at the time of discharged, attempted to call report to the nurse at [facility] no response, will try again

## 2014-12-15 NOTE — Discharge Instructions (Signed)

## 2014-12-17 ENCOUNTER — Non-Acute Institutional Stay (SKILLED_NURSING_FACILITY): Payer: Medicare Other | Admitting: Adult Health

## 2014-12-17 ENCOUNTER — Encounter: Payer: Self-pay | Admitting: Adult Health

## 2014-12-17 DIAGNOSIS — R5381 Other malaise: Secondary | ICD-10-CM | POA: Diagnosis not present

## 2014-12-17 DIAGNOSIS — I480 Paroxysmal atrial fibrillation: Secondary | ICD-10-CM | POA: Diagnosis not present

## 2014-12-17 DIAGNOSIS — G4700 Insomnia, unspecified: Secondary | ICD-10-CM | POA: Diagnosis not present

## 2014-12-17 DIAGNOSIS — I953 Hypotension of hemodialysis: Secondary | ICD-10-CM

## 2014-12-17 DIAGNOSIS — N186 End stage renal disease: Secondary | ICD-10-CM

## 2014-12-17 DIAGNOSIS — I739 Peripheral vascular disease, unspecified: Secondary | ICD-10-CM

## 2014-12-17 DIAGNOSIS — K219 Gastro-esophageal reflux disease without esophagitis: Secondary | ICD-10-CM | POA: Diagnosis not present

## 2014-12-17 DIAGNOSIS — E119 Type 2 diabetes mellitus without complications: Secondary | ICD-10-CM | POA: Diagnosis not present

## 2014-12-17 DIAGNOSIS — N189 Chronic kidney disease, unspecified: Secondary | ICD-10-CM | POA: Diagnosis not present

## 2014-12-17 DIAGNOSIS — E43 Unspecified severe protein-calorie malnutrition: Secondary | ICD-10-CM | POA: Diagnosis not present

## 2014-12-17 DIAGNOSIS — Z7901 Long term (current) use of anticoagulants: Secondary | ICD-10-CM

## 2014-12-17 DIAGNOSIS — D631 Anemia in chronic kidney disease: Secondary | ICD-10-CM

## 2014-12-17 DIAGNOSIS — G894 Chronic pain syndrome: Secondary | ICD-10-CM | POA: Diagnosis not present

## 2014-12-17 DIAGNOSIS — K5900 Constipation, unspecified: Secondary | ICD-10-CM | POA: Diagnosis not present

## 2014-12-17 DIAGNOSIS — Z992 Dependence on renal dialysis: Secondary | ICD-10-CM

## 2014-12-18 ENCOUNTER — Telehealth: Payer: Self-pay | Admitting: Vascular Surgery

## 2014-12-18 NOTE — Telephone Encounter (Signed)
-----   Message from Sharee Pimple, RN sent at 12/15/2014  8:31 AM EDT ----- Schedule ----- Message -----    From: Raymond Gurney, PA-C    Sent: 12/15/2014   7:52 AM      To: [facility] Charge Pool  Patient with left leg wound in need of left AKA but refusing. Follow up prn only.   Has right AKA. Follow up in one month for staple removal. Nurse visit only.   Thanks Selena Batten

## 2014-12-24 ENCOUNTER — Encounter: Payer: Self-pay | Admitting: Vascular Surgery

## 2014-12-25 ENCOUNTER — Encounter: Payer: Self-pay | Admitting: Vascular Surgery

## 2014-12-25 ENCOUNTER — Ambulatory Visit (INDEPENDENT_AMBULATORY_CARE_PROVIDER_SITE_OTHER): Payer: Medicare Other | Admitting: Vascular Surgery

## 2014-12-25 ENCOUNTER — Other Ambulatory Visit: Payer: Self-pay

## 2014-12-25 VITALS — BP 137/7 | HR 84 | Temp 98.5°F | Ht 71.0 in | Wt 148.0 lb

## 2014-12-25 DIAGNOSIS — I70269 Atherosclerosis of native arteries of extremities with gangrene, unspecified extremity: Secondary | ICD-10-CM | POA: Diagnosis not present

## 2014-12-25 NOTE — Progress Notes (Signed)
Patient is a 49 year old male who is status post right above-knee amputation June 2016. This subsequently was revised to a higher above-knee amputation in August 2016 when the lower above-knee amputation failed to heal. The patient has also had a previous left brachiocephalic AV fistula placed by Dr. Arbie Cookey in June 2016. 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 He is currently dialyzing via a catheter. The patient has had significant deterioration of his left foot in the intervening weeks. He underwent an arteriogram recently. He was deemed to be unreconstructable due to his overall debility and poor bypass targets. He was offered an above-knee amputation 2 weeks ago but refused. He returns today for further discussion regarding amputation and to recheck wounds on his previous 2 surgical sites. He has had ongoing problems with chronic severe malnutrition with his most recent serum pre-albumen being 7.2, 2 weeks ago. Discussions were held with the patient about placing a feeding tube at that time and he also refused. His malnutrition was so severe that he required vitamin K and additionally more than 6 units of fresh frozen plasma to reverse his Coumadin despite being off the medication for several days.  Past Surgical History  Procedure Laterality Date  . Cardiac catheterization  05/07/2008    CABG  . Coronary artery bypass graft  06/2008    x3  . Insertion of dialysis catheter Right 10/01/2014    Procedure: INSERTION OF RIGHT INTERNAL JUGULAR DIALYSIS CATHETER;  Surgeon: Larina Earthly, MD;  Location: [facility] OR;  Service: Vascular;  Laterality: Right;  . Amputation Right 10/06/2014    Procedure: AMPUTATION ABOVE KNEE Right;  Surgeon: Sherren Kerns, MD;  Location: [facility] OR;  Service: Vascular;  Laterality: Right;  . Av fistula placement Left 10/10/2014    Procedure: LEFT BRACHIAL TO CEPHALIC ARTERIOVENOUS (AV) FISTULA CREATION;  Surgeon: Larina Earthly,  MD;  Location: [facility] OR;  Service: Vascular;  Laterality: Left;  . Amputation Right 11/26/2014    Procedure: REVISION AMPUTATION ABOVE KNEE;  Surgeon: Sherren Kerns, MD;  Location: [facility] OR;  Service: Vascular;  Laterality: Right;  . Peripheral vascular catheterization N/A 12/12/2014    Procedure: Abdominal Aortogram;  Surgeon: Sherren Kerns, MD;  Location: [facility] INVASIVE CV LAB;  Service: Cardiovascular;  Laterality: N/A;     Past Medical History  Diagnosis Date  . Coronary artery disease 07/05/2011    2D ECHO - EF ~40%, moderate concentric LV hypertrophy, LA moderately dilated, mild-moderate septal and inferior wall hypokinesis  . Hypertension   . CHF (congestive heart failure)   . Diabetes mellitus     Insulin dependent  . Cardiomyopathy   . Claudication 05/09/2011    Right and left anterior tibial arteries and Left SFA-occluded; Right CIA-<50% diameter reduction; Right Deep Profunda-70-99% diameter reduction; Right SFA->60% diameter reduction; Right Distal Popliteal/Tibial Artery- >60% diameter reduction; Left CFA and Profunda- >50% diameter reduction  . 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 GERD (gastroesophageal reflux disease)   . Peripheral vascular disease   . Chronic kidney disease (CKD), stage IV (severe)   . 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 Amino Acids-Protein  Hydrolys (FEEDING SUPPLEMENT, PRO-STAT SUGAR FREE 64,) LIQD Take 30 mLs by mouth 2 (two) times daily. 900 mL 0  . amiodarone (PACERONE) 400 MG tablet Take 1 tablet (400 mg total) by mouth 2 (two) times daily. 60 tablet 0  . calcium acetate (PHOSLO) 667 MG capsule Take 2 capsules (1,334 mg total) by mouth 3  (three) times daily with meals. 90 capsule 0  . Darbepoetin Alfa (ARANESP) 100 MCG/0.5ML SOSY injection Inject 0.5 mLs (100 mcg total) into the vein every Tuesday with hemodialysis. 4.2 mL 0  . Ferrous Sulfate (IRON) 325 (65 FE) MG TABS Take 325 mg by mouth daily.    . folic acid (FOLVITE) 1 MG tablet Take 1 tablet (1 mg total) by mouth daily. 30 tablet 0  . insulin aspart (NOVOLOG) 100 UNIT/ML injection Inject 0-9 Units into the skin 3 (three) times daily with meals. Before each meal 3 times a day, 140-199 - 2 units, 200-250 - 4 units, 251-299 - 6 units,  300-349 - 8 units,  350 or above 10 units. Insulin PEN if approved, provide syringes and needles if needed. (Patient taking differently: Inject 0-10 Units into the skin 4 (four) times daily -  with meals and at bedtime. 140-199 - 2 units, 200-250 - 4 units, 251-299 - 6 units,  300-349 - 8 units,  350 or above 10 units. Insulin PEN if approved, provide syringes and needles if needed) 10 mL 11  . Melatonin 3 MG TABS Take 3 mg by mouth at bedtime.    . midodrine (PROAMATINE) 10 MG tablet Take 1 tablet (10 mg total) by mouth Every Tuesday,Thursday,and Saturday with dialysis. 30 tablet 0  . Multiple Vitamins-Minerals (DECUBI-VITE PO) Take 1 tablet by mouth daily.    . nitroGLYCERIN (NITROSTAT) 0.4 MG SL tablet Place 1 tablet (0.4 mg total) under the tongue once. (Patient taking differently: Place 0.4 mg under the tongue every 5 (five) minutes as needed for chest pain. ) 25 tablet 5  . Nutritional Supplements (FEEDING SUPPLEMENT, NEPRO CARB STEADY,) LIQD Take 237 mLs by mouth 2 (two) times daily between meals. 30 Can 0  . pantoprazole (PROTONIX) 40 MG tablet Take 40 mg by mouth daily.     Marland Kitchen senna-docusate (SENOKOT-S) 8.6-50 MG per tablet Take 1 tablet by mouth 2 (two) times daily. 30 tablet 0  . vancomycin (VANCOCIN) 125 MG capsule Take 125 mg by mouth 4 (four) times daily.    Marland Kitchen warfarin (COUMADIN) 1 MG tablet Take 0.5-1 tablets (0.5-1 mg total) by mouth  daily at 6 PM. Take 1 mg on Mon, Tue,Wed,Fri,Sun, and 0.5 mg on Thurs, Sat 30 tablet 3  . cephALEXin (KEFLEX) 500 MG capsule Take 1 capsule (500 mg total) by mouth 2 (two) times daily. (Patient not taking: Reported on 12/25/2014) 28 capsule 0  . OxyCODONE (OXYCONTIN) 15 mg T12A 12 hr tablet Take one tablet by mouth every 12 hours for pain. Do not crush (Patient not taking: Reported on 12/25/2014) 10 tablet 0  . Oxycodone HCl 10 MG TABS Take one tablet by mouth every 4 hours as needed for breakthrough pain (Patient not taking: Reported on 12/25/2014) 30 tablet 0  . polyethylene glycol (MIRALAX / GLYCOLAX) packet Take 17 g by mouth daily as needed for moderate constipation. (Patient not taking: Reported on 12/25/2014) 14 each 0   No current facility-administered medications on file prior to visit.     Physical exam:  Filed Vitals:   12/25/14 0830 12/25/14 0841  BP: 149/83 137/7  Pulse: 84   Temp: 98.5 F (36.9 C)   TempSrc: Oral   Height: 5\' 11"  (1.803 m)   Weight: 148 lb (67.132 kg)   SpO2: 100%     Right above-knee amputation incision intact staples intact small amount of serous drainage no erythema Left upper extremity: Small sinus tract groin draining serous fluid from antecubital area palpable thrill and upper arm fistula Left lower extremity: Gangrenous right foot with wound extending to just below the knee  Assessment: Gangrene left foot patient not consenting to an above-knee amputation. Patient still has chronic severe malnutrition. He will need continued nutritional supplementation in order to assist in healing his wounds which have been a difficult problem for him. We will stop his Coumadin today. Hopefully his INR will be less than 1.5 by early next week for his above-knee amputation on Tuesday, September 6.  Risks benefits possible complications and procedure details have previously been discussed with the patient. Primary risk is going to be nonhealing wounds. He agrees and  understands.  The patient will be admitted to the hospitalist service due to his multiple medical problems for assistance in management of those. Our primary involvement will be management of his amputation and previous surgical wounds.  Plan: See above  Fabienne Bruns, MD [facility] [place] Office: [phone] Pager: [phone]

## 2014-12-26 ENCOUNTER — Encounter (HOSPITAL_COMMUNITY): Payer: Self-pay | Admitting: *Deleted

## 2014-12-26 NOTE — Progress Notes (Signed)
Spoke with Bonita Quin, nurse for pt at [facility] for pre-op call. She states pt is alert and oriented and can speak for himself. Sisters will be meeting pt here day of surgery. Bonita Quin verified medical history. Was given pre-op instructions according to pre-op checklist. Pt is diabetic, she states CBG's run around 120-130 (?fasting). Daneen Schick for pt not to take his diabetic medications the morning of surgery. She states that they usually give OJ if blood sugar is low, I instructed her to give clear juice instead and to recheck CBG and if still low to call the Short Stay Unit for further instructions. She voiced understanding.

## 2014-12-27 NOTE — Progress Notes (Signed)
Patient ID: [name], male   DOB: 01-21-1966, 49 y.o.   MRN: [id number]    DATE:  12/17/14 MRN:  [id number]  BIRTHDAY: [birth year]  Facility:  Nursing Home Location:  [facility]  Nursing Home Room Number: [id number]  LEVEL OF CARE:  SNF (31)  Contact Information    Name Relation Home Work Mobile   Harper,Deloris Mother [phone]     Price,Sherri Sister   [phone]       Chief Complaint  Patient presents with  . Hospitalization Follow-up    Physical deconditioning, PVD with left foot ulcer, atrial fibrillation, ESRD, anemia, protein calorie malnutrition, diabetes mellitus type 2, insomnia, hypertension, chronic pain, GERD and constipation    HISTORY OF PRESENT ILLNESS:  This is a 49 year old male who has been admitted to [facility] on 12/15/14 from [facility]. He was recently transferred to the hospital for evaluation of left foot wound. He has PMH of CAD, diabetes mellitus, ESRD (TThSat dialysis), tobacco abuse and atrial fibrillation on Coumadin. He had arteriogram with left leg run off on 12/11/14. There were no percutaneous revascularization options available. He was offered left AKA but refused.   He has been admitted for a short-term rehabilitation.  PAST MEDICAL HISTORY:  Past Medical History  Diagnosis Date  . Coronary artery disease 07/05/2011    2D ECHO - EF ~40%, moderate concentric LV hypertrophy, LA moderately dilated, mild-moderate septal and inferior wall hypokinesis  . Hypertension   . CHF (congestive heart failure)   . Diabetes mellitus     Insulin dependent  . Cardiomyopathy   . Claudication 05/09/2011    Right and left anterior tibial arteries and Left SFA-occluded; Right CIA-<50% diameter reduction; Right Deep Profunda-70-99% diameter reduction; Right SFA->60% diameter reduction; Right Distal Popliteal/Tibial Artery- >60% diameter reduction; Left CFA and Profunda- >50% diameter reduction  . S/P CABG (coronary artery bypass graft)  03/23/2011`    STRESS TEST - LV EF 29%, mild reversible within the apical segment of the anterior and anterolateral wall, small infarct in the inferolateral wall, global hypokinesia  . GERD (gastroesophageal reflux disease)   . Peripheral vascular disease   . 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 Chronic kidney disease (CKD), stage IV (severe)     Dialysis - t/th/Sa     CURRENT MEDICATIONS: Reviewed  Patient's Medications  New Prescriptions   No medications on file  Previous Medications   AMINO ACIDS-PROTEIN HYDROLYS (FEEDING SUPPLEMENT, PRO-STAT SUGAR FREE 64,) LIQD    Take 30 mLs by mouth 2 (two) times daily.   AMIODARONE (PACERONE) 400 MG TABLET    Take 1 tablet (400 mg total) by mouth 2 (two) times daily.   CALCIUM ACETATE (PHOSLO) 667 MG CAPSULE    Take 2 capsules (1,334 mg total) by mouth 3 (three) times daily with meals.   DARBEPOETIN ALFA (ARANESP) 100 MCG/0.5ML SOSY INJECTION    Inject 0.5 mLs (100 mcg total) into the vein every Tuesday with hemodialysis.   FERROUS SULFATE (IRON) 325 (65 FE) MG TABS    Take 325 mg by mouth daily.   FOLIC ACID (FOLVITE) 1 MG TABLET    Take 1 tablet (1 mg total) by mouth daily.   INSULIN ASPART (NOVOLOG) 100 UNIT/ML INJECTION    Inject 0-9 Units into the skin 3 (three) times daily with meals. Before each meal 3 times a day, 140-199 - 2 units, 200-250 - 4 units, 251-299 - 6 units,  300-349 -  8 units,  350 or above 10 units. Insulin PEN if approved, provide syringes and needles if needed.   MULTIPLE VITAMINS-MINERALS (DECUBI-VITE PO)    Take 1 tablet by mouth daily.   NITROGLYCERIN (NITROSTAT) 0.4 MG SL TABLET    Place 1 tablet (0.4 mg total) under the tongue once.   NUTRITIONAL SUPPLEMENTS (FEEDING SUPPLEMENT, NEPRO CARB STEADY,) LIQD    Take 237 mLs by mouth 2 (two) times daily between meals.   OXYCODONE (ROXICODONE) 15 MG IMMEDIATE RELEASE TABLET    Take 15 mg by mouth 2 (two) times daily.   OXYCODONE HCL 10 MG  TABS    Take one tablet by mouth every 4 hours as needed for breakthrough pain   POLYETHYLENE GLYCOL (MIRALAX / GLYCOLAX) PACKET    Take 17 g by mouth daily as needed for moderate constipation.   SENNA-DOCUSATE (SENOKOT-S) 8.6-50 MG PER TABLET    Take 1 tablet by mouth 2 (two) times daily.   WARFARIN (COUMADIN) 1 MG TABLET    Take 0.5-1 tablets (0.5-1 mg total) by mouth daily at 6 PM. Take 1 mg on Mon, Tue,Wed,Fri,Sun, and 0.5 mg on Thurs, Sat  Modified Medications   No medications on file  Discontinued Medications   CEPHALEXIN (KEFLEX) 500 MG CAPSULE    Take 1 capsule (500 mg total) by mouth 2 (two) times daily.   MELATONIN 3 MG TABS    Take 3 mg by mouth at bedtime.   MIDODRINE (PROAMATINE) 10 MG TABLET    Take 1 tablet (10 mg total) by mouth Every Tuesday,Thursday,and Saturday with dialysis.   OXYCODONE (OXYCONTIN) 15 MG T12A 12 HR TABLET    Take one tablet by mouth every 12 hours for pain. Do not crush   PANTOPRAZOLE (PROTONIX) 40 MG TABLET    Take 40 mg by mouth daily.    VANCOMYCIN (VANCOCIN) 125 MG CAPSULE    Take 125 mg by mouth 4 (four) times daily.     No Known Allergies   REVIEW OF SYSTEMS:  GENERAL:   no fatigue, no weight changes, no fever, chills or weakness EYES: Denies change in vision, dry eyes, eye pain, itching or discharge EARS: Denies change in hearing, ringing in ears, or earache NOSE: Denies nasal congestion or epistaxis MOUTH and THROAT: Denies oral discomfort, gingival pain or bleeding, pain from teeth or hoarseness   RESPIRATORY: no cough, SOB, DOE, wheezing, hemoptysis CARDIAC: no chest pain, edema or palpitations GI: no abdominal pain, diarrhea, constipation, heart burn, nausea or vomiting GU: Denies dysuria, frequency, hematuria, incontinence, or discharge PSYCHIATRIC: Denies feeling of depression or anxiety. No report of hallucinations, insomnia, paranoia, or agitation   PHYSICAL EXAMINATION  GENERAL APPEARANCE: In no acute distress. SKIN:  Right AKA  stump with dry dressing, sacrum pressure ulcer stage 3, LLE covered with dry dressing, left 2nd digit black/necrotic, BUE covered with dry dressing HEAD: Normal in size and contour. No evidence of trauma EYES: Lids open and close normally. No blepharitis, entropion or ectropion. PERRL. Conjunctivae are clear and sclerae are white. Lenses are without opacity EARS: Pinnae are normal. Patient hears normal voice tunes of the examiner MOUTH and THROAT: Lips are without lesions. Oral mucosa is moist and without lesions. Tongue is normal in shape, size, and color and without lesions NECK: supple, trachea midline, no neck masses, no thyroid tenderness, no thyromegaly LYMPHATICS: no LAN in the neck, no supraclavicular LAN RESPIRATORY: breathing is even & unlabored, BS CTAB CARDIAC: RRR, no extra heart sounds, no edema, right  chest IJ perm cath, LUA AVF + bruit GI: abdomen soft, normal BS, no masses, no tenderness, no hepatomegaly, no splenomegaly EXTREMITIES:  Right AKA  PSYCHIATRIC: Alert and oriented X 3. Affect and behavior are appropriate  LABS/RADIOLOGY: Labs reviewed: Basic Metabolic Panel:  Recent Labs  16/10/96 2301  10/03/14 0235  10/08/14 0901  11/26/14 1612  12/09/14 0755  12/11/14 0433 12/12/14 0304 12/13/14 0906  NA 140  < > 136  < > 132*  < > 137  < > 132*  < > 136 136 133*  K 3.8  < > 3.7  < > 3.7  < > 3.4*  < > 4.5  < > 3.6 3.9 4.3  CL 101  < > 96*  < > 96*  < > 97*  < > 90*  < > 95* 97* 95*  CO2 17*  < > 23  < > 21*  < > 25  < > 25  < > 26 27 26   GLUCOSE 162*  < > 171*  < > 136*  < > 109*  < > 126*  < > 154* 114* 126*  BUN 67*  < > 31*  < > 31*  < > 36*  < > 61*  < > 46* 21* 40*  CREATININE 4.65*  < > 3.68*  < > 3.66*  < > 4.19*  4.25*  < > 5.27*  < > 4.49* 2.91* 3.90*  CALCIUM 8.2*  < > 8.2*  < > 8.3*  < > 8.2*  < > 8.5*  < > 8.7* 8.5* 8.5*  MG 2.2  --  2.0  --  1.8  --   --   --   --   --   --   --   --   PHOS  --   < >  --   < >  --   < > 7.5*  --  5.7*  --   --   --   3.7  < > = values in this interval not displayed. Liver Function Tests:  Recent Labs  10/08/14 0901  10/16/14 0548  12/08/14 1338 12/09/14 0755 12/13/14 0906  AST 19  --  22  --  18  --   --   ALT 13*  --  16*  --  17  --   --   ALKPHOS 95  --  104  --  97  --   --   BILITOT 2.6*  --  1.6*  --  0.9  --   --   PROT 5.7*  --  5.8*  --  5.5*  --   --   ALBUMIN 2.1*  < > 2.2*  2.2*  < > 2.1* 2.0* 1.9*  < > = values in this interval not displayed.  Recent Labs  09/26/14 0100 10/04/14 0335  LIPASE 106* 119*    Recent Labs  09/30/14 0110  AMMONIA 28   CBC:  Recent Labs  10/08/14 0901  12/02/14 1956  12/08/14 1338  12/11/14 0433 12/12/14 0304 12/13/14 0907  WBC 6.5  < > 9.1  < > 15.9*  < > 11.6* 9.7 9.2  NEUTROABS 5.2  --  7.8*  --  14.5*  --   --   --   --   HGB 10.1*  < > 7.0*  < > 7.9*  < > 8.4* 10.4* 9.7*  HCT 30.7*  < > 22.7*  < > 25.2*  < > 27.1* 32.6* 30.3*  MCV 85.3  < > 96.6  < > 95.8  < > 95.8 93.4 93.2  PLT 128*  < > 135*  < > 151  < > 182 158 135*  < > = values in this interval not displayed.  Lipid Panel:  Recent Labs  09/25/14 0410 09/26/14 0345 12/03/14 0535  HDL <10* <10* 40*   Cardiac Enzymes:  Recent Labs  09/30/14 0110 09/30/14 0510 09/30/14 1303  TROPONINI 0.07* 0.10* 0.11*   CBG:  Recent Labs  12/15/14 0559 12/15/14 0656 12/15/14 1151  GLUCAP 138* 123* 148*     Dg Chest 2 View  12/08/2014   CLINICAL DATA:  Preop evaluation.  Sepsis with cellulitis both legs.  EXAM: CHEST  2 VIEW  COMPARISON:  10/17/2014  FINDINGS: Sternotomy wires and right IJ central venous catheter unchanged. Lungs are hypoinflated without focal consolidation or effusion. Mild stable cardiomegaly. Remainder of the exam is unchanged.  IMPRESSION: Hypoinflation without acute cardiopulmonary disease.   Electronically Signed   By: Elberta Fortis M.D.   On: 12/08/2014 14:49    ASSESSMENT/PLAN:  Physical deconditioning - for rehabilitation  PVD with left  foot ulcer - refused left AKA; continue Vancomycin 125 mg PO QID  Atrial fibrillation - rate controlled; continue amiodarone 400 mg by mouth twice a day  Coumadin  ESRD - continue hemodialysis and possible 667 mg 2 capsules = 1334 mg by mouth 3 times a day  Anemia of chronic disease - hemoglobin 9.7; continue Aranesp 100 g/0.5 mL IV every Tuesdays with hemodialysis and iron 325 mg 1 tab by mouth daily  Protein calorie malnutrition, severe - albumin 1.9; RD consult  Diabetes mellitus, type II - hemoglobin A1c 5.5; continue NovoLog sliding scale 3 times a day with meals  Insomnia - continue melatonin re-milligrams 1 tab by mouth daily at bedtime  Hemodialysis associated hypotension - continue midodrine 10 mg 1 tab by mouth every Tuesdays, Thursdays and Saturdays with dialysis  Chronic pain - verbalized that his pain is well-controlled; continue oxycodone 15 mg 12 hour 1 tab by mouth every 12 hours and oxycodone 10 mg 1 tab by mouth every 4 hours when necessary  GERD - continue Protonix 40 mg 1 tab by mouth daily  Constipation - continue MiraLAX 17 g by mouth daily when necessary and senna S1 tab by mouth twice a day  Long-term use of anticoagulant - INR 1.3, subtherapeutic; Coumadin 10 mg by mouth 1 then Coumadin 5 mg by mouth daily; INR check on 12/19/14     Goals of care:  Short-term rehabilitation    [facility], NP [facility] [phone]

## 2014-12-30 ENCOUNTER — Encounter (HOSPITAL_COMMUNITY): Payer: Self-pay | Admitting: Certified Registered Nurse Anesthetist

## 2014-12-30 ENCOUNTER — Inpatient Hospital Stay (HOSPITAL_COMMUNITY)
Admission: RE | Admit: 2014-12-30 | Discharge: 2015-01-03 | DRG: 239 | Disposition: A | Discharge status: Skilled Nursing Facility | Payer: Medicare Other | Source: Ambulatory Visit | Attending: Internal Medicine | Admitting: Internal Medicine

## 2014-12-30 ENCOUNTER — Encounter (HOSPITAL_COMMUNITY): Payer: Self-pay | Admitting: Surgery

## 2014-12-30 ENCOUNTER — Encounter (HOSPITAL_COMMUNITY)
Admission: RE | Disposition: A | Discharge status: Skilled Nursing Facility | Payer: Self-pay | Source: Ambulatory Visit | Attending: Internal Medicine

## 2014-12-30 DIAGNOSIS — D696 Thrombocytopenia, unspecified: Secondary | ICD-10-CM | POA: Diagnosis present

## 2014-12-30 DIAGNOSIS — T45515A Adverse effect of anticoagulants, initial encounter: Secondary | ICD-10-CM | POA: Diagnosis present

## 2014-12-30 DIAGNOSIS — I70262 Atherosclerosis of native arteries of extremities with gangrene, left leg: Secondary | ICD-10-CM | POA: Diagnosis present

## 2014-12-30 DIAGNOSIS — Z794 Long term (current) use of insulin: Secondary | ICD-10-CM

## 2014-12-30 DIAGNOSIS — R791 Abnormal coagulation profile: Secondary | ICD-10-CM | POA: Diagnosis present

## 2014-12-30 DIAGNOSIS — D62 Acute posthemorrhagic anemia: Secondary | ICD-10-CM | POA: Diagnosis not present

## 2014-12-30 DIAGNOSIS — Z951 Presence of aortocoronary bypass graft: Secondary | ICD-10-CM | POA: Diagnosis not present

## 2014-12-30 DIAGNOSIS — I252 Old myocardial infarction: Secondary | ICD-10-CM

## 2014-12-30 DIAGNOSIS — I96 Gangrene, not elsewhere classified: Secondary | ICD-10-CM | POA: Diagnosis present

## 2014-12-30 DIAGNOSIS — I120 Hypertensive chronic kidney disease with stage 5 chronic kidney disease or end stage renal disease: Secondary | ICD-10-CM | POA: Diagnosis present

## 2014-12-30 DIAGNOSIS — D649 Anemia, unspecified: Secondary | ICD-10-CM | POA: Diagnosis present

## 2014-12-30 DIAGNOSIS — Z6820 Body mass index (BMI) 20.0-20.9, adult: Secondary | ICD-10-CM | POA: Diagnosis not present

## 2014-12-30 DIAGNOSIS — Z7901 Long term (current) use of anticoagulants: Secondary | ICD-10-CM

## 2014-12-30 DIAGNOSIS — I5042 Chronic combined systolic (congestive) and diastolic (congestive) heart failure: Secondary | ICD-10-CM | POA: Diagnosis present

## 2014-12-30 DIAGNOSIS — I447 Left bundle-branch block, unspecified: Secondary | ICD-10-CM | POA: Diagnosis present

## 2014-12-30 DIAGNOSIS — L89153 Pressure ulcer of sacral region, stage 3: Secondary | ICD-10-CM | POA: Diagnosis present

## 2014-12-30 DIAGNOSIS — E1152 Type 2 diabetes mellitus with diabetic peripheral angiopathy with gangrene: Secondary | ICD-10-CM | POA: Diagnosis present

## 2014-12-30 DIAGNOSIS — E46 Unspecified protein-calorie malnutrition: Secondary | ICD-10-CM | POA: Diagnosis present

## 2014-12-30 DIAGNOSIS — D638 Anemia in other chronic diseases classified elsewhere: Secondary | ICD-10-CM | POA: Diagnosis present

## 2014-12-30 DIAGNOSIS — I739 Peripheral vascular disease, unspecified: Secondary | ICD-10-CM | POA: Diagnosis not present

## 2014-12-30 DIAGNOSIS — N186 End stage renal disease: Secondary | ICD-10-CM | POA: Diagnosis present

## 2014-12-30 DIAGNOSIS — Z87891 Personal history of nicotine dependence: Secondary | ICD-10-CM | POA: Diagnosis not present

## 2014-12-30 DIAGNOSIS — Z992 Dependence on renal dialysis: Secondary | ICD-10-CM | POA: Diagnosis not present

## 2014-12-30 DIAGNOSIS — Z419 Encounter for procedure for purposes other than remedying health state, unspecified: Secondary | ICD-10-CM

## 2014-12-30 DIAGNOSIS — N2581 Secondary hyperparathyroidism of renal origin: Secondary | ICD-10-CM | POA: Diagnosis present

## 2014-12-30 DIAGNOSIS — E43 Unspecified severe protein-calorie malnutrition: Secondary | ICD-10-CM | POA: Diagnosis present

## 2014-12-30 DIAGNOSIS — E785 Hyperlipidemia, unspecified: Secondary | ICD-10-CM | POA: Diagnosis present

## 2014-12-30 DIAGNOSIS — Z89611 Acquired absence of right leg above knee: Secondary | ICD-10-CM

## 2014-12-30 DIAGNOSIS — L97529 Non-pressure chronic ulcer of other part of left foot with unspecified severity: Secondary | ICD-10-CM | POA: Diagnosis present

## 2014-12-30 DIAGNOSIS — I4891 Unspecified atrial fibrillation: Secondary | ICD-10-CM | POA: Diagnosis present

## 2014-12-30 DIAGNOSIS — L98429 Non-pressure chronic ulcer of back with unspecified severity: Secondary | ICD-10-CM | POA: Diagnosis present

## 2014-12-30 DIAGNOSIS — I2510 Atherosclerotic heart disease of native coronary artery without angina pectoris: Secondary | ICD-10-CM | POA: Diagnosis present

## 2014-12-30 DIAGNOSIS — E119 Type 2 diabetes mellitus without complications: Secondary | ICD-10-CM | POA: Diagnosis not present

## 2014-12-30 DIAGNOSIS — E1122 Type 2 diabetes mellitus with diabetic chronic kidney disease: Secondary | ICD-10-CM | POA: Diagnosis present

## 2014-12-30 DIAGNOSIS — I4581 Long QT syndrome: Secondary | ICD-10-CM | POA: Diagnosis present

## 2014-12-30 DIAGNOSIS — K2100 Gastro-esophageal reflux disease with esophagitis, without bleeding: Secondary | ICD-10-CM | POA: Diagnosis present

## 2014-12-30 DIAGNOSIS — I482 Chronic atrial fibrillation: Secondary | ICD-10-CM | POA: Diagnosis not present

## 2014-12-30 DIAGNOSIS — Z8679 Personal history of other diseases of the circulatory system: Secondary | ICD-10-CM

## 2014-12-30 LAB — CBC
HEMATOCRIT: 31.8 % — AB (ref 39.0–52.0)
Hemoglobin: 9.9 g/dL — ABNORMAL LOW (ref 13.0–17.0)
MCH: 29.6 pg (ref 26.0–34.0)
MCHC: 31.1 g/dL (ref 30.0–36.0)
MCV: 94.9 fL (ref 78.0–100.0)
PLATELETS: 97 10*3/uL — AB (ref 150–400)
RBC: 3.35 MIL/uL — AB (ref 4.22–5.81)
RDW: 18.5 % — ABNORMAL HIGH (ref 11.5–15.5)
WBC: 10.7 10*3/uL — AB (ref 4.0–10.5)

## 2014-12-30 LAB — PREALBUMIN: PREALBUMIN: 8 mg/dL — AB (ref 18–38)

## 2014-12-30 LAB — GLUCOSE, CAPILLARY
GLUCOSE-CAPILLARY: 118 mg/dL — AB (ref 65–99)
Glucose-Capillary: 133 mg/dL — ABNORMAL HIGH (ref 65–99)
Glucose-Capillary: 113 mg/dL — ABNORMAL HIGH (ref 65–99)
Glucose-Capillary: 111 mg/dL — ABNORMAL HIGH (ref 65–99)

## 2014-12-30 LAB — COMPREHENSIVE METABOLIC PANEL
ALBUMIN: 2.1 g/dL — AB (ref 3.5–5.0)
ALT: 27 U/L (ref 17–63)
AST: 34 U/L (ref 15–41)
Alkaline Phosphatase: 137 U/L — ABNORMAL HIGH (ref 38–126)
Anion gap: 12 (ref 5–15)
BUN: 33 mg/dL — AB (ref 6–20)
CHLORIDE: 99 mmol/L — AB (ref 101–111)
CO2: 27 mmol/L (ref 22–32)
CREATININE: 3.29 mg/dL — AB (ref 0.61–1.24)
Calcium: 8.5 mg/dL — ABNORMAL LOW (ref 8.9–10.3)
GFR calc Af Amer: 24 mL/min — ABNORMAL LOW (ref >60)
GFR, EST NON AFRICAN AMERICAN: 20 mL/min — AB (ref >60)
GLUCOSE: 130 mg/dL — AB (ref 65–99)
Potassium: 3.9 mmol/L (ref 3.5–5.1)
Sodium: 138 mmol/L (ref 135–145)
Total Bilirubin: 0.9 mg/dL (ref 0.3–1.2)
Total Protein: 6.1 g/dL — ABNORMAL LOW (ref 6.5–8.1)

## 2014-12-30 LAB — PROTIME-INR
INR: 3.88 — ABNORMAL HIGH (ref 0.00–1.49)
PROTHROMBIN TIME: 37.1 s — AB (ref 11.6–15.2)

## 2014-12-30 LAB — APTT: APTT: 63 s — AB (ref 24–37)

## 2014-12-30 LAB — TYPE AND SCREEN
ABO/RH(D): O POS
ANTIBODY SCREEN: NEGATIVE

## 2014-12-30 SURGERY — AMPUTATION, ABOVE KNEE
Anesthesia: General

## 2014-12-30 MED ORDER — MIDODRINE HCL 5 MG PO TABS
10.0000 mg | ORAL_TABLET | ORAL | Status: DC
  Filled 2014-12-30 (×3): qty 2

## 2014-12-30 MED ORDER — IRON 325 (65 FE) MG PO TABS: 325.0000 mg | ORAL_TABLET | Freq: Every day | ORAL | Status: DC

## 2014-12-30 MED ORDER — OXYCODONE HCL ER 15 MG PO T12A
15.0000 mg | EXTENDED_RELEASE_TABLET | Freq: Two times a day (BID) | ORAL | Status: DC
  Administered 2014-12-30 – 2015-01-02 (×5): 15 mg via ORAL
  Filled 2014-12-30 (×5): qty 1

## 2014-12-30 MED ORDER — INSULIN ASPART 100 UNIT/ML ~~LOC~~ SOLN
0.0000 [IU] | Freq: Three times a day (TID) | SUBCUTANEOUS | Status: DC
  Administered 2015-01-02: 2 [IU] via SUBCUTANEOUS
  Administered 2015-01-02: 5 [IU] via SUBCUTANEOUS

## 2014-12-30 MED ORDER — FENTANYL CITRATE (PF) 100 MCG/2ML IJ SOLN
INTRAMUSCULAR | Status: AC
  Administered 2014-12-30: 50 ug via INTRAVENOUS
  Filled 2014-12-30: qty 2

## 2014-12-30 MED ORDER — FENTANYL CITRATE (PF) 250 MCG/5ML IJ SOLN
INTRAMUSCULAR | Status: AC
  Filled 2014-12-30: qty 5

## 2014-12-30 MED ORDER — ONDANSETRON HCL 4 MG/2ML IJ SOLN
INTRAMUSCULAR | Status: AC
  Filled 2014-12-30: qty 2

## 2014-12-30 MED ORDER — SODIUM CHLORIDE 0.9 % IV SOLN
125.0000 mg | INTRAVENOUS | Status: DC
  Administered 2014-12-31: 125 mg via INTRAVENOUS
  Filled 2014-12-30 (×2): qty 10

## 2014-12-30 MED ORDER — MORPHINE SULFATE (PF) 2 MG/ML IV SOLN
2.0000 mg | INTRAVENOUS | Status: DC | PRN
  Administered 2014-12-30 – 2015-01-01 (×14): 2 mg via INTRAVENOUS
  Filled 2014-12-30 (×14): qty 1

## 2014-12-30 MED ORDER — ENSURE ENLIVE PO LIQD
237.0000 mL | Freq: Three times a day (TID) | ORAL | Status: DC
  Filled 2014-12-30: qty 237

## 2014-12-30 MED ORDER — FERROUS SULFATE 325 (65 FE) MG PO TABS
325.0000 mg | ORAL_TABLET | Freq: Every day | ORAL | Status: DC
  Administered 2015-01-01 – 2015-01-03 (×4): 325 mg via ORAL
  Filled 2014-12-30 (×8): qty 1

## 2014-12-30 MED ORDER — PRO-STAT SUGAR FREE PO LIQD
30.0000 mL | Freq: Two times a day (BID) | ORAL | Status: DC
  Administered 2014-12-30 – 2015-01-01 (×2): 30 mL via ORAL
  Filled 2014-12-30 (×5): qty 30

## 2014-12-30 MED ORDER — FENTANYL CITRATE (PF) 100 MCG/2ML IJ SOLN
100.0000 ug | Freq: Once | INTRAMUSCULAR | Status: AC
  Administered 2014-12-30: 50 ug via INTRAVENOUS

## 2014-12-30 MED ORDER — DEXTROSE 5 % IV SOLN
1.5000 g | INTRAVENOUS | Status: DC
  Filled 2014-12-30 (×2): qty 1.5

## 2014-12-30 MED ORDER — NEPRO/CARBSTEADY PO LIQD
237.0000 mL | Freq: Two times a day (BID) | ORAL | Status: DC
  Administered 2015-01-01 (×2): 237 mL via ORAL
  Filled 2014-12-30 (×6): qty 237

## 2014-12-30 MED ORDER — FAMOTIDINE IN NACL 20-0.9 MG/50ML-% IV SOLN
20.0000 mg | INTRAVENOUS | Status: DC
  Administered 2014-12-30 – 2014-12-31 (×2): 20 mg via INTRAVENOUS
  Filled 2014-12-30 (×7): qty 50

## 2014-12-30 MED ORDER — CALCIUM ACETATE (PHOS BINDER) 667 MG PO CAPS: 1334.0000 mg | ORAL_CAPSULE | Freq: Three times a day (TID) | ORAL | Status: DC

## 2014-12-30 MED ORDER — INSULIN ASPART 100 UNIT/ML ~~LOC~~ SOLN: 0.0000 [IU] | Freq: Every day | SUBCUTANEOUS | Status: DC

## 2014-12-30 MED ORDER — FOLIC ACID 1 MG PO TABS
1.0000 mg | ORAL_TABLET | Freq: Every day | ORAL | Status: DC
  Administered 2015-01-01 – 2015-01-03 (×3): 1 mg via ORAL
  Filled 2014-12-30 (×7): qty 1

## 2014-12-30 MED ORDER — SODIUM CHLORIDE 0.9 % IV SOLN
INTRAVENOUS | Status: DC
  Administered 2014-12-30 – 2014-12-31 (×2): via INTRAVENOUS

## 2014-12-30 MED ORDER — SODIUM CHLORIDE 0.9 % IV SOLN
Freq: Once | INTRAVENOUS | Status: AC
  Administered 2014-12-30: 13:00:00 via INTRAVENOUS

## 2014-12-30 MED ORDER — AMIODARONE HCL 200 MG PO TABS
400.0000 mg | ORAL_TABLET | Freq: Every day | ORAL | Status: DC
  Administered 2015-01-01 – 2015-01-02 (×2): 400 mg via ORAL
  Filled 2014-12-30 (×7): qty 2

## 2014-12-30 MED ORDER — DARBEPOETIN ALFA 100 MCG/0.5ML IJ SOSY: 100.0000 ug | PREFILLED_SYRINGE | INTRAMUSCULAR | Status: DC

## 2014-12-30 MED ORDER — OXYCODONE HCL 5 MG PO TABS: 15.0000 mg | ORAL_TABLET | Freq: Two times a day (BID) | ORAL | Status: DC

## 2014-12-30 MED ORDER — DARBEPOETIN ALFA 200 MCG/0.4ML IJ SOSY
200.0000 ug | PREFILLED_SYRINGE | INTRAMUSCULAR | Status: DC
  Administered 2014-12-31: 200 ug via INTRAVENOUS
  Filled 2014-12-30: qty 0.4

## 2014-12-30 MED ORDER — CHLORHEXIDINE GLUCONATE CLOTH 2 % EX PADS: 6.0000 | MEDICATED_PAD | Freq: Once | CUTANEOUS | Status: DC

## 2014-12-30 MED ORDER — RENA-VITE PO TABS
1.0000 | ORAL_TABLET | Freq: Every day | ORAL | Status: DC
  Administered 2014-12-30 – 2015-01-02 (×4): 1 via ORAL
  Filled 2014-12-30 (×7): qty 1

## 2014-12-30 MED ORDER — DEXTROSE 5 % IV SOLN
1.5000 g | INTRAVENOUS | Status: AC
  Administered 2014-12-31: 1.5 g via INTRAVENOUS
  Filled 2014-12-30: qty 1.5

## 2014-12-30 MED ORDER — VITAMIN K1 10 MG/ML IJ SOLN
5.0000 mg | Freq: Once | INTRAVENOUS | Status: DC
  Filled 2014-12-30: qty 0.5

## 2014-12-30 MED ORDER — MUPIROCIN 2 % EX OINT
TOPICAL_OINTMENT | CUTANEOUS | Status: AC
  Filled 2014-12-30: qty 22

## 2014-12-30 MED ORDER — MIDAZOLAM HCL 2 MG/2ML IJ SOLN
INTRAMUSCULAR | Status: AC
  Filled 2014-12-30: qty 4

## 2014-12-30 MED ORDER — LIDOCAINE HCL (CARDIAC) 20 MG/ML IV SOLN
INTRAVENOUS | Status: AC
  Filled 2014-12-30: qty 5

## 2014-12-30 NOTE — Progress Notes (Signed)
Changed dressing to patients wounds.  Left foot:  Black toes, cold to touch, no sensation; open wound extending from heel to mid calf with moderate amount of serosanguinous drainage and some purulent drainage- very malodorous.  Redressed with abd pads, gauze and kerlix.  Left elbow wound from surgery to put in dialysis graft draining sanguinous fluid- dressed with gauze and kerlix.  Right arm has multiple skin tears from wrist to elbow.  Applied petroleum dressing, gauze, and kerlix.  Left shoulder has skin tear- applied petroleum dressing and tegaderm.  Dani Gobble, RN

## 2014-12-30 NOTE — Progress Notes (Signed)
Pt reports received HD yesterday in anticipation of OR today- c/o significant LLE pain. At baseline takes oxycodone 10-15 mg PO q 4 hours so will give 1x order Fentanyl 100 mcg IV  Junious Silk, ANP

## 2014-12-30 NOTE — Progress Notes (Signed)
Report called to [facility] 2 [facility]. Preparing to transport.

## 2014-12-30 NOTE — Progress Notes (Signed)
Patient transferred to 2 [facility] room 28. Patient belongings (clothes and wheelchair) brought by patients girlfriend Psychologist, occupational) to room. Patient voiced complaint of pain and rated it as "9" out of 10. Nurse informed patient that he was ordered to have 100 mcg of Fentanyl. Patient refused to take 100 mcg of Fentanyl and stated "is that Morphine?.....I don't know what that is.Marland KitchenMarland KitchenI don't want that much." Nurse only have patient 50 mcg of Fentanyl. Alphonzo Lemmings, RN informed of this.

## 2014-12-30 NOTE — Consult Note (Signed)
Pt was scheduled for AKA today but INR noted to be 3.8 despite stopping coumadin 4 days ago.  Will order 4 units FFP now and give vitamin K.  Will need INR < 1.7 prior to proceeding.  Pt to be admitted to hospitalist service through prior arrangements.  Hopefully AKA tomorrow  NPO p midnight tonight  Fabienne Bruns, MD [facility] [place] Office: [phone] Pager: [phone]

## 2014-12-30 NOTE — H&P (Signed)
[facility] History and Physical                                                                                    [name], is a 49 y.o. male  MRN: [id number]   DOB - Nov 10, 1965  Admit Date - 12/30/2014  Outpatient Primary MD for the patient is [name], NP  Referring MD: Darrick Penna / [facility]  With History of -  Past Medical History  Diagnosis Date  . Coronary artery disease 07/05/2011    2D ECHO - EF ~40%, moderate concentric LV hypertrophy, LA moderately dilated, mild-moderate septal and inferior wall hypokinesis  . Hypertension   . CHF (congestive heart failure)   . Diabetes mellitus     Insulin dependent  . Cardiomyopathy   . Claudication 05/09/2011    Right and left anterior tibial arteries and Left SFA-occluded; Right CIA-<50% diameter reduction; Right Deep Profunda-70-99% diameter reduction; Right SFA->60% diameter reduction; Right Distal Popliteal/Tibial Artery- >60% diameter reduction; Left CFA and Profunda- >50% diameter reduction  . 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 GERD (gastroesophageal reflux disease)   . Peripheral vascular disease   . 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 Chronic kidney disease (CKD), stage IV (severe)     Dialysis - t/th/Sa      Past Surgical History  Procedure Laterality Date  . Cardiac catheterization  05/07/2008    CABG  . Coronary artery bypass graft  06/2008    x3  . Insertion of dialysis catheter Right 10/01/2014    Procedure: INSERTION OF RIGHT INTERNAL JUGULAR DIALYSIS CATHETER;  Surgeon: Larina Earthly, MD;  Location: [facility] OR;  Service: Vascular;  Laterality: Right;  . Amputation Right 10/06/2014    Procedure: AMPUTATION ABOVE KNEE Right;  Surgeon: Sherren Kerns, MD;  Location: [facility] OR;  Service: Vascular;   Laterality: Right;  . Av fistula placement Left 10/10/2014    Procedure: LEFT BRACHIAL TO CEPHALIC ARTERIOVENOUS (AV) FISTULA CREATION;  Surgeon: Larina Earthly, MD;  Location: [facility] OR;  Service: Vascular;  Laterality: Left;  . Amputation Right 11/26/2014    Procedure: REVISION AMPUTATION ABOVE KNEE;  Surgeon: Sherren Kerns, MD;  Location: [facility] OR;  Service: Vascular;  Laterality: Right;  . Peripheral vascular catheterization N/A 12/12/2014    Procedure: Abdominal Aortogram;  Surgeon: Sherren Kerns, MD;  Location: [facility] INVASIVE CV LAB;  Service: Cardiovascular;  Laterality: N/A;    in for gangrenous left foot in need of AKA    HPI This is a 49 year old male patient with history of peripheral vascular disease status post prior right AKA June 2016 due to nonhealing ulcers, chronic gangrenous left foot recently hospitalized and subsequently discharged on 8/22. During that hospitalization he underwent arteriogram that revealed no percutaneous revascularization options available. Patient refused left AKA and was discharged to a skilled nursing facility. He was seen post discharge at the vascular surgery office on 9/1 and he agreed  to proceed with elective above-the-knee amputation on the left. In addition patient has a history of atrial fibrillation on Coumadin and on several occasions that has had issues with coagulopathy requiring reversal agents. 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  Patient presented to [facility] today with plans to proceed with elective AKA as described above. Unfortunately INR 3.8 despite being off Coumadin for 4 days. [facility] has ordered 4 units of FFP as well as vitamin K and plans are  to proceed with surgical intervention once INR less than or equal to 1.7. We have been asked to admit this patient with stable chronic medical problems to the hospitalist service based on "prior arrangements".  In talking with the patient his primary complaints are of back pain that he relates to the stretcher in the holding area. He's also complaining of pain in the left lower extremity. He also is complaining of difficulty breathing secondary to the position he is laying in on the stretcher noting patient has a very large abdominal girth. Patient reports he underwent hemodialysis on Monday in anticipation of surgery for today (Tuesday) which is his normal dialysis day.   Review of Systems   In addition to the HPI above,  No Fever-chills, myalgias or other constitutional symptoms No Headache, changes with Vision or hearing, new weakness, tingling, numbness in any extremity, No problems swallowing food or Liquids, indigestion/reflux No Chest pain, Cough or new Shortness of Breath; NO palpitations, orthopnea or DOE No Abdominal pain, N/V; no melena or hematochezia, no dark tarry stools, Bowel movements are regular, No dysuria, hematuria or flank pain No new skin rashes, lesions, masses or bruises, No new joints pains-aches No recent weight gain or loss No polyuria, polydypsia or polyphagia,  *A full 10 point Review of Systems was done, except as stated above, all other Review of Systems were negative.  Social History Social History  Substance Use Topics  . Smoking status: Former Smoker -- 0.50 packs/day for 25 years    Types: Cigarettes    Quit date: 09/03/2013  . Smokeless tobacco: Never Used  . Alcohol Use: Yes     Comment: occas.    Resides at: [facility] skilled nursing facility  Lives with: N/A  Ambulatory status: Nonambulatory and requires wheelchair and 2+ assist   Family History Family History  Problem Relation Age of Onset  . Heart disease Mother   . Hypertension  Mother   . Diabetes Mother   . Arthritis Sister   . Diabetes Sister      Prior to Admission medications   Medication Sig Start Date End Date Taking? Authorizing Provider  Amino Acids-Protein Hydrolys (FEEDING SUPPLEMENT, PRO-STAT SUGAR FREE 64,) LIQD Take 30 mLs by mouth 2 (two) times daily. 10/17/14  Yes Albertine Grates, MD  amiodarone (PACERONE) 400 MG tablet Take 1 tablet (400 mg total) by mouth 2 (two) times daily. 10/17/14  Yes Albertine Grates, MD  calcium acetate (PHOSLO) 667 MG capsule Take 2 capsules (1,334 mg total) by mouth 3 (three) times daily with meals. 10/17/14  Yes Albertine Grates, MD  Darbepoetin Alfa (ARANESP) 100 MCG/0.5ML SOSY injection Inject 0.5 mLs (100 mcg total) into the vein every Tuesday with hemodialysis. 10/17/14  Yes Albertine Grates, MD  Ferrous Sulfate (IRON) 325 (65 FE) MG TABS Take 325  mg by mouth daily.   Yes Historical Provider, MD  folic acid (FOLVITE) 1 MG tablet Take 1 tablet (1 mg total) by mouth daily. 10/17/14  Yes Albertine Grates, MD  insulin aspart (NOVOLOG) 100 UNIT/ML injection Inject 0-9 Units into the skin 3 (three) times daily with meals. Before each meal 3 times a day, 140-199 - 2 units, 200-250 - 4 units, 251-299 - 6 units,  300-349 - 8 units,  350 or above 10 units. Insulin PEN if approved, provide syringes and needles if needed. Patient taking differently: Inject 0-10 Units into the skin 4 (four) times daily -  with meals and at bedtime. 140-199 - 2 units, 200-250 - 4 units, 251-299 - 6 units,  300-349 - 8 units,  350 or above 10 units. Insulin PEN if approved, provide syringes and needles if needed 10/18/14  Yes Albertine Grates, MD  Multiple Vitamins-Minerals (DECUBI-VITE PO) Take 1 tablet by mouth daily.   Yes Historical Provider, MD  nitroGLYCERIN (NITROSTAT) 0.4 MG SL tablet Place 1 tablet (0.4 mg total) under the tongue once. Patient taking differently: Place 0.4 mg under the tongue every 5 (five) minutes as needed for chest pain.  10/01/13  Yes Mihai Croitoru, MD  Nutritional Supplements  (FEEDING SUPPLEMENT, NEPRO CARB STEADY,) LIQD Take 237 mLs by mouth 2 (two) times daily between meals. 10/17/14  Yes Albertine Grates, MD  oxyCODONE (ROXICODONE) 15 MG immediate release tablet Take 15 mg by mouth 2 (two) times daily.   Yes Historical Provider, MD  Oxycodone HCl 10 MG TABS Take one tablet by mouth every 4 hours as needed for breakthrough pain 12/15/14  Yes Raymond Gurney, PA-C  polyethylene glycol (MIRALAX / GLYCOLAX) packet Take 17 g by mouth daily as needed for moderate constipation. 10/17/14  Yes Albertine Grates, MD  senna-docusate (SENOKOT-S) 8.6-50 MG per tablet Take 1 tablet by mouth 2 (two) times daily. Patient taking differently: Take 1 tablet by mouth at bedtime as needed for mild constipation.  10/17/14  Yes Albertine Grates, MD  warfarin (COUMADIN) 1 MG tablet Take 0.5-1 tablets (0.5-1 mg total) by mouth daily at 6 PM. Take 1 mg on Mon, Tue,Wed,Fri,Sun, and 0.5 mg on Thurs, Sat Patient not taking: Reported on 12/26/2014 12/15/14   Raymond Gurney, PA-C    No Known Allergies  Physical Exam  Vitals  Blood pressure 115/60, pulse 74, temperature 98 F (36.7 C), temperature source Oral, resp. rate 18, SpO2 100 %.   General:  In no acute distress, appears chronically ill and malnourished  Psych:  Normal somewhat anxious affect, Denies Suicidal or Homicidal ideations, Awake Alert, Oriented X 3. Speech and thought patterns are clear and appropriate, no apparent short term memory deficits  Neuro:   No focal neurological deficits, CN II through XII intact, Strength 5/5 all 4 extremities, Sensation intact all 4 extremities.  ENT:  Ears and Eyes appear Normal, Conjunctivae clear, PER. Moist oral mucosa without erythema or exudates.  Neck:  Supple, No lymphadenopathy appreciated  Respiratory:  Symmetrical chest wall movement, Good air movement bilaterally, CTAB. Room Air  Cardiac:  RRR, No Murmurs, no LE edema noted, no JVD, No carotid bruits, peripheral pulses palpable at 2+  Abdomen:  Positive  bowel sounds, Soft, Non tender, markedly distended consistent with ascites,  No masses appreciated, no obvious hepatosplenomegaly  Skin:  No Cyanosis, Normal Skin Turgor, No Skin Rash or Bruise.  Extremities: Prior right AKA; left lower extremity with edema and cellulitic changes below the knee, has dressing  circling the foot extending up to the mid calf with purulent malodorous drainage can color and appearance at the foot  Data Review  CBC  Recent Labs Lab 12/30/14 1220  WBC 10.7*  HGB 9.9*  HCT 31.8*  PLT 97*  MCV 94.9  MCH 29.6  MCHC 31.1  RDW 18.5*    Chemistries   Recent Labs Lab 12/30/14 1220  NA 138  K 3.9  CL 99*  CO2 27  GLUCOSE 130*  BUN 33*  CREATININE 3.29*  CALCIUM 8.5*  AST 34  ALT 27  ALKPHOS 137*  BILITOT 0.9    estimated creatinine clearance is 25.5 mL/min (by C-G formula based on Cr of 3.29).  No results for input(s): TSH, T4TOTAL, T3FREE, THYROIDAB in the last 72 hours.  Invalid input(s): FREET3  Coagulation profile  Recent Labs Lab 12/30/14 1220  INR 3.88*    No results for input(s): DDIMER in the last 72 hours.  Cardiac Enzymes No results for input(s): CKMB, TROPONINI, MYOGLOBIN in the last 168 hours.  Invalid input(s): CK  Invalid input(s): POCBNP  Urinalysis    Component Value Date/Time   COLORURINE YELLOW 12/08/2014 1914   APPEARANCEUR CLEAR 12/08/2014 1914   LABSPEC 1.014 12/08/2014 1914   PHURINE 7.5 12/08/2014 1914   GLUCOSEU 100* 12/08/2014 1914   HGBUR NEGATIVE 12/08/2014 1914   BILIRUBINUR NEGATIVE 12/08/2014 1914   KETONESUR NEGATIVE 12/08/2014 1914   PROTEINUR >300* 12/08/2014 1914   UROBILINOGEN 0.2 12/08/2014 1914   NITRITE NEGATIVE 12/08/2014 1914   LEUKOCYTESUR TRACE* 12/08/2014 1914    Imaging results:   Dg Chest 2 View  12/08/2014   CLINICAL DATA:  Preop evaluation.  Sepsis with cellulitis both legs.  EXAM: CHEST  2 VIEW  COMPARISON:  10/17/2014  FINDINGS: Sternotomy wires and right IJ central  venous catheter unchanged. Lungs are hypoinflated without focal consolidation or effusion. Mild stable cardiomegaly. Remainder of the exam is unchanged.  IMPRESSION: Hypoinflation without acute cardiopulmonary disease.   Electronically Signed   By: Elberta Fortis M.D.   On: 12/08/2014 14:49     EKG: (Independently reviewed) A did 12/09/2014: Read as sinus rhythm with short PR interval but has inconsistent P-R intervals as well as inconsistent P-wave morphology does rhythm most likely represents underlying atrial fibrillation with either IVCD secondary to underlying known dilated ischemic cardiomyopathy or left bundle branch block, QTC also prolonged at 528 ms which is a chronic finding noting previous EKG June 2016 with QTC 544 ms  Assessment & Plan  Principal Problem:   Gangrene of foot/LEFT/Peripheral artery disease -Admit to telemetry -Management per vascular team -Anticipate surgical intervention with AKA on 9/7 as long as INR less than or equal to 1.7 -Having significant pain so we'll resume preadmission oral pain medications; IV medications have been previously ordered by the surgical team preoperatively -1x dose Fentanyl 100 mcg ordered in OR holding area (see progress notes)  Active Problems:   CKD stage V requiring chronic dialysis -Nephrology aware of admission -Did undergo dialysis yesterday in anticipation of surgery -On clinical exam does not appear to be volume overloaded and is maintaining saturations of 100% on room air    Supratherapeutic INR/ Atrial fibrillation on Coumadin -Reversal agents of FFP and vitamin K being administered by surgical team -Follow-up lab and additional vitamin K and/or FFP if indicated -Coumadin on hold -CHADVASC = 4 -Rate controlled -Continue amiodarone -Suspect difficulty in maintaining INR more reflective of patient's poor nutritional status ie hypoalbunemia causing higher serum levels of warfarin  CAD/LBBB -No reports of chest pain or  other concerning ischemic-type symptomatology    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 -On amiodarone as recommended by EP -d/wAndrew in pharmacy- rec maintenance dos efor VT suppression is 400 mg daily so have decreased dose    Thrombocytopenia-chronic/ Anemia -Hemoglobin stable and at baseline -Platelets slightly low at 97,000 but may be reflective of underlying supratherapeutic INR/coagulopathy -No signs of obvious active bleeding -Repeat CBC in a.m. -cont Aranesp and iron    Chronic combined systolic and diastolic CHF  -Currently compensated and managed with dialysis    Diabetes type 2, controlled -Serum Glucose 130 today  -Check CBGs and provide SSI -ck [id number]    Gastroesophageal reflux disease with esophagitis -Add Pepcid    HLD  -Not on statins    Stage 3 skin ulcer of sacral region -Did not examine at time of admission due to patient reported discomfort during exam -Has Gelfoam mattress at nursing home -Air mattress while here    Protein-calorie malnutrition -Albumin 2.1 -Continue protein supplementation -Nutritional consultation -Suspect distended abdomen secondary to third spacing of fluid and associated ascites related to low albumin and not a cirrhotic process -Abdominal ultrasound 10/05/2014 that demonstrated normal parenchymal echogenicity/ appearance of liver -LFTs are normal except for mildly elevated alkaline phosphatase -Pre-albumin 8.0 today     DVT Prophylaxis: SCDs to left leg if can tolerate  Family Communication:  Sister at bedside   Code Status:  Full code  Condition:  Stable  Discharge disposition:  Anticipate return back to skilled nursing facility after recovers from surgical procedure and if otherwise medically stable  Time spent in minutes : 60      ELLIS,ALLISON L. ANP on 12/30/2014 at 2:03 PM  Between 7am to 7pm - Pager - [phone]  After 7pm go to  [email or web address] - password [email or web address]  And look for the night coverage person covering me after hours  [facility]

## 2014-12-30 NOTE — Progress Notes (Signed)
Dr Darrick Penna gave the OK to use diateck catheter for IV access and lab draw.

## 2014-12-30 NOTE — Consult Note (Signed)
[facility] Renal Consultation Note    Indication for Consultation:  Management of ESRD/hemodialysis; anemia, hypertension/volume and secondary hyperparathyroidism PCP: [facility]  HPI: [name] is a 49 y.o. 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  He presented for planned amputation of left foot due to PAD/gangrene by Dr. Darrick Penna. INR found to be elevated at 3.88; had been 5.44 9/1 and was being managed by his nursing home.  He had HD Monday in anticipation of surgery today. Net UF was 2 L and he attained his EDW of 67 kg Post HD BP was 124/69. P 79. He has severe left leg/foot pain, no N,V, D or SOB. He has been eating well, depending on the food at the NH, much of it he says is pretty good. Reports chronic clear drainage from AVF  Past Medical History  Diagnosis Date  . Coronary artery disease 07/05/2011    2D ECHO - EF ~40%, moderate concentric LV hypertrophy, LA moderately dilated, mild-moderate septal and inferior wall hypokinesis  . Hypertension   . CHF (congestive heart failure)   . Diabetes mellitus     Insulin dependent  . Cardiomyopathy   . Claudication 05/09/2011    Right and left anterior tibial arteries and Left SFA-occluded; Right CIA-<50% diameter reduction; Right Deep Profunda-70-99% diameter reduction; Right SFA->60% diameter reduction; Right Distal Popliteal/Tibial Artery- >60% diameter reduction; Left CFA and Profunda- >50% diameter reduction  . 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 GERD (gastroesophageal reflux disease)   . Peripheral vascular disease   . 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 Chronic kidney disease (CKD), stage IV (severe)     Dialysis - t/th/Sa   Past Surgical History  Procedure Laterality Date  . Cardiac catheterization  05/07/2008    CABG  . Coronary artery bypass graft  06/2008    x3  . Insertion of dialysis catheter Right 10/01/2014    Procedure: INSERTION OF RIGHT INTERNAL JUGULAR DIALYSIS CATHETER;  Surgeon: Larina Earthly, MD;  Location: [facility] OR;  Service: Vascular;  Laterality: Right;  . Amputation Right 10/06/2014    Procedure: AMPUTATION ABOVE KNEE Right;  Surgeon: Sherren Kerns, MD;  Location: [facility] OR;  Service: Vascular;  Laterality: Right;  . Av fistula placement Left 10/10/2014    Procedure: LEFT BRACHIAL TO CEPHALIC ARTERIOVENOUS (AV) FISTULA CREATION;  Surgeon: Larina Earthly, MD;  Location: [facility] OR;  Service: Vascular;  Laterality: Left;  . Amputation Right 11/26/2014    Procedure: REVISION AMPUTATION ABOVE KNEE;  Surgeon: Sherren Kerns, MD;  Location: [facility] OR;  Service: Vascular;  Laterality: Right;  . Peripheral vascular catheterization N/A 12/12/2014    Procedure: Abdominal Aortogram;  Surgeon: Sherren Kerns, MD;  Location: [facility] INVASIVE CV LAB;  Service: Cardiovascular;  Laterality: N/A;   Family History  Problem Relation Age of Onset  . Heart disease Mother   . Hypertension Mother   . Diabetes Mother   . Arthritis Sister   . Diabetes Sister    Social History:  reports that he quit smoking about 15 months ago. His smoking use included Cigarettes. He  has a 12.5 pack-year smoking history. He has never used smokeless tobacco. He reports that he drinks alcohol. He reports that he uses illicit drugs (Marijuana). No Known Allergies Prior to Admission medications   Medication Sig Start Date End Date Taking? Authorizing Provider  Amino Acids-Protein Hydrolys (FEEDING SUPPLEMENT, PRO-STAT SUGAR FREE 64,) LIQD Take 30 mLs by mouth 2 (two) times daily. 10/17/14  Yes Albertine Grates, MD  amiodarone (PACERONE) 400 MG tablet Take 1 tablet  (400 mg total) by mouth 2 (two) times daily. 10/17/14  Yes Albertine Grates, MD  calcium acetate (PHOSLO) 667 MG capsule Take 2 capsules (1,334 mg total) by mouth 3 (three) times daily with meals. 10/17/14  Yes Albertine Grates, MD  Darbepoetin Alfa (ARANESP) 100 MCG/0.5ML SOSY injection Inject 0.5 mLs (100 mcg total) into the vein every Tuesday with hemodialysis. 10/17/14  Yes Albertine Grates, MD  Ferrous Sulfate (IRON) 325 (65 FE) MG TABS Take 325 mg by mouth daily.   Yes Historical Provider, MD  folic acid (FOLVITE) 1 MG tablet Take 1 tablet (1 mg total) by mouth daily. 10/17/14  Yes Albertine Grates, MD  insulin aspart (NOVOLOG) 100 UNIT/ML injection Inject 0-9 Units into the skin 3 (three) times daily with meals. Before each meal 3 times a day, 140-199 - 2 units, 200-250 - 4 units, 251-299 - 6 units,  300-349 - 8 units,  350 or above 10 units. Insulin PEN if approved, provide syringes and needles if needed. Patient taking differently: Inject 0-10 Units into the skin 4 (four) times daily -  with meals and at bedtime. 140-199 - 2 units, 200-250 - 4 units, 251-299 - 6 units,  300-349 - 8 units,  350 or above 10 units. Insulin PEN if approved, provide syringes and needles if needed 10/18/14  Yes Albertine Grates, MD  Multiple Vitamins-Minerals (DECUBI-VITE PO) Take 1 tablet by mouth daily.   Yes Historical Provider, MD  nitroGLYCERIN (NITROSTAT) 0.4 MG SL tablet Place 1 tablet (0.4 mg total) under the tongue once. Patient taking differently: Place 0.4 mg under the tongue every 5 (five) minutes as needed for chest pain.  10/01/13  Yes Mihai Croitoru, MD  Nutritional Supplements (FEEDING SUPPLEMENT, NEPRO CARB STEADY,) LIQD Take 237 mLs by mouth 2 (two) times daily between meals. 10/17/14  Yes Albertine Grates, MD  oxyCODONE (ROXICODONE) 15 MG immediate release tablet Take 15 mg by mouth 2 (two) times daily.   Yes Historical Provider, MD  Oxycodone HCl 10 MG TABS Take one tablet by mouth every 4 hours as needed for breakthrough pain 12/15/14  Yes Raymond Gurney,  PA-C  polyethylene glycol (MIRALAX / GLYCOLAX) packet Take 17 g by mouth daily as needed for moderate constipation. 10/17/14  Yes Albertine Grates, MD  senna-docusate (SENOKOT-S) 8.6-50 MG per tablet Take 1 tablet by mouth 2 (two) times daily. Patient taking differently: Take 1 tablet by mouth at bedtime as needed for mild constipation.  10/17/14  Yes Albertine Grates, MD  warfarin (COUMADIN) 1 MG tablet Take 0.5-1 tablets (0.5-1 mg total) by mouth daily at 6 PM. Take 1 mg on Mon, Tue,Wed,Fri,Sun, and 0.5 mg on Thurs, Sat Patient not taking: Reported on 12/26/2014 12/15/14   Raymond Gurney, PA-C   Current Facility-Administered Medications  Medication Dose Route Frequency Provider Last Rate Last Dose  . 0.9 %  sodium chloride infusion   Intravenous Continuous Sherren Kerns, MD 20 mL/hr at 12/30/14 1226    . 0.9 %  sodium chloride infusion   Intravenous Once  Sherren Kerns, MD      . cefUROXime (ZINACEF) 1.5 g in dextrose 5 % 50 mL IVPB  1.5 g Intravenous 30 min Pre-Op Sherren Kerns, MD      . Melene Muller ON 12/31/2014] cefUROXime (ZINACEF) 1.5 g in dextrose 5 % 50 mL IVPB  1.5 g Intravenous 30 min Pre-Op Raymond Gurney, PA-C      . famotidine (PEPCID) IVPB 20 mg premix  20 mg Intravenous Q12H Russella Dar, NP      . feeding supplement (ENSURE ENLIVE) (ENSURE ENLIVE) liquid 237 mL  237 mL Oral TID WC Sherren Kerns, MD      . fentaNYL (SUBLIMAZE) 100 MCG/2ML injection           . fentaNYL (SUBLIMAZE) injection 100 mcg  100 mcg Intravenous Once Russella Dar, NP      . insulin aspart (novoLOG) injection 0-15 Units  0-15 Units Subcutaneous TID WC Russella Dar, NP      . insulin aspart (novoLOG) injection 0-5 Units  0-5 Units Subcutaneous QHS Russella Dar, NP      . morphine 2 MG/ML injection 2 mg  2 mg Intravenous Q1H PRN Sherren Kerns, MD      . phytonadione (VITAMIN K) 5 mg in dextrose 5 % 50 mL IVPB  5 mg Intravenous Once Sherren Kerns, MD       Labs: Basic Metabolic Panel:  Recent Labs Lab  12/30/14 1220  NA 138  K 3.9  CL 99*  CO2 27  GLUCOSE 130*  BUN 33*  CREATININE 3.29*  CALCIUM 8.5*   Liver Function Tests:  Recent Labs Lab 12/30/14 1220  AST 34  ALT 27  ALKPHOS 137*  BILITOT 0.9  PROT 6.1*  ALBUMIN 2.1*   CBC:  Recent Labs Lab 12/30/14 1220  WBC 10.7*  HGB 9.9*  HCT 31.8*  MCV 94.9  PLT 97*   Lab Results  Component Value Date   INR 3.88* 12/30/2014   INR 1.35 12/15/2014   INR 1.28 12/14/2014    CBG:  Recent Labs Lab 12/30/14 1247 12/30/14 1444  GLUCAP 133* 113*   ROS: As per HPI otherwise negative. Physical Exam: Filed Vitals:   12/30/14 1150  BP: 115/60  Pulse: 74  Temp: 98 F (36.7 C)  TempSrc: Oral  Resp: 18  SpO2: 100%     General: emaciated WM looks older than chronological age, in no acute distress. Head: Normocephalic, atraumatic, sclera non-icteric, mucus membranes are moist Neck: Supple. JVD not elevated. Lungs: Clear bilaterally to auscultation without wheezes, rales, or rhonchi. Breathing is unlabored on room air. Heart: RRR with S1 S2. No murmurs, rubs, or gallops appreciated. Abdomen: Soft, non-tender + ascites, + BS  M-S:  Marked muscle wasting and atrophy Lower extremities: right AKA healing distal serosanguinous drainage + edema; LLE wrapped; toes black, drainage on gauze, + thigh edema, foul odor Neuro: Alert and oriented X 3.  Skin:  Multiple tattoos with petechiae noted on arms Psych:  Responds to questions appropriately with a normal affect. Dialysis Access:left lower AVF (oozing yellow drainage on AVF dressing- this has been chronic) and right IJ  Dialysis Orders: Center: [facility] TTS 4 hr 180 EDW 67 400/800 2 K 2.25 Ca left lower AVF *6.17.16)and right IJ NO heparin Aranesp 200 8/30 venofer 100 through 9/20 no Hectorol Recent labs:  Hgb 9.9 INR 5.44 9/1 - managed by NH  ocrr Ca 10.2 P 2.8 (had been in 5s)  iPTH 2013 never on vit D  Assessment/Plan: 1.  Left foot gangrene with severe PAD - AKA  procedure rescheduled for Wed due to high INR today 2.  ESRD -  TTS had HD Monday in anticipation of surgery today which was cancelled due to ^ INR of 3.88 - need hoyer weights. Plan HD Wed am for 3 hours and then probably a short treatment Thursday pm to get back on schedule. He has been on no heparin HD 3.  Hypertension/volume  - sats ok, volume ok; no indication for HD today;  Can wait until Wednesday and we will do very early for 3 hours - his surgery is scheduled for 1045.Wednesday; ordered midodrine for pre HD -tomorrow - will need to change to tts when back on schedule- I think he needs EDW lowered with serial HD 4.  Anemia  - Hgb 10.8 stable - continue ESA and course of venofer 5.  Metabolic bone disease -  Ca/P ok, no vit D phoslo 6. PCMalnutrition - renal carb mod/vits, supplements 7. afib - chronic coumadin, INR high - getting FFP/vit K 8. Thrombocytopenia - platelets 97 - follow  Sheffield Slider, PA-C [facility] Beeper [phone] 12/30/2014, 3:09 PM   Pt seen, examined, agree w assess/plan as above with additions as indicated.  Vinson Moselle MD pager [phone]    cell [phone] 12/30/2014, 5:33 PM

## 2014-12-30 NOTE — Progress Notes (Signed)
Vitamin K 5 mg ordered and started at 1343, verified by second nurse, Ginette Pitman.  Dialysis catheter accessed by aseptic technique, 10 cc of blood as waste, good blood returned.  Normal saline added to line.

## 2014-12-31 ENCOUNTER — Inpatient Hospital Stay (HOSPITAL_COMMUNITY): Payer: Medicare Other | Admitting: Anesthesiology

## 2014-12-31 ENCOUNTER — Encounter (HOSPITAL_COMMUNITY)
Admission: RE | Disposition: A | Discharge status: Skilled Nursing Facility | Payer: Self-pay | Source: Ambulatory Visit | Attending: Internal Medicine

## 2014-12-31 ENCOUNTER — Encounter (HOSPITAL_COMMUNITY): Payer: Self-pay | Admitting: Anesthesiology

## 2014-12-31 DIAGNOSIS — I96 Gangrene, not elsewhere classified: Secondary | ICD-10-CM

## 2014-12-31 DIAGNOSIS — E46 Unspecified protein-calorie malnutrition: Secondary | ICD-10-CM

## 2014-12-31 DIAGNOSIS — I482 Chronic atrial fibrillation: Secondary | ICD-10-CM

## 2014-12-31 DIAGNOSIS — N183 Chronic kidney disease, stage 3 (moderate): Secondary | ICD-10-CM

## 2014-12-31 DIAGNOSIS — E119 Type 2 diabetes mellitus without complications: Secondary | ICD-10-CM

## 2014-12-31 DIAGNOSIS — L89153 Pressure ulcer of sacral region, stage 3: Secondary | ICD-10-CM

## 2014-12-31 DIAGNOSIS — E785 Hyperlipidemia, unspecified: Secondary | ICD-10-CM

## 2014-12-31 DIAGNOSIS — D696 Thrombocytopenia, unspecified: Secondary | ICD-10-CM

## 2014-12-31 DIAGNOSIS — I5042 Chronic combined systolic (congestive) and diastolic (congestive) heart failure: Secondary | ICD-10-CM

## 2014-12-31 DIAGNOSIS — D631 Anemia in chronic kidney disease: Secondary | ICD-10-CM

## 2014-12-31 HISTORY — PX: AMPUTATION: SHX166

## 2014-12-31 LAB — CBC
HCT: 27.7 % — ABNORMAL LOW (ref 39.0–52.0)
HCT: 33.3 % — ABNORMAL LOW (ref 39.0–52.0)
HEMATOCRIT: 29.1 % — AB (ref 39.0–52.0)
HEMOGLOBIN: 8.9 g/dL — AB (ref 13.0–17.0)
HEMOGLOBIN: 10.1 g/dL — AB (ref 13.0–17.0)
Hemoglobin: 8.5 g/dL — ABNORMAL LOW (ref 13.0–17.0)
MCH: 29 pg (ref 26.0–34.0)
MCH: 29 pg (ref 26.0–34.0)
MCH: 29.1 pg (ref 26.0–34.0)
MCHC: 30.6 g/dL (ref 30.0–36.0)
MCHC: 30.7 g/dL (ref 30.0–36.0)
MCHC: 30.3 g/dL (ref 30.0–36.0)
MCV: 94.8 fL (ref 78.0–100.0)
MCV: 94.5 fL (ref 78.0–100.0)
MCV: 96 fL (ref 78.0–100.0)
PLATELETS: 100 10*3/uL — AB (ref 150–400)
PLATELETS: 117 10*3/uL — AB (ref 150–400)
Platelets: 103 10*3/uL — ABNORMAL LOW (ref 150–400)
RBC: 3.07 MIL/uL — AB (ref 4.22–5.81)
RBC: 2.93 MIL/uL — ABNORMAL LOW (ref 4.22–5.81)
RBC: 3.47 MIL/uL — AB (ref 4.22–5.81)
RDW: 18.5 % — AB (ref 11.5–15.5)
RDW: 18.4 % — ABNORMAL HIGH (ref 11.5–15.5)
RDW: 18.5 % — ABNORMAL HIGH (ref 11.5–15.5)
WBC: 8.6 10*3/uL (ref 4.0–10.5)
WBC: 8.9 10*3/uL (ref 4.0–10.5)
WBC: 10.1 10*3/uL (ref 4.0–10.5)

## 2014-12-31 LAB — RENAL FUNCTION PANEL
ANION GAP: 14 (ref 5–15)
Albumin: 2.4 g/dL — ABNORMAL LOW (ref 3.5–5.0)
BUN: 23 mg/dL — ABNORMAL HIGH (ref 6–20)
CHLORIDE: 99 mmol/L — AB (ref 101–111)
CO2: 25 mmol/L (ref 22–32)
Calcium: 8.5 mg/dL — ABNORMAL LOW (ref 8.9–10.3)
Creatinine, Ser: 2.66 mg/dL — ABNORMAL HIGH (ref 0.61–1.24)
GFR calc Af Amer: 31 mL/min — ABNORMAL LOW (ref >60)
GFR calc non Af Amer: 26 mL/min — ABNORMAL LOW (ref >60)
GLUCOSE: 116 mg/dL — AB (ref 65–99)
PHOSPHORUS: 3.9 mg/dL (ref 2.5–4.6)
POTASSIUM: 4 mmol/L (ref 3.5–5.1)
Sodium: 138 mmol/L (ref 135–145)

## 2014-12-31 LAB — GLUCOSE, CAPILLARY
GLUCOSE-CAPILLARY: 106 mg/dL — AB (ref 65–99)
Glucose-Capillary: 92 mg/dL (ref 65–99)
Glucose-Capillary: 91 mg/dL (ref 65–99)
Glucose-Capillary: 105 mg/dL — ABNORMAL HIGH (ref 65–99)
Glucose-Capillary: 110 mg/dL — ABNORMAL HIGH (ref 65–99)

## 2014-12-31 LAB — APTT: aPTT: 50 seconds — ABNORMAL HIGH (ref 24–37)

## 2014-12-31 LAB — COMPREHENSIVE METABOLIC PANEL
ALT: 28 U/L (ref 17–63)
ALT: 27 U/L (ref 17–63)
AST: 25 U/L (ref 15–41)
AST: 23 U/L (ref 15–41)
Albumin: 2.3 g/dL — ABNORMAL LOW (ref 3.5–5.0)
Albumin: 2.3 g/dL — ABNORMAL LOW (ref 3.5–5.0)
Alkaline Phosphatase: 120 U/L (ref 38–126)
Alkaline Phosphatase: 122 U/L (ref 38–126)
Anion gap: 12 (ref 5–15)
Anion gap: 14 (ref 5–15)
BUN: 39 mg/dL — ABNORMAL HIGH (ref 6–20)
BUN: 41 mg/dL — ABNORMAL HIGH (ref 6–20)
CO2: 29 mmol/L (ref 22–32)
CO2: 27 mmol/L (ref 22–32)
Calcium: 8.3 mg/dL — ABNORMAL LOW (ref 8.9–10.3)
Calcium: 8.3 mg/dL — ABNORMAL LOW (ref 8.9–10.3)
Chloride: 95 mmol/L — ABNORMAL LOW (ref 101–111)
Chloride: 96 mmol/L — ABNORMAL LOW (ref 101–111)
Creatinine, Ser: 3.77 mg/dL — ABNORMAL HIGH (ref 0.61–1.24)
Creatinine, Ser: 3.83 mg/dL — ABNORMAL HIGH (ref 0.61–1.24)
GFR calc Af Amer: 20 mL/min — ABNORMAL LOW (ref >60)
GFR calc Af Amer: 20 mL/min — ABNORMAL LOW (ref >60)
GFR calc non Af Amer: 17 mL/min — ABNORMAL LOW (ref >60)
GFR calc non Af Amer: 17 mL/min — ABNORMAL LOW (ref >60)
Glucose, Bld: 125 mg/dL — ABNORMAL HIGH (ref 65–99)
Glucose, Bld: 120 mg/dL — ABNORMAL HIGH (ref 65–99)
Potassium: 3.8 mmol/L (ref 3.5–5.1)
Potassium: 3.7 mmol/L (ref 3.5–5.1)
Sodium: 136 mmol/L (ref 135–145)
Sodium: 137 mmol/L (ref 135–145)
Total Bilirubin: 0.8 mg/dL (ref 0.3–1.2)
Total Bilirubin: 0.8 mg/dL (ref 0.3–1.2)
Total Protein: 5.7 g/dL — ABNORMAL LOW (ref 6.5–8.1)
Total Protein: 5.8 g/dL — ABNORMAL LOW (ref 6.5–8.1)

## 2014-12-31 LAB — CREATININE, SERUM
Creatinine, Ser: 2.74 mg/dL — ABNORMAL HIGH (ref 0.61–1.24)
GFR calc non Af Amer: 25 mL/min — ABNORMAL LOW (ref >60)
GFR, EST AFRICAN AMERICAN: 29 mL/min — AB (ref >60)

## 2014-12-31 LAB — PHOSPHORUS
Phosphorus: 5.2 mg/dL — ABNORMAL HIGH (ref 2.5–4.6)
Phosphorus: 5.2 mg/dL — ABNORMAL HIGH (ref 2.5–4.6)

## 2014-12-31 LAB — PROTIME-INR
INR: 1.41 (ref 0.00–1.49)
PROTHROMBIN TIME: 17.4 s — AB (ref 11.6–15.2)

## 2014-12-31 SURGERY — AMPUTATION, ABOVE KNEE
Anesthesia: General | Site: Leg Upper | Laterality: Left

## 2014-12-31 MED ORDER — FENTANYL CITRATE (PF) 100 MCG/2ML IJ SOLN
INTRAMUSCULAR | Status: DC | PRN
  Administered 2014-12-31 (×2): 50 ug via INTRAVENOUS
  Administered 2014-12-31 (×4): 25 ug via INTRAVENOUS

## 2014-12-31 MED ORDER — FENTANYL CITRATE (PF) 250 MCG/5ML IJ SOLN
INTRAMUSCULAR | Status: AC
  Filled 2014-12-31: qty 5

## 2014-12-31 MED ORDER — ALTEPLASE 2 MG IJ SOLR
2.0000 mg | Freq: Once | INTRAMUSCULAR | Status: DC | PRN
  Filled 2014-12-31: qty 2

## 2014-12-31 MED ORDER — ALTEPLASE 2 MG IJ SOLR
2.0000 mg | Freq: Once | INTRAMUSCULAR | Status: DC | PRN
Start: 2014-12-31 — End: 2014-12-31
  Filled 2014-12-31: qty 2

## 2014-12-31 MED ORDER — PROPOFOL 10 MG/ML IV BOLUS
INTRAVENOUS | Status: AC
  Filled 2014-12-31: qty 20

## 2014-12-31 MED ORDER — SODIUM CHLORIDE 0.9 % IV SOLN: 100.0000 mL | INTRAVENOUS | Status: DC | PRN

## 2014-12-31 MED ORDER — BISACODYL 10 MG RE SUPP: 10.0000 mg | Freq: Every day | RECTAL | Status: DC | PRN

## 2014-12-31 MED ORDER — ACETAMINOPHEN 325 MG PO TABS: 325.0000 mg | ORAL_TABLET | ORAL | Status: DC | PRN

## 2014-12-31 MED ORDER — LIDOCAINE HCL (PF) 1 % IJ SOLN: 5.0000 mL | INTRAMUSCULAR | Status: DC | PRN

## 2014-12-31 MED ORDER — HEPARIN SODIUM (PORCINE) 1000 UNIT/ML DIALYSIS: 1000.0000 [IU] | INTRAMUSCULAR | Status: DC | PRN

## 2014-12-31 MED ORDER — LIDOCAINE-PRILOCAINE 2.5-2.5 % EX CREA
1.0000 "application " | TOPICAL_CREAM | CUTANEOUS | Status: DC | PRN
  Filled 2014-12-31: qty 5

## 2014-12-31 MED ORDER — MIDAZOLAM HCL 2 MG/2ML IJ SOLN
INTRAMUSCULAR | Status: AC
  Filled 2014-12-31: qty 4

## 2014-12-31 MED ORDER — LABETALOL HCL 5 MG/ML IV SOLN: 10.0000 mg | INTRAVENOUS | Status: DC | PRN

## 2014-12-31 MED ORDER — MIDAZOLAM HCL 2 MG/2ML IJ SOLN: 0.5000 mg | Freq: Once | INTRAMUSCULAR | Status: DC | PRN

## 2014-12-31 MED ORDER — DARBEPOETIN ALFA 200 MCG/0.4ML IJ SOSY
PREFILLED_SYRINGE | INTRAMUSCULAR | Status: AC
  Administered 2014-12-31: 200 ug via INTRAVENOUS
  Filled 2014-12-31: qty 0.4

## 2014-12-31 MED ORDER — ONDANSETRON HCL 4 MG/2ML IJ SOLN: 4.0000 mg | Freq: Four times a day (QID) | INTRAMUSCULAR | Status: DC | PRN

## 2014-12-31 MED ORDER — ONDANSETRON HCL 4 MG/2ML IJ SOLN
INTRAMUSCULAR | Status: AC
  Filled 2014-12-31: qty 2

## 2014-12-31 MED ORDER — SUCCINYLCHOLINE CHLORIDE 20 MG/ML IJ SOLN
INTRAMUSCULAR | Status: AC
  Filled 2014-12-31: qty 1

## 2014-12-31 MED ORDER — METOPROLOL TARTRATE 1 MG/ML IV SOLN: 2.0000 mg | INTRAVENOUS | Status: DC | PRN

## 2014-12-31 MED ORDER — PROMETHAZINE HCL 25 MG/ML IJ SOLN: 6.2500 mg | INTRAMUSCULAR | Status: DC | PRN

## 2014-12-31 MED ORDER — PHENOL 1.4 % MT LIQD: 1.0000 | OROMUCOSAL | Status: DC | PRN

## 2014-12-31 MED ORDER — PANTOPRAZOLE SODIUM 40 MG PO TBEC
40.0000 mg | DELAYED_RELEASE_TABLET | Freq: Every day | ORAL | Status: DC
  Administered 2014-12-31 – 2015-01-03 (×4): 40 mg via ORAL
  Filled 2014-12-31 (×2): qty 1

## 2014-12-31 MED ORDER — HYDRALAZINE HCL 20 MG/ML IJ SOLN: 5.0000 mg | INTRAMUSCULAR | Status: DC | PRN

## 2014-12-31 MED ORDER — MIDAZOLAM HCL 5 MG/5ML IJ SOLN
INTRAMUSCULAR | Status: DC | PRN
  Administered 2014-12-31: 0.5 mg via INTRAVENOUS
  Administered 2014-12-31: 1 mg via INTRAVENOUS

## 2014-12-31 MED ORDER — ONDANSETRON HCL 4 MG/2ML IJ SOLN
INTRAMUSCULAR | Status: DC | PRN
  Administered 2014-12-31: 4 mg via INTRAVENOUS

## 2014-12-31 MED ORDER — MEPERIDINE HCL 25 MG/ML IJ SOLN: 6.2500 mg | INTRAMUSCULAR | Status: DC | PRN

## 2014-12-31 MED ORDER — PENTAFLUOROPROP-TETRAFLUOROETH EX AERO: 1.0000 "application " | INHALATION_SPRAY | CUTANEOUS | Status: DC | PRN

## 2014-12-31 MED ORDER — ACETAMINOPHEN 325 MG RE SUPP: 325.0000 mg | RECTAL | Status: DC | PRN

## 2014-12-31 MED ORDER — HYDROMORPHONE HCL 1 MG/ML IJ SOLN
INTRAMUSCULAR | Status: AC
  Filled 2014-12-31: qty 1

## 2014-12-31 MED ORDER — HYDROMORPHONE HCL 1 MG/ML IJ SOLN
0.2500 mg | INTRAMUSCULAR | Status: DC | PRN
  Administered 2014-12-31 (×2): 0.5 mg via INTRAVENOUS

## 2014-12-31 MED ORDER — DEXTROSE 5 % IV SOLN
1.5000 g | Freq: Two times a day (BID) | INTRAVENOUS | Status: AC
  Administered 2014-12-31 – 2015-01-01 (×2): 1.5 g via INTRAVENOUS
  Filled 2014-12-31 (×2): qty 1.5

## 2014-12-31 MED ORDER — GUAIFENESIN-DM 100-10 MG/5ML PO SYRP: 15.0000 mL | ORAL_SOLUTION | ORAL | Status: DC | PRN

## 2014-12-31 MED ORDER — 0.9 % SODIUM CHLORIDE (POUR BTL) OPTIME
TOPICAL | Status: DC | PRN
  Administered 2014-12-31: 1000 mL

## 2014-12-31 MED ORDER — ETOMIDATE 2 MG/ML IV SOLN
INTRAVENOUS | Status: DC | PRN
  Administered 2014-12-31: 16 mg via INTRAVENOUS

## 2014-12-31 MED ORDER — DOCUSATE SODIUM 100 MG PO CAPS
100.0000 mg | ORAL_CAPSULE | Freq: Every day | ORAL | Status: DC
  Administered 2015-01-01 – 2015-01-03 (×3): 100 mg via ORAL
  Filled 2014-12-31 (×6): qty 1

## 2014-12-31 MED ORDER — DEXTROSE 5 % IV SOLN
INTRAVENOUS | Status: DC | PRN
  Administered 2014-12-31: 13:00:00 via INTRAVENOUS

## 2014-12-31 MED ORDER — ETOMIDATE 2 MG/ML IV SOLN
INTRAVENOUS | Status: AC
  Filled 2014-12-31: qty 10

## 2014-12-31 SURGICAL SUPPLY — 46 items
BANDAGE ELASTIC 4 VELCRO ST LF (GAUZE/BANDAGES/DRESSINGS) ×3 IMPLANT
BANDAGE ELASTIC 6 VELCRO ST LF (GAUZE/BANDAGES/DRESSINGS) ×3 IMPLANT
BANDAGE ESMARK 6X9 LF (GAUZE/BANDAGES/DRESSINGS) IMPLANT
BNDG CMPR 9X6 STRL LF SNTH (GAUZE/BANDAGES/DRESSINGS)
BNDG ESMARK 6X9 LF (GAUZE/BANDAGES/DRESSINGS)
BNDG GAUZE ELAST 4 BULKY (GAUZE/BANDAGES/DRESSINGS) ×4 IMPLANT
CANISTER SUCTION 2500CC (MISCELLANEOUS) ×3 IMPLANT
CLIP LIGATING EXTRA MED SLVR (CLIP) ×3 IMPLANT
CLIP LIGATING EXTRA SM BLUE (MISCELLANEOUS) ×3 IMPLANT
COVER SURGICAL LIGHT HANDLE (MISCELLANEOUS) ×6 IMPLANT
CUFF TOURNIQUET SINGLE 34IN LL (TOURNIQUET CUFF) IMPLANT
CUFF TOURNIQUET SINGLE 44IN (TOURNIQUET CUFF) IMPLANT
DRAIN SNY 10X20 3/4 PERF (WOUND CARE) IMPLANT
DRAPE ORTHO SPLIT 77X108 STRL (DRAPES) ×6
DRAPE PROXIMA HALF (DRAPES) ×3 IMPLANT
DRAPE SURG ORHT 6 SPLT 77X108 (DRAPES) ×2 IMPLANT
ELECT REM PT RETURN 9FT ADLT (ELECTROSURGICAL) ×3
ELECTRODE REM PT RTRN 9FT ADLT (ELECTROSURGICAL) ×1 IMPLANT
EVACUATOR SILICONE 100CC (DRAIN) IMPLANT
GAUZE SPONGE 4X4 12PLY STRL (GAUZE/BANDAGES/DRESSINGS) ×6 IMPLANT
GAUZE XEROFORM 5X9 LF (GAUZE/BANDAGES/DRESSINGS) ×3 IMPLANT
GLOVE BIO SURGEON STRL SZ 6.5 (GLOVE) ×2 IMPLANT
GLOVE BIO SURGEONS STRL SZ 6.5 (GLOVE) ×2
GLOVE BIOGEL PI IND STRL 6.5 (GLOVE) IMPLANT
GLOVE BIOGEL PI INDICATOR 6.5 (GLOVE) ×2
GLOVE SS BIOGEL STRL SZ 7.5 (GLOVE) ×1 IMPLANT
GLOVE SUPERSENSE BIOGEL SZ 7.5 (GLOVE) ×2
GOWN STRL REUS W/ TWL LRG LVL3 (GOWN DISPOSABLE) ×3 IMPLANT
GOWN STRL REUS W/TWL LRG LVL3 (GOWN DISPOSABLE) ×9
KIT BASIN OR (CUSTOM PROCEDURE TRAY) ×3 IMPLANT
KIT ROOM TURNOVER OR (KITS) ×3 IMPLANT
NS IRRIG 1000ML POUR BTL (IV SOLUTION) ×3 IMPLANT
PACK GENERAL/GYN (CUSTOM PROCEDURE TRAY) ×3 IMPLANT
PAD ARMBOARD 7.5X6 YLW CONV (MISCELLANEOUS) ×6 IMPLANT
PAD ELECT DEFIB RADIOL ZOLL (MISCELLANEOUS) ×2 IMPLANT
PADDING CAST COTTON 6X4 STRL (CAST SUPPLIES) IMPLANT
SAW GIGLI STERILE 20 (MISCELLANEOUS) ×3 IMPLANT
SPONGE GAUZE 4X4 12PLY STER LF (GAUZE/BANDAGES/DRESSINGS) ×2 IMPLANT
STAPLER VISISTAT 35W (STAPLE) ×3 IMPLANT
STOCKINETTE IMPERVIOUS LG (DRAPES) ×3 IMPLANT
SUT ETHILON 3 0 PS 1 (SUTURE) IMPLANT
SUT VIC AB 0 CT1 18XCR BRD 8 (SUTURE) ×2 IMPLANT
SUT VIC AB 0 CT1 8-18 (SUTURE) ×6
SUT VICRYL AB 2 0 TIES (SUTURE) ×3 IMPLANT
UNDERPAD 30X30 INCONTINENT (UNDERPADS AND DIAPERS) ×5 IMPLANT
WATER STERILE IRR 1000ML POUR (IV SOLUTION) ×3 IMPLANT

## 2014-12-31 NOTE — Progress Notes (Signed)
[facility] PROGRESS NOTE  [name] WUJ:[id number] DOB: [birth year] DOA: 12/30/2014 PCP: [name], NP  Assessment/Plan: Gangrene of foot/LEFT/Peripheral artery disease -Management per vascular team -Anticipate surgical intervention with left AKA on 9/7  -continue PRN analgesics and supportive care  ESRD on HD -Nephrology on board and helping with HD treatments  -not fluid overload on exam -will follow renal service rec's  Atrial fibrillation on Coumadin -Reversal agents of FFP and vitamin K being administered on admission -INR on 9/7 1.4 -Coumadin on hold, until vascular surgery service ok for anticoagulation to be resumed  -CHADVASC = 4 -Rate controlled; will Continue amiodarone  CAD/LBBB -No reports of chest pain or other concerning ischemic-type symptomatology at this point -will monitor  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 -On amiodarone as recommended by EP  Thrombocytopenia and anemia: due to chronic disease  -Hemoglobin 8.5; will anticipate need for transfusion after amputation -Platelets at 103; no overt bleeding, will monitor -continue aranesp and IV iron  Chronic combined systolic and diastolic CHF  -Currently compensated  -volume manage with dialysis  Diabetes type 2, controlled -CBG's well controlled for inpatient -will follow A1C -continue SSI  Gastroesophageal reflux disease with esophagitis -continue Pepcid  Stage 3 skin ulcer of sacral region -Has been using Gelfoam mattress at nursing home -Air mattress while here -will continue prevention measures -wound care service will be consulted if needed  Protein-calorie malnutrition -Albumin 2.1 -Continue protein supplementation -will follow Nutritional service rec's  Code Status: Full Family Communication: no family at bedside  Disposition Plan: remains inpatient; will follow clinical response; anticipate SNF at  discharge   Consultants:  Vascular surgery  Renal servcie  Procedures:  Anticipated left AKA 12/31/14  Antibiotics:  Zinacef (prophylaxis dose)  HPI/Subjective: Complaining of LLE pain, no fever, no CP and no SOB. After FFP and vit K INR at goal for surgery (1.4)  Objective: Filed Vitals:   12/31/14 0850  BP: 126/70  Pulse: 72  Temp:   Resp: 17    Intake/Output Summary (Last 24 hours) at 12/31/14 1306 Last data filed at 12/31/14 0850  Gross per 24 hour  Intake   1728 ml  Output   2001 ml  Net   -273 ml   Filed Weights   12/31/14 0518 12/31/14 0550 12/31/14 0850  Weight: 58.877 kg (129 lb 12.8 oz) 67.6 kg (149 lb 0.5 oz) 64.5 kg (142 lb 3.2 oz)    Exam:   General:  Afebrile, no CP and no SOB. Reports pain is still present, but slightly better controlled  Cardiovascular: rate controlled, no rubs or gallops  Respiratory: no wheezing, no crackles  Abdomen: soft, NT, ND, positive BS  Musculoskeletal: right AKA healing wounds, Left leg with wet gangrene changes  Data Reviewed: Basic Metabolic Panel:  Recent Labs Lab 12/30/14 1220 12/31/14 0440 12/31/14 0648  NA 138 136 137  K 3.9 3.8 3.7  CL 99* 95* 96*  CO2 27 29 27   GLUCOSE 130* 125* 120*  BUN 33* 39* 41*  CREATININE 3.29* 3.77* 3.83*  CALCIUM 8.5* 8.3* 8.3*  PHOS  --  5.2* 5.2*   Liver Function Tests:  Recent Labs Lab 12/30/14 1220 12/31/14 0440 12/31/14 0648  AST 34 25 23  ALT 27 28 27   ALKPHOS 137* 120 122  BILITOT 0.9 0.8 0.8  PROT 6.1* 5.7* 5.8*  ALBUMIN 2.1* 2.3* 2.3*   CBC:  Recent Labs Lab 12/30/14 1220 12/31/14 0440 12/31/14 9562  WBC 10.7* 8.6 8.9  HGB 9.9* 8.9* 8.5*  HCT 31.8* 29.1* 27.7*  MCV 94.9 94.8 94.5  PLT 97* 100* 103*   BNP (last 3 results)  Recent Labs  09/25/14 0410  BNP 2227.0*   CBG:  Recent Labs Lab 12/30/14 1444 12/30/14 1720 12/30/14 2120 12/31/14 0936 12/31/14 1137  GLUCAP 113* 118* 111* 92 91    No results found for this or any  previous visit (from the past 240 hour(s)).   Studies: No results found.  Scheduled Meds: . [MAR Hold] amiodarone  400 mg Oral Daily  . [MAR Hold] cefUROXime (ZINACEF)  IV  1.5 g Intravenous 30 min Pre-Op  . [MAR Hold] cefUROXime (ZINACEF)  IV  1.5 g Intravenous To SSTC  . [MAR Hold] darbepoetin (ARANESP) injection - DIALYSIS  200 mcg Intravenous Q Wed-HD  . [MAR Hold] famotidine (PEPCID) IV  20 mg Intravenous Q24H  . [MAR Hold] feeding supplement (NEPRO CARB STEADY)  237 mL Oral BID BM  . [MAR Hold] feeding supplement (PRO-STAT SUGAR FREE 64)  30 mL Oral BID  . [MAR Hold] ferric gluconate (FERRLECIT/NULECIT) IV  125 mg Intravenous Q M,W,F-HD  . [MAR Hold] ferrous sulfate  325 mg Oral Q breakfast  . [MAR Hold] folic acid  1 mg Oral Daily  . [MAR Hold] insulin aspart  0-15 Units Subcutaneous TID WC  . [MAR Hold] insulin aspart  0-5 Units Subcutaneous QHS  . [MAR Hold] midodrine  10 mg Oral Q M,W,F-HD  . [MAR Hold] multivitamin  1 tablet Oral QHS  . [MAR Hold] OxyCODONE  15 mg Oral Q12H  . phytonadione (VITAMIN K) IV  5 mg Intravenous Once   Continuous Infusions: . sodium chloride 10 mL/hr at 12/31/14 1011    Principal Problem:   Gangrene of foot/LEFT Active Problems:   CAD (coronary artery disease)   Thrombocytopenia-chronic   Gastroesophageal reflux disease with esophagitis   Peripheral artery disease   HLD (hyperlipidemia)   Chronic combined systolic and diastolic CHF (congestive heart failure)   Diabetes type 2, controlled   Stage 3 skin ulcer of sacral region   Protein-calorie malnutrition   CKD (chronic kidney disease) stage V requiring chronic dialysis   Anemia   Supratherapeutic INR   Atrial fibrillation on Coumadin   History of ventricular tachycardia    Time spent: 25 minutes    Vassie Loll  [facility] Pager [phone]. If 7PM-7AM, please contact night-coverage at [email or web address], password [facility] 12/31/2014, 1:06 PM  LOS: 1 day

## 2014-12-31 NOTE — Interval H&P Note (Signed)
History and Physical Interval Note:  12/31/2014 12:11 PM  [name]  has presented today for surgery, with the diagnosis of LEFT FOOT WOUND  The various methods of treatment have been discussed with the patient and family. After consideration of risks, benefits and other options for treatment, the patient has consented to  Procedure(s): AMPUTATION ABOVE KNEE (Left) as a surgical intervention .  The patient's history has been reviewed, patient examined, no change in status, stable for surgery.  I have reviewed the patient's chart and labs.  Questions were answered to the patient's satisfaction.     Gretta Began

## 2014-12-31 NOTE — Anesthesia Procedure Notes (Signed)
Procedure Name: Intubation Date/Time: 12/31/2014 12:52 PM Performed by: Darcey Nora B Pre-anesthesia Checklist: Patient identified, Emergency Drugs available, Suction available and Patient being monitored Patient Re-evaluated:Patient Re-evaluated prior to inductionOxygen Delivery Method: Circle system utilized Preoxygenation: Pre-oxygenation with 100% oxygen Intubation Type: IV induction Ventilation: Mask ventilation without difficulty Laryngoscope Size: Mac and 3 Grade View: Grade II Tube type: Oral Tube size: 7.5 mm Number of attempts: 1 Airway Equipment and Method: Stylet Placement Confirmation: ETT inserted through vocal cords under direct vision,  positive ETCO2 and breath sounds checked- equal and bilateral Secured at: 21 (cm at gum) cm Tube secured with: Tape Dental Injury: Teeth and Oropharynx as per pre-operative assessment

## 2014-12-31 NOTE — H&P (View-Only) (Signed)
[facility]   Subjective  - feels ok   Objective 126/70 72 97.6 F (36.4 C) (Oral) 17 97%  Intake/Output Summary (Last 24 hours) at 12/31/14 0918 Last data filed at 12/31/14 0850  Gross per 24 hour  Intake   1728 ml  Output   2001 ml  Net   -273 ml   Left foot gangrene unchanged  Assessment/Planning: Currently on HD, started around 530 am for 3 hr session INR corrected.  Left AKA today Severe protein calorie malnutrition with no real improvement over the last month.  Continued high risk for wound issues. Continue supplements post op  Fabienne Bruns 12/31/2014 9:18 AM --  Laboratory Lab Results:  Recent Labs  12/31/14 0440 12/31/14 0649  WBC 8.6 8.9  HGB 8.9* 8.5*  HCT 29.1* 27.7*  PLT 100* 103*   BMET  Recent Labs  12/31/14 0440 12/31/14 0648  NA 136 137  K 3.8 3.7  CL 95* 96*  CO2 29 27  GLUCOSE 125* 120*  BUN 39* 41*  CREATININE 3.77* 3.83*  CALCIUM 8.3* 8.3*    COAG Lab Results  Component Value Date   INR 1.41 12/31/2014   INR 3.88* 12/30/2014   INR 1.35 12/15/2014   No results found for: PTT

## 2014-12-31 NOTE — Consult Note (Signed)
[facility]   Subjective  - feels ok   Objective 126/70 72 97.6 F (36.4 C) (Oral) 17 97%  Intake/Output Summary (Last 24 hours) at 12/31/14 0918 Last data filed at 12/31/14 0850  Gross per 24 hour  Intake   1728 ml  Output   2001 ml  Net   -273 ml   Left foot gangrene unchanged  Assessment/Planning: Currently on HD, started around 530 am for 3 hr session INR corrected.  Left AKA today Severe protein calorie malnutrition with no real improvement over the last month.  Continued high risk for wound issues. Continue supplements post op  Jameria Bradway 12/31/2014 9:18 AM --  Laboratory Lab Results:  Recent Labs  12/31/14 0440 12/31/14 0649  WBC 8.6 8.9  HGB 8.9* 8.5*  HCT 29.1* 27.7*  PLT 100* 103*   BMET  Recent Labs  12/31/14 0440 12/31/14 0648  NA 136 137  K 3.8 3.7  CL 95* 96*  CO2 29 27  GLUCOSE 125* 120*  BUN 39* 41*  CREATININE 3.77* 3.83*  CALCIUM 8.3* 8.3*    COAG Lab Results  Component Value Date   INR 1.41 12/31/2014   INR 3.88* 12/30/2014   INR 1.35 12/15/2014   No results found for: PTT      

## 2014-12-31 NOTE — Addendum Note (Signed)
Addendum  created 12/31/14 1700 by Marni Griffon, CRNA   Modules edited: Anesthesia Attestations

## 2014-12-31 NOTE — Addendum Note (Signed)
Addendum  created 12/31/14 1656 by Marni Griffon, CRNA   Modules edited: Anesthesia Events, Narrator   Narrator:  Narrator: Event Log Edited

## 2014-12-31 NOTE — Progress Notes (Signed)
Thank you for consult on Mr. Theard. He underwent R-AKA 10/06/14 and has been at [facility] for therapy. He was admitted with gangrenous changes LLE and underwent L-AKA on 09/07.  Would recommend returning to [facility] for rehab after discharge.  Will defer CIR consult for now.

## 2014-12-31 NOTE — Care Management Note (Signed)
Case Management Note  Patient Details  Name: [name] MRN: [id number] Date of Birth: 11/08/1965  Subjective/Objective:    Pt admitted with gangrene of left foot for OR today for AKA               Action/Plan: PTA pt lived at [facility]- CSW to follow for return to SNF when medically stable  Expected Discharge Date:                  Expected Discharge Plan:  Skilled Nursing Facility  In-House Referral:  Clinical Social Work  Discharge planning Services  CM Consult  Post Acute Care Choice:    Choice offered to:     DME Arranged:    DME Agency:     HH Arranged:    HH Agency:     Status of Service:  In process, will continue to follow  Medicare Important Message Given:    Date Medicare IM Given:    Medicare IM give by:    Date Additional Medicare IM Given:    Additional Medicare Important Message give by:     If discussed at Long Length of Stay Meetings, dates discussed:    Additional Comments:  Darrold Span, RN 12/31/2014, 3:05 PM

## 2014-12-31 NOTE — Transfer of Care (Signed)
Immediate Anesthesia Transfer of Care Note  Patient: [name]  Procedure(s) Performed: Procedure(s): AMPUTATION ABOVE KNEE (Left)  Patient Location: PACU  Anesthesia Type:General  Level of Consciousness: awake, alert , oriented and patient cooperative  Airway & Oxygen Therapy: Patient Spontanous Breathing and Patient connected to nasal cannula oxygen  Post-op Assessment: Report given to RN, Post -op Vital signs reviewed and stable and Patient moving all extremities  Post vital signs: Reviewed and stable  Last Vitals:  Filed Vitals:   12/31/14 1423  BP: 130/69  Pulse:   Temp: 36.8 C  Resp: 21    Complications: No apparent anesthesia complications

## 2014-12-31 NOTE — Anesthesia Postprocedure Evaluation (Signed)
  Anesthesia Post-op Note  Patient: [name]  Procedure(s) Performed: Procedure(s): AMPUTATION ABOVE KNEE (Left)  Patient Location: PACU  Anesthesia Type:General  Level of Consciousness: awake, alert , oriented and patient cooperative  Airway and Oxygen Therapy: Patient Spontanous Breathing and Patient connected to nasal cannula oxygen  Post-op Pain: mild  Post-op Assessment: Post-op Vital signs reviewed, Patient's Cardiovascular Status Stable, Respiratory Function Stable, Patent Airway, No signs of Nausea or vomiting and Pain level controlled   LLE Sensation: Decreased (gangrenous toes)          Post-op Vital Signs: Reviewed and stable  Last Vitals:  Filed Vitals:   12/31/14 1457  BP: 130/60  Pulse: 82  Temp: 36.9 C  Resp: 20    Complications: No apparent anesthesia complications

## 2014-12-31 NOTE — Anesthesia Preprocedure Evaluation (Addendum)
Anesthesia Evaluation  Patient identified by MRN, date of birth, ID band Patient awake    Reviewed: Allergy & Precautions, NPO status , Patient's Chart, lab work & pertinent test results  History of Anesthesia Complications Negative for: history of anesthetic complications  Airway Mallampati: II  TM Distance: >3 FB Neck ROM: Full    Dental  (+) Poor Dentition, Missing, Dental Advisory Given, Chipped   Pulmonary former smoker (quit 2015),    breath sounds clear to auscultation       Cardiovascular (-) angina+ CAD, + Past MI, + CABG (2012), + Peripheral Vascular Disease and +CHF  + dysrhythmias Atrial Fibrillation  Rhythm:Regular Rate:Normal  6/16 ECHO: EF 20%, valves OK '12 Stress myoview: Mild reversible ischemia within the apical segment of anterior wall and anterolateral wall. Small infarct within the inferolateral wall. Global hypokinesia. EF 29% Pt has to take Mitodrine    Neuro/Psych negative neurological ROS     GI/Hepatic Neg liver ROS, GERD  Medicated and Controlled,  Endo/Other  diabetes (glu 92), Well Controlled, Type 2, Insulin Dependent  Renal/GU Dialysis and ESRFRenal disease (TuThSa, K+ 3.8)/t-Th-Sat, but went to HE in hosp this morning     Musculoskeletal   Abdominal   Peds  Hematology  (+) Blood dyscrasia (Hb 8.5, INR 1.41 ), ,   Anesthesia Other Findings Full Beard.  Reproductive/Obstetrics                         Anesthesia Physical Anesthesia Plan  ASA: IV  Anesthesia Plan: General   Post-op Pain Management:    Induction: Intravenous  Airway Management Planned: Oral ETT  Additional Equipment:   Intra-op Plan:   Post-operative Plan: Extubation in OR  Informed Consent: I have reviewed the patients History and Physical, chart, labs and discussed the procedure including the risks, benefits and alternatives for the proposed anesthesia with the patient or authorized  representative who has indicated his/her understanding and acceptance.   Dental advisory given  Plan Discussed with: CRNA and Surgeon  Anesthesia Plan Comments: (Plan routine monitors, GETA)        Anesthesia Quick Evaluation

## 2014-12-31 NOTE — Progress Notes (Addendum)
[facility] Progress Note  Assessment/Plan: 1. Left foot gangrene with severe PAD - AKA procedure planned today 2. ESRD - TTS had HD Monday in anticipation of surgery today which was cancelled due to ^ INR of 3.88 - need hoyer weights. On HD Wed now for 3 hours and then plan a short treatment Thursday pm to get back on schedule. He has been on no heparin HD 3. Hypertension/volume - sats ok, volume ok; no indication for HD today; ordered midodrine for pre HD -tomorrow - will need to change to tts when back on schedule- I think he needs EDW lowered with serial HD 4. Anemia - Hgb 10.8 down to 8.5 - continue ESA and course of venofer - likely will require transfusion on Hd tomorrow 5. Metabolic bone disease - Ca/P ok, no vit D phoslo 6. PCMalnutrition - renal carb mod/vits, supplements 7. afib - chronic coumadin, INR high on adm now down to 1.41 - getting FFP/vit K 8. Thrombocytopenia - platelets 103 - follow  Sheffield Slider, PA-C [facility] Beeper [phone] 12/31/2014,7:29 AM  LOS: 1 day   Pt seen, examined and agree w A/P as above.  Vinson Moselle MD pager [phone]    cell [phone] 12/31/2014, 12:29 PM    Subjective:   No c/o on HD  Objective Filed Vitals:   12/31/14 0550 12/31/14 0600 12/31/14 0630 12/31/14 0706  BP: 128/67 124/65 131/68 124/65  Pulse: 64 65 68 68  Temp: 97.6 F (36.4 C)     TempSrc: Oral     Resp: 15     Weight: 67.6 kg (149 lb 0.5 oz)     SpO2: 96%      Physical Exam goal 2.5 - hoyer weight ~58.8, bed weight 67.6  EDW 66 General: comfortable supine dozing on an off Heart: RRR Lungs: no rales Abdomen: soft + ascites, some dependent edema in thighs Extremities: right AKA healing, mild erythema/edema; left leg toes black, foul odor, thigh edema Dialysis Access: right IJ left AVF  Dialysis Orders: Center: [facility] TTS 4 hr 180 EDW 67 400/800 2 K 2.25 Ca left lower AVF *6.17.16)and right IJ NO heparin Aranesp 200 8/30  venofer 100 through 9/20 no Hectorol Recent labs: Hgb 9.9 INR 5.44 9/1 - managed by NH ocrr Ca 10.2 P 2.8 (had been in 5s) iPTH 2013 never on vit D  Additional Objective Labs: Lab Results  Component Value Date   INR 1.41 12/31/2014   INR 3.88* 12/30/2014   INR 1.35 12/15/2014    Basic Metabolic Panel:  Recent Labs Lab 12/30/14 1220 12/31/14 0440  NA 138 136  K 3.9 3.8  CL 99* 95*  CO2 27 29  GLUCOSE 130* 125*  BUN 33* 39*  CREATININE 3.29* 3.77*  CALCIUM 8.5* 8.3*  PHOS  --  5.2*   Liver Function Tests:  Recent Labs Lab 12/30/14 1220 12/31/14 0440  AST 34 25  ALT 27 28  ALKPHOS 137* 120  BILITOT 0.9 0.8  PROT 6.1* 5.7*  ALBUMIN 2.1* 2.3*   CBC:  Recent Labs Lab 12/30/14 1220 12/31/14 0440 12/31/14 0649  WBC 10.7* 8.6 8.9  HGB 9.9* 8.9* 8.5*  HCT 31.8* 29.1* 27.7*  MCV 94.9 94.8 94.5  PLT 97* 100* 103*   Blood Culture    Component Value Date/Time   SDES URINE, RANDOM 12/08/2014 1915   SPECREQUEST NONE 12/08/2014 1915   CULT MULTIPLE SPECIES PRESENT, SUGGEST RECOLLECTION 12/08/2014 1915   REPTSTATUS 12/10/2014 FINAL 12/08/2014 1915  CBG:  Recent Labs Lab 12/30/14 1247 12/30/14 1444 12/30/14 1720 12/30/14 2120  GLUCAP 133* 113* 118* 111*   Medications: . sodium chloride 20 mL/hr at 12/30/14 1226   . amiodarone  400 mg Oral Daily  . cefUROXime (ZINACEF)  IV  1.5 g Intravenous 30 min Pre-Op  . cefUROXime (ZINACEF)  IV  1.5 g Intravenous To SSTC  . Darbepoetin Alfa      . darbepoetin (ARANESP) injection - DIALYSIS  200 mcg Intravenous Q Wed-HD  . famotidine (PEPCID) IV  20 mg Intravenous Q24H  . feeding supplement (NEPRO CARB STEADY)  237 mL Oral BID BM  . feeding supplement (PRO-STAT SUGAR FREE 64)  30 mL Oral BID  . ferric gluconate (FERRLECIT/NULECIT) IV  125 mg Intravenous Q M,W,F-HD  . ferrous sulfate  325 mg Oral Q breakfast  . folic acid  1 mg Oral Daily  . insulin aspart  0-15 Units Subcutaneous TID WC  . insulin aspart   0-5 Units Subcutaneous QHS  . midodrine  10 mg Oral Q M,W,F-HD  . multivitamin  1 tablet Oral QHS  . OxyCODONE  15 mg Oral Q12H  . phytonadione (VITAMIN K) IV  5 mg Intravenous Once

## 2014-12-31 NOTE — Op Note (Signed)
    OPERATIVE REPORT  DATE OF SURGERY: 12/31/2014  PATIENT: [name], 49 y.o. male MRN: [id number]  DOB: 10-Jan-1966  PRE-OPERATIVE DIAGNOSIS: Gangrene left foot  POST-OPERATIVE DIAGNOSIS:  Same  PROCEDURE: Left above-knee amputation  SURGEON:  Gretta Began, M.D.  PHYSICIAN ASSISTANT: Samantha Rhyne PA-C  ANESTHESIA:  Gen.  EBL: 150 ml  Total I/O In: 250 [I.V.:250] Out: 2150 [Other:2000; Blood:150]  BLOOD ADMINISTERED: None  DRAINS: None  SPECIMEN: Left above-knee amputation  COUNTS CORRECT:  YES  PLAN OF CARE: PACU   PATIENT DISPOSITION:  PACU - hemodynamically stable  PROCEDURE DETAILS: Patient was taken to the operative placed supine position where the area of the left leg prepped in sterile fashion. A fishmouth type incision was made several fingerbreadths above the knee. The muscle and fascia were divided in line with skin incision. The muscle did contract the anteriormost bodies in particular were dusky. This was carried down to the level of the femur. The superficial femoral artery and femoral vein were ligated with 0 Vicryls ties and divided. The sciatic nerve was exposed further proximally and was ligated with 0 Vicryls ties and divided. Periosteum was elevated off the femur. The femur was divided with a Gigli saw. Bone edges were smoothed with a bone rasp. The anterior fascia was closed the posterior fascia with interrupted 0 Vicryls figure-of-eight sutures. Wound was again irrigated with saline. Skin was closed with skin clips. Sterile dressing was applied the patient was transferred to the recovery stable condition   Gretta Began, M.D. 12/31/2014 2:33 PM

## 2015-01-01 ENCOUNTER — Encounter (HOSPITAL_COMMUNITY): Payer: Self-pay | Admitting: Vascular Surgery

## 2015-01-01 DIAGNOSIS — N189 Chronic kidney disease, unspecified: Secondary | ICD-10-CM

## 2015-01-01 DIAGNOSIS — K210 Gastro-esophageal reflux disease with esophagitis: Secondary | ICD-10-CM

## 2015-01-01 LAB — BASIC METABOLIC PANEL
Anion gap: 15 (ref 5–15)
BUN: 30 mg/dL — AB (ref 6–20)
CALCIUM: 8.4 mg/dL — AB (ref 8.9–10.3)
CO2: 23 mmol/L (ref 22–32)
CREATININE: 3.24 mg/dL — AB (ref 0.61–1.24)
Chloride: 100 mmol/L — ABNORMAL LOW (ref 101–111)
GFR calc non Af Amer: 21 mL/min — ABNORMAL LOW (ref >60)
GFR, EST AFRICAN AMERICAN: 24 mL/min — AB (ref >60)
Glucose, Bld: 101 mg/dL — ABNORMAL HIGH (ref 65–99)
Potassium: 4.9 mmol/L (ref 3.5–5.1)
SODIUM: 138 mmol/L (ref 135–145)

## 2015-01-01 LAB — PREPARE FRESH FROZEN PLASMA
UNIT DIVISION: 0
UNIT DIVISION: 0
Unit division: 0
Unit division: 0

## 2015-01-01 LAB — GLUCOSE, CAPILLARY
GLUCOSE-CAPILLARY: 115 mg/dL — AB (ref 65–99)
GLUCOSE-CAPILLARY: 109 mg/dL — AB (ref 65–99)
GLUCOSE-CAPILLARY: 194 mg/dL — AB (ref 65–99)

## 2015-01-01 LAB — CBC
HCT: 29.7 % — ABNORMAL LOW (ref 39.0–52.0)
Hemoglobin: 9.4 g/dL — ABNORMAL LOW (ref 13.0–17.0)
MCH: 29.6 pg (ref 26.0–34.0)
MCHC: 31.6 g/dL (ref 30.0–36.0)
MCV: 93.4 fL (ref 78.0–100.0)
PLATELETS: 122 10*3/uL — AB (ref 150–400)
RBC: 3.18 MIL/uL — AB (ref 4.22–5.81)
RDW: 18.6 % — AB (ref 11.5–15.5)
WBC: 11.9 10*3/uL — AB (ref 4.0–10.5)

## 2015-01-01 LAB — HEMOGLOBIN A1C
Hgb A1c MFr Bld: 5.3 % (ref 4.8–5.6)
Mean Plasma Glucose: 105 mg/dL

## 2015-01-01 MED ORDER — GABAPENTIN 100 MG PO CAPS
100.0000 mg | ORAL_CAPSULE | Freq: Three times a day (TID) | ORAL | Status: DC
  Administered 2015-01-01 – 2015-01-03 (×6): 100 mg via ORAL
  Filled 2015-01-01 (×12): qty 1

## 2015-01-01 MED ORDER — OXYCODONE HCL 5 MG PO TABS
ORAL_TABLET | ORAL | Status: AC
  Filled 2015-01-01: qty 1

## 2015-01-01 MED ORDER — MORPHINE SULFATE (PF) 4 MG/ML IV SOLN
3.0000 mg | INTRAVENOUS | Status: DC | PRN
  Administered 2015-01-01 – 2015-01-03 (×6): 3 mg via INTRAVENOUS
  Filled 2015-01-01 (×7): qty 1

## 2015-01-01 MED ORDER — OXYCODONE HCL 5 MG PO TABS
5.0000 mg | ORAL_TABLET | ORAL | Status: DC | PRN
  Administered 2015-01-01 – 2015-01-02 (×4): 5 mg via ORAL
  Filled 2015-01-01 (×3): qty 1

## 2015-01-01 MED ORDER — PRO-STAT SUGAR FREE PO LIQD
30.0000 mL | Freq: Two times a day (BID) | ORAL | Status: DC
  Administered 2015-01-01 – 2015-01-03 (×4): 30 mL via ORAL
  Filled 2015-01-01 (×8): qty 30

## 2015-01-01 MED ORDER — NEPRO/CARBSTEADY PO LIQD
237.0000 mL | Freq: Three times a day (TID) | ORAL | Status: DC
  Administered 2015-01-01 – 2015-01-03 (×7): 237 mL via ORAL
  Filled 2015-01-01 (×10): qty 237

## 2015-01-01 MED ORDER — WARFARIN SODIUM 2 MG PO TABS
2.0000 mg | ORAL_TABLET | Freq: Once | ORAL | Status: AC
Start: 2015-01-01 — End: 2015-01-01
  Administered 2015-01-01: 2 mg via ORAL
  Filled 2015-01-01 (×2): qty 1

## 2015-01-01 MED ORDER — WARFARIN - PHARMACIST DOSING INPATIENT
Freq: Every day | Status: DC
  Administered 2015-01-01: 18:00:00

## 2015-01-01 MED ORDER — BOOST / RESOURCE BREEZE PO LIQD
1.0000 | Freq: Three times a day (TID) | ORAL | Status: DC
  Administered 2015-01-01 – 2015-01-02 (×4): 1 via ORAL
  Administered 2015-01-02: 1.9875 via ORAL
  Administered 2015-01-03 (×2): 1 via ORAL

## 2015-01-01 NOTE — Evaluation (Signed)
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   Clinical Impression   Patient presenting with decreased independence with ADL, functional mobility, dynamic sitting balance and increased pain secondary to above. Patient required assistance PTA, but states he was able to perform stand pivot transfer <> w/c. Patient currently requires total assist for ADLs, mod assist +2 for bed mobility, and max assist +2 for A/P transfer. Patient will benefit from acute OT to increase overall independence in the areas of ADLs, functional mobility, and overall safety in order to safely discharge to venue listed below.     Follow Up Recommendations  SNF;Supervision/Assistance - 24 hour    Equipment Recommendations  Other (comment) (TBD)    Recommendations for Other Services  None at this time    Precautions / Restrictions Precautions Precautions: Fall Restrictions Weight Bearing Restrictions: Yes RLE Weight Bearing: Non weight bearing LLE Weight Bearing: Non weight bearing    Mobility Bed Mobility Overal bed mobility: Needs Assistance;+2 for physical assistance Bed Mobility: Supine to Sit     Supine to sit: Mod assist;+2 for physical assistance;HOB elevated     General bed mobility comments: Pt angled shoulders to the R and hips to the L and was able to pull to sitting with mod assist +2. Worked to angle body before supine>sit to minimize scooting for pt due to pain.   Transfers Overall transfer level: Needs assistance   Transfers: Licensed conveyancer transfers: Max assist;+2 safety/equipment   General transfer comment: Pillow under L residual limb and bed pad  under hips. Pt was cued to assist in scooting posteriorly, and therapists used bed pad for scooting assist and pillow for pain control. Pt wanting to move very small amounts at a time, and increased time was required in between scoots for pain to settle down.     Balance Overall balance assessment: Needs assistance Sitting-balance support: Feet unsupported;Bilateral upper extremity supported Sitting balance-Leahy Scale: Poor Sitting balance - Comments: Mostly due to pain       Standing balance comment: unable    ADL Overall ADL's : Needs assistance/impaired General ADL Comments: Pt overall total assist for ADLs secondary to pain and decreased balance in sitting. Recommending bed level ADLs at this time. Pt max assist +2 for basic bed>w/c transfer (A/P transfer)    Pertinent Vitals/Pain Pain Assessment: 0-10 Pain Score: 9  Pain Location: LLE Pain Descriptors / Indicators: Operative site guarding;Tender (phantom) Pain Intervention(s): Limited activity within patient's tolerance;Monitored during session;Repositioned;Patient requesting pain meds-RN notified;RN gave pain meds during session     Hand Dominance Right   Extremity/Trunk Assessment Upper Extremity Assessment Upper Extremity Assessment: Generalized weakness (pt can benefit from functional strengthening HEP)   Lower Extremity Assessment Lower Extremity Assessment: Defer to PT evaluation RLE Deficits / Details: High AKA. Drainage noted on pad and no bandage covering incision. Pt report mild-moderate pain in the R residual limb RLE Sensation:  (Pt reports phantom pain) LLE Deficits / Details: Pt reports intense pain in the L residual limb and often rubs the quad to ease pain. LLE Sensation:  (Hypersensitive at this time; pt reports phantom pain)   Cervical /  Trunk Assessment Cervical / Trunk Assessment: Normal   Communication Communication Communication: No difficulties   Cognition Arousal/Alertness:  Awake/alert Behavior During Therapy: Anxious Overall Cognitive Status: Within Functional Limits for tasks assessed              Home Living Family/patient expects to be discharged to:: Skilled nursing facility   Prior Functioning/Environment Level of Independence: Needs assistance  Gait / Transfers Assistance Needed: Pt states he was able to stand on his LLE with therapy prior to this admission     Comments: Has been at [facility] for rehab. States he could transfer to w/c with someone standing by and that he could bath self and that it took "forever" to dress himself    OT Diagnosis: Generalized weakness;Acute pain   OT Problem List: Decreased strength;Decreased activity tolerance;Impaired balance (sitting and/or standing);Decreased safety awareness;Pain;Decreased knowledge of use of DME or AE   OT Treatment/Interventions: Self-care/ADL training;Therapeutic exercise;Energy conservation;DME and/or AE instruction;Therapeutic activities;Patient/family education;Balance training    OT Goals(Current goals can be found in the care plan section) ADL Goals Pt Will Perform Grooming: with supervision;sitting (unsupported) Pt Will Perform Upper Body Bathing: with supervision;sitting (unsupported) Pt Will Perform Upper Body Dressing: with supervision;sitting (unsupported) Pt Will Transfer to Toilet: with mod assist;bedside commode;anterior/posterior transfer Pt/caregiver will Perform Home Exercise Program: Increased ROM;Increased strength;Both right and left upper extremity;With theraband;With Supervision;With written HEP provided Additional ADL Goal #1: Pt will maintain dynamic sitting balance, unsupported with supervision during ADL  OT Frequency: Min 2X/week   Barriers to D/C: Decreased caregiver support       Co-evaluation PT/OT/SLP Co-Evaluation/Treatment: Yes Reason for Co-Treatment: Complexity of the patient's impairments (multi-system involvement);For patient/therapist safety PT  goals addressed during session: Mobility/safety with mobility;Balance OT goals addressed during session: ADL's and self-care;Strengthening/ROM      End of Session Nurse Communication: Mobility status  Activity Tolerance: Patient limited by pain Patient left: in chair;with call bell/phone within reach;with family/visitor present   Time: 1005-1047 OT Time Calculation (min): 42 min Charges:  OT General Charges $OT Visit: 1 Procedure OT Evaluation $Initial OT Evaluation Tier I: 1 Procedure  Derric Dealmeida , MS, OTR/L, CLT Pager: [phone]  01/01/2015, 1:03 PM

## 2015-01-01 NOTE — Progress Notes (Signed)
Pt back from dialysis, heart monitor not with pt, dialysis nurse states it is still on her unit. Nurse went back to get monitor. Pt in bed, set up for supper, meds given. Francee Piccolo Charity fundraiser

## 2015-01-01 NOTE — Progress Notes (Signed)
[facility] PROGRESS NOTE  Rashed Edler UEA:[id number] DOB: 22-Apr-1966 DOA: 12/30/2014 PCP: [name], NP  Assessment/Plan: Gangrene of foot/LEFT/Peripheral artery disease -Management per vascular team -s/p left AKA on 9/7  -continue PRN analgesics and supportive care -will add neurontin for better control  ESRD on HD -Nephrology on board and helping with HD treatments  -not fluid overload findings on exam -will follow renal service rec's  Atrial fibrillation on Coumadin -will follow PT/INR -Coumadin ok to be resumed as per vascular surgery service rec's  -CHADVASC = 4 -Rate controlled; will Continue amiodarone  CAD/LBBB -No reports of chest pain or other concerning ischemic-type symptomatology at this point -will monitor  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 -On amiodarone as recommended by EP  Thrombocytopenia and anemia: due to chronic disease  -Hemoglobin 9.4; will follow trend -if needed will transfuse with HD -Platelets at 122; no overt bleeding, will monitor -continue aranesp and IV iron as per renal discretion   Chronic combined systolic and diastolic CHF  -Currently compensated  -volume to continue to be manage with dialysis  Diabetes type 2, controlled -CBG's well controlled for inpatient -A1C 5.3 -continue SSI  Gastroesophageal reflux disease with esophagitis -continue PPI  Stage 3 skin ulcer of sacral region -Has been using Gelfoam mattress at nursing home -Air mattress while here -will continue prevention measures -wound care service will be consulted if needed  Protein-calorie malnutrition -Albumin 2.1 -Continue protein supplementation -will follow Nutritional service rec's -patient on breeze, nepro TID and prostat   Code Status: Full Family Communication: no family at bedside  Disposition Plan: remains inpatient; will follow clinical response; anticipate SNF  at discharge in the next 1-2 days once pain better control   Consultants:  Vascular surgery  Renal servcie  Procedures:  Anticipated left AKA 12/31/14  Antibiotics:  Zinacef (prophylaxis dose)  HPI/Subjective: Complaining of LLE pain, no fever, no CP and no SOB. Patient tolerated procedure well and w/o significant complications. Due for HD today.  Objective: Filed Vitals:   01/01/15 1500  BP: 134/61  Pulse: 81  Temp:   Resp:     Intake/Output Summary (Last 24 hours) at 01/01/15 1526 Last data filed at 01/01/15 0820  Gross per 24 hour  Intake    480 ml  Output      0 ml  Net    480 ml   Filed Weights   12/31/14 0550 12/31/14 0850 01/01/15 1425  Weight: 67.6 kg (149 lb 0.5 oz) 64.5 kg (142 lb 3.2 oz) 63.5 kg (139 lb 15.9 oz)    Exam:   General:  Afebrile, no CP and no SOB. Reports pain is still present; underweight and frail; no CP or SOB  Cardiovascular: rate controlled, no rubs or gallops  Respiratory: no wheezing, no crackles  Abdomen: soft, NT, ND, positive BS  Musculoskeletal: right AKA healing wounds, Left AKA with dry clean dressings in place.  Data Reviewed: Basic Metabolic Panel:  Recent Labs Lab 12/30/14 1220 12/31/14 0440 12/31/14 0648 12/31/14 1606 01/01/15 0422  NA 138 136 137 138 138  K 3.9 3.8 3.7 4.0 4.9  CL 99* 95* 96* 99* 100*  CO2 27 29 27 25 23   GLUCOSE 130* 125* 120* 116* 101*  BUN 33* 39* 41* 23* 30*  CREATININE 3.29* 3.77* 3.83* 2.74*  2.66* 3.24*  CALCIUM 8.5* 8.3* 8.3* 8.5* 8.4*  PHOS  --  5.2* 5.2* 3.9  --    Liver Function Tests:  Recent Labs Lab 12/30/14 1220 12/31/14 0440 12/31/14 0648 12/31/14 1606  AST 34 25 23  --   ALT --   ALKPHOS 137* 120 122  --   BILITOT 0.9 0.8 0.8  --   PROT 6.1* 5.7* 5.8*  --   ALBUMIN 2.1* 2.3* 2.3* 2.4*   CBC:  Recent Labs Lab 12/30/14 1220 12/31/14 0440 12/31/14 0649 12/31/14 1606 01/01/15 0422  WBC 10.7* 8.6 8.9 10.1 11.9*  HGB 9.9* 8.9* 8.5* 10.1*  9.4*  HCT 31.8* 29.1* 27.7* 33.3* 29.7*  MCV 94.9 94.8 94.5 96.0 93.4  PLT 97* 100* 103* 117* 122*   BNP (last 3 results)  Recent Labs  09/25/14 0410  BNP 2227.0*   CBG:  Recent Labs Lab 12/31/14 1425 12/31/14 1606 12/31/14 2021 01/01/15 0619 01/01/15 1127  GLUCAP 105* 106* 110* 115* 109*    Studies: No results found.  Scheduled Meds: . amiodarone  400 mg Oral Daily  . cefUROXime (ZINACEF)  IV  1.5 g Intravenous 30 min Pre-Op  . darbepoetin (ARANESP) injection - DIALYSIS  200 mcg Intravenous Q Wed-HD  . docusate sodium  100 mg Oral Daily  . feeding supplement  1 Container Oral TID BM  . feeding supplement (NEPRO CARB STEADY)  237 mL Oral TID BM  . feeding supplement (PRO-STAT SUGAR FREE 64)  30 mL Oral BID  . feeding supplement (PRO-STAT SUGAR FREE 64)  30 mL Oral BID  . ferric gluconate (FERRLECIT/NULECIT) IV  125 mg Intravenous Q M,W,F-HD  . ferrous sulfate  325 mg Oral Q breakfast  . folic acid  1 mg Oral Daily  . gabapentin  100 mg Oral TID  . insulin aspart  0-15 Units Subcutaneous TID WC  . insulin aspart  0-5 Units Subcutaneous QHS  . midodrine  10 mg Oral Q M,W,F-HD  . multivitamin  1 tablet Oral QHS  . OxyCODONE  15 mg Oral Q12H  . pantoprazole  40 mg Oral Daily  . warfarin  2 mg Oral ONCE-1800  . Warfarin - Pharmacist Dosing Inpatient   Does not apply q1800   Continuous Infusions: . sodium chloride 10 mL/hr at 12/31/14 1011    Principal Problem:   Gangrene of foot/LEFT Active Problems:   CAD (coronary artery disease)   Thrombocytopenia-chronic   Gastroesophageal reflux disease with esophagitis   Peripheral artery disease   HLD (hyperlipidemia)   Chronic combined systolic and diastolic CHF (congestive heart failure)   Diabetes type 2, controlled   Stage 3 skin ulcer of sacral region   Protein-calorie malnutrition   CKD (chronic kidney disease) stage V requiring chronic dialysis   Anemia   Supratherapeutic INR   Atrial fibrillation on  Coumadin   History of ventricular tachycardia    Time spent: 30 minutes    Vassie Loll  [facility] Pager [phone]. If 7PM-7AM, please contact night-coverage at [email or web address], password [facility] 01/01/2015, 3:26 PM  LOS: 2 days

## 2015-01-01 NOTE — Progress Notes (Signed)
[facility] Progress Note  Assessment/Plan: 1. 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 ESRD - TTS for HD today 3. Hypertension/volume - vol ok, possibly lower edw, on midodrine pre HD 4. Anemia - Hgb 10.8 down to 8.5 - continue ESA and course of venofer 5. Metabolic bone disease - Ca/P ok, no vit D phoslo 6. PCMalnutrition - renal carb mod/vits, supplements 7. afib - chronic coumadin, INR high on adm now down to 1.41 - getting FFP/vit K 8. Thrombocytopenia - platelets 103 - follow   Vinson Moselle MD pager [phone]    cell [phone] 01/01/2015, 12:27 PM    Subjective:   No c/o on HD  Objective Filed Vitals:   12/31/14 1457 12/31/14 1519 12/31/14 2024 01/01/15 0414  BP: 130/60 140/59 136/54 147/64  Pulse: 82 85 80 80  Temp: 98.4 F (36.9 C) 98.6 F (37 C) 98 F (36.7 C) 98.4 F (36.9 C)  TempSrc:  Oral Oral Oral  Resp: 20 20 21 19   Weight:      SpO2: 100% 95% 97% 99%   Physical Exam goal 2.5 - hoyer weight ~58.8, bed weight 67.6  EDW 66 General: comfortable supine dozing on an off Heart: RRR Lungs: no rales Abdomen: soft + ascites, some dependent edema in thighs Extremities: right AKA healing, mild erythema/edema; left leg toes black, foul odor, thigh edema Dialysis Access: right IJ left AVF  Dialysis Orders: Center: [facility] TTS 4 hr 180 EDW 67 400/800 2 K 2.25 Ca left lower AVF *6.17.16)and right IJ NO heparin Aranesp 200 8/30 venofer 100 through 9/20 no Hectorol Recent labs: Hgb 9.9 INR 5.44 9/1 - managed by NH ocrr Ca 10.2 P 2.8 (had been in 5s) iPTH 2013 never on vit D  Additional Objective Labs: Lab Results  Component Value Date   INR 1.41 12/31/2014   INR 3.88* 12/30/2014   INR 1.35 12/15/2014    Basic Metabolic Panel:  Recent Labs Lab 12/31/14 0440 12/31/14 0648 12/31/14 1606 01/01/15 0422  NA 136 137 138 138  K 3.8 3.7 4.0 4.9  CL 95* 96* 99* 100*  CO2 29 27 25 23   GLUCOSE 125* 120* 116* 101*  BUN  39* 41* 23* 30*  CREATININE 3.77* 3.83* 2.74*  2.66* 3.24*  CALCIUM 8.3* 8.3* 8.5* 8.4*  PHOS 5.2* 5.2* 3.9  --    Liver Function Tests:  Recent Labs Lab 12/30/14 1220 12/31/14 0440 12/31/14 0648 12/31/14 1606  AST 34 25 23  --   ALT 27 28 27   --   ALKPHOS 137* 120 122  --   BILITOT 0.9 0.8 0.8  --   PROT 6.1* 5.7* 5.8*  --   ALBUMIN 2.1* 2.3* 2.3* 2.4*   CBC:  Recent Labs Lab 12/30/14 1220 12/31/14 0440 12/31/14 0649 12/31/14 1606 01/01/15 0422  WBC 10.7* 8.6 8.9 10.1 11.9*  HGB 9.9* 8.9* 8.5* 10.1* 9.4*  HCT 31.8* 29.1* 27.7* 33.3* 29.7*  MCV 94.9 94.8 94.5 96.0 93.4  PLT 97* 100* 103* 117* 122*   Blood Culture    Component Value Date/Time   SDES URINE, RANDOM 12/08/2014 1915   SPECREQUEST NONE 12/08/2014 1915   CULT MULTIPLE SPECIES PRESENT, SUGGEST RECOLLECTION 12/08/2014 1915   REPTSTATUS 12/10/2014 FINAL 12/08/2014 1915    CBG:  Recent Labs Lab 12/31/14 1425 12/31/14 1606 12/31/14 2021 01/01/15 0619 01/01/15 1127  GLUCAP 105* 106* 110* 115* 109*   Medications: . sodium chloride 10 mL/hr at 12/31/14 1011   .  amiodarone  400 mg Oral Daily  . cefUROXime (ZINACEF)  IV  1.5 g Intravenous 30 min Pre-Op  . darbepoetin (ARANESP) injection - DIALYSIS  200 mcg Intravenous Q Wed-HD  . docusate sodium  100 mg Oral Daily  . feeding supplement (NEPRO CARB STEADY)  237 mL Oral BID BM  . feeding supplement (PRO-STAT SUGAR FREE 64)  30 mL Oral BID  . ferric gluconate (FERRLECIT/NULECIT) IV  125 mg Intravenous Q M,W,F-HD  . ferrous sulfate  325 mg Oral Q breakfast  . folic acid  1 mg Oral Daily  . insulin aspart  0-15 Units Subcutaneous TID WC  . insulin aspart  0-5 Units Subcutaneous QHS  . midodrine  10 mg Oral Q M,W,F-HD  . multivitamin  1 tablet Oral QHS  . OxyCODONE  15 mg Oral Q12H  . pantoprazole  40 mg Oral Daily  . warfarin  2 mg Oral ONCE-1800  . Warfarin - Pharmacist Dosing Inpatient   Does not apply [phone]

## 2015-01-01 NOTE — Evaluation (Signed)
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  Clinical Impression  Pt admitted with above diagnosis. Pt currently with functional limitations due to the deficits listed below (see PT Problem List). At the time of PT eval pt was able to perform anterior>posterior transfer from bed to wheelchair. +2 assist required. Pt may be more successful with lateral scoot transfer next session. Pain was a major limiting factor and would recommend coordinating pain meds with nursing prior to next session. Pt will benefit from skilled PT to increase their independence and safety with mobility to allow discharge to the venue listed below.       Follow Up Recommendations SNF;Supervision/Assistance - 24 hour    Equipment Recommendations  None recommended by PT    Recommendations for Other Services       Precautions / Restrictions Precautions Precautions: Fall Restrictions Weight Bearing Restrictions: Yes RLE Weight Bearing: Non weight bearing LLE Weight Bearing: Non weight bearing      Mobility  Bed Mobility Overal bed mobility: Needs Assistance;+2 for physical assistance Bed Mobility: Supine to Sit     Supine to sit: Mod assist;+2 for physical assistance;HOB elevated     General bed mobility comments: Pt angled shoulders to the R and hips to the L and was able to pull to sitting with mod assist +2. Worked to angle body before supine>sit to minimize scooting for pt due to pain.   Transfers Overall transfer level: Needs assistance   Transfers: Licensed conveyancer transfers: Max assist;+2 safety/equipment   General transfer comment: Pillow under L residual limb and  bed pad under hips. Pt was cued to assist in scooting posteriorly, and therapists used bed pad for scooting assist and pillow for pain control. Pt wanting to move very small amounts at a time, and increased time was required in between scoots for pain to settle down.   Ambulation/Gait                Stairs            Wheelchair Mobility    Modified Rankin (Stroke Patients Only)       Balance Overall balance assessment: Needs assistance Sitting-balance support: Feet unsupported;Bilateral upper extremity supported Sitting balance-Leahy Scale: Poor Sitting balance - Comments: Mostly due to pain       Standing balance comment: unable                             Pertinent Vitals/Pain Pain Assessment: 0-10 Pain Score: 9  Pain Location: LLE Pain Descriptors / Indicators: Operative site guarding;Tender (phantom) Pain Intervention(s): Limited activity within patient's tolerance;Monitored during session;Repositioned;Patient requesting pain meds-RN notified;RN gave pain meds during session    Home Living Family/patient expects to be discharged to:: Skilled nursing facility                      Prior Function Level of Independence: Needs assistance   Gait / Transfers Assistance Needed: Pt states he was able to stand on his LLE with therapy prior to this admission     Comments: Has been  at [facility] for rehab. States he could transfer to w/c with someone standing by and that he could bath self and that it took "forever" to dress himself     Hand Dominance   Dominant Hand: Right    Extremity/Trunk Assessment   Upper Extremity Assessment: Generalized weakness (pt can benefit from functional strengthening HEP)           Lower Extremity Assessment: Defer to PT evaluation RLE Deficits / Details: High AKA. Drainage noted on pad and no bandage covering incision. Pt report mild-moderate pain in the R residual limb LLE Deficits / Details: Pt  reports intense pain in the L residual limb and often rubs the quad to ease pain.  Cervical / Trunk Assessment: Normal  Communication   Communication: No difficulties  Cognition Arousal/Alertness: Awake/alert Behavior During Therapy: Anxious Overall Cognitive Status: Within Functional Limits for tasks assessed                      General Comments      Exercises        Assessment/Plan    PT Assessment Patient needs continued PT services  PT Diagnosis Generalized weakness;Acute pain   PT Problem List Decreased strength;Decreased range of motion;Decreased activity tolerance;Decreased balance;Decreased mobility;Decreased knowledge of use of DME;Decreased safety awareness;Decreased knowledge of precautions;Pain  PT Treatment Interventions DME instruction;Functional mobility training;Therapeutic activities;Therapeutic exercise;Neuromuscular re-education;Patient/family education;Wheelchair mobility training   PT Goals (Current goals can be found in the Care Plan section) Acute Rehab PT Goals Patient Stated Goal: Return to rehab PT Goal Formulation: With patient Time For Goal Achievement: 01/15/15 Potential to Achieve Goals: Good    Frequency Min 3X/week   Barriers to discharge        Co-evaluation PT/OT/SLP Co-Evaluation/Treatment: Yes Reason for Co-Treatment: Complexity of the patient's impairments (multi-system involvement);For patient/therapist safety PT goals addressed during session: Mobility/safety with mobility;Balance OT goals addressed during session: ADL's and self-care;Strengthening/ROM       End of Session   Activity Tolerance: Patient limited by pain Patient left: with family/visitor present;with call bell/phone within reach;Other (comment) (In wheelchair with cushion) Nurse Communication: Mobility status;Need for lift equipment;Other (comment) (Recommendation for B amputee sling for Maxi-Sky)         Time: 1005-1047 PT Time Calculation (min)  (ACUTE ONLY): 42 min   Charges:   PT Evaluation $Initial PT Evaluation Tier I: 1 Procedure PT Treatments $Therapeutic Activity: 8-22 mins   PT G Codes:        Conni Slipper 03-Jan-2015, 3:03 PM   Conni Slipper, PT, DPT Acute Rehabilitation Services Pager: [phone]

## 2015-01-01 NOTE — Progress Notes (Signed)
ANTICOAGULATION CONSULT NOTE - Initial Consult  Pharmacy Consult for coumadin Indication: afib  No Known Allergies  Patient Measurements: Weight: 142 lb 3.2 oz (64.5 kg) (bed)  Vital Signs: Temp: 98.4 F (36.9 C) (09/08 0414) Temp Source: Oral (09/08 0414) BP: 147/64 mmHg (09/08 0414) Pulse Rate: 80 (09/08 0414)  Labs:  Recent Labs  12/30/14 1220 12/31/14 0440 12/31/14 0648 12/31/14 0649 12/31/14 1606 01/01/15 0422  HGB 9.9* 8.9*  --  8.5* 10.1* 9.4*  HCT 31.8* 29.1*  --  27.7* 33.3* 29.7*  PLT 97* 100*  --  103* 117* 122*  APTT 63* 50*  --   --   --   --   LABPROT 37.1* 17.4*  --   --   --   --   INR 3.88* 1.41  --   --   --   --   CREATININE 3.29* 3.77* 3.83*  --  2.74*  2.66* 3.24*    Estimated Creatinine Clearance: 24.9 mL/min (by C-G formula based on Cr of 3.24).   Medical History: Past Medical History  Diagnosis Date  . Coronary artery disease 07/05/2011    2D ECHO - EF ~40%, moderate concentric LV hypertrophy, LA moderately dilated, mild-moderate septal and inferior wall hypokinesis  . Hypertension   . CHF (congestive heart failure)   . Diabetes mellitus     Insulin dependent  . Cardiomyopathy   . Claudication 05/09/2011    Right and left anterior tibial arteries and Left SFA-occluded; Right CIA-<50% diameter reduction; Right Deep Profunda-70-99% diameter reduction; Right SFA->60% diameter reduction; Right Distal Popliteal/Tibial Artery- >60% diameter reduction; Left CFA and Profunda- >50% diameter reduction  . 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 GERD (gastroesophageal reflux disease)   . Peripheral vascular disease   . 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 Chronic kidney disease (CKD), stage IV (severe)     Dialysis - t/th/Sa      Assessment: 49 yo male from [facility] on coumadin PTA for afib. He is now s/p AKA on 12/31/14 and per Dr. Darrick Penna may resume coumadin today. Of note INR was 3.8 on admit (9/6) after being off coumadin for 4 days (as of 8/24, it appears that the patient was to be on  x1 then /day).  -INR 1.41 on 9/7  coumadin regimen at NH: typical regimen appears to be 1 mg daily x 0.5 mg on Thurs/Sat.  Goal of Therapy:  INR 2-3 Monitor platelets by anticoagulation protocol: Yes   Plan:  -Coumadin  po x1 -Daily PT/INR  Harland German, Pharm D 01/01/2015 9:46 AM

## 2015-01-01 NOTE — Progress Notes (Addendum)
Initial Nutrition Assessment  DOCUMENTATION CODES:   Severe malnutrition in context of chronic illness  INTERVENTION:   Boost Breeze po TID, each supplement provides 250 kcal and 9 grams of protein  Prostat liquid protein po 30 ml BID with meals, each supplement provides 100 kcal, 15 grams protein  NUTRITION DIAGNOSIS:   Increased nutrient needs related to chronic illness as evidenced by estimated needs  GOAL:   Patient will meet greater than or equal to 90% of their needs  MONITOR:   PO intake, Supplement acceptance, Labs, Weight trends, Skin, I & O's  REASON FOR ASSESSMENT:   Consult Assessment of nutrition requirement/status  ASSESSMENT:   49 y/o male with ESRD on TTS HD, DM, HTN, 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  Patient s/p procedure 9/7: LEFT ABOVE-KNEE AMPUTATION  Multiple family members visiting pt upon RD visit.  Pt known to this RD during previous hospital admission. + hx of malnutrition which is ongoing. Pt typically snacks during the day at home (vs eat consistent meals). Does not like Nepro Shake supplements as it gave him gas, bloating and belching. RD to order Boost Breeze (clear liquid supplement) at this time.  Nutrition-Focused physical exam completed. Findings are severe fat depletion, severe muscle depletion, and mild edema.   Diet Order:  Diet renal/carb modified with fluid restriction Diet-HS Snack?: Nothing; Room service appropriate?: Yes; Fluid consistency:: Thin  Skin:  unstageable sacral pressure ulcer  Last BM:  9/7  Height:   Ht Readings from Last 1 Encounters:  12/25/14  (1.803 m)    Weight:   Wt Readings from Last 1 Encounters:  01/01/15 139 lb 15.9 oz (63.5 kg)    Ideal Body Weight:  66 kg -- adjusted for amputations   BMI:  Body mass index is 19.53 kg/(m^2).  Estimated Nutritional Needs:   Kcal:   2100-2300  Protein:  105-115 gm  Fluid:  per MD  EDUCATION NEEDS:   No education needs identified at this time  Maureen Chatters, RD, LDN Pager #: [phone] After-Hours Pager #: [phone]

## 2015-01-01 NOTE — Progress Notes (Addendum)
  [facility] Progress Note  01/01/2015 7:46 AM POD 1  Subjective:  No complaints.   Filed Vitals:   01/01/15 0414  BP: 147/64  Pulse: 80  Temp: 98.4 F (36.9 C)  Resp: 19    Physical Exam: Left AKA dressing clean. Right AKA with mild erythema around incision. Otherwise clean and intact.   CBC    Component Value Date/Time   WBC 11.9* 01/01/2015 0422   RBC 3.18* 01/01/2015 0422   HGB 9.4* 01/01/2015 0422   HCT 29.7* 01/01/2015 0422   PLT 122* 01/01/2015 0422   MCV 93.4 01/01/2015 0422   MCH 29.6 01/01/2015 0422   MCHC 31.6 01/01/2015 0422   RDW 18.6* 01/01/2015 0422   LYMPHSABS 0.4* 12/08/2014 1338   MONOABS 1.0 12/08/2014 1338   EOSABS 0.0 12/08/2014 1338   BASOSABS 0.0 12/08/2014 1338    BMET    Component Value Date/Time   NA 138 01/01/2015 0422   K 4.9 01/01/2015 0422   CL 100* 01/01/2015 0422   CO2 23 01/01/2015 0422   GLUCOSE 101* 01/01/2015 0422   BUN 30* 01/01/2015 0422   CREATININE 3.24* 01/01/2015 0422   CALCIUM 8.4* 01/01/2015 0422   GFRNONAA 21* 01/01/2015 0422   GFRAA 24* 01/01/2015 0422    INR    Component Value Date/Time   INR 1.41 12/31/2014 0440     Intake/Output Summary (Last 24 hours) at 01/01/15 0746 Last data filed at 12/31/14 1849  Gross per 24 hour  Intake    490 ml  Output   2150 ml  Net  -1660 ml     Assessment/Plan:  49 y.o. male is s/p left above knee amputation  POD 1  Left dressing clean and dry. Will take down dressing tomorrow.  ABLA: stable.  Ok to resume coumadin today.  Maris Berger, PA-C [facility] Office: [phone] Pager: [phone] 01/01/2015 7:46 AM  Pain issues today Encourage nutrition D/c to SNF when pain controlled  Fabienne Bruns, MD [facility] [place] Office: [phone] Pager: [phone]

## 2015-01-02 DIAGNOSIS — Z89612 Acquired absence of left leg above knee: Secondary | ICD-10-CM

## 2015-01-02 DIAGNOSIS — Z992 Dependence on renal dialysis: Secondary | ICD-10-CM

## 2015-01-02 DIAGNOSIS — N186 End stage renal disease: Secondary | ICD-10-CM

## 2015-01-02 LAB — PROTIME-INR
INR: 1.78 — ABNORMAL HIGH (ref 0.00–1.49)
PROTHROMBIN TIME: 20.7 s — AB (ref 11.6–15.2)

## 2015-01-02 LAB — CBC
HCT: 31.2 % — ABNORMAL LOW (ref 39.0–52.0)
Hemoglobin: 9.6 g/dL — ABNORMAL LOW (ref 13.0–17.0)
MCH: 29.6 pg (ref 26.0–34.0)
MCHC: 30.8 g/dL (ref 30.0–36.0)
MCV: 96.3 fL (ref 78.0–100.0)
PLATELETS: 123 10*3/uL — AB (ref 150–400)
RBC: 3.24 MIL/uL — AB (ref 4.22–5.81)
RDW: 18.6 % — ABNORMAL HIGH (ref 11.5–15.5)
WBC: 10.4 10*3/uL (ref 4.0–10.5)

## 2015-01-02 LAB — BASIC METABOLIC PANEL
Anion gap: 13 (ref 5–15)
BUN: 22 mg/dL — ABNORMAL HIGH (ref 6–20)
CALCIUM: 8.4 mg/dL — AB (ref 8.9–10.3)
CHLORIDE: 96 mmol/L — AB (ref 101–111)
CO2: 27 mmol/L (ref 22–32)
CREATININE: 2.7 mg/dL — AB (ref 0.61–1.24)
GFR calc non Af Amer: 26 mL/min — ABNORMAL LOW (ref >60)
GFR, EST AFRICAN AMERICAN: 30 mL/min — AB (ref >60)
Glucose, Bld: 143 mg/dL — ABNORMAL HIGH (ref 65–99)
Potassium: 4.1 mmol/L (ref 3.5–5.1)
SODIUM: 136 mmol/L (ref 135–145)

## 2015-01-02 LAB — GLUCOSE, CAPILLARY
GLUCOSE-CAPILLARY: 149 mg/dL — AB (ref 65–99)
GLUCOSE-CAPILLARY: 206 mg/dL — AB (ref 65–99)
GLUCOSE-CAPILLARY: 116 mg/dL — AB (ref 65–99)
Glucose-Capillary: 169 mg/dL — ABNORMAL HIGH (ref 65–99)

## 2015-01-02 MED ORDER — WARFARIN SODIUM 1 MG PO TABS
1.0000 mg | ORAL_TABLET | Freq: Once | ORAL | Status: AC
  Administered 2015-01-02: 1 mg via ORAL
  Filled 2015-01-02: qty 1

## 2015-01-02 MED ORDER — OXYCODONE HCL 5 MG PO TABS
5.0000 mg | ORAL_TABLET | ORAL | Status: DC | PRN
  Administered 2015-01-03: 5 mg via ORAL
  Filled 2015-01-02: qty 2

## 2015-01-02 MED ORDER — OXYCODONE HCL ER 15 MG PO T12A
15.0000 mg | EXTENDED_RELEASE_TABLET | Freq: Three times a day (TID) | ORAL | Status: DC
  Administered 2015-01-02 – 2015-01-03 (×4): 15 mg via ORAL
  Filled 2015-01-02 (×4): qty 1

## 2015-01-02 NOTE — Progress Notes (Signed)
Patient has been eating poorly and was told by Dr. Darrick Penna that he could eat whatever he wanted as long as he was eating. Discontinued renal/carb modified diet and placed order for regular diet per PA Thrin. Pt resting with call bell within reach.  Will continue to monitor.

## 2015-01-02 NOTE — Progress Notes (Signed)
[facility] PROGRESS NOTE  [name] ZOX:[id number] DOB: 08-09-1965 DOA: 12/30/2014 PCP: [name], NP  Assessment/Plan: Gangrene of foot/LEFT/Peripheral artery disease -Management per vascular team -s/p left AKA on 9/7  -continue PRN analgesics and supportive care. Will continue oxycodone for breakthrough and increase OxyContin to TID (scheduled) -will add neurontin for better control  ESRD on HD -Nephrology on board and helping with HD treatments  -not fluid overload findings on exam -will follow renal service rec's  Atrial fibrillation on Coumadin -will follow PT/INR; INR 1.78 -Continue coumadin -CHADVASC = 4 -Rate controlled; will Continue amiodarone  CAD/LBBB -No reports of chest pain or other concerning ischemic-type symptomatology at this point -will monitor  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 -On amiodarone as recommended by EP  Thrombocytopenia and anemia: due to chronic disease  -Hemoglobin 9.6; will follow trend -if needed will transfuse with HD -Platelets at 123; no overt bleeding, will monitor -continue aranesp and IV iron as per renal discretion   Chronic combined systolic and diastolic CHF  -Currently compensated  -volume to continue to be manage with dialysis  Diabetes type 2, controlled -CBG's well controlled for inpatient -A1C 5.3 -continue SSI   Gastroesophageal reflux disease with esophagitis -continue PPI  Stage 3 skin ulcer of sacral region -Has been using Gelfoam mattress at nursing home -Air mattress while here -will continue prevention measures -wound care service will be consulted if needed  Protein-calorie malnutrition -Albumin 2.1 -Continue protein supplementation -will follow Nutritional service rec's -will continue breeze, nepro TID and prostat   Code Status: Full Family Communication: no family at bedside  Disposition Plan: remains  inpatient; will follow clinical response; anticipate SNF at discharge in the next 1-2 days once pain better controlled   Consultants:  Vascular surgery  Renal servcie  Procedures:  Anticipated left AKA 12/31/14  Antibiotics:  Zinacef (prophylaxis dose)  HPI/Subjective: Complaining of LLE pain, no fever, no CP and no SOB.   Objective: Filed Vitals:   01/02/15 1408  BP: 110/51  Pulse: 78  Temp: 98.4 F (36.9 C)  Resp: 18    Intake/Output Summary (Last 24 hours) at 01/02/15 1510 Last data filed at 01/02/15 1000  Gross per 24 hour  Intake    360 ml  Output   1600 ml  Net  -1240 ml   Filed Weights   01/01/15 1425 01/01/15 1755 01/02/15 0607  Weight: 63.5 kg (139 lb 15.9 oz) 61.5 kg (135 lb 9.3 oz) 63.3 kg (139 lb 8.8 oz)    Exam:   General:  Afebrile. Reports pain continue to be intolerable; underweight and frail; no CP or SOB  Cardiovascular: rate controlled, no rubs or gallops  Respiratory: no wheezing, no crackles  Abdomen: soft, NT, ND, positive BS  Musculoskeletal: right AKA healing wounds, Left AKA with dry clean dressings in place.  Data Reviewed: Basic Metabolic Panel:  Recent Labs Lab 12/31/14 0440 12/31/14 0648 12/31/14 1606 01/01/15 0422 01/02/15 0443  NA 136 137 138 138 136  K 3.8 3.7 4.0 4.9 4.1  CL 95* 96* 99* 100* 96*  CO2 GLUCOSE 125* 120* 116* 101* 143*  BUN 39* 41* 23* 30* 22*  CREATININE 3.77* 3.83* 2.74*  2.66* 3.24* 2.70*  CALCIUM 8.3* 8.3* 8.5* 8.4* 8.4*  PHOS 5.2* 5.2* 3.9  --   --    Liver Function Tests:  Recent Labs Lab 12/30/14 1220 12/31/14 0440 12/31/14 0648 12/31/14 1606  AST 34 25 23  --   ALT 27 28 27   --   ALKPHOS 137* 120 122  --   BILITOT 0.9 0.8 0.8  --   PROT 6.1* 5.7* 5.8*  --   ALBUMIN 2.1* 2.3* 2.3* 2.4*   CBC:  Recent Labs Lab 12/31/14 0440 12/31/14 0649 12/31/14 1606 01/01/15 0422 01/02/15 0443  WBC 8.6 8.9 10.1 11.9* 10.4  HGB 8.9* 8.5* 10.1* 9.4* 9.6*  HCT 29.1*  27.7* 33.3* 29.7* 31.2*  MCV 94.8 94.5 96.0 93.4 96.3  PLT 100* 103* 117* 122* 123*   BNP (last 3 results)  Recent Labs  09/25/14 0410  BNP 2227.0*   CBG:  Recent Labs Lab 01/01/15 0619 01/01/15 1127 01/01/15 2218 01/02/15 0646 01/02/15 1123  GLUCAP 115* 109* 194* 149* 206*    Studies: No results found.  Scheduled Meds: . amiodarone  400 mg Oral Daily  . cefUROXime (ZINACEF)  IV  1.5 g Intravenous 30 min Pre-Op  . darbepoetin (ARANESP) injection - DIALYSIS  200 mcg Intravenous Q Wed-HD  . docusate sodium  100 mg Oral Daily  . feeding supplement  1 Container Oral TID BM  . feeding supplement (NEPRO CARB STEADY)  237 mL Oral TID BM  . feeding supplement (PRO-STAT SUGAR FREE 64)  30 mL Oral BID  . ferric gluconate (FERRLECIT/NULECIT) IV  125 mg Intravenous Q M,W,F-HD  . ferrous sulfate  325 mg Oral Q breakfast  . folic acid  1 mg Oral Daily  . gabapentin  100 mg Oral TID  . insulin aspart  0-15 Units Subcutaneous TID WC  . insulin aspart  0-5 Units Subcutaneous QHS  . midodrine  10 mg Oral Q M,W,F-HD  . multivitamin  1 tablet Oral QHS  . OxyCODONE  15 mg Oral 3 times per day  . pantoprazole  40 mg Oral Daily  . warfarin  1 mg Oral ONCE-1800  . Warfarin - Pharmacist Dosing Inpatient   Does not apply q1800   Continuous Infusions: . sodium chloride 10 mL/hr at 12/31/14 1011    Principal Problem:   Gangrene of foot/LEFT Active Problems:   CAD (coronary artery disease)   Thrombocytopenia-chronic   Gastroesophageal reflux disease with esophagitis   Peripheral artery disease   HLD (hyperlipidemia)   Chronic combined systolic and diastolic CHF (congestive heart failure)   Diabetes type 2, controlled   Stage 3 skin ulcer of sacral region   Protein-calorie malnutrition   CKD (chronic kidney disease) stage V requiring chronic dialysis   Anemia   Supratherapeutic INR   Atrial fibrillation on Coumadin   History of ventricular tachycardia    Time spent: 30  minutes    Vassie Loll  [facility] Pager [phone]. If 7PM-7AM, please contact night-coverage at [email or web address], password [email or web address] 01/02/2015, 3:10 PM  LOS: 3 days

## 2015-01-02 NOTE — Progress Notes (Signed)
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    PT Comments    Patient limited with mobility due to pain in LE's and limited tolerance to scooting on sore bottom.  Agreeable to UE strengthening exercises and continued to perform some as I left the room.  Will need SNF level rehab, though motivated and has plan to be home by thanksgiving.  Follow Up Recommendations  SNF;Supervision/Assistance - 24 hour     Equipment Recommendations  None recommended by PT    Recommendations for Other Services       Precautions / Restrictions Precautions Precautions: Fall Restrictions RLE Weight Bearing: Non weight bearing LLE Weight Bearing: Non weight bearing    Mobility  Bed Mobility Overal bed mobility: Needs Assistance       Supine to sit: HOB elevated;Min assist     General bed mobility comments: min assist to pull up to sit (long sit position in bed ) for comfort, declined OOB due to severe pain in legs and bottom  Transfers                    Ambulation/Gait                 Stairs            Wheelchair Mobility    Modified Rankin (Stroke Patients Only)       Balance   Sitting-balance support: Bilateral upper extremity supported Sitting balance-Leahy Scale: Poor Sitting balance - Comments: reliant on UE support more due to pain than balance                            Cognition Arousal/Alertness: Awake/alert Behavior During Therapy: Anxious Overall Cognitive Status: Within Functional Limits for tasks assessed                      Exercises General Exercises - Upper Extremity Shoulder Horizontal ABduction: Strengthening;Both;10  reps;Supine;Theraband Theraband Level (Shoulder Horizontal Abduction): Level 2 (Red) Shoulder Horizontal ADduction: Strengthening;Both;10 reps;Theraband;Supine Theraband Level (Shoulder Horizontal Adduction): Level 2 (Red) Elbow Flexion: Strengthening;Both;10 reps;Theraband;Supine Theraband Level (Elbow Flexion): Level 2 (Red) Elbow Extension: Strengthening;Both;10 reps;Theraband;Supine Theraband Level (Elbow Extension): Level 2 (Red)    General Comments        Pertinent Vitals/Pain Pain Assessment: Faces Faces Pain Scale: Hurts whole lot Pain Location: L LE and tailbone Pain Descriptors / Indicators: Sore;Aching Pain Intervention(s): Limited activity within patient's tolerance;Monitored during session    Home Living                      Prior Function            PT Goals (current goals can now be found in the care plan section) Progress towards PT goals: Not progressing toward goals - comment (due to pain)    Frequency       PT Plan Current plan remains appropriate    Co-evaluation             End of Session   Activity Tolerance: Patient limited by pain Patient left: in bed;with call bell/phone within  reach     Time: 1330-1347 PT Time Calculation (min) (ACUTE ONLY): 17 min  Charges:  $Therapeutic Exercise: 8-22 mins                    G Codes:      Daman Steffenhagen,CYNDI Jan 21, 2015, 1:54 PM  Sheran Lawless, [place] [phone] Jan 21, 2015

## 2015-01-02 NOTE — Clinical Social Work Note (Signed)
Clinical Social Work Assessment  Patient Details  Name: [name] MRN: [id number] Date of Birth: 11-07-1965  Date of referral:  01/02/15               Reason for consult:  Facility Placement                Permission sought to share information with:  Family Supports, Oceanographer granted to share information::  Yes, Verbal Permission Granted  Name::        Agency::  [facility]  Relationship::     Contact Information:     Housing/Transportation Living arrangements for the past 2 months:  Skilled Building surveyor of Information:  Patient Patient Interpreter Needed:  None Criminal Activity/Legal Involvement Pertinent to Current Situation/Hospitalization:  No - Comment as needed Significant Relationships:  Parents, Siblings Lives with:  Facility Resident Do you feel safe going back to the place where you live?  Yes Need for family participation in patient care:  Yes (Comment)  Care giving concerns:  None at this time   Office manager / plan:  CSW spoke with pt about return to [facility] when ready for DC  Employment status:  Retired Health and safety inspector:  Armed forces operational officer, Medicaid In [place] PT Recommendations:  Skilled Nursing Facility Information / Referral to community resources:  Skilled Nursing Facility  Patient/Family's Response to care: Pt is agreeable to return to [facility]  Patient/Family's Understanding of and Emotional Response to Diagnosis, Current Treatment, and Prognosis:  Pt has had multiple amputations- aware of prognosis/ treatment plan  Emotional Assessment Appearance:  Appears older than stated age Attitude/Demeanor/Rapport:    Affect (typically observed):  Appropriate Orientation:  Oriented to Self, Oriented to Place, Oriented to  Time, Oriented to Situation Alcohol / Substance use:  Not Applicable Psych involvement (Current and /or in the community):  No (Comment)  Discharge Needs  Concerns to be addressed:   Care Coordination Readmission within the last 30 days:  Yes Current discharge risk:  Physical Impairment Barriers to Discharge:  Continued Medical Work up   [employer], LCSW 01/02/2015, 11:29 AM

## 2015-01-02 NOTE — Progress Notes (Addendum)
ANTICOAGULATION CONSULT NOTE - Follow Up Consult  Pharmacy Consult for Coumadin Indication: afib  No Known Allergies  Patient Measurements: Height:  (180.3 cm) Weight: 139 lb 8.8 oz (63.3 kg) IBW/kg (Calculated) : 75.3  Vital Signs: Temp: 98.3 F (36.8 C) (09/09 0607) Temp Source: Oral (09/09 0607) BP: 121/60 mmHg (09/09 0607) Pulse Rate: 81 (09/09 0607)  Labs:  Recent Labs  12/30/14 1220 12/31/14 0440  12/31/14 1606 01/01/15 0422 01/02/15 0443  HGB 9.9* 8.9*  < > 10.1* 9.4* 9.6*  HCT 31.8* 29.1*  < > 33.3* 29.7* 31.2*  PLT 97* 100*  < > 117* 122* 123*  APTT 63* 50*  --   --   --   --   LABPROT 37.1* 17.4*  --   --   --  20.7*  INR 3.88* 1.41  --   --   --  1.78*  CREATININE 3.29* 3.77*  < > 2.74*  2.66* 3.24* 2.70*  < > = values in this interval not displayed.  Estimated Creatinine Clearance: 29.3 mL/min (by C-G formula based on Cr of 2.7).  Assessment: 49 yo male from [facility] on coumadin PTA for afib, admitted with left foot gangrene. Coumadin had been on hold since 9/1 anticipating surgery, however, INR on admit was still elevated at 3.8. He received FFP and vitamin K (given per RN chart notes but not charted on [facility]). INR down to 1.41 on 9/7 and he underwent left AKA. Coumadin resumed yesterday. INR remains below goal but trending up.  Coumadin regimen at NH: typical regimen appears to be 1 mg daily x 0.5 mg on Thurs/Sat (as of 8/24, it appears that the patient was to take  x1 then /day).  Also of note, patient was on amiodarone  po bid PTA but dose has now been reduced to  po daily.   Goal of Therapy:  INR 2-3 Monitor platelets by anticoagulation protocol: Yes   Plan:  1) Coumadin  x 1 2) Daily INR  Louie Casa, PharmD, BCPS   01/02/2015 9:36 AM

## 2015-01-02 NOTE — Care Management Important Message (Signed)
Important Message  Patient Details  Name: [name] MRN: [id number] Date of Birth: 29-Oct-1965   Medicare Important Message Given:  Yes-second notification given    Kyla Balzarine 01/02/2015, 11:37 AMImportant Message  Patient Details  Name: [name] MRN: [id number] Date of Birth: 02-03-1966   Medicare Important Message Given:  Yes-second notification given    Kyla Balzarine 01/02/2015, 11:37 AM

## 2015-01-02 NOTE — Progress Notes (Signed)
[facility] Progress Note  Assessment/Plan: 1. 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 ESRD - TTS hd 3. Hypertension/volume - vol ok, possibly lower edw, on midodrine pre HD 4. Anemia - Hgb 10.8 down to 8.5 - continue ESA and course of venofer 5. Metabolic bone disease - Ca/P ok, no vit D phoslo 6. PC Malnutrition - renal carb mod/vits, supplements 7. Afib - chronic coumadin, INR high on adm now down to 1.41 - getting FFP/vit K 8. Thrombocytopenia - platelets 103 - follow   Vinson Moselle MD pager [phone]    cell [phone] 01/02/2015, 12:02 PM    Subjective:   No c/o on HD  Objective Filed Vitals:   01/01/15 1755 01/01/15 1900 01/01/15 2016 01/02/15 0607  BP: 137/73  125/53 121/60  Pulse: 81  81 81  Temp:   98.1 F (36.7 C) 98.3 F (36.8 C)  TempSrc:   Oral Oral  Resp: 20  18 18   Height:  5\' 11"  (1.803 m)    Weight: 61.5 kg (135 lb 9.3 oz)   63.3 kg (139 lb 8.8 oz)  SpO2: 98%  95% 98%   Physical Exam goal 2.5 - hoyer weight ~58.8, bed weight 67.6  EDW 66 General: up alert no distress Heart: RRR Lungs: no rales Abdomen: soft + ascites, no edema Extremities: right AKA healing, L AKA wrapped Dialysis Access: right IJ left AVF  Dialysis Orders: Center: [facility] TTS 4 hr 180 EDW 67 400/800 2 K 2.25 Ca left lower AVF *6.17.16)and right IJ NO heparin Aranesp 200 8/30 venofer 100 through 9/20 no Hectorol Recent labs: Hgb 9.9 INR 5.44 9/1 - managed by NH ocrr Ca 10.2 P 2.8 (had been in 5s) iPTH 2013 never on vit D  Additional Objective Labs: Lab Results  Component Value Date   INR 1.78* 01/02/2015   INR 1.41 12/31/2014   INR 3.88* 12/30/2014    Basic Metabolic Panel:  Recent Labs Lab 12/31/14 0440 12/31/14 0648 12/31/14 1606 01/01/15 0422 01/02/15 0443  NA 136 137 138 138 136  K 3.8 3.7 4.0 4.9 4.1  CL 95* 96* 99* 100* 96*  CO2 29 27 25 23 27   GLUCOSE 125* 120* 116* 101* 143*  BUN 39* 41* 23* 30* 22*  CREATININE  3.77* 3.83* 2.74*  2.66* 3.24* 2.70*  CALCIUM 8.3* 8.3* 8.5* 8.4* 8.4*  PHOS 5.2* 5.2* 3.9  --   --    Liver Function Tests:  Recent Labs Lab 12/30/14 1220 12/31/14 0440 12/31/14 0648 12/31/14 1606  AST 34 25 23  --   ALT 27 28 27   --   ALKPHOS 137* 120 122  --   BILITOT 0.9 0.8 0.8  --   PROT 6.1* 5.7* 5.8*  --   ALBUMIN 2.1* 2.3* 2.3* 2.4*   CBC:  Recent Labs Lab 12/31/14 0440 12/31/14 0649 12/31/14 1606 01/01/15 0422 01/02/15 0443  WBC 8.6 8.9 10.1 11.9* 10.4  HGB 8.9* 8.5* 10.1* 9.4* 9.6*  HCT 29.1* 27.7* 33.3* 29.7* 31.2*  MCV 94.8 94.5 96.0 93.4 96.3  PLT 100* 103* 117* 122* 123*   Blood Culture    Component Value Date/Time   SDES URINE, RANDOM 12/08/2014 1915   SPECREQUEST NONE 12/08/2014 1915   CULT MULTIPLE SPECIES PRESENT, SUGGEST RECOLLECTION 12/08/2014 1915   REPTSTATUS 12/10/2014 FINAL 12/08/2014 1915    CBG:  Recent Labs Lab 01/01/15 0619 01/01/15 1127 01/01/15 2218 01/02/15 0646 01/02/15 1123  GLUCAP 115* 109* 194* 149* 206*  Medications: . sodium chloride 10 mL/hr at 12/31/14 1011   . amiodarone  400 mg Oral Daily  . cefUROXime (ZINACEF)  IV  1.5 g Intravenous 30 min Pre-Op  . darbepoetin (ARANESP) injection - DIALYSIS  200 mcg Intravenous Q Wed-HD  . docusate sodium  100 mg Oral Daily  . feeding supplement  1 Container Oral TID BM  . feeding supplement (NEPRO CARB STEADY)  237 mL Oral TID BM  . feeding supplement (PRO-STAT SUGAR FREE 64)  30 mL Oral BID  . ferric gluconate (FERRLECIT/NULECIT) IV  125 mg Intravenous Q M,W,F-HD  . ferrous sulfate  325 mg Oral Q breakfast  . folic acid  1 mg Oral Daily  . gabapentin  100 mg Oral TID  . insulin aspart  0-15 Units Subcutaneous TID WC  . insulin aspart  0-5 Units Subcutaneous QHS  . midodrine  10 mg Oral Q M,W,F-HD  . multivitamin  1 tablet Oral QHS  . OxyCODONE  15 mg Oral Q12H  . pantoprazole  40 mg Oral Daily  . warfarin  1 mg Oral ONCE-1800  . Warfarin - Pharmacist Dosing  Inpatient   Does not apply [phone]

## 2015-01-02 NOTE — Progress Notes (Signed)
  [facility] Progress Note  Subjective  - POD #2  Still having a lot of pain.   Objective Filed Vitals:   01/02/15 0607  BP: 121/60  Pulse: 81  Temp: 98.3 F (36.8 C)  Resp: 18    Intake/Output Summary (Last 24 hours) at 01/02/15 0858 Last data filed at 01/01/15 2113  Gross per 24 hour  Intake      0 ml  Output   1600 ml  Net  -1600 ml    Left AKA incision clean and intact. Some mild sanguinous drainage on dressing. No active bleeding.  Right AKA edematous. Incisions clean and intact.   Assessment/Planning: 49 y.o. male is s/p: left AKA 2 Days Post-Op   Stump is viable.  Pain still poorly controlled. Increased oxycodone 5 mg to 1-2 tablets q 4 prn.  To SNF when pain better controlled. Expect d/c next 1-2 days.   Raymond Gurney 01/02/2015 8:58 AM --  Laboratory CBC    Component Value Date/Time   WBC 10.4 01/02/2015 0443   HGB 9.6* 01/02/2015 0443   HCT 31.2* 01/02/2015 0443   PLT 123* 01/02/2015 0443    BMET    Component Value Date/Time   NA 136 01/02/2015 0443   K 4.1 01/02/2015 0443   CL 96* 01/02/2015 0443   CO2 27 01/02/2015 0443   GLUCOSE 143* 01/02/2015 0443   BUN 22* 01/02/2015 0443   CREATININE 2.70* 01/02/2015 0443   CALCIUM 8.4* 01/02/2015 0443   GFRNONAA 26* 01/02/2015 0443   GFRAA 30* 01/02/2015 0443    COAG Lab Results  Component Value Date   INR 1.78* 01/02/2015   INR 1.41 12/31/2014   INR 3.88* 12/30/2014   No results found for: PTT  Antibiotics Anti-infectives    Start     Dose/Rate Route Frequency Ordered Stop   12/31/14 2000  cefUROXime (ZINACEF) 1.5 g in dextrose 5 % 50 mL IVPB     1.5 g 100 mL/hr over 30 Minutes Intravenous Every 12 hours 12/31/14 1515 01/01/15 0847   12/31/14 1030  [MAR Hold]  cefUROXime (ZINACEF) 1.5 g in dextrose 5 % 50 mL IVPB     (MAR Hold since 12/31/14 0958)   1.5 g 100 mL/hr over 30 Minutes Intravenous To Short Stay 12/30/14 1342 12/31/14 1320   12/30/14 1330  cefUROXime  (ZINACEF) 1.5 g in dextrose 5 % 50 mL IVPB     1.5 g 100 mL/hr over 30 Minutes Intravenous 30 min pre-op 12/30/14 1154         Maris Berger, PA-C [facility] Office: [phone] Pager: [phone] 01/02/2015 8:58 AM

## 2015-01-03 DIAGNOSIS — I739 Peripheral vascular disease, unspecified: Secondary | ICD-10-CM

## 2015-01-03 LAB — GLUCOSE, CAPILLARY: Glucose-Capillary: 97 mg/dL (ref 65–99)

## 2015-01-03 LAB — PROTIME-INR
INR: 2.35 — ABNORMAL HIGH (ref 0.00–1.49)
Prothrombin Time: 25.4 seconds — ABNORMAL HIGH (ref 11.6–15.2)

## 2015-01-03 MED ORDER — GABAPENTIN 100 MG PO CAPS: 100.0000 mg | ORAL_CAPSULE | Freq: Three times a day (TID) | ORAL | Status: DC

## 2015-01-03 MED ORDER — WARFARIN SODIUM 1 MG PO TABS: ORAL_TABLET | ORAL | Status: DC

## 2015-01-03 MED ORDER — BOOST / RESOURCE BREEZE PO LIQD: 1.0000 | Freq: Three times a day (TID) | ORAL | Status: DC

## 2015-01-03 MED ORDER — OXYCODONE HCL ER 15 MG PO T12A: 15.0000 mg | EXTENDED_RELEASE_TABLET | Freq: Three times a day (TID) | ORAL | Status: DC

## 2015-01-03 MED ORDER — OXYCODONE HCL 5 MG PO TABS
ORAL_TABLET | ORAL | Status: AC
  Filled 2015-01-03: qty 1

## 2015-01-03 MED ORDER — OXYCODONE HCL 5 MG PO TABS: 5.0000 mg | ORAL_TABLET | ORAL | Status: DC | PRN

## 2015-01-03 MED ORDER — DOCUSATE SODIUM 100 MG PO CAPS: 100.0000 mg | ORAL_CAPSULE | Freq: Two times a day (BID) | ORAL | Status: AC

## 2015-01-03 MED ORDER — OXYCODONE HCL ER 15 MG PO T12A
15.0000 mg | EXTENDED_RELEASE_TABLET | Freq: Three times a day (TID) | ORAL | Status: DC
Start: 2015-01-03 — End: 2015-03-10

## 2015-01-03 MED ORDER — NEPRO/CARBSTEADY PO LIQD: 237.0000 mL | Freq: Three times a day (TID) | ORAL | Status: DC

## 2015-01-03 MED ORDER — PRO-STAT SUGAR FREE PO LIQD: 30.0000 mL | Freq: Three times a day (TID) | ORAL | Status: AC

## 2015-01-03 MED ORDER — AMIODARONE HCL 400 MG PO TABS: 400.0000 mg | ORAL_TABLET | Freq: Every day | ORAL | Status: AC

## 2015-01-03 NOTE — Progress Notes (Addendum)
CSW (Clinical Child psychotherapist) prepared pt dc packet and placed with shadow chart. CSW will arrange non-emergent ambulance transport when pt back from dialysis. Pt family, pt nurse, and facility informed. CSW signing off.  ADDENDUM: CSW arranged pt transport and notified pt that family will need to pick up his wheelchair. Pt asked CSW to call his sister. CSW spoke with pt sister and she confirmed she will find a way to pick up pt wheelchair as soon as she can today.  Porschia Willbanks Lajean Saver Weekend CSW [phone]

## 2015-01-03 NOTE — Progress Notes (Signed)
   Daily Progress Note  Assessment/Planning: POD #3 s/p L AKA   Viable stump  ABD pad with retention sock to L AKA  Follow up in office in 4 weeks for staple removal  Subjective  - 3 Days Post-Op  C/o surgical pain  Objective Filed Vitals:   01/02/15 0607 01/02/15 1408 01/02/15 1936 01/03/15 0410  BP: 121/60 110/51 125/61 123/57  Pulse: 81 78 81 72  Temp: 98.3 F (36.8 C) 98.4 F (36.9 C) 97.8 F (36.6 C) 98.5 F (36.9 C)  TempSrc: Oral Oral Oral Oral  Resp: Height:      Weight: 139 lb 8.8 oz (63.3 kg)     SpO2: 98% 100% 99% 100%    Intake/Output Summary (Last 24 hours) at 01/03/15 0845 Last data filed at 01/03/15 1610  Gross per 24 hour  Intake   1080 ml  Output      0 ml  Net   1080 ml    VASC  Viable L AKA stump, staples in place  Laboratory CBC    Component Value Date/Time   WBC 10.4 01/02/2015 0443   HGB 9.6* 01/02/2015 0443   HCT 31.2* 01/02/2015 0443   PLT 123* 01/02/2015 0443    BMET    Component Value Date/Time   NA 136 01/02/2015 0443   K 4.1 01/02/2015 0443   CL 96* 01/02/2015 0443   CO2 27 01/02/2015 0443   GLUCOSE 143* 01/02/2015 0443   BUN 22* 01/02/2015 0443   CREATININE 2.70* 01/02/2015 0443   CALCIUM 8.4* 01/02/2015 0443   GFRNONAA 26* 01/02/2015 0443   GFRAA 30* 01/02/2015 0443    Leonides Sake, MD [facility] [place] Office: [phone] Pager: [phone]  01/03/2015, 8:45 AM

## 2015-01-03 NOTE — Progress Notes (Signed)
Pt discharged back to SNF.  Alert and oriented x4.  No c/o pain.  Education given on diet activity, meds and follow-up care and appointments.  Pt verbalized understanding.  IV D/Cd.  Tele D/Cd.  Pt transported by ambulance.

## 2015-01-03 NOTE — Discharge Summary (Signed)
Physician Discharge Summary  Jacory Kamel ZOX:[id number] DOB: 02/04/1966 DOA: 12/30/2014  PCP: [name], NP  Admit date: 12/30/2014 Discharge date: 01/03/2015  Time spent: >30 minutes  Recommendations for Outpatient Follow-up:  1. Might need outpatient chronic pain management follow up 2. Repeat CBC in 3 days to follow Hgb trend 3. Close follow up of INR to make sure he remains in therapeutic range  Discharge Diagnoses:  Principal Problem:   Gangrene of foot/LEFT Active Problems:   CAD (coronary artery disease)   Thrombocytopenia-chronic   Gastroesophageal reflux disease with esophagitis   Peripheral artery disease   HLD (hyperlipidemia)   Chronic combined systolic and diastolic CHF (congestive heart failure)   Diabetes type 2, controlled   Stage 3 skin ulcer of sacral region   Protein-calorie malnutrition   CKD (chronic kidney disease) stage V requiring chronic dialysis   Anemia   Supratherapeutic INR   Atrial fibrillation on Coumadin   History of ventricular tachycardia   Discharge Condition: stable for discharge to SNF. Patient will follow with vascular surgery in approx 4 weeks for staple removal. Continue pain medications adjustments as needed for pain management.  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 During that hospitalization he underwent arteriogram that revealed no percutaneous revascularization options available. Patient refused left AKA and was discharged to a skilled nursing facility. He was seen  post discharge at the vascular surgery office on 9/1 and he agreed to proceed with elective above-the-knee amputation on the left. In addition patient has a history of atrial fibrillation on Coumadin and on several occasions that has had issues with coagulopathy requiring reversal agents. 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  Patient presented to [facility] today with plans to proceed with elective AKA as described above. Unfortunately INR 3.8 despite being off Coumadin for 4 days. [facility] has ordered 4 units of FFP as well as vitamin K and plans are to proceed with surgical intervention once INR less than or equal to 1.7.  Hospital Course:  Gangrene of foot/LEFT/Peripheral artery disease -Further post operative management and wound care per vascular team -s/p left AKA on 9/7  -continue PRN analgesics and supportive care. 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 -On amiodarone as recommended by EP  Thrombocytopenia and anemia: due to chronic disease  -Hemoglobin 9.6;  will follow trend -if needed could be  transfuse with HD -Platelets at 123; no overt bleeding, will monitor -continue aranesp and IV iron as per renal discretion   Chronic combined systolic and diastolic CHF  -Currently compensated  -volume to continue to be manage with dialysis  Diabetes type 2, controlled -CBG's well controlled for inpatient -A1C 5.3 -continue SSI  Gastroesophageal reflux disease with esophagitis -continue PPI  Stage 3 skin ulcer of sacral region -Has been using Gelfoam mattress at nursing home -Air mattress while here -will continue prevention measures -wound care service will be consulted if needed  Protein-calorie malnutrition -Albumin 2.1 -Continue protein supplementation -will follow Nutritional service rec's and will continue breeze, nepro TID and prostat   Procedures: Left AKA 12/31/14  Consultations:  Vascular surgery  Renal service   Discharge Exam: Filed Vitals:   01/03/15 1000  BP: 97/43  Pulse: 60  Temp:   Resp:     General: Afebrile. Reports pain continue to be significant; but somewhat improved; underweight chronically ill in appearance and frail; no CP or SOB  Cardiovascular: rate controlled, no rubs or gallops  Respiratory: no wheezing, no crackles  Abdomen: soft, NT, ND, positive BS  Musculoskeletal: right AKA healing wounds, Left AKA with dry clean dressings in place.  Discharge Instructions   Discharge Instructions    Diet - low sodium heart healthy    Complete by:  As directed      Discharge instructions    Complete by:  As directed   Encourage nutrition and make sure patient received protein supplements Follow up with vascular surgery in 4 weeks Wound care as instructed by vascular surgery (contact Dr. Gearldine Shown if further info/instructions needed) Repeat CBC in 3 days to follow Hgb trend Patient will continue requiring further adjustment for pain medications; has had chronic pain for long period of time now; just watch closely side effects and  low BP          Current Discharge Medication List    START taking these medications   Details  docusate sodium (COLACE) 100 MG capsule Take 1 capsule (100 mg total) by mouth 2 (two) times daily. Hold for diarrhea    gabapentin (NEURONTIN) 100 MG capsule Take 1 capsule (100 mg total) by mouth 3 (three) times daily.    warfarin (COUMADIN) 1 MG tablet Take 1mg  daily except for Thursday and Saturday where patient will take 0.5mg       CONTINUE these medications which have CHANGED   Details  Amino Acids-Protein Hydrolys (FEEDING SUPPLEMENT, PRO-STAT SUGAR FREE 64,) LIQD Take 30 mLs by mouth 3 (three) times daily with meals. Ok to mixed it out with juice or any other drinks to facilitate intake Qty: 900 mL, Refills: 0    amiodarone (PACERONE) 400 MG tablet Take 1 tablet (400 mg total) by mouth daily.    !! feeding supplement (BOOST / RESOURCE BREEZE) LIQD Take 1 Container by mouth 3 (three) times daily between meals. Refills: 0    !! Nutritional Supplements (FEEDING SUPPLEMENT, NEPRO CARB STEADY,) LIQD Take 237 mLs by mouth 3 (three) times daily between meals.    oxyCODONE (OXY IR/ROXICODONE) 5 MG immediate release tablet Take 1-2 tablets (5-10 mg total) by mouth every 4 (four) hours as needed for breakthrough pain. Qty: 30 tablet, Refills: 0    OxyCODONE (OXYCONTIN) 15 mg T12A 12 hr tablet Take 1 tablet (15 mg total) by mouth every 8 (eight) hours. Qty: 30 tablet, Refills: 0     !! -  Potential duplicate medications found. Please discuss with provider.    CONTINUE these medications which have NOT CHANGED   Details  calcium acetate (PHOSLO) 667 MG capsule Take 2 capsules (1,334 mg total) by mouth 3 (three) times daily with meals. Qty: 90 capsule, Refills: 0    Darbepoetin Alfa (ARANESP) 100 MCG/0.5ML SOSY injection Inject 0.5 mLs (100 mcg total) into the vein every Tuesday with hemodialysis. Qty: 4.2 mL, Refills: 0    Ferrous Sulfate (IRON) 325 (65 FE) MG TABS Take 325 mg by mouth  daily.    folic acid (FOLVITE) 1 MG tablet Take 1 tablet (1 mg total) by mouth daily. Qty: 30 tablet, Refills: 0    insulin aspart (NOVOLOG) 100 UNIT/ML injection Inject 0-9 Units into the skin 3 (three) times daily with meals. Before each meal 3 times a day, 140-199 - 2 units, 200-250 - 4 units, 251-299 - 6 units,  300-349 - 8 units,  350 or above 10 units. Insulin PEN if approved, provide syringes and needles if needed. Qty: 10 mL, Refills: 11    Melatonin 3 MG TABS Take 3 mg by mouth at bedtime.    midodrine (PROAMATINE) 10 MG tablet Take 10 mg by mouth See admin instructions. Only on Tuesday, Thursday, Saturday with Hemodialysyis    Multiple Vitamins-Minerals (DECUBI-VITE PO) Take 1 tablet by mouth daily.    nitroGLYCERIN (NITROSTAT) 0.4 MG SL tablet Place 1 tablet (0.4 mg total) under the tongue once. Qty: 25 tablet, Refills: 5    pantoprazole (PROTONIX) 40 MG tablet Take 40 mg by mouth daily.    polyethylene glycol (MIRALAX / GLYCOLAX) packet Take 17 g by mouth daily as needed for moderate constipation. Qty: 14 each, Refills: 0      STOP taking these medications     senna-docusate (SENOKOT-S) 8.6-50 MG per tablet        No Known Allergies Follow-up Information    Follow up with Fabienne Bruns, MD In 4 weeks.   Specialties:  Vascular Surgery, Cardiology   Why:  Office will call you to arrange your appt (sent)   Contact information:   [address] [place] [address] [phone]       Follow up with [name], NP. Schedule an appointment as soon as possible for a visit in 2 weeks.   Contact information:   [address] [place] [address] [phone]       The results of significant diagnostics from this hospitalization (including imaging, microbiology, ancillary and laboratory) are listed below for reference.    Significant Diagnostic Studies: Dg Chest 2 View  12/08/2014   CLINICAL DATA:  Preop evaluation.  Sepsis with cellulitis both legs.  EXAM:  CHEST  2 VIEW  COMPARISON:  10/17/2014  FINDINGS: Sternotomy wires and right IJ central venous catheter unchanged. Lungs are hypoinflated without focal consolidation or effusion. Mild stable cardiomegaly. Remainder of the exam is unchanged.  IMPRESSION: Hypoinflation without acute cardiopulmonary disease.   Electronically Signed   By: Elberta Fortis M.D.   On: 12/08/2014 14:49   Labs: Basic Metabolic Panel:  Recent Labs Lab 12/31/14 0440 12/31/14 0648 12/31/14 1606 01/01/15 0422 01/02/15 0443  NA 136 137 138 138 136  K 3.8 3.7 4.0 4.9 4.1  CL 95* 96* 99* 100* 96*  CO2 29 27 25 23 27   GLUCOSE 125* 120* 116* 101* 143*  BUN 39* 41* 23* 30* 22*  CREATININE 3.77* 3.83* 2.74*  2.66* 3.24* 2.70*  CALCIUM 8.3* 8.3* 8.5* 8.4* 8.4*  PHOS 5.2* 5.2* 3.9  --   --    Liver Function Tests:  Recent Labs Lab 12/30/14 1220 12/31/14 0440 12/31/14 0648 12/31/14 1606  AST 34 25 23  --   ALT --   ALKPHOS 137* 120 122  --   BILITOT 0.9 0.8 0.8  --   PROT 6.1* 5.7* 5.8*  --   ALBUMIN 2.1* 2.3* 2.3* 2.4*   CBC:  Recent Labs Lab 12/31/14 0440 12/31/14 0649 12/31/14 1606 01/01/15 0422 01/02/15 0443  WBC 8.6 8.9 10.1 11.9* 10.4  HGB 8.9* 8.5* 10.1* 9.4* 9.6*  HCT 29.1* 27.7* 33.3* 29.7* 31.2*  MCV 94.8 94.5 96.0 93.4 96.3  PLT 100* 103* 117* 122* 123*   BNP (last 3 results)  Recent Labs  09/25/14 0410  BNP 2227.0*   CBG:  Recent Labs Lab 01/02/15 0646 01/02/15 1123 01/02/15 1627 01/02/15 2108 01/03/15 0557  GLUCAP 149* 206* 116* 169* 97    Signed:  Vassie Loll  [facility] 01/03/2015, 10:55 AM

## 2015-01-03 NOTE — Progress Notes (Signed)
  Progress Note    01/03/2015 8:43 AM 3 Days Post-Op  Subjective:  C/o pain-mostly in buttocks  Afebrile VSS   Filed Vitals:   01/03/15 0410  BP: 123/57  Pulse: 72  Temp: 98.5 F (36.9 C)  Resp: 17    Physical Exam: Incisions:  Left stump c/d/i with staples in tact   CBC    Component Value Date/Time   WBC 10.4 01/02/2015 0443   RBC 3.24* 01/02/2015 0443   HGB 9.6* 01/02/2015 0443   HCT 31.2* 01/02/2015 0443   PLT 123* 01/02/2015 0443   MCV 96.3 01/02/2015 0443   MCH 29.6 01/02/2015 0443   MCHC 30.8 01/02/2015 0443   RDW 18.6* 01/02/2015 0443   LYMPHSABS 0.4* 12/08/2014 1338   MONOABS 1.0 12/08/2014 1338   EOSABS 0.0 12/08/2014 1338   BASOSABS 0.0 12/08/2014 1338    BMET    Component Value Date/Time   NA 136 01/02/2015 0443   K 4.1 01/02/2015 0443   CL 96* 01/02/2015 0443   CO2 27 01/02/2015 0443   GLUCOSE 143* 01/02/2015 0443   BUN 22* 01/02/2015 0443   CREATININE 2.70* 01/02/2015 0443   CALCIUM 8.4* 01/02/2015 0443   GFRNONAA 26* 01/02/2015 0443   GFRAA 30* 01/02/2015 0443    INR    Component Value Date/Time   INR 2.35* 01/03/2015 0306     Intake/Output Summary (Last 24 hours) at 01/03/15 0843 Last data filed at 01/03/15 4098  Gross per 24 hour  Intake   1080 ml  Output      0 ml  Net   1080 ml     Assessment/Plan:  49 y.o. male is s/p left above knee amputation   3 Days Post-Op  -pt's left stump is viable-clean and dry with staples in tact -retention sock ordered from [employer] (needs ABD and retention sock to eliminate dressing change) -HD per renal -coumadin per [facility] for afib -f/u with Dr. Darrick Penna in 4 weeks (office has been notified)    Doreatha Massed, PA-C [facility] [phone] 01/03/2015 8:43 AM

## 2015-01-03 NOTE — Progress Notes (Signed)
[facility] Progress Note  Assessment: 1. 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 ESRD -TTS hd 3. Hypertension/volume - vol ok, possibly lower edw, on midodrine pre HD 4. Anemia - Hgb 10.8 down to 8.5 - continue ESA and course of venofer 5. Metabolic bone disease - Ca/P ok, no vit D phoslo 6. PC Malnutrition - renal carb mod/vits, supplements 7. Afib - chronic coumadin, INR high on adm now down to 1.41 - getting FFP/vit K 8. Thrombocytopenia - platelets 103 - follow  Plan - HD today, ok for dc from renal standpoint  Vinson Moselle MD pager [phone]    cell [phone] 01/03/2015, 10:47 AM    Subjective:   No c/o on HD  Objective Filed Vitals:   01/03/15 0900 01/03/15 0905 01/03/15 0930 01/03/15 1000  BP: 95/49 95/57 119/50 97/43  Pulse: 73 73 83 60  Temp:      TempSrc:      Resp: 13 13    Height:      Weight:      SpO2:       Physical Exam goal 2.5 - hoyer weight ~58.8, bed weight 67.6  EDW 66 General: up alert no distress Heart: RRR Lungs: no rales Abdomen: soft + ascites, no edema Extremities: right AKA healing, L AKA wrapped Dialysis Access: right IJ left AVF  Dialysis Orders: Center: [facility] TTS 4 hr 180 EDW 67 400/800 2 K 2.25 Ca left lower AVF *6.17.16)and right IJ NO heparin Aranesp 200 8/30 venofer 100 through 9/20 no Hectorol Recent labs: Hgb 9.9 INR 5.44 9/1 - managed by NH ocrr Ca 10.2 P 2.8 (had been in 5s) iPTH 2013 never on vit D  Additional Objective Labs: Lab Results  Component Value Date   INR 2.35* 01/03/2015   INR 1.78* 01/02/2015   INR 1.41 12/31/2014    Basic Metabolic Panel:  Recent Labs Lab 12/31/14 0440 12/31/14 0648 12/31/14 1606 01/01/15 0422 01/02/15 0443  NA 136 137 138 138 136  K 3.8 3.7 4.0 4.9 4.1  CL 95* 96* 99* 100* 96*  CO2 GLUCOSE 125* 120* 116* 101* 143*  BUN 39* 41* 23* 30* 22*  CREATININE 3.77* 3.83* 2.74*  2.66* 3.24* 2.70*  CALCIUM 8.3* 8.3* 8.5* 8.4*  8.4*  PHOS 5.2* 5.2* 3.9  --   --    Liver Function Tests:  Recent Labs Lab 12/30/14 1220 12/31/14 0440 12/31/14 0648 12/31/14 1606  AST 34 25 23  --   ALT --   ALKPHOS 137* 120 122  --   BILITOT 0.9 0.8 0.8  --   PROT 6.1* 5.7* 5.8*  --   ALBUMIN 2.1* 2.3* 2.3* 2.4*   CBC:  Recent Labs Lab 12/31/14 0440 12/31/14 0649 12/31/14 1606 01/01/15 0422 01/02/15 0443  WBC 8.6 8.9 10.1 11.9* 10.4  HGB 8.9* 8.5* 10.1* 9.4* 9.6*  HCT 29.1* 27.7* 33.3* 29.7* 31.2*  MCV 94.8 94.5 96.0 93.4 96.3  PLT 100* 103* 117* 122* 123*   Blood Culture    Component Value Date/Time   SDES URINE, RANDOM 12/08/2014 1915   SPECREQUEST NONE 12/08/2014 1915   CULT MULTIPLE SPECIES PRESENT, SUGGEST RECOLLECTION 12/08/2014 1915   REPTSTATUS 12/10/2014 FINAL 12/08/2014 1915    CBG:  Recent Labs Lab 01/02/15 0646 01/02/15 1123 01/02/15 1627 01/02/15 2108 01/03/15 0557  GLUCAP 149* 206* 116* 169* 97   Medications: . sodium chloride 10 mL/hr at 12/31/14  1011   . amiodarone  400 mg Oral Daily  . cefUROXime (ZINACEF)  IV  1.5 g Intravenous 30 min Pre-Op  . darbepoetin (ARANESP) injection - DIALYSIS  200 mcg Intravenous Q Wed-HD  . docusate sodium  100 mg Oral Daily  . feeding supplement  1 Container Oral TID BM  . feeding supplement (NEPRO CARB STEADY)  237 mL Oral TID BM  . feeding supplement (PRO-STAT SUGAR FREE 64)  30 mL Oral BID  . ferric gluconate (FERRLECIT/NULECIT) IV  125 mg Intravenous Q M,W,F-HD  . ferrous sulfate  325 mg Oral Q breakfast  . folic acid  1 mg Oral Daily  . gabapentin  100 mg Oral TID  . insulin aspart  0-15 Units Subcutaneous TID WC  . insulin aspart  0-5 Units Subcutaneous QHS  . midodrine  10 mg Oral Q M,W,F-HD  . multivitamin  1 tablet Oral QHS  . OxyCODONE  15 mg Oral 3 times per day  . pantoprazole  40 mg Oral Daily  . Warfarin - Pharmacist Dosing Inpatient   Does not apply [phone]

## 2015-01-05 ENCOUNTER — Non-Acute Institutional Stay (SKILLED_NURSING_FACILITY): Payer: Medicare Other | Admitting: Adult Health

## 2015-01-05 ENCOUNTER — Encounter: Payer: Self-pay | Admitting: Adult Health

## 2015-01-05 DIAGNOSIS — I96 Gangrene, not elsewhere classified: Secondary | ICD-10-CM

## 2015-01-05 DIAGNOSIS — K219 Gastro-esophageal reflux disease without esophagitis: Secondary | ICD-10-CM

## 2015-01-05 DIAGNOSIS — E43 Unspecified severe protein-calorie malnutrition: Secondary | ICD-10-CM | POA: Diagnosis not present

## 2015-01-05 DIAGNOSIS — Z992 Dependence on renal dialysis: Secondary | ICD-10-CM

## 2015-01-05 DIAGNOSIS — E119 Type 2 diabetes mellitus without complications: Secondary | ICD-10-CM

## 2015-01-05 DIAGNOSIS — I480 Paroxysmal atrial fibrillation: Secondary | ICD-10-CM | POA: Diagnosis not present

## 2015-01-05 DIAGNOSIS — N186 End stage renal disease: Secondary | ICD-10-CM

## 2015-01-05 DIAGNOSIS — I5042 Chronic combined systolic (congestive) and diastolic (congestive) heart failure: Secondary | ICD-10-CM

## 2015-01-05 DIAGNOSIS — R5381 Other malaise: Secondary | ICD-10-CM

## 2015-01-05 DIAGNOSIS — I25119 Atherosclerotic heart disease of native coronary artery with unspecified angina pectoris: Secondary | ICD-10-CM | POA: Diagnosis not present

## 2015-01-05 DIAGNOSIS — D631 Anemia in chronic kidney disease: Secondary | ICD-10-CM

## 2015-01-05 DIAGNOSIS — N189 Chronic kidney disease, unspecified: Secondary | ICD-10-CM

## 2015-01-05 DIAGNOSIS — D696 Thrombocytopenia, unspecified: Secondary | ICD-10-CM

## 2015-01-05 DIAGNOSIS — K5900 Constipation, unspecified: Secondary | ICD-10-CM

## 2015-01-05 DIAGNOSIS — L98429 Non-pressure chronic ulcer of back with unspecified severity: Secondary | ICD-10-CM

## 2015-01-05 DIAGNOSIS — L89153 Pressure ulcer of sacral region, stage 3: Secondary | ICD-10-CM | POA: Diagnosis not present

## 2015-01-05 DIAGNOSIS — I953 Hypotension of hemodialysis: Secondary | ICD-10-CM

## 2015-01-05 DIAGNOSIS — G4700 Insomnia, unspecified: Secondary | ICD-10-CM

## 2015-01-06 ENCOUNTER — Telehealth: Payer: Self-pay | Admitting: Vascular Surgery

## 2015-01-06 NOTE — Telephone Encounter (Signed)
-----   Message from Sharee Pimple, RN sent at 01/05/2015  9:23 AM EDT ----- Regarding: Schedule   ----- Message -----    From: Fransisco Hertz, MD    Sent: 01/03/2015   8:46 AM      To: [facility] Charge Pool  [name] [id number] 06-21-1965  Follow with DR. Early in 4 weeks for staple removal from L AKA

## 2015-01-06 NOTE — Telephone Encounter (Signed)
Lm for [name] at [facility]

## 2015-01-07 ENCOUNTER — Non-Acute Institutional Stay (SKILLED_NURSING_FACILITY): Payer: Medicare Other | Admitting: Internal Medicine

## 2015-01-07 DIAGNOSIS — Z992 Dependence on renal dialysis: Secondary | ICD-10-CM

## 2015-01-07 DIAGNOSIS — D62 Acute posthemorrhagic anemia: Secondary | ICD-10-CM | POA: Diagnosis not present

## 2015-01-07 DIAGNOSIS — I5042 Chronic combined systolic (congestive) and diastolic (congestive) heart failure: Secondary | ICD-10-CM

## 2015-01-07 DIAGNOSIS — R5381 Other malaise: Secondary | ICD-10-CM | POA: Diagnosis not present

## 2015-01-07 DIAGNOSIS — L98429 Non-pressure chronic ulcer of back with unspecified severity: Secondary | ICD-10-CM

## 2015-01-07 DIAGNOSIS — E46 Unspecified protein-calorie malnutrition: Secondary | ICD-10-CM | POA: Diagnosis not present

## 2015-01-07 DIAGNOSIS — N186 End stage renal disease: Secondary | ICD-10-CM

## 2015-01-07 DIAGNOSIS — K2100 Gastro-esophageal reflux disease with esophagitis, without bleeding: Secondary | ICD-10-CM

## 2015-01-07 DIAGNOSIS — I739 Peripheral vascular disease, unspecified: Secondary | ICD-10-CM | POA: Diagnosis not present

## 2015-01-07 DIAGNOSIS — L89153 Pressure ulcer of sacral region, stage 3: Secondary | ICD-10-CM | POA: Diagnosis not present

## 2015-01-07 DIAGNOSIS — E119 Type 2 diabetes mellitus without complications: Secondary | ICD-10-CM | POA: Diagnosis not present

## 2015-01-07 DIAGNOSIS — I482 Chronic atrial fibrillation, unspecified: Secondary | ICD-10-CM

## 2015-01-07 DIAGNOSIS — I953 Hypotension of hemodialysis: Secondary | ICD-10-CM | POA: Diagnosis not present

## 2015-01-07 NOTE — Progress Notes (Signed)
Patient ID: [name], male   DOB: [birth year], 49 y.o.   MRN: [id number]    DATE:  01/05/15 MRN:  [id number]  BIRTHDAY: May 10, 1965  Facility:  Nursing Home Location:  [facility]  Nursing Home Room Number: [id number]  LEVEL OF CARE:  SNF (31)  Contact Information    Name Relation Home Work Mobile   Harper,Deloris Mother [phone]     Price,Sherri Sister   [phone]      Chief Complaint  Patient presents with  . Hospitalization Follow-up    Physical deconditioning, gangrene of left foot S/P left AKA, ESRD, atrial fibrillation, CAD, anemia, thrombocytopenia, chronic combined systolic and diastolic CHF, diabetes mellitus, GERD, stage III skin ulcer of sacral region, protein calorie malnutrition, constipation, insomnia and hypotension    HISTORY OF PRESENT ILLNESS:  This is a 49 year old male who has been admitted to [facility] on 01/03/15 from [facility]. He has PMH of PVD S/P right AKA 6/16 due to nonhealing ulcers, chronic gangrenous left foot and was subsequently discharged on 8/22. He had arteriogram that revealed no percutaneous revascularization options available. He refused AKA and was discharged to [facility]. He had a follow-up appointment on 12/25/14 and agreed to have left AKA. He also has A. fib, CKD on dialysis (T-Th-Sat), combined systolic and diastolic heart failure managed with dialysis, ischemic cardiomyopathy, diabetes, anemia, chronic thrombocytopenia, GERD with esophagitis, severe protein cholerae malnutrition with stage III sacral decubitus and dyslipidemia.    He has been admitted for a short-term rehabilitation.  PAST MEDICAL HISTORY:  Past Medical History  Diagnosis Date  . Coronary artery disease 07/05/2011    2D ECHO - EF ~40%, moderate concentric LV hypertrophy, LA moderately dilated, mild-moderate septal and inferior wall hypokinesis  . Hypertension   . CHF (congestive heart failure)   . Diabetes mellitus     Insulin dependent   . Cardiomyopathy   . Claudication 05/09/2011    Right and left anterior tibial arteries and Left SFA-occluded; Right CIA-<50% diameter reduction; Right Deep Profunda-70-99% diameter reduction; Right SFA->60% diameter reduction; Right Distal Popliteal/Tibial Artery- >60% diameter reduction; Left CFA and Profunda- >50% diameter reduction  . 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 GERD (gastroesophageal reflux disease)   . Peripheral vascular disease   . 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 Chronic kidney disease (CKD), stage IV (severe)     Dialysis - t/th/Sa     CURRENT MEDICATIONS: Reviewed  Patient's Medications  New Prescriptions   No medications on file  Previous Medications   AMINO ACIDS-PROTEIN HYDROLYS (FEEDING SUPPLEMENT, PRO-STAT SUGAR FREE 64,) LIQD    Take 30 mLs by mouth 3 (three) times daily with meals. Ok to mixed it out with juice or any other drinks to facilitate intake   AMIODARONE (PACERONE) 400 MG TABLET    Take 1 tablet (400 mg total) by mouth daily.   CALCIUM ACETATE (PHOSLO) 667 MG CAPSULE    Take 2 capsules (1,334 mg total) by mouth 3 (three) times daily with meals.   DARBEPOETIN ALFA (ARANESP) 100 MCG/0.5ML SOSY INJECTION    Inject 0.5 mLs (100 mcg total) into the vein every Tuesday with hemodialysis.   DOCUSATE SODIUM (COLACE) 100 MG CAPSULE    Take 1 capsule (100 mg total) by  mouth 2 (two) times daily. Hold for diarrhea   FEEDING SUPPLEMENT (BOOST / RESOURCE BREEZE) LIQD    Take 1 Container by mouth 3 (three) times daily between meals.   FERROUS SULFATE (IRON) 325 (65 FE) MG TABS    Take 325 mg by mouth daily.   FOLIC ACID (FOLVITE) 1 MG TABLET    Take 1 tablet (1 mg total) by mouth daily.   GABAPENTIN (NEURONTIN) 100 MG CAPSULE    Take 1 capsule  (100 mg total) by mouth 3 (three) times daily.   INSULIN ASPART (NOVOLOG) 100 UNIT/ML INJECTION    Inject 0-9 Units into the skin 3 (three) times daily with meals. Before each meal 3 times a day, 140-199 - 2 units, 200-250 - 4 units, 251-299 - 6 units,  300-349 - 8 units,  350 or above 10 units. Insulin PEN if approved, provide syringes and needles if needed.   MELATONIN 3 MG TABS    Take 3 mg by mouth at bedtime.   MIDODRINE (PROAMATINE) 10 MG TABLET    Take 10 mg by mouth See admin instructions. Only on Tuesday, Thursday, Saturday with Hemodialysyis   MULTIPLE VITAMINS-MINERALS (DECUBI-VITE PO)    Take 1 tablet by mouth daily.   NITROGLYCERIN (NITROSTAT) 0.4 MG SL TABLET    Place 1 tablet (0.4 mg total) under the tongue once.   NUTRITIONAL SUPPLEMENTS (FEEDING SUPPLEMENT, NEPRO CARB STEADY,) LIQD    Take 237 mLs by mouth 3 (three) times daily between meals.   OXYCODONE (OXY IR/ROXICODONE) 5 MG IMMEDIATE RELEASE TABLET    Take 1-2 tablets (5-10 mg total) by mouth every 4 (four) hours as needed for breakthrough pain.   OXYCODONE (OXYCONTIN) 15 MG T12A 12 HR TABLET    Take 1 tablet (15 mg total) by mouth every 8 (eight) hours.   PANTOPRAZOLE (PROTONIX) 40 MG TABLET    Take 40 mg by mouth daily.   POLYETHYLENE GLYCOL (MIRALAX / GLYCOLAX) PACKET    Take 17 g by mouth daily as needed for moderate constipation.   WARFARIN (COUMADIN) 1 MG TABLET    Take 1mg  daily except for Thursday and Saturday where patient will take 0.5mg   Modified Medications   No medications on file  Discontinued Medications   No medications on file     No Known Allergies   REVIEW OF SYSTEMS:  GENERAL:   no fatigue, no weight changes, no fever, chills or weakness EYES: Denies change in vision, dry eyes, eye pain, itching or discharge EARS: Denies change in hearing, ringing in ears, or earache NOSE: Denies nasal congestion or epistaxis MOUTH and THROAT: Denies oral discomfort, gingival pain or bleeding, pain from teeth or  hoarseness   RESPIRATORY: no cough, SOB, DOE, wheezing, hemoptysis CARDIAC: no chest pain, edema or palpitations GI: no abdominal pain, diarrhea, constipation, heart burn, nausea or vomiting GU: Denies dysuria, frequency, hematuria, incontinence, or discharge PSYCHIATRIC: Denies feeling of depression or anxiety. No report of hallucinations, insomnia, paranoia, or agitation   PHYSICAL EXAMINATION  GENERAL APPEARANCE: In no acute distress. SKIN:  Right AKA stump with dry dressing, sacrum pressure ulcer stage 3, Left AKA stump covered with dressing, wound with dry dressing on left upper arm/shoulder/right forearm HEAD: Normal in size and contour. No evidence of trauma EYES: Lids open and close normally. No blepharitis, entropion or ectropion. PERRL. Conjunctivae are clear and sclerae are white. Lenses are without opacity EARS: Pinnae are normal. Patient hears normal voice tunes of the examiner MOUTH and THROAT: Lips are  without lesions. Oral mucosa is moist and without lesions. Tongue is normal in shape, size, and color and without lesions NECK: supple, trachea midline, no neck masses, no thyroid tenderness, no thyromegaly LYMPHATICS: no LAN in the neck, no supraclavicular LAN RESPIRATORY: breathing is even & unlabored, BS CTAB CARDIAC: RRR, no extra heart sounds, no edema, right chest IJ perm cath, LUA AVF + bruit GI: abdomen soft, normal BS, no masses, no tenderness, no hepatomegaly, no splenomegaly EXTREMITIES:  Bilateral AKA with dressing, able to move BUE PSYCHIATRIC: Alert and oriented X 3. Affect and behavior are appropriate  LABS/RADIOLOGY: Labs reviewed: Basic Metabolic Panel:  Recent Labs  16/10/96 2301  10/03/14 0235  10/08/14 0901  12/31/14 0440 12/31/14 0648 12/31/14 1606 01/01/15 0422 01/02/15 0443  NA 140  < > 136  < > 132*  < > 136 137 138 138 136  K 3.8  < > 3.7  < > 3.7  < > 3.8 3.7 4.0 4.9 4.1  CL 101  < > 96*  < > 96*  < > 95* 96* 99* 100* 96*  CO2 17*  < >  23  < > 21*  < > 29 27 25 23 27   GLUCOSE 162*  < > 171*  < > 136*  < > 125* 120* 116* 101* 143*  BUN 67*  < > 31*  < > 31*  < > 39* 41* 23* 30* 22*  CREATININE 4.65*  < > 3.68*  < > 3.66*  < > 3.77* 3.83* 2.74*  2.66* 3.24* 2.70*  CALCIUM 8.2*  < > 8.2*  < > 8.3*  < > 8.3* 8.3* 8.5* 8.4* 8.4*  MG 2.2  --  2.0  --  1.8  --   --   --   --   --   --   PHOS  --   < >  --   < >  --   < > 5.2* 5.2* 3.9  --   --   < > = values in this interval not displayed. Liver Function Tests:  Recent Labs  12/30/14 1220 12/31/14 0440 12/31/14 0648 12/31/14 1606  AST 34 25 23  --   ALT 27 28 27   --   ALKPHOS 137* 120 122  --   BILITOT 0.9 0.8 0.8  --   PROT 6.1* 5.7* 5.8*  --   ALBUMIN 2.1* 2.3* 2.3* 2.4*    Recent Labs  09/26/14 0100 10/04/14 0335  LIPASE 106* 119*    Recent Labs  09/30/14 0110  AMMONIA 28   CBC:  Recent Labs  10/08/14 0901  12/02/14 1956  12/08/14 1338  12/31/14 1606 01/01/15 0422 01/02/15 0443  WBC 6.5  < > 9.1  < > 15.9*  < > 10.1 11.9* 10.4  NEUTROABS 5.2  --  7.8*  --  14.5*  --   --   --   --   HGB 10.1*  < > 7.0*  < > 7.9*  < > 10.1* 9.4* 9.6*  HCT 30.7*  < > 22.7*  < > 25.2*  < > 33.3* 29.7* 31.2*  MCV 85.3  < > 96.6  < > 95.8  < > 96.0 93.4 96.3  PLT 128*  < > 135*  < > 151  < > 117* 122* 123*  < > = values in this interval not displayed.  Lipid Panel:  Recent Labs  09/25/14 0410 09/26/14 0345 12/03/14 0535  HDL <10* <10* 40*   Cardiac  Enzymes:  Recent Labs  09/30/14 0110 09/30/14 0510 09/30/14 1303  TROPONINI 0.07* 0.10* 0.11*   CBG:  Recent Labs  01/02/15 1627 01/02/15 2108 01/03/15 0557  GLUCAP 116* 169* 97     Dg Chest 2 View  12/08/2014   CLINICAL DATA:  Preop evaluation.  Sepsis with cellulitis both legs.  EXAM: CHEST  2 VIEW  COMPARISON:  10/17/2014  FINDINGS: Sternotomy wires and right IJ central venous catheter unchanged. Lungs are hypoinflated without focal consolidation or effusion. Mild stable cardiomegaly.  Remainder of the exam is unchanged.  IMPRESSION: Hypoinflation without acute cardiopulmonary disease.   Electronically Signed   By: Elberta Fortis M.D.   On: 12/08/2014 14:49    ASSESSMENT/PLAN:  Physical deconditioning - for rehabilitation  Gangrene of foot/left peripheral artery disease - S/P left AKA, continue oxycodone IR 5 mg 1-2 tabs by mouth every 4 hours when necessary, OxyContin 15 mg 12HR 1 tab by mouth every 8 hours, Neurontin 100 mg 1 capsule 3 times a day, follow-up with vascular surgeon, Dr. Fabienne Bruns, in 4 weeks  Atrial fibrillation - rate controlled; continue amiodarone 400 mg by mouth twice a day  and Coumadin 1 mg daily except Thursdays and Saturdays 0.5 mg  ESRD - continue hemodialysis and Phoslo 667 mg 2 capsules = 1334 mg by mouth 3 times a day  Anemia of chronic disease - hemoglobin 9.6; continue Aranesp 100 g/0.5 mL IV every Tuesdays with hemodialysis and iron 325 mg 1 tab by mouth daily   Thrombocytopenia - platelet 123; check CBC  Protein calorie malnutrition, severe - albumin 2.4; continue supplementation  Chronic combined systolic and diastolic CHF - compensated; managed with hemodialysis  Diabetes mellitus, type II - hemoglobin A1c 5.3; continue NovoLog sliding scale 3 times a day with meals  Insomnia - continue melatonin 3 mg 1 tab by mouth daily at bedtime  Hemodialysis associated hypotension - continue midodrine 10 mg 1 tab by mouth every Tuesdays, Thursdays and Saturdays with dialysis  GERD - continue Protonix 40 mg 1 tab by mouth daily  Constipation - continue MiraLAX 17 g by mouth daily when necessary and Colace 100 mg 1 capsule by mouth twice a day   Stage 3 skin ulcer of sacral region - continue wound treatment  CAD - continue NTG PRN     Goals of care:  Short-term rehabilitation    [facility], NP [facility] [phone]

## 2015-01-07 NOTE — Progress Notes (Signed)
Patient ID: [name], male   DOB: [birth year], 49 y.o.   MRN: [id number]     [facility]   PCP: [name], NP  Code Status: full code  No Known Allergies  Chief Complaint  Patient presents with  . New Admit To SNF     HPI:  49 year old patient is here for short term rehabilitation post hospital admission from 12/30/14-01/03/15 with severe PVD of left leg for elective left above knee amputation. He underwent Left AKA. He is seen in his room today. His pain is currently not under control with pain regimen. No other concerns. He has PMH of HTN, HLD, DM, GERD, CAD s/p CABG 2010, CHF, CKD stage 4, severe PVD, afib among others.   Review of Systems:  Constitutional: Negative for fever, chills, diaphoresis.  HENT: Negative for headache, congestion, nasal discharge Eyes: Negative for eye pain, blurred vision, double vision and discharge.  Respiratory: Negative for cough, shortness of breath and wheezing.   Cardiovascular: Negative for chest pain, palpitations.  Gastrointestinal: Negative for heartburn, nausea, vomiting, abdominal pain. Had bowel movement yesterday. Appetite is good Genitourinary: Negative for dysuria and flank pain. he is oliguric Musculoskeletal: Negative for back pain, falls Skin: Negative for itching, rash.  Neurological: Negative for dizziness, tingling, focal weakness Psychiatric/Behavioral: Negative for depression   Past Medical History  Diagnosis Date  . Coronary artery disease 07/05/2011    2D ECHO - EF ~40%, moderate concentric LV hypertrophy, LA moderately dilated, mild-moderate septal and inferior wall hypokinesis  . Hypertension   . CHF (congestive heart failure)   . Diabetes mellitus     Insulin dependent  . Cardiomyopathy   . Claudication 05/09/2011    Right and left anterior tibial arteries and Left SFA-occluded; Right CIA-<50% diameter reduction; Right Deep Profunda-70-99% diameter reduction; Right SFA->60%  diameter reduction; Right Distal Popliteal/Tibial Artery- >60% diameter reduction; Left CFA and Profunda- >50% diameter reduction  . 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 GERD (gastroesophageal reflux disease)   . Peripheral vascular disease   . 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 Chronic kidney disease (CKD), stage IV (severe)     Dialysis - t/th/Sa   Past Surgical History  Procedure Laterality Date  . Cardiac catheterization  05/07/2008    CABG  . Coronary artery bypass graft  06/2008    x3  . Insertion of dialysis catheter Right 10/01/2014    Procedure: INSERTION OF RIGHT INTERNAL JUGULAR DIALYSIS CATHETER;  Surgeon: Larina Earthly, MD;  Location: [facility] OR;  Service: Vascular;  Laterality: Right;  . Amputation Right 10/06/2014    Procedure: AMPUTATION ABOVE KNEE Right;  Surgeon: Sherren Kerns, MD;  Location: [facility] OR;  Service: Vascular;  Laterality: Right;  . Av fistula placement Left 10/10/2014    Procedure: LEFT BRACHIAL TO CEPHALIC ARTERIOVENOUS (AV) FISTULA CREATION;  Surgeon: Larina Earthly, MD;  Location: [facility] OR;  Service: Vascular;  Laterality: Left;  . Amputation Right 11/26/2014    Procedure: REVISION AMPUTATION ABOVE KNEE;  Surgeon: Sherren Kerns, MD;  Location: [facility] OR;  Service: Vascular;  Laterality: Right;  . Peripheral vascular catheterization N/A 12/12/2014    Procedure: Abdominal Aortogram;  Surgeon: Sherren Kerns, MD;  Location: [facility] INVASIVE CV LAB;  Service:  Cardiovascular;  Laterality: N/A;  . Amputation Left 12/31/2014    Procedure: AMPUTATION ABOVE KNEE;  Surgeon: Larina Earthly, MD;  Location: [facility] OR;  Service: Vascular;  Laterality: Left;   Social History:   reports that he quit smoking about 16 months ago. His smoking use included Cigarettes. He has a 12.5  pack-year smoking history. He has never used smokeless tobacco. He reports that he drinks alcohol. He reports that he uses illicit drugs (Marijuana).  Family History  Problem Relation Age of Onset  . Heart disease Mother   . Hypertension Mother   . Diabetes Mother   . Arthritis Sister   . Diabetes Sister     Medications: Patient's Medications  New Prescriptions   No medications on file  Previous Medications   AMINO ACIDS-PROTEIN HYDROLYS (FEEDING SUPPLEMENT, PRO-STAT SUGAR FREE 64,) LIQD    Take 30 mLs by mouth 3 (three) times daily with meals. Ok to mixed it out with juice or any other drinks to facilitate intake   AMIODARONE (PACERONE) 400 MG TABLET    Take 1 tablet (400 mg total) by mouth daily.   CALCIUM ACETATE (PHOSLO) 667 MG CAPSULE    Take 2 capsules (1,334 mg total) by mouth 3 (three) times daily with meals.   DARBEPOETIN ALFA (ARANESP) 100 MCG/0.5ML SOSY INJECTION    Inject 0.5 mLs (100 mcg total) into the vein every Tuesday with hemodialysis.   DOCUSATE SODIUM (COLACE) 100 MG CAPSULE    Take 1 capsule (100 mg total) by mouth 2 (two) times daily. Hold for diarrhea   FEEDING SUPPLEMENT (BOOST / RESOURCE BREEZE) LIQD    Take 1 Container by mouth 3 (three) times daily between meals.   FERROUS SULFATE (IRON) 325 (65 FE) MG TABS    Take 325 mg by mouth daily.   FOLIC ACID (FOLVITE) 1 MG TABLET    Take 1 tablet (1 mg total) by mouth daily.   GABAPENTIN (NEURONTIN) 100 MG CAPSULE    Take 1 capsule (100 mg total) by mouth 3 (three) times daily.   INSULIN ASPART (NOVOLOG) 100 UNIT/ML INJECTION    Inject 0-9 Units into the skin 3 (three) times daily with meals. Before each meal 3 times a day, 140-199 - 2 units, 200-250 - 4 units, 251-299 - 6 units,  300-349 - 8 units,  350 or above 10 units. Insulin PEN if approved, provide syringes and needles if needed.   MELATONIN 3 MG TABS    Take 3 mg by mouth at bedtime.   MIDODRINE (PROAMATINE) 10 MG TABLET    Take 10 mg by mouth See admin  instructions. Only on Tuesday, Thursday, Saturday with Hemodialysyis   MULTIPLE VITAMINS-MINERALS (DECUBI-VITE PO)    Take 1 tablet by mouth daily.   NITROGLYCERIN (NITROSTAT) 0.4 MG SL TABLET    Place 1 tablet (0.4 mg total) under the tongue once.   NUTRITIONAL SUPPLEMENTS (FEEDING SUPPLEMENT, NEPRO CARB STEADY,) LIQD    Take 237 mLs by mouth 3 (three) times daily between meals.   OXYCODONE (OXY IR/ROXICODONE) 5 MG IMMEDIATE RELEASE TABLET    Take 1-2 tablets (5-10 mg total) by mouth every 4 (four) hours as needed for breakthrough pain.   OXYCODONE (OXYCONTIN) 15 MG T12A 12 HR TABLET    Take 1 tablet (15 mg total) by mouth every 8 (eight) hours.   PANTOPRAZOLE (PROTONIX) 40 MG TABLET    Take 40 mg by mouth daily.   POLYETHYLENE GLYCOL (MIRALAX / GLYCOLAX) PACKET    Take  17 g by mouth daily as needed for moderate constipation.   WARFARIN (COUMADIN) 1 MG TABLET    Take  daily except for Thursday and Saturday where patient will take 0.5mg   Modified Medications   No medications on file  Discontinued Medications   No medications on file     Physical Exam: Filed Vitals:   01/07/15 1516  BP: 124/60  Pulse: 84  Temp: 99 F (37.2 C)  Resp: 18  SpO2: 93%    General- adult male, frail, thin built, in no acute distress Head- normocephalic, atraumatic Nose- normal nasal mucosa, no maxillary or frontal sinus tenderness, no nasal discharge Throat- moist mucus membrane Eyes- PERRLA, EOMI, mild pallor and icterus noted, no discharge, normal conjunctiva Neck- no cervical lymphadenopathy, no jugular vein distension Cardiovascular- normal s1,s2, no murmurs Respiratory- bilateral clear to auscultation, no wheeze, no rhonchi, no crackles, no use of accessory muscles Abdomen- bowel sounds present, soft, non tender Musculoskeletal- left and right AKA, able to move upper extremities, left arm AV fistula with good thrill Neurological- no focal deficit Skin- warm and dry, skin tear and easy bruising  noted, has stage 3 pressure ulcer sacral area and left above knee amputation site has dressing in place Psychiatry- alert and oriented, normal mood and affect    Labs reviewed: Basic Metabolic Panel:  Recent Labs  81/19/14 2301  10/03/14 0235  10/08/14 0901  12/31/14 0440 12/31/14 0648 12/31/14 1606 01/01/15 0422 01/02/15 0443  NA 140  < > 136  < > 132*  < > 136 137 138 138 136  K 3.8  < > 3.7  < > 3.7  < > 3.8 3.7 4.0 4.9 4.1  CL 101  < > 96*  < > 96*  < > 95* 96* 99* 100* 96*  CO2 17*  < > 23  < > 21*  < > GLUCOSE 162*  < > 171*  < > 136*  < > 125* 120* 116* 101* 143*  BUN 67*  < > 31*  < > 31*  < > 39* 41* 23* 30* 22*  CREATININE 4.65*  < > 3.68*  < > 3.66*  < > 3.77* 3.83* 2.74*  2.66* 3.24* 2.70*  CALCIUM 8.2*  < > 8.2*  < > 8.3*  < > 8.3* 8.3* 8.5* 8.4* 8.4*  MG 2.2  --  2.0  --  1.8  --   --   --   --   --   --   PHOS  --   < >  --   < >  --   < > 5.2* 5.2* 3.9  --   --   < > = values in this interval not displayed. Liver Function Tests:  Recent Labs  12/30/14 1220 12/31/14 0440 12/31/14 0648 12/31/14 1606  AST 34 25 23  --   ALT --   ALKPHOS 137* 120 122  --   BILITOT 0.9 0.8 0.8  --   PROT 6.1* 5.7* 5.8*  --   ALBUMIN 2.1* 2.3* 2.3* 2.4*    Recent Labs  09/26/14 0100 10/04/14 0335  LIPASE 106* 119*    Recent Labs  09/30/14 0110  AMMONIA 28   CBC:  Recent Labs  10/08/14 0901  12/02/14 1956  12/08/14 1338  12/31/14 1606 01/01/15 0422 01/02/15 0443  WBC 6.5  < > 9.1  < > 15.9*  < > 10.1 11.9* 10.4  NEUTROABS 5.2  --  7.8*  --  14.5*  --   --   --   --   HGB 10.1*  < > 7.0*  < > 7.9*  < > 10.1* 9.4* 9.6*  HCT 30.7*  < > 22.7*  < > 25.2*  < > 33.3* 29.7* 31.2*  MCV 85.3  < > 96.6  < > 95.8  < > 96.0 93.4 96.3  PLT 128*  < > 135*  < > 151  < > 117* 122* 123*  < > = values in this interval not displayed. Cardiac Enzymes:  Recent Labs  09/30/14 0110 09/30/14 0510 09/30/14 1303  TROPONINI 0.07* 0.10* 0.11*    BNP: Invalid input(s): POCBNP CBG:  Recent Labs  01/02/15 1627 01/02/15 2108 01/03/15 0557  GLUCAP 116* 169* 97    Radiological Exams:  US Renal  09/25/2014   CLINICAL DATA:  Acute on chronic renal failure.  EXAM: RENAL / URINARY TRACT ULTRASOUND COMPLETE  COMPARISON:  Abdominal ultrasound 01/14/2014  FINDINGS: Right Kidney:  Length: 9.4 cm. Thinning of the renal parenchyma with increased cortical echogenicity. No mass or hydronephrosis visualized.  Left Kidney:  Length: 9.2 cm. Thinning of the renal parenchyma and increased renal echogenicity. No mass or hydronephrosis visualized.  Bladder:  Appears normal for degree of bladder distention.  Incidental: There is a moderate volume of ascites in the abdomen and pelvis.  IMPRESSION: 1. Small echogenic kidneys consistent chronic medical renal disease. No obstructive uropathy. 2. Moderate volume of intra-abdominal and pelvic ascites.   Electronically Signed   By: Rubye Oaks M.D.   On: 09/25/2014 01:16   Dg Chest Port 1 View  10/17/2014   CLINICAL DATA:  Shortness of breath, chest pressure last night  EXAM: PORTABLE CHEST - 1 VIEW  COMPARISON:  10/01/2014  FINDINGS: Cardiomegaly again noted. Status post CABG. Dual lumen right IJ catheter is unchanged in position. No acute infiltrate or pulmonary edema. There is no pneumothorax.  IMPRESSION: No active disease. Cardiomegaly again noted. Status post CABG. Stable dual lumen right IJ catheter position. No pneumothorax.   Electronically Signed   By: Natasha Mead M.D.   On: 10/17/2014 09:09   Dg Chest Port 1 View  10/01/2014   CLINICAL DATA:  Status post catheter insertion  EXAM: PORTABLE CHEST - 1 VIEW  COMPARISON:  09/27/2014  FINDINGS: There is a dual-lumen right-sided central venous catheter with the tip in satisfactory position. There is mild bilateral interstitial thickening. There is no pleural effusion or pneumothorax. There is stable cardiomegaly. There is evidence of prior CABG.  The osseous  structures are unremarkable.  IMPRESSION: Dual-lumen right-sided central venous catheter with the tips in satisfactory position. No pneumothorax.  Mild pulmonary edema.   Electronically Signed   By: Elige Ko   On: 10/01/2014 12:04   Dg Chest Portable 1 View  09/27/2014   CLINICAL DATA:  Central line placement  EXAM: PORTABLE CHEST - 1 VIEW  COMPARISON:  09/24/2014  FINDINGS: There is a central line from an inferior approach which terminates in the right atrium. Mild pulmonary edema pattern. No pneumothorax.  IMPRESSION: Central venous line from a femoral approach with tip in the right atrium.   Electronically Signed   By: Genevive Bi M.D.   On: 09/27/2014 19:30   Dg Chest Port 1 View  09/24/2014   CLINICAL DATA:  Weakness and lower extremity edema.  EXAM: PORTABLE CHEST - 1 VIEW  COMPARISON:  09/28/2013  FINDINGS: Stable mild cardiac enlargement and evidence of prior  CABG. Lung volumes are low bilaterally. There is no evidence of pulmonary edema, consolidation, pneumothorax, nodule or pleural fluid.  IMPRESSION: Stable mild cardiomegaly.  Low lung volumes without acute findings.   Electronically Signed   By: Irish Lack M.D.   On: 09/24/2014 19:38   US Abdomen Limited Ruq  10/05/2014   CLINICAL DATA:  Jaundice  EXAM: US ABDOMEN LIMITED - RIGHT UPPER QUADRANT  COMPARISON:  None  FINDINGS: Gallbladder:  Well distended with echogenic material consistent with gallbladder sludge. No gallstones are seen. Diffuse ascites is seen contributing to mild wall thickening of 3.7 mm.  Common bile duct:  Diameter: 3.2 mm.  Liver:  No focal lesion identified. Within normal limits in parenchymal echogenicity.  IMPRESSION: Diffuse ascites.  Gallbladder sludge.   Electronically Signed   By: Alcide Clever M.D.   On: 10/05/2014 09:04    Assessment/Plan  Physical deconditioning  Will have him work with physical therapy and occupational therapy team to help with muscle strengthening exercises, gait training and  restoring his functions.fall precautions. Skin care. Encourage to be out of bed.   Left leg PAD s/p left AKA. Continue wound care with dressing. Has follow up with vascular team. Currently on oxycontin 15 mg bid with oxycodone 10 mg 1-2 tab q4h prn pain. Change oxycontin to 20 mg bid and reassess. To work with therapy team. Continue colace and miralax to help prevent constipation  Blood loss anemia Post op and also has anemia of ckd. Continue aranesp and iron supplement, monitor cbc and continue colace with miralax to avoid constipation  Protein calorie malnutrition Monitor weight and continue feeding supplement, dietary consult, continue decubivite  ESRD On HD 3 days a week. Continue phoslo and nepro  Hypotension from HD Continue midodrine 10 mg on dialysis days and monitor bp  Stage 3 pressure ulcer In sacral area, continue skin care and pressure ulcer prophylaxis. Monitor po intake. Continue decubivite  afib Rate currently controlled. Continue amiodarone 400 mg daily and coumiandin with INR check 01/08/15. Goal inr 2-3.   Chronic combined CHF  EF 20-25%, not a candidate for ICD. Weight monitoring  Diabetes mellitus, type II continue NovoLog SSI with meals and monitor cbg  GERD Stable, continue Protonix 40 mg daily   Goals of care: short term rehabilitation   Labs/tests ordered: cbc, cmp  Family/ staff Communication: reviewed care plan with patient and nursing supervisor    Oneal Grout, MD  [facility] [phone] (Monday-Friday 8 am - 5 pm) [phone] (afterhours)

## 2015-01-16 ENCOUNTER — Encounter: Payer: Self-pay | Admitting: Adult Health

## 2015-01-16 ENCOUNTER — Non-Acute Institutional Stay (SKILLED_NURSING_FACILITY): Payer: Medicare Other | Admitting: Adult Health

## 2015-01-16 DIAGNOSIS — Z7901 Long term (current) use of anticoagulants: Secondary | ICD-10-CM

## 2015-01-16 DIAGNOSIS — I482 Chronic atrial fibrillation, unspecified: Secondary | ICD-10-CM

## 2015-01-18 NOTE — Progress Notes (Signed)
Patient ID: [name], male   DOB: 1965-07-10, 49 y.o.   MRN: [id number] Subjective:     Indication: atrial fibrillation Bleeding signs/symptoms: None Thromboembolic signs/symptoms: None  Missed Coumadin doses: None Medication changes: no Dietary changes: no Bacterial/viral infection: yes -  Wound Other concerns: no   Review of Systems A comprehensive review of systems was negative.   Objective:    INR Today: 1.9 Current dose:  Coumadin 1 mg Q M-W-F and 0.5 mg PO Q TThSat and Sun  Assessment:    Subtherapeutic INR for goal of 2-3   Plan:    1. New dose:  Change Coumadin to 1 mg PO Q M-W-F_Sat and Coumadin 0.5 mg PO Q TThSun   2. Next INR:  01/20/15

## 2015-01-19 ENCOUNTER — Encounter: Payer: Self-pay | Admitting: Family

## 2015-01-19 ENCOUNTER — Telehealth: Payer: Self-pay

## 2015-01-19 ENCOUNTER — Encounter: Payer: Self-pay | Admitting: Surgery

## 2015-01-19 ENCOUNTER — Ambulatory Visit (INDEPENDENT_AMBULATORY_CARE_PROVIDER_SITE_OTHER): Payer: Medicare Other | Admitting: Family

## 2015-01-19 ENCOUNTER — Ambulatory Visit: Payer: Self-pay | Admitting: Family

## 2015-01-19 VITALS — BP 136/74 | HR 73 | Temp 97.7°F | Resp 16 | Ht 71.0 in | Wt 149.0 lb

## 2015-01-19 DIAGNOSIS — Z87891 Personal history of nicotine dependence: Secondary | ICD-10-CM

## 2015-01-19 DIAGNOSIS — Z89612 Acquired absence of left leg above knee: Secondary | ICD-10-CM

## 2015-01-19 DIAGNOSIS — T8789 Other complications of amputation stump: Secondary | ICD-10-CM

## 2015-01-19 DIAGNOSIS — Z992 Dependence on renal dialysis: Secondary | ICD-10-CM

## 2015-01-19 DIAGNOSIS — Z89611 Acquired absence of right leg above knee: Secondary | ICD-10-CM

## 2015-01-19 DIAGNOSIS — N186 End stage renal disease: Secondary | ICD-10-CM

## 2015-01-19 DIAGNOSIS — I779 Disorder of arteries and arterioles, unspecified: Secondary | ICD-10-CM

## 2015-01-19 NOTE — Progress Notes (Signed)
[facility] [place] HISTORY AND PHYSICAL -PAD  History of Present Illness [name] is a 49 y.o. male patient of Dr. 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 This subsequently was revised to a higher above-knee amputation in August 2016 when the lower above-knee amputation failed to heal. The patient has also had a previous left brachiocephalic AV fistula placed by Dr. Arbie Cookey in June 2016. 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 He is currently dialyzing via a catheter.   He underwent an arteriogram recently. He was deemed to be unreconstructable due to his overall debility and poor bypass targets. He was offered an above-knee amputation recently but refused.   He has had ongoing problems with chronic severe malnutrition with a recent serum pre-albumen being 7.2. Discussions were held with the patient about placing a feeding tube at that time and he also refused. His malnutrition was so severe that he required vitamin K and additionally more than 6 units of fresh frozen plasma to reverse his Coumadin despite being off the medication for several days.  He returns today at the request of Dr. Alex Gardener re: worsening condition of right AKA stump incision. Reported the pt. has had serous drainage from the right AKA incision, but on Saturday, had a "greenish - bluish" drainage from site. Stated the pt. was started on Vancomycin. Reported there is dehiscence of the right AKA incision site, and also yellow drainage today. Also, voiced concern about pt. "needing a Prosthetist evaluation to assess need for foam protectors to bilat. AKA sites, during transfers."   Per pt's appt. desk, he was previously scheduled to see the NP for staple removal of right AKA incision.  Pt states that he thinks the hoyer lift is rubbing his right AKA site and  that he uses the right AKA stump to pivot. Pt states his appetite is very good lately.    Pt Diabetic: Yes, 12/31/14 his A1C was 5.3, good control Pt smoker: former smoker, quit May 2015  Pt meds include: Statin :No Betablocker: No ASA: No Other anticoagulants/antiplatelets: coumadin  Past Medical History  Diagnosis Date  . Coronary artery disease 07/05/2011    2D ECHO - EF ~40%, moderate concentric LV hypertrophy, LA moderately dilated, mild-moderate septal and inferior wall hypokinesis  . Hypertension   . CHF (congestive heart failure)   . Diabetes mellitus     Insulin dependent  . Cardiomyopathy   . Claudication 05/09/2011    Right and left anterior tibial arteries and Left SFA-occluded; Right CIA-<50% diameter reduction; Right Deep Profunda-70-99% diameter reduction; Right SFA->60% diameter reduction; Right Distal Popliteal/Tibial Artery- >60% diameter reduction; Left CFA and Profunda- >50% diameter reduction  . 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 GERD (gastroesophageal reflux disease)   . Peripheral vascular disease   . 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 Chronic kidney disease (CKD), stage IV (severe)     Dialysis - t/th/Sa    Social History Social History  Substance Use Topics  . Smoking status: Former Smoker -- 0.50 packs/day for 25 years    Types: Cigarettes    Quit date: 09/03/2013  . Smokeless tobacco:  Never Used  . Alcohol Use: Yes     Comment: occas.    Family History Family History  Problem Relation Age of Onset  . Heart disease Mother   . Hypertension Mother   . Diabetes Mother   . Arthritis Sister   . Diabetes Sister     Past Surgical History  Procedure Laterality Date  . Cardiac catheterization  05/07/2008    CABG  . Coronary  artery bypass graft  06/2008    x3  . Insertion of dialysis catheter Right 10/01/2014    Procedure: INSERTION OF RIGHT INTERNAL JUGULAR DIALYSIS CATHETER;  Surgeon: Larina Earthly, MD;  Location[facility] OR;  Service: Vascular;  Laterality: Right;  . Amputation Right 10/06/2014    Procedure: AMPUTATION ABOVE KNEE Right;  Surgeon: Sherren Kerns, MD;  Location: [facility] OR;  Service: Vascular;  Laterality: Right;  . Av fistula placement Left 10/10/2014    Procedure: LEFT BRACHIAL TO CEPHALIC ARTERIOVENOUS (AV) FISTULA CREATION;  Surgeon: Larina Earthly, MD;  Location: [facility] OR;  Service: Vascular;  Laterality: Left;  . Amputation Right 11/26/2014    Procedure: REVISION AMPUTATION ABOVE KNEE;  Surgeon: Sherren Kerns, MD;  Location: [facility] OR;  Service: Vascular;  Laterality: Right;  . Peripheral vascular catheterization N/A 12/12/2014    Procedure: Abdominal Aortogram;  Surgeon: Sherren Kerns, MD;  Location: [facility] INVASIVE CV LAB;  Service: Cardiovascular;  Laterality: N/A;  . Amputation Left 12/31/2014    Procedure: AMPUTATION ABOVE KNEE;  Surgeon: Larina Earthly, MD;  Location: [facility] OR;  Service: Vascular;  Laterality: Left;    No Known Allergies  Current Outpatient Prescriptions  Medication Sig Dispense Refill  . Amino Acids-Protein Hydrolys (FEEDING SUPPLEMENT, PRO-STAT SUGAR FREE 64,) LIQD Take 30 mLs by mouth 3 (three) times daily with meals. Ok to mixed it out with juice or any other drinks to facilitate intake 900 mL 0  . amiodarone (PACERONE) 400 MG tablet Take 1 tablet (400 mg total) by mouth daily.    . calcium acetate (PHOSLO) 667 MG capsule Take 2 capsules (1,334 mg total) by mouth 3 (three) times daily with meals. 90 capsule 0  . Darbepoetin Alfa (ARANESP) 100 MCG/0.5ML SOSY injection Inject 0.5 mLs (100 mcg total) into the vein every Tuesday with hemodialysis. 4.2 mL 0  . docusate sodium (COLACE) 100 MG capsule Take 1 capsule (100 mg total) by mouth 2 (two) times daily. Hold for diarrhea    . feeding  supplement (BOOST / RESOURCE BREEZE) LIQD Take 1 Container by mouth 3 (three) times daily between meals.  0  . Ferrous Sulfate (IRON) 325 (65 FE) MG TABS Take 325 mg by mouth daily.    . folic acid (FOLVITE) 1 MG tablet Take 1 tablet (1 mg total) by mouth daily. 30 tablet 0  . gabapentin (NEURONTIN) 100 MG capsule Take 1 capsule (100 mg total) by mouth 3 (three) times daily.    . insulin aspart (NOVOLOG) 100 UNIT/ML injection Inject 0-9 Units into the skin 3 (three) times daily with meals. Before each meal 3 times a day, 140-199 - 2 units, 200-250 - 4 units, 251-299 - 6 units,  300-349 - 8 units,  350 or above 10 units. Insulin PEN if approved, provide syringes and needles if needed. (Patient taking differently: Inject 0-10 Units into the skin 4 (four) times daily -  with meals and at bedtime. 140-199 - 2 units, 200-250 - 4 units, 251-299 - 6 units,  300-349 - 8 units,  350 or above 10 units. Insulin PEN if approved, provide syringes and needles if needed) 10 mL 11  . Melatonin 3 MG TABS Take 3 mg by mouth at bedtime.    . midodrine (PROAMATINE) 10 MG tablet Take 10 mg by mouth See admin instructions. Only on Tuesday, Thursday, Saturday with Hemodialysyis    . Multiple Vitamins-Minerals (DECUBI-VITE PO) Take 1 tablet by mouth daily.    . nitroGLYCERIN (NITROSTAT) 0.4 MG SL tablet Place 1 tablet (0.4 mg total) under the tongue once. (Patient taking differently: Place 0.4 mg under the tongue every 5 (five) minutes as needed for chest pain. ) 25 tablet 5  . Nutritional Supplements (FEEDING SUPPLEMENT, NEPRO CARB STEADY,) LIQD Take 237 mLs by mouth 3 (three) times daily between meals.    Marland Kitchen oxyCODONE (OXY IR/ROXICODONE) 5 MG immediate release tablet Take 1-2 tablets (5-10 mg total) by mouth every 4 (four) hours as needed for breakthrough pain. 30 tablet 0  . OxyCODONE (OXYCONTIN) 15 mg T12A 12 hr tablet Take 1 tablet (15 mg total) by mouth every 8 (eight) hours. 30 tablet 0  . pantoprazole (PROTONIX) 40 MG  tablet Take 40 mg by mouth daily.    . polyethylene glycol (MIRALAX / GLYCOLAX) packet Take 17 g by mouth daily as needed for moderate constipation. 14 each 0  . warfarin (COUMADIN) 1 MG tablet Take  daily except for Thursday and Saturday where patient will take 0.5mg      No current facility-administered medications for this visit.    ROS: See HPI for pertinent positives and negatives.   Physical Examination  Filed Vitals:   01/19/15 1047  BP: 136/74  Pulse: 73  Temp: 97.7 F (36.5 C)  TempSrc: Oral  Resp: 16  Height:  (1.803 m)  Weight: 149 lb (67.586 kg)  SpO2: 99%   Body mass index is 20.79 kg/(m^2).  General: A&O x 3, WDWN, thin male seated in wheelchair. Gait: bilateral AKA's, seated in wheelchair Eyes: Pupils are equal Pulmonary: Respirations are non labored.  Cardiac: regular rythm       Aorta is not palpable. Radial pulses: are 1+ palpable and =                           VASCULAR EXAM: Extremities without ischemic changes, bilateral AKA's with staples in place, without Gangrene. Right AKA incision with scant amount of white thin purulent drainage at the medial end of the incision. No open wounds.                                                                                                           LE Pulses Right Left       FEMORAL  not palpable seated in wheelchair  not palpable seated in wheelchair        POPLITEAL  AKA   AKA       POSTERIOR TIBIAL  AKA   AKA        DORSALIS PEDIS      ANTERIOR TIBIAL  AKA  AKA    Abdomen: soft, NT, no palpable masses. Skin: no rashes, see Extremities. Musculoskeletal: no muscle wasting or atrophy.  Neurologic: A&O X 3; Appropriate Affect; MOTOR FUNCTION:  moving all extremities equally, motor strength 5/5 throughout. Speech is fluent/normal. CN 2-12 grossly intact.    ASSESSMENT: [name] is a 49 y.o. 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  This subsequently was revised to a higher above-knee amputation in August 2016 when the lower above-knee amputation failed to heal. The patient has also had a previous left brachiocephalic AV fistula placed by Dr. Arbie Cookey in June 2016.  He is a resident of [facility] while he undergoes rehabilitation.  He returns  today at the request of Dr. Alex Gardener for evaluation of right AKA incision, small amount of white purulent drainage at the medial aspect of the incision. Pt is receiving vancomycin in hemodialysis.   Several staples removed from the medial aspect of right AKA site by Dr. Myra Gianotti; right AKA incision probed with a sterile cotton swab, depth of about 4 cm reached, most staples remain.   PLAN:  Based on the patient's vascular studies and examination, pt will return to clinic in 1 weeks for right AKA wound evaluation, he is already scheduled to see Dr. Arbie Cookey next week. Continue Vancomycin during hemodialysis.  Daily dry dressing change to right AKA site to absorb drainage.  The patient was given information about PAD including signs, symptoms, treatment, what symptoms should prompt the patient to seek immediate medical care, and risk reduction measures to take.  Charisse March, RN, MSN, FNP-C [facility] [place] Phone: [phone]  Clinic MD: Myra Gianotti  01/19/2015 10:44 AM

## 2015-01-19 NOTE — Telephone Encounter (Signed)
Rec'd call from Dr. Alex Gardener re: worsening condition of right AKA stump incision.  Reported the pt. has had serous drainage from the right AKA incision, but on Saturday, had a "greenish - bluish" drainage from site.  Stated the pt. was started on Vancomycin.  Reported there is dehiscence of the right AKA incision site, and also yellow drainage today.  Also, voiced concern about pt. "needing a Prosthetist evaluation to assess need for foam protectors to bilat. AKA sites, during transfers." Per Dr. Alex Gardener, the pt. is on his way to our office at this time, for an appt.  Per pt's appt. desk, he was previously scheduled to see the NP for staple removal of right AKA incision.  Will add appt. On to be evaluated today, per request of Dr. Alex Gardener.

## 2015-01-19 NOTE — Patient Instructions (Signed)

## 2015-01-23 ENCOUNTER — Encounter: Payer: Self-pay | Admitting: Vascular Surgery

## 2015-01-27 ENCOUNTER — Ambulatory Visit (INDEPENDENT_AMBULATORY_CARE_PROVIDER_SITE_OTHER): Payer: Medicare Other | Admitting: Vascular Surgery

## 2015-01-27 ENCOUNTER — Other Ambulatory Visit: Payer: Self-pay | Admitting: *Deleted

## 2015-01-27 ENCOUNTER — Encounter: Payer: Self-pay | Admitting: Adult Health

## 2015-01-27 ENCOUNTER — Encounter: Payer: Self-pay | Admitting: Vascular Surgery

## 2015-01-27 VITALS — BP 131/77 | HR 80 | Temp 98.3°F | Ht 71.0 in | Wt 146.0 lb

## 2015-01-27 DIAGNOSIS — Z89611 Acquired absence of right leg above knee: Secondary | ICD-10-CM

## 2015-01-27 DIAGNOSIS — Z89612 Acquired absence of left leg above knee: Secondary | ICD-10-CM

## 2015-01-27 MED ORDER — OXYCODONE HCL 5 MG PO TABS: 5.0000 mg | ORAL_TABLET | ORAL | Status: DC | PRN

## 2015-01-27 NOTE — Telephone Encounter (Signed)
[facility] 

## 2015-01-27 NOTE — Progress Notes (Signed)
Here today for follow-up of bilateral above-knee amputations. Had had the revision just over a month ago to his nonhealing right above-knee amputation with Dr. Darrick Penna. Underwent left above-the-knee amputation by myself on 12/31/2014. His left above-knee amputation appears to be healing well. Suture line is intact with no evidence of infection. His revised segment on the right also looks good there. There is some slight serous drainage but does appear to be healing. Offered the staple removal today. He reports that he is having nausea today and is on his way to dialysis. Requested these be removed later in the week on nondialysis day which I feel is acceptable. Will have this stable removed and then follow-up with Dr. fields in approximately one month

## 2015-01-28 ENCOUNTER — Encounter: Payer: Self-pay | Admitting: Vascular Surgery

## 2015-01-30 ENCOUNTER — Ambulatory Visit: Payer: Medicare Other | Admitting: *Deleted

## 2015-01-30 DIAGNOSIS — I779 Disorder of arteries and arterioles, unspecified: Secondary | ICD-10-CM

## 2015-01-30 NOTE — Progress Notes (Signed)
Removed staples from bilateral AKA sites.  Once staples were removed I cleansed both sites with wound cleanser and placed a folded ABD pad over sites. I gave patient verbal instructions on the cleansing instructions and Karsten Ro  PA, gave the patient written instructions to give to the nursing facility.   The patient is to return as needed and voiced understanding of the instructions.

## 2015-02-19 ENCOUNTER — Emergency Department (HOSPITAL_COMMUNITY): Payer: Medicare Other

## 2015-02-19 ENCOUNTER — Emergency Department (HOSPITAL_COMMUNITY)
Admission: EM | Admit: 2015-02-19 | Discharge: 2015-02-19 | Disposition: A | Discharge status: Home / Self Care | Payer: Medicare Other | Attending: Emergency Medicine | Admitting: Emergency Medicine

## 2015-02-19 ENCOUNTER — Encounter (HOSPITAL_COMMUNITY): Payer: Self-pay

## 2015-02-19 DIAGNOSIS — Z87891 Personal history of nicotine dependence: Secondary | ICD-10-CM | POA: Insufficient documentation

## 2015-02-19 DIAGNOSIS — I129 Hypertensive chronic kidney disease with stage 1 through stage 4 chronic kidney disease, or unspecified chronic kidney disease: Secondary | ICD-10-CM | POA: Insufficient documentation

## 2015-02-19 DIAGNOSIS — Z9889 Other specified postprocedural states: Secondary | ICD-10-CM | POA: Insufficient documentation

## 2015-02-19 DIAGNOSIS — M7981 Nontraumatic hematoma of soft tissue: Secondary | ICD-10-CM | POA: Diagnosis not present

## 2015-02-19 DIAGNOSIS — Z89511 Acquired absence of right leg below knee: Secondary | ICD-10-CM | POA: Insufficient documentation

## 2015-02-19 DIAGNOSIS — I2510 Atherosclerotic heart disease of native coronary artery without angina pectoris: Secondary | ICD-10-CM | POA: Insufficient documentation

## 2015-02-19 DIAGNOSIS — I509 Heart failure, unspecified: Secondary | ICD-10-CM | POA: Diagnosis not present

## 2015-02-19 DIAGNOSIS — Z89512 Acquired absence of left leg below knee: Secondary | ICD-10-CM | POA: Diagnosis not present

## 2015-02-19 DIAGNOSIS — Z794 Long term (current) use of insulin: Secondary | ICD-10-CM | POA: Insufficient documentation

## 2015-02-19 DIAGNOSIS — L89309 Pressure ulcer of unspecified buttock, unspecified stage: Secondary | ICD-10-CM | POA: Diagnosis not present

## 2015-02-19 DIAGNOSIS — K219 Gastro-esophageal reflux disease without esophagitis: Secondary | ICD-10-CM | POA: Diagnosis not present

## 2015-02-19 DIAGNOSIS — N184 Chronic kidney disease, stage 4 (severe): Secondary | ICD-10-CM | POA: Insufficient documentation

## 2015-02-19 DIAGNOSIS — R042 Hemoptysis: Secondary | ICD-10-CM | POA: Insufficient documentation

## 2015-02-19 DIAGNOSIS — E119 Type 2 diabetes mellitus without complications: Secondary | ICD-10-CM | POA: Diagnosis not present

## 2015-02-19 DIAGNOSIS — Z79899 Other long term (current) drug therapy: Secondary | ICD-10-CM | POA: Insufficient documentation

## 2015-02-19 DIAGNOSIS — Z7901 Long term (current) use of anticoagulants: Secondary | ICD-10-CM | POA: Diagnosis not present

## 2015-02-19 DIAGNOSIS — Z951 Presence of aortocoronary bypass graft: Secondary | ICD-10-CM | POA: Diagnosis not present

## 2015-02-19 LAB — CBC WITH DIFFERENTIAL/PLATELET
Basophils Absolute: 0 10*3/uL (ref 0.0–0.1)
Basophils Relative: 0 %
EOS ABS: 0.2 10*3/uL (ref 0.0–0.7)
Eosinophils Relative: 2 %
HEMATOCRIT: 31.6 % — AB (ref 39.0–52.0)
HEMOGLOBIN: 9.9 g/dL — AB (ref 13.0–17.0)
LYMPHS ABS: 0.7 10*3/uL (ref 0.7–4.0)
LYMPHS PCT: 9 %
MCH: 29.2 pg (ref 26.0–34.0)
MCHC: 31.3 g/dL (ref 30.0–36.0)
MCV: 93.2 fL (ref 78.0–100.0)
MONOS PCT: 11 %
Monocytes Absolute: 0.8 10*3/uL (ref 0.1–1.0)
NEUTROS ABS: 5.4 10*3/uL (ref 1.7–7.7)
NEUTROS PCT: 77 %
Platelets: 95 10*3/uL — ABNORMAL LOW (ref 150–400)
RBC: 3.39 MIL/uL — AB (ref 4.22–5.81)
RDW: 17.6 % — ABNORMAL HIGH (ref 11.5–15.5)
WBC: 7 10*3/uL (ref 4.0–10.5)

## 2015-02-19 LAB — BASIC METABOLIC PANEL
Anion gap: 19 — ABNORMAL HIGH (ref 5–15)
BUN: 57 mg/dL — AB (ref 6–20)
CHLORIDE: 95 mmol/L — AB (ref 101–111)
CO2: 25 mmol/L (ref 22–32)
Calcium: 8.7 mg/dL — ABNORMAL LOW (ref 8.9–10.3)
Creatinine, Ser: 5.21 mg/dL — ABNORMAL HIGH (ref 0.61–1.24)
GFR calc Af Amer: 14 mL/min — ABNORMAL LOW (ref >60)
GFR calc non Af Amer: 12 mL/min — ABNORMAL LOW (ref >60)
Glucose, Bld: 129 mg/dL — ABNORMAL HIGH (ref 65–99)
POTASSIUM: 5.3 mmol/L — AB (ref 3.5–5.1)
SODIUM: 139 mmol/L (ref 135–145)

## 2015-02-19 LAB — PROTIME-INR
INR: 2.99 — AB (ref 0.00–1.49)
PROTHROMBIN TIME: 30.5 s — AB (ref 11.6–15.2)

## 2015-02-19 MED ORDER — OXYCODONE-ACETAMINOPHEN 5-325 MG PO TABS
1.0000 | ORAL_TABLET | Freq: Once | ORAL | Status: AC
  Administered 2015-02-19: 1 via ORAL
  Filled 2015-02-19: qty 1

## 2015-02-19 NOTE — Discharge Instructions (Signed)
Go directly to hemodialysis upon leaving here

## 2015-02-19 NOTE — ED Notes (Addendum)
Per EMS - pt from rehab facility. Pt had nosebleed yesterday, coughed up 1 blood clot this morning. Pt is on coumadin. Pt denies any other symptoms or complaints. Denies any new pain other than chronic tailbone pain. Initially 87% on RA, placed on 2L [place] - 98%. Pt supposed to have dialysis today.

## 2015-02-19 NOTE — ED Notes (Signed)
Pt had a nose bleed and then coughed up a bright red blood clot after his nose bleed. Pt has no complaints at this time.

## 2015-02-19 NOTE — ED Provider Notes (Addendum)
CSN: [id number]     Arrival date & time 02/19/15  1017 History   First MD Initiated Contact with Patient 02/19/15 1023     Chief Complaint  Patient presents with  . Hemoptysis     (Consider location/radiation/quality/duration/timing/severity/associated sxs/prior Treatment) HPI  Patient brought by EMS. He coughed up a slight amount of blood this morning he states "about the size of a dime" patient also reports he had a nosebleed from left nostril yesterday which resolved spontaneously. Patient is asymptomatic denies chest pain denies shortness of breath denies other associated symptoms. Treated with oxygen by EMS as he had pulse ox of 87% in the field. Past Medical History  Diagnosis Date  . Coronary artery disease 07/05/2011    2D ECHO - EF ~40%, moderate concentric LV hypertrophy, LA moderately dilated, mild-moderate septal and inferior wall hypokinesis  . Hypertension   . CHF (congestive heart failure) (HCC)   . Diabetes mellitus     Insulin dependent  . Cardiomyopathy (HCC)   . Claudication (HCC) 05/09/2011    Right and left anterior tibial arteries and Left SFA-occluded; Right CIA-<50% diameter reduction; Right Deep Profunda-70-99% diameter reduction; Right SFA->60% diameter reduction; Right Distal Popliteal/Tibial Artery- >60% diameter reduction; Left CFA and Profunda- >50% diameter reduction  . 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 GERD (gastroesophageal reflux disease)   . Peripheral vascular disease (HCC)   . 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 Chronic kidney disease (CKD), stage IV (severe) (HCC)     Dialysis - t/th/Sa   Past Surgical History  Procedure Laterality Date  . Cardiac catheterization  05/07/2008    CABG  . Coronary artery bypass graft   06/2008    x3  . Insertion of dialysis catheter Right 10/01/2014    Procedure: INSERTION OF RIGHT INTERNAL JUGULAR DIALYSIS CATHETER;  Surgeon: Larina Earthly, MD;  Location: [facility] OR;  Service: Vascular;  Laterality: Right;  . Amputation Right 10/06/2014    Procedure: AMPUTATION ABOVE KNEE Right;  Surgeon: Sherren Kerns, MD;  Location: [facility] OR;  Service: Vascular;  Laterality: Right;  . Av fistula placement Left 10/10/2014    Procedure: LEFT BRACHIAL TO CEPHALIC ARTERIOVENOUS (AV) FISTULA CREATION;  Surgeon: Larina Earthly, MD;  Location: [facility] OR;  Service: Vascular;  Laterality: Left;  . Amputation Right 11/26/2014    Procedure: REVISION AMPUTATION ABOVE KNEE;  Surgeon: Sherren Kerns, MD;  Location: [facility] OR;  Service: Vascular;  Laterality: Right;  . Peripheral vascular catheterization N/A 12/12/2014    Procedure: Abdominal Aortogram;  Surgeon: Sherren Kerns, MD;  Location: [facility] INVASIVE CV LAB;  Service: Cardiovascular;  Laterality: N/A;  . Amputation Left 12/31/2014    Procedure: AMPUTATION ABOVE KNEE;  Surgeon: Larina Earthly, MD;  Location: [facility] OR;  Service: Vascular;  Laterality: Left;   Family History  Problem Relation Age of Onset  . Heart disease Mother   . Hypertension Mother   . Diabetes Mother   . Arthritis Sister   . Diabetes Sister    Social History  Substance Use Topics  . Smoking status: Former Smoker -- 0.50 packs/day for 25 years    Types: Cigarettes    Quit date: 09/03/2013  . Smokeless tobacco: Never Used  . Alcohol Use: Yes  Comment: occas.   no alcohol use for 5years. Former illicit drug use are noted 9 years  Review of Systems  HENT: Positive for nosebleeds.   Respiratory:       Hemoptysis  Genitourinary:       Receives hemodialysis Tuesdays Thursdays and Saturdays  Musculoskeletal: Positive for gait problem.       Wheelchair-bound  Skin:       Skin breakdown at buttocks  Hematological: Bruises/bleeds easily.  All other systems reviewed and are  negative.     Allergies  Review of patient's allergies indicates no known allergies.  Home Medications   Prior to Admission medications   Medication Sig Start Date End Date Taking? Authorizing Provider  Amino Acids-Protein Hydrolys (FEEDING SUPPLEMENT, PRO-STAT SUGAR FREE 64,) LIQD Take 30 mLs by mouth 3 (three) times daily with meals. Ok to mixed it out with juice or any other drinks to facilitate intake 01/03/15   Vassie Lollarlos Madera, MD  amiodarone (PACERONE) 400 MG tablet Take 1 tablet (400 mg total) by mouth daily. 01/03/15   Vassie Lollarlos Madera, MD  calcium acetate (PHOSLO) 667 MG capsule Take 2 capsules (1,334 mg total) by mouth 3 (three) times daily with meals. 10/17/14   Albertine GratesFang Xu, MD  Darbepoetin Alfa (ARANESP) 100 MCG/0.5ML SOSY injection Inject 0.5 mLs (100 mcg total) into the vein every Tuesday with hemodialysis. 10/17/14   Albertine GratesFang Xu, MD  docusate sodium (COLACE) 100 MG capsule Take 1 capsule (100 mg total) by mouth 2 (two) times daily. Hold for diarrhea 01/03/15   Vassie Lollarlos Madera, MD  feeding supplement (BOOST / RESOURCE BREEZE) LIQD Take 1 Container by mouth 3 (three) times daily between meals. 01/03/15   Vassie Lollarlos Madera, MD  Ferrous Sulfate (IRON) 325 (65 FE) MG TABS Take 325 mg by mouth daily.    Historical Provider, MD  folic acid (FOLVITE) 1 MG tablet Take 1 tablet (1 mg total) by mouth daily. 10/17/14   Albertine GratesFang Xu, MD  gabapentin (NEURONTIN) 100 MG capsule Take 1 capsule (100 mg total) by mouth 3 (three) times daily. 01/03/15   Vassie Lollarlos Madera, MD  insulin aspart (NOVOLOG) 100 UNIT/ML injection Inject 0-9 Units into the skin 3 (three) times daily with meals. Before each meal 3 times a day, 140-199 - 2 units, 200-250 - 4 units, 251-299 - 6 units,  300-349 - 8 units,  350 or above 10 units. Insulin PEN if approved, provide syringes and needles if needed. Patient taking differently: Inject 0-10 Units into the skin 4 (four) times daily -  with meals and at bedtime. 140-199 - 2 units, 200-250 - 4 units, 251-299  - 6 units,  300-349 - 8 units,  350 or above 10 units. Insulin PEN if approved, provide syringes and needles if needed 10/18/14   Albertine GratesFang Xu, MD  Melatonin 3 MG TABS Take 3 mg by mouth at bedtime.    Historical Provider, MD  midodrine (PROAMATINE) 10 MG tablet Take 10 mg by mouth See admin instructions. Only on Tuesday, Thursday, Saturday with Hemodialysyis    Historical Provider, MD  Multiple Vitamins-Minerals (DECUBI-VITE PO) Take 1 tablet by mouth daily.    Historical Provider, MD  nitroGLYCERIN (NITROSTAT) 0.4 MG SL tablet Place 1 tablet (0.4 mg total) under the tongue once. Patient taking differently: Place 0.4 mg under the tongue every 5 (five) minutes as needed for chest pain.  10/01/13   Mihai Croitoru, MD  Nutritional Supplements (FEEDING SUPPLEMENT, NEPRO CARB STEADY,) LIQD Take 237 mLs by mouth 3 (three) times daily  between meals. 01/03/15   Vassie Loll, MD  oxyCODONE (OXY IR/ROXICODONE) 5 MG immediate release tablet Take 1-2 tablets (5-10 mg total) by mouth every 4 (four) hours as needed for breakthrough pain. 01/27/15   Sharon Seller, NP  OxyCODONE (OXYCONTIN) 15 mg T12A 12 hr tablet Take 1 tablet (15 mg total) by mouth every 8 (eight) hours. 01/03/15   Vassie Loll, MD  pantoprazole (PROTONIX) 40 MG tablet Take 40 mg by mouth daily.    Historical Provider, MD  polyethylene glycol (MIRALAX / GLYCOLAX) packet Take 17 g by mouth daily as needed for moderate constipation. 10/17/14   Albertine Grates, MD  warfarin (COUMADIN) 1 MG tablet Take  daily except for Thursday and Saturday where patient will take 0.5mg  01/04/15   Vassie Loll, MD   BP 138/85 mmHg  Pulse 88  Temp(Src) 98.2 F (36.8 C) (Oral)  Resp 16  SpO2 99% Physical Exam  Constitutional: He is oriented to person, place, and time. No distress.  Chronically ill-appearing  HENT:  Head: Normocephalic and atraumatic.  Eyes: Conjunctivae are normal. Pupils are equal, round, and reactive to light.  Neck: Neck supple. No tracheal  deviation present. No thyromegaly present.  Cardiovascular: Normal rate and regular rhythm.   Pulmonary/Chest: Effort normal and breath sounds normal.  Hemodialysis catheter in place less subclavian area  Abdominal: Soft. Bowel sounds are normal. He exhibits no distension. There is no tenderness.  Musculoskeletal: Normal range of motion. He exhibits no edema or tenderness.  Bilateral BKA  Neurological: He is alert and oriented to person, place, and time. A cranial nerve deficit is present. Coordination normal.  Skin: Skin is warm and dry. No rash noted.  Multiple ecchymoses about extremities. 2 dime-sized superficial decubitus ulcers at buttocks  Psychiatric: He has a normal mood and affect.  Nursing note and vitals reviewed.   ED Course  Procedures (including critical care time) Labs Review Labs Reviewed - No data to display  Imaging Review No results found. I have personally reviewed and evaluated these images and lab results as part of my medical decision-making.   EKG Interpretation None     12:10 PM patient remains asymptomatic. No distress. Chest x-ray viewed by me Results for orders placed or performed during the hospital encounter of 02/19/15  CBC with Differential/Platelet  Result Value Ref Range   WBC 7.0 4.0 - 10.5 K/uL   RBC 3.39 (L) 4.22 - 5.81 MIL/uL   Hemoglobin 9.9 (L) 13.0 - 17.0 g/dL   HCT 16.1 (L) 09.6 - 04.5 %   MCV 93.2 78.0 - 100.0 fL   MCH 29.2 26.0 - 34.0 pg   MCHC 31.3 30.0 - 36.0 g/dL   RDW 40.9 (H) 81.1 - 91.4 %   Platelets 95 (L) 150 - 400 K/uL   Neutrophils Relative % 77 %   Neutro Abs 5.4 1.7 - 7.7 K/uL   Lymphocytes Relative 9 %   Lymphs Abs 0.7 0.7 - 4.0 K/uL   Monocytes Relative 11 %   Monocytes Absolute 0.8 0.1 - 1.0 K/uL   Eosinophils Relative 2 %   Eosinophils Absolute 0.2 0.0 - 0.7 K/uL   Basophils Relative 0 %   Basophils Absolute 0.0 0.0 - 0.1 K/uL  Basic metabolic panel  Result Value Ref Range   Sodium 139 135 - 145 mmol/L    Potassium 5.3 (H) 3.5 - 5.1 mmol/L   Chloride 95 (L) 101 - 111 mmol/L   CO2 25 22 - 32 mmol/L   Glucose,  Bld 129 (H) 65 - 99 mg/dL   BUN 57 (H) 6 - 20 mg/dL   Creatinine, Ser 1.61 (H) 0.61 - 1.24 mg/dL   Calcium 8.7 (L) 8.9 - 10.3 mg/dL   GFR calc non Af Amer 12 (L) >60 mL/min   GFR calc Af Amer 14 (L) >60 mL/min   Anion gap 19 (H) 5 - 15  Protime-INR  Result Value Ref Range   Prothrombin Time 30.5 (H) 11.6 - 15.2 seconds   INR 2.99 (H) 0.00 - 1.49   Dg Chest Port 1 View  02/19/2015  CLINICAL DATA:  Hemoptysis EXAM: PORTABLE CHEST 1 VIEW COMPARISON:  12/08/2014 FINDINGS: Cardiomediastinal silhouette is stable. Status post CABG. Dual lumen right IJ catheter with tip in right atrium is unchanged in position. Again noted poor inspiration 1 basilar atelectasis. No acute infiltrate or pulmonary edema. IMPRESSION: Poor inspiration with mild basilar atelectasis. No acute infiltrate or pulmonary edema. Electronically Signed   By: Natasha Mead M.D.   On: 02/19/2015 11:58     MDM  Spoke with Dr. Briant Cedar plan transfer directly to hemodialysis strongly doubt pulmonary embolism patient denies any shortness of breath or chest pain. He is adequately anticoagulated Diagnoses #1 hemoptysis #2 epistaxis Final diagnoses:  None        Doug Sou, MD 02/19/15 1219  One Percocet order prior to discharge for patient's chronic back pain  Doug Sou, MD 02/19/15 1221

## 2015-02-19 NOTE — ED Notes (Signed)
Pt is in stable condition upon d/c and is escorted to dialysis center via [facility].

## 2015-02-19 NOTE — ED Notes (Signed)
Attempted to call pt dialysis center to ensure pt would still receive dialysis today x2. Unable to contact center. The phone continues to ring.

## 2015-03-08 ENCOUNTER — Encounter (HOSPITAL_COMMUNITY): Payer: Self-pay | Admitting: General Practice

## 2015-03-08 ENCOUNTER — Inpatient Hospital Stay (HOSPITAL_COMMUNITY)
Admission: EM | Admit: 2015-03-08 | Discharge: 2015-03-10 | DRG: 308 | Disposition: A | Discharge status: Hospice | Payer: Medicare Other | Attending: Internal Medicine | Admitting: Internal Medicine

## 2015-03-08 ENCOUNTER — Emergency Department (HOSPITAL_COMMUNITY): Payer: Medicare Other

## 2015-03-08 DIAGNOSIS — K2100 Gastro-esophageal reflux disease with esophagitis, without bleeding: Secondary | ICD-10-CM | POA: Diagnosis present

## 2015-03-08 DIAGNOSIS — R52 Pain, unspecified: Secondary | ICD-10-CM

## 2015-03-08 DIAGNOSIS — Z515 Encounter for palliative care: Secondary | ICD-10-CM | POA: Diagnosis present

## 2015-03-08 DIAGNOSIS — E119 Type 2 diabetes mellitus without complications: Secondary | ICD-10-CM

## 2015-03-08 DIAGNOSIS — I482 Chronic atrial fibrillation: Principal | ICD-10-CM | POA: Diagnosis present

## 2015-03-08 DIAGNOSIS — R4182 Altered mental status, unspecified: Secondary | ICD-10-CM

## 2015-03-08 DIAGNOSIS — N2581 Secondary hyperparathyroidism of renal origin: Secondary | ICD-10-CM | POA: Diagnosis present

## 2015-03-08 DIAGNOSIS — Z992 Dependence on renal dialysis: Secondary | ICD-10-CM

## 2015-03-08 DIAGNOSIS — E46 Unspecified protein-calorie malnutrition: Secondary | ICD-10-CM | POA: Diagnosis present

## 2015-03-08 DIAGNOSIS — I132 Hypertensive heart and chronic kidney disease with heart failure and with stage 5 chronic kidney disease, or end stage renal disease: Secondary | ICD-10-CM | POA: Diagnosis present

## 2015-03-08 DIAGNOSIS — D638 Anemia in other chronic diseases classified elsewhere: Secondary | ICD-10-CM | POA: Diagnosis present

## 2015-03-08 DIAGNOSIS — L89153 Pressure ulcer of sacral region, stage 3: Secondary | ICD-10-CM | POA: Diagnosis present

## 2015-03-08 DIAGNOSIS — Z87891 Personal history of nicotine dependence: Secondary | ICD-10-CM | POA: Diagnosis not present

## 2015-03-08 DIAGNOSIS — Z794 Long term (current) use of insulin: Secondary | ICD-10-CM | POA: Diagnosis not present

## 2015-03-08 DIAGNOSIS — K560 Paralytic ileus: Secondary | ICD-10-CM | POA: Diagnosis present

## 2015-03-08 DIAGNOSIS — G9340 Encephalopathy, unspecified: Secondary | ICD-10-CM | POA: Diagnosis present

## 2015-03-08 DIAGNOSIS — I5022 Chronic systolic (congestive) heart failure: Secondary | ICD-10-CM | POA: Diagnosis present

## 2015-03-08 DIAGNOSIS — N189 Chronic kidney disease, unspecified: Secondary | ICD-10-CM

## 2015-03-08 DIAGNOSIS — N186 End stage renal disease: Secondary | ICD-10-CM | POA: Diagnosis present

## 2015-03-08 DIAGNOSIS — Z6841 Body Mass Index (BMI) 40.0 and over, adult: Secondary | ICD-10-CM | POA: Diagnosis not present

## 2015-03-08 DIAGNOSIS — Z66 Do not resuscitate: Secondary | ICD-10-CM | POA: Diagnosis present

## 2015-03-08 DIAGNOSIS — Z7901 Long term (current) use of anticoagulants: Secondary | ICD-10-CM | POA: Diagnosis not present

## 2015-03-08 DIAGNOSIS — Z7189 Other specified counseling: Secondary | ICD-10-CM | POA: Diagnosis not present

## 2015-03-08 DIAGNOSIS — D696 Thrombocytopenia, unspecified: Secondary | ICD-10-CM | POA: Diagnosis present

## 2015-03-08 DIAGNOSIS — Z89611 Acquired absence of right leg above knee: Secondary | ICD-10-CM

## 2015-03-08 DIAGNOSIS — E1142 Type 2 diabetes mellitus with diabetic polyneuropathy: Secondary | ICD-10-CM | POA: Diagnosis present

## 2015-03-08 DIAGNOSIS — I4891 Unspecified atrial fibrillation: Secondary | ICD-10-CM | POA: Diagnosis not present

## 2015-03-08 DIAGNOSIS — Z89612 Acquired absence of left leg above knee: Secondary | ICD-10-CM

## 2015-03-08 DIAGNOSIS — I255 Ischemic cardiomyopathy: Secondary | ICD-10-CM | POA: Diagnosis present

## 2015-03-08 DIAGNOSIS — Z951 Presence of aortocoronary bypass graft: Secondary | ICD-10-CM | POA: Diagnosis not present

## 2015-03-08 DIAGNOSIS — I509 Heart failure, unspecified: Secondary | ICD-10-CM

## 2015-03-08 DIAGNOSIS — F1290 Cannabis use, unspecified, uncomplicated: Secondary | ICD-10-CM | POA: Diagnosis present

## 2015-03-08 DIAGNOSIS — E1151 Type 2 diabetes mellitus with diabetic peripheral angiopathy without gangrene: Secondary | ICD-10-CM | POA: Diagnosis present

## 2015-03-08 DIAGNOSIS — L98429 Non-pressure chronic ulcer of back with unspecified severity: Secondary | ICD-10-CM | POA: Diagnosis present

## 2015-03-08 DIAGNOSIS — E1122 Type 2 diabetes mellitus with diabetic chronic kidney disease: Secondary | ICD-10-CM | POA: Diagnosis present

## 2015-03-08 DIAGNOSIS — I2510 Atherosclerotic heart disease of native coronary artery without angina pectoris: Secondary | ICD-10-CM | POA: Diagnosis present

## 2015-03-08 DIAGNOSIS — R110 Nausea: Secondary | ICD-10-CM

## 2015-03-08 DIAGNOSIS — Z720 Tobacco use: Secondary | ICD-10-CM | POA: Diagnosis present

## 2015-03-08 LAB — CBC WITH DIFFERENTIAL/PLATELET
BASOS PCT: 0 %
Basophils Absolute: 0 10*3/uL (ref 0.0–0.1)
EOS ABS: 0 10*3/uL (ref 0.0–0.7)
Eosinophils Relative: 0 %
HCT: 39.2 % (ref 39.0–52.0)
Hemoglobin: 12.4 g/dL — ABNORMAL LOW (ref 13.0–17.0)
Lymphocytes Relative: 3 %
Lymphs Abs: 0.4 10*3/uL — ABNORMAL LOW (ref 0.7–4.0)
MCH: 28.8 pg (ref 26.0–34.0)
MCHC: 31.6 g/dL (ref 30.0–36.0)
MCV: 91 fL (ref 78.0–100.0)
MONO ABS: 0.8 10*3/uL (ref 0.1–1.0)
MONOS PCT: 6 %
NEUTROS PCT: 91 %
Neutro Abs: 12.1 10*3/uL — ABNORMAL HIGH (ref 1.7–7.7)
Platelets: 125 10*3/uL — ABNORMAL LOW (ref 150–400)
RBC: 4.31 MIL/uL (ref 4.22–5.81)
RDW: 16.8 % — AB (ref 11.5–15.5)
WBC: 13.3 10*3/uL — ABNORMAL HIGH (ref 4.0–10.5)

## 2015-03-08 LAB — GLUCOSE, CAPILLARY
GLUCOSE-CAPILLARY: 98 mg/dL (ref 65–99)
GLUCOSE-CAPILLARY: 107 mg/dL — AB (ref 65–99)

## 2015-03-08 LAB — COMPREHENSIVE METABOLIC PANEL
ALBUMIN: 2.4 g/dL — AB (ref 3.5–5.0)
ALT: 19 U/L (ref 17–63)
AST: 30 U/L (ref 15–41)
Alkaline Phosphatase: 116 U/L (ref 38–126)
Anion gap: 22 — ABNORMAL HIGH (ref 5–15)
BUN: 44 mg/dL — ABNORMAL HIGH (ref 6–20)
CHLORIDE: 94 mmol/L — AB (ref 101–111)
CO2: 21 mmol/L — AB (ref 22–32)
CREATININE: 4.28 mg/dL — AB (ref 0.61–1.24)
Calcium: 8.4 mg/dL — ABNORMAL LOW (ref 8.9–10.3)
GFR calc non Af Amer: 15 mL/min — ABNORMAL LOW (ref >60)
GFR, EST AFRICAN AMERICAN: 17 mL/min — AB (ref >60)
GLUCOSE: 93 mg/dL (ref 65–99)
Potassium: 4.5 mmol/L (ref 3.5–5.1)
SODIUM: 137 mmol/L (ref 135–145)
Total Bilirubin: 1.3 mg/dL — ABNORMAL HIGH (ref 0.3–1.2)
Total Protein: 6.6 g/dL (ref 6.5–8.1)

## 2015-03-08 LAB — BRAIN NATRIURETIC PEPTIDE: B Natriuretic Peptide: >4500 pg/mL — ABNORMAL HIGH (ref 0.0–100.0)

## 2015-03-08 LAB — PROTIME-INR
INR: 3.12 — AB (ref 0.00–1.49)
PROTHROMBIN TIME: 31.6 s — AB (ref 11.6–15.2)

## 2015-03-08 LAB — VITAMIN B12: Vitamin B-12: 2184 pg/mL — ABNORMAL HIGH (ref 180–914)

## 2015-03-08 LAB — TSH: TSH: 10.704 u[IU]/mL — ABNORMAL HIGH (ref 0.350–4.500)

## 2015-03-08 LAB — MRSA PCR SCREENING: MRSA BY PCR: NEGATIVE

## 2015-03-08 LAB — CBG MONITORING, ED: Glucose-Capillary: 131 mg/dL — ABNORMAL HIGH (ref 65–99)

## 2015-03-08 LAB — AMMONIA: Ammonia: 30 umol/L (ref 9–35)

## 2015-03-08 MED ORDER — DILTIAZEM HCL 100 MG IV SOLR
10.0000 mg/h | Freq: Once | INTRAVENOUS | Status: DC
  Filled 2015-03-08: qty 100

## 2015-03-08 MED ORDER — GABAPENTIN 100 MG PO CAPS
100.0000 mg | ORAL_CAPSULE | Freq: Three times a day (TID) | ORAL | Status: DC
  Administered 2015-03-08 – 2015-03-10 (×5): 100 mg via ORAL
  Filled 2015-03-08 (×5): qty 1

## 2015-03-08 MED ORDER — SODIUM CHLORIDE 0.9 % IV SOLN: 250.0000 mL | INTRAVENOUS | Status: DC | PRN

## 2015-03-08 MED ORDER — WARFARIN - PHARMACIST DOSING INPATIENT: Freq: Every day | Status: DC

## 2015-03-08 MED ORDER — AMIODARONE IV BOLUS ONLY 150 MG/100ML
150.0000 mg | Freq: Once | INTRAVENOUS | Status: AC
  Administered 2015-03-08: 150 mg via INTRAVENOUS
  Filled 2015-03-08: qty 100

## 2015-03-08 MED ORDER — AMIODARONE HCL IN DEXTROSE 360-4.14 MG/200ML-% IV SOLN
60.0000 mg/h | INTRAVENOUS | Status: AC
  Administered 2015-03-08: 60 mg/h via INTRAVENOUS

## 2015-03-08 MED ORDER — DILTIAZEM HCL 25 MG/5ML IV SOLN
10.0000 mg | Freq: Once | INTRAVENOUS | Status: AC
Start: 2015-03-08 — End: 2015-03-08

## 2015-03-08 MED ORDER — NEPRO/CARBSTEADY PO LIQD
237.0000 mL | Freq: Three times a day (TID) | ORAL | Status: DC
  Administered 2015-03-08 – 2015-03-09 (×3): 237 mL via ORAL
  Filled 2015-03-08 (×9): qty 237

## 2015-03-08 MED ORDER — DOCUSATE SODIUM 100 MG PO CAPS
100.0000 mg | ORAL_CAPSULE | Freq: Two times a day (BID) | ORAL | Status: DC
  Administered 2015-03-08 – 2015-03-09 (×2): 100 mg via ORAL
  Filled 2015-03-08 (×2): qty 1

## 2015-03-08 MED ORDER — WARFARIN SODIUM 1 MG PO TABS
1.0000 mg | ORAL_TABLET | Freq: Once | ORAL | Status: AC
  Administered 2015-03-08: 1 mg via ORAL
  Filled 2015-03-08 (×2): qty 1

## 2015-03-08 MED ORDER — SODIUM CHLORIDE 0.9 % IJ SOLN: 3.0000 mL | INTRAMUSCULAR | Status: DC | PRN

## 2015-03-08 MED ORDER — DECUBI-VITE PO CAPS: ORAL_CAPSULE | Freq: Every day | ORAL | Status: DC

## 2015-03-08 MED ORDER — ONDANSETRON HCL 4 MG/2ML IJ SOLN: 4.0000 mg | Freq: Four times a day (QID) | INTRAMUSCULAR | Status: DC | PRN

## 2015-03-08 MED ORDER — PRO-STAT SUGAR FREE PO LIQD
30.0000 mL | Freq: Three times a day (TID) | ORAL | Status: DC
  Administered 2015-03-09: 30 mL via ORAL
  Filled 2015-03-08: qty 30

## 2015-03-08 MED ORDER — OXYCODONE HCL 5 MG PO TABS
10.0000 mg | ORAL_TABLET | Freq: Once | ORAL | Status: AC
  Administered 2015-03-08: 10 mg via ORAL
  Filled 2015-03-08: qty 2

## 2015-03-08 MED ORDER — ONDANSETRON HCL 4 MG PO TABS: 4.0000 mg | ORAL_TABLET | Freq: Four times a day (QID) | ORAL | Status: DC | PRN

## 2015-03-08 MED ORDER — OXYCODONE HCL ER 10 MG PO T12A
20.0000 mg | EXTENDED_RELEASE_TABLET | Freq: Two times a day (BID) | ORAL | Status: DC
  Administered 2015-03-08 – 2015-03-10 (×4): 20 mg via ORAL
  Filled 2015-03-08 (×4): qty 2

## 2015-03-08 MED ORDER — MIDODRINE HCL 5 MG PO TABS: 10.0000 mg | ORAL_TABLET | ORAL | Status: DC

## 2015-03-08 MED ORDER — INSULIN ASPART 100 UNIT/ML ~~LOC~~ SOLN: 0.0000 [IU] | Freq: Every day | SUBCUTANEOUS | Status: DC

## 2015-03-08 MED ORDER — POLYETHYLENE GLYCOL 3350 17 G PO PACK
17.0000 g | PACK | Freq: Every day | ORAL | Status: DC | PRN
  Administered 2015-03-10: 17 g via ORAL
  Filled 2015-03-08: qty 1

## 2015-03-08 MED ORDER — OCUVITE-LUTEIN PO CAPS
1.0000 | ORAL_CAPSULE | Freq: Every day | ORAL | Status: DC
  Administered 2015-03-09: 1 via ORAL
  Filled 2015-03-08: qty 1

## 2015-03-08 MED ORDER — OXYCODONE HCL 5 MG/5ML PO SOLN
5.0000 mg | ORAL | Status: DC | PRN
  Administered 2015-03-09 – 2015-03-10 (×4): 5 mg via ORAL
  Filled 2015-03-08 (×4): qty 5

## 2015-03-08 MED ORDER — PANTOPRAZOLE SODIUM 40 MG PO TBEC
40.0000 mg | DELAYED_RELEASE_TABLET | Freq: Every day | ORAL | Status: DC
  Administered 2015-03-08 – 2015-03-10 (×3): 40 mg via ORAL
  Filled 2015-03-08 (×3): qty 1

## 2015-03-08 MED ORDER — ACETAMINOPHEN 650 MG RE SUPP: 650.0000 mg | Freq: Four times a day (QID) | RECTAL | Status: DC | PRN

## 2015-03-08 MED ORDER — ACETAMINOPHEN 325 MG PO TABS: 650.0000 mg | ORAL_TABLET | Freq: Four times a day (QID) | ORAL | Status: DC | PRN

## 2015-03-08 MED ORDER — SODIUM CHLORIDE 0.9 % IJ SOLN
3.0000 mL | Freq: Two times a day (BID) | INTRAMUSCULAR | Status: DC
Start: 2015-03-08 — End: 2015-03-10
  Administered 2015-03-08 – 2015-03-09 (×3): 3 mL via INTRAVENOUS

## 2015-03-08 MED ORDER — DILTIAZEM HCL 100 MG IV SOLR: 5.0000 mg/h | Freq: Once | INTRAVENOUS | Status: DC

## 2015-03-08 MED ORDER — AMIODARONE HCL IN DEXTROSE 360-4.14 MG/200ML-% IV SOLN
30.0000 mg/h | INTRAVENOUS | Status: DC
  Administered 2015-03-09: 30 mg/h via INTRAVENOUS
  Filled 2015-03-08 (×2): qty 200

## 2015-03-08 MED ORDER — INSULIN ASPART 100 UNIT/ML ~~LOC~~ SOLN: 0.0000 [IU] | Freq: Three times a day (TID) | SUBCUTANEOUS | Status: DC

## 2015-03-08 MED ORDER — FOLIC ACID 1 MG PO TABS
1.0000 mg | ORAL_TABLET | Freq: Every day | ORAL | Status: DC
  Administered 2015-03-09: 1 mg via ORAL
  Filled 2015-03-08: qty 1

## 2015-03-08 MED ORDER — CALCIUM ACETATE (PHOS BINDER) 667 MG PO CAPS
1334.0000 mg | ORAL_CAPSULE | Freq: Three times a day (TID) | ORAL | Status: DC
  Administered 2015-03-09 (×3): 1334 mg via ORAL
  Filled 2015-03-08 (×3): qty 2

## 2015-03-08 MED ORDER — MELATONIN 3 MG PO TABS
3.0000 mg | ORAL_TABLET | Freq: Every day | ORAL | Status: DC
  Administered 2015-03-08 – 2015-03-09 (×2): 3 mg via ORAL
  Filled 2015-03-08 (×3): qty 1

## 2015-03-08 NOTE — ED Notes (Signed)
Pt confused now to place and to situation. Pt stated " I am in a hotel, my girlfriend left to grab some food". RN notified EDP. PT received a phone call to room, and pt appears to be oriented again at this time.

## 2015-03-08 NOTE — Consult Note (Addendum)
ELECTROPHYSIOLOGY CONSULT NOTE  Patient ID: [name], MRN: [id number], DOB/AGE: 07/10/1965 49 y.o. Admit date: 03/08/2015 Date of Consult: 03/08/2015  Primary Physician: [name], NP Primary Cardiologist: MCr  Chief Complaint: atrial fibrillation with RVR   HPI [name] is a 49 y.o. male  Seen at the request of the emergency room and the triad hospitalists because of atrial fibrillation with a rapid rate.  He has a history of ischemic heart disease and underwent bypass surgery 2010. There've been multiple hospitalizations in the interval because of heart failure but because of concerns regarding renal failure (see below) catheterization was deferred.  He declined dialysis for a long time with conversations repeatedly veering towards peritoneal dialysis.. This discussion is outlined in Dr. Regan Lemming note 4/16 with great care and thoroughness.  Ultimately, however, he ended up having a fistula placed in July which has not yet matured and he has been dialyzed T/T/S via a catheter in his right neck. Also in July, he underwent the first of 2 AKA's, the right side having to be revised because of poor healing. He was last seen by vascular surgery in early October at which time healing seen to be progressing. Multiple notes during the summer described  protein calorie malnutrition  He came to the emergency room today because of complaints of pain. This mostly is related to buttocks pain and the PA in the ER has well described ecchymoses and ulcers on his buttocks.  He was noted on arrival to have atrial fibrillation with a rapid rate and amiodarone was initiated.  He has had no palpitations. He has no complaints of dyspnea. There is no chest pain.  He does have a history of arrhythmia. Was seen by Dr. Leonia Reeves in consultation 10/01/14 because of a "wide complex tachycardia night before.'" Notably in epic all tracings from    6/7-6/10 are missing  aMIODARONE and warfarin were initiated at that  time, and he has remained on those since then.  Past Medical History  Diagnosis Date  . Coronary artery disease 07/05/2011    2D ECHO - EF ~40%, moderate concentric LV hypertrophy, LA moderately dilated, mild-moderate septal and inferior wall hypokinesis  . Hypertension   . CHF (congestive heart failure) (HCC)   . Diabetes mellitus     Insulin dependent  . Cardiomyopathy (HCC)   . Claudication (HCC) 05/09/2011    Right and left anterior tibial arteries and Left SFA-occluded; Right CIA-<50% diameter reduction; Right Deep Profunda-70-99% diameter reduction; Right SFA->60% diameter reduction; Right Distal Popliteal/Tibial Artery- >60% diameter reduction; Left CFA and Profunda- >50% diameter reduction  . 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 GERD (gastroesophageal reflux disease)   . Peripheral vascular disease (HCC)   . 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 Chronic kidney disease (CKD), stage IV (severe) (HCC)     Dialysis - t/th/Sa      Surgical History:  Past Surgical History  Procedure Laterality Date  . Cardiac catheterization  05/07/2008    CABG  . Coronary artery bypass graft  06/2008    x3  . Insertion of dialysis catheter Right 10/01/2014    Procedure: INSERTION OF RIGHT INTERNAL JUGULAR DIALYSIS CATHETER;  Surgeon: Larina Earthly, MD;  Location: [facility] OR;  Service: Vascular;  Laterality: Right;  .  Amputation Right 10/06/2014    Procedure: AMPUTATION ABOVE KNEE Right;  Surgeon: Sherren Kerns, MD;  Location: [facility] OR;  Service: Vascular;  Laterality: Right;  . Av fistula placement Left 10/10/2014    Procedure: LEFT BRACHIAL TO CEPHALIC ARTERIOVENOUS (AV) FISTULA CREATION;  Surgeon: Larina Earthly, MD;  Location: [facility] OR;  Service: Vascular;  Laterality: Left;  .  Amputation Right 11/26/2014    Procedure: REVISION AMPUTATION ABOVE KNEE;  Surgeon: Sherren Kerns, MD;  Location: [facility] OR;  Service: Vascular;  Laterality: Right;  . Peripheral vascular catheterization N/A 12/12/2014    Procedure: Abdominal Aortogram;  Surgeon: Sherren Kerns, MD;  Location: [facility] INVASIVE CV LAB;  Service: Cardiovascular;  Laterality: N/A;  . Amputation Left 12/31/2014    Procedure: AMPUTATION ABOVE KNEE;  Surgeon: Larina Earthly, MD;  Location: [facility] OR;  Service: Vascular;  Laterality: Left;     Home Meds: Prior to Admission medications   Medication Sig Start Date End Date Taking? Authorizing Provider  Amino Acids-Protein Hydrolys (FEEDING SUPPLEMENT, PRO-STAT SUGAR FREE 64,) LIQD Take 30 mLs by mouth 3 (three) times daily with meals. Ok to mixed it out with juice or any other drinks to facilitate intake 01/03/15  Yes Vassie Loll, MD  amiodarone (PACERONE) 400 MG tablet Take 1 tablet (400 mg total) by mouth daily. 01/03/15  Yes Vassie Loll, MD  calcium acetate (PHOSLO) 667 MG capsule Take 2 capsules (1,334 mg total) by mouth 3 (three) times daily with meals. 10/17/14  Yes Albertine Grates, MD  Darbepoetin Alfa (ARANESP) 100 MCG/0.5ML SOSY injection Inject 0.5 mLs (100 mcg total) into the vein every Tuesday with hemodialysis. 10/17/14  Yes Albertine Grates, MD  docusate sodium (COLACE) 100 MG capsule Take 1 capsule (100 mg total) by mouth 2 (two) times daily. Hold for diarrhea 01/03/15  Yes Vassie Loll, MD  folic acid (FOLVITE) 1 MG tablet Take 1 tablet (1 mg total) by mouth daily. 10/17/14  Yes Albertine Grates, MD  gabapentin (NEURONTIN) 100 MG capsule Take 1 capsule (100 mg total) by mouth 3 (three) times daily. 01/03/15  Yes Vassie Loll, MD  insulin aspart (NOVOLOG) 100 UNIT/ML injection Inject 0-9 Units into the skin 3 (three) times daily with meals. Before each meal 3 times a day, 140-199 - 2 units, 200-250 - 4 units, 251-299 - 6 units,  300-349 - 8 units,  350 or above 10 units. Insulin PEN if approved,  provide syringes and needles if needed. Patient taking differently: Inject 0-10 Units into the skin 4 (four) times daily -  with meals and at bedtime. 140-199 - 2 units, 200-250 - 4 units, 251-299 - 6 units,  300-349 - 8 units,  350 or above 10 units. Insulin PEN if approved, provide syringes and needles if needed 10/18/14  Yes Albertine Grates, MD  Melatonin 3 MG TABS Take 3 mg by mouth at bedtime.   Yes Historical Provider, MD  midodrine (PROAMATINE) 10 MG tablet Take 10 mg by mouth See admin instructions. Only on Tuesday, Thursday, Saturday with Hemodialysyis   Yes Historical Provider, MD  Multiple Vitamins-Minerals (DECUBI-VITE PO) Take 1 tablet by mouth daily.   Yes Historical Provider, MD  Nutritional Supplements (FEEDING SUPPLEMENT, NEPRO CARB STEADY,) LIQD Take 237 mLs by mouth 3 (three) times daily between meals. 01/03/15  Yes Vassie Loll, MD  oxyCODONE (OXYCONTIN) 20 mg 12 hr tablet Take 20 mg by mouth every 12 (twelve) hours.   Yes Historical Provider, MD  OXYCODONE HCL PO Take 5 mg  by mouth every 4 (four) hours as needed (pain).   Yes Historical Provider, MD  pantoprazole (PROTONIX) 40 MG tablet Take 40 mg by mouth daily.   Yes Historical Provider, MD  polyethylene glycol (MIRALAX / GLYCOLAX) packet Take 17 g by mouth daily as needed for moderate constipation. 10/17/14  Yes Albertine GratesFang Xu, MD  warfarin (COUMADIN) 2 MG tablet Take 2 mg by mouth daily.   Yes Historical Provider, MD  nitroGLYCERIN (NITROSTAT) 0.4 MG SL tablet Place 1 tablet (0.4 mg total) under the tongue once. Patient taking differently: Place 0.4 mg under the tongue every 5 (five) minutes as needed for chest pain.  10/01/13   Mihai Croitoru, MD  OxyCODONE (OXYCONTIN) 15 mg T12A 12 hr tablet Take 1 tablet (15 mg total) by mouth every 8 (eight) hours. 01/03/15   Vassie Lollarlos Madera, MD       Allergies: No Known Allergies  Social History   Social History  . Marital Status: Single    Spouse Name: N/A  . Number of Children: N/A  . Years of  Education: N/A   Occupational History  . Not on file.   Social History Main Topics  . Smoking status: Former Smoker -- 0.50 packs/day for 25 years    Types: Cigarettes    Quit date: 09/03/2013  . Smokeless tobacco: Never Used  . Alcohol Use: Yes     Comment: occas.  . Drug Use: Yes    Special: Marijuana  . Sexual Activity: Yes   Other Topics Concern  . Not on file   Social History Narrative     Family History  Problem Relation Age of Onset  . Heart disease Mother   . Hypertension Mother   . Diabetes Mother   . Arthritis Sister   . Diabetes Sister      ROS:  Please see the history of present illness.     All other systems reviewed and negative.    Physical Exam:   Blood pressure 120/96, pulse 28, temperature 97.8 F (36.6 C), temperature source Oral, resp. rate 18, weight 153 lb (69.4 kg), SpO2 93 %. General: cachectic and emaciated male in no acute distress. Head: Normocephalic, atraumatic, sclera non-icteric, no xanthomas, nares are without discharge. EENT: normal Lymph Nodes:  none Back: without scoliosis/kyphosis  no CVA tendersness Neck: Negative for carotid bruits. JVD 7 Lungs: Clear bilaterally to auscultation without wheezes, rales, or rhonchi. Breathing is unlabored. Barrell chested  Heart:  Rapid and irregular rate and rhythm  with S1 S2.  2/6 systolic  murmur , rubs, or gallops appreciated. Abdomen: Soft, non-tender,  distended with normoactive bowel sounds. No hepatomegaly. No rebound/guarding. No obvious abdominal masses. Msk:  Strength and tone appear normal for age. Extremities: No clubbing or cyanosis.  There is edema on his stop there is erythema on his stump his upper extremities incredibly a seated  Skin: Warm and Dry and multiply tattooed  Neuro: Alert and oriented X 1.. CN III-XII intact Grossly normal sensory and motor function . Psych:  Responds to questions appropriately with a normal affect but confabulates      Labs: Cardiac Enzymes No  results for input(s): CKTOTAL, CKMB, TROPONINI in the last 72 hours. CBC Lab Results  Component Value Date   WBC 13.3* 03/08/2015   HGB 12.4* 03/08/2015   HCT 39.2 03/08/2015   MCV 91.0 03/08/2015   PLT 125* 03/08/2015   PROTIME:  Recent Labs  03/08/15 1326  LABPROT 31.6*  INR 3.12*   Chemistry  Recent  Labs Lab 03/08/15 1133  NA 137  K 4.5  CL 94*  CO2 21*  BUN 44*  CREATININE 4.28*  CALCIUM 8.4*  PROT 6.6  BILITOT 1.3*  ALKPHOS 116  ALT 19  AST 30  GLUCOSE 93   Lipids Lab Results  Component Value Date   CHOL 99 12/03/2014   HDL 40* 12/03/2014   LDLCALC 43 12/03/2014   TRIG 80 12/03/2014   BNP PRO B NATRIURETIC PEPTIDE (BNP)  Date/Time Value Ref Range Status  05/07/2008 06:05 AM 554.0* 0.0 - 100.0 pg/mL Final  05/04/2008 11:55 AM 1105.0* 0.0 - 100.0 pg/mL Final   Thyroid Function Tests: No results for input(s): TSH, T4TOTAL, T3FREE, THYROIDAB in the last 72 hours.  Invalid input(s): FREET3    Miscellaneous No results found for: DDIMER  Radiology/Studies:  Dg Chest 1 View  03/08/2015  CLINICAL DATA:  Altered mental status, end-stage renal disease, hypoxia EXAM: CHEST 1 VIEW COMPARISON:  02/19/2015 FINDINGS: Right IJ dialysis catheter tips SVC RA junction as before. Prior coronary bypass noted. Marked cardiomegaly and low lung volumes with vascular congestion. Basilar atelectasis as before. No significant interval change. No large effusion or pneumothorax. Trachea midline. No acute osseous finding. IMPRESSION: Very low lung volumes with marked cardiomegaly and vascular congestion Basilar atelectasis No significant interval change. Electronically Signed   By: Judie Petit.  Shick M.D.   On: 03/08/2015 12:14   Dg Chest Port 1 View  02/19/2015  CLINICAL DATA:  Hemoptysis EXAM: PORTABLE CHEST 1 VIEW COMPARISON:  12/08/2014 FINDINGS: Cardiomediastinal silhouette is stable. Status post CABG. Dual lumen right IJ catheter with tip in right atrium is unchanged in position.  Again noted poor inspiration 1 basilar atelectasis. No acute infiltrate or pulmonary edema. IMPRESSION: Poor inspiration with mild basilar atelectasis. No acute infiltrate or pulmonary edema. Electronically Signed   By: Natasha Mead M.D.   On: 02/19/2015 11:58    EKG:  Atrial fibrillation with a rapid rate   -/13/45 IVCD-left bundle branch block like but with Q waves in lead 1 and L   Assessment and Plan:  Atrial fibrillation-paroxysmal  Ischemic cardiomyopathy  Congestive heart failure-chronic systolic  End-stage renal disease hemodialysis T/T/S  Decubitus ulcer causing pain  Vascular disease status post bilateral AKA  Protein calorie malnutrition  Confusion question acute versus chronic    The patient has atrial fibrillation. Paroxysmal in the context of his other comorbidities. He is having few symptoms with this. He received intravenous amiodarone with some gradual decrease in heart rate; we will continue his oral amiodarone. He takes ProAmatine on dialysis days his blood pressure is otherwise pretty good. I'm not sure we will get away augmented AV nodal blockade although blood pressures reviewed over the last number of months or mostly in the 120-150 range.  I will add low-dose beta blockers with a plan to hold him on dialysis mornings.  Cardioversion may be our next best efforts although I would like to wait for a day or 2 in the hopes that he would convert himself as he acute stresses of dialysis and pain attenuate and his confusion dissipates  Sherryl Manges

## 2015-03-08 NOTE — ED Notes (Signed)
IV started without difficulty, patient repositioned, continues to moan.

## 2015-03-08 NOTE — ED Provider Notes (Addendum)
CSN: [id number]     Arrival date & time 03/08/15  1040 History   First MD Initiated Contact with Patient 03/08/15 1054     Chief Complaint  Patient presents with  . Altered Mental Status     (Consider location/radiation/quality/duration/timing/severity/associated sxs/prior Treatment) HPI   Patient is a 49 year old male with history of A. Fib (on coumadin), PVD, CAD, substance abuse, HTN, DM, CABG and CKD (on hemodialysis tues, thurs, sat) who presents to the ED via EMS with complaint of AMS. Pt is from [facility] and staff reported to EMS that the pt has been more agitated lately and he had low oxygen readings. Pt reports that he tried to call EMS last night due to worsening pain related to his pressure ulcers but notes the facility would not let him place a call. Patient reports having pain to his lower back, buttocks and right AKA. Denies fever, chills, headache, SOB, CP, abdominal pain, nausea, vomiting, diarrhea, urinary symptoms, numbness, tingling, weakness, syncope.  Past Medical History  Diagnosis Date  . Coronary artery disease 07/05/2011    2D ECHO - EF ~40%, moderate concentric LV hypertrophy, LA moderately dilated, mild-moderate septal and inferior wall hypokinesis  . Hypertension   . CHF (congestive heart failure) (HCC)   . Diabetes mellitus     Insulin dependent  . Cardiomyopathy (HCC)   . Claudication (HCC) 05/09/2011    Right and left anterior tibial arteries and Left SFA-occluded; Right CIA-<50% diameter reduction; Right Deep Profunda-70-99% diameter reduction; Right SFA->60% diameter reduction; Right Distal Popliteal/Tibial Artery- >60% diameter reduction; Left CFA and Profunda- >50% diameter reduction  . 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 GERD (gastroesophageal reflux  disease)   . Peripheral vascular disease (HCC)   . 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 Chronic kidney disease (CKD), stage IV (severe) (HCC)     Dialysis - t/th/Sa   Past Surgical History  Procedure Laterality Date  . Cardiac catheterization  05/07/2008    CABG  . Coronary artery bypass graft  06/2008    x3  . Insertion of dialysis catheter Right 10/01/2014    Procedure: INSERTION OF RIGHT INTERNAL JUGULAR DIALYSIS CATHETER;  Surgeon: Larina Earthly, MD;  Location: [facility] OR;  Service: Vascular;  Laterality: Right;  . Amputation Right 10/06/2014    Procedure: AMPUTATION ABOVE KNEE Right;  Surgeon: Sherren Kerns, MD;  Location: [facility] OR;  Service: Vascular;  Laterality: Right;  . Av fistula placement Left 10/10/2014    Procedure: LEFT BRACHIAL TO CEPHALIC ARTERIOVENOUS (AV) FISTULA CREATION;  Surgeon: Larina Earthly, MD;  Location: [facility] OR;  Service: Vascular;  Laterality: Left;  . Amputation Right 11/26/2014    Procedure: REVISION AMPUTATION ABOVE KNEE;  Surgeon: Sherren Kerns, MD;  Location: [facility] OR;  Service: Vascular;  Laterality: Right;  . Peripheral vascular catheterization N/A 12/12/2014    Procedure: Abdominal Aortogram;  Surgeon: Sherren Kerns, MD;  Location: [facility] INVASIVE CV LAB;  Service: Cardiovascular;  Laterality: N/A;  . Amputation Left 12/31/2014    Procedure: AMPUTATION ABOVE KNEE;  Surgeon: Larina Earthly, MD;  Location: [facility] OR;  Service: Vascular;  Laterality: Left;   Family History  Problem Relation Age of Onset  . Heart disease Mother   . Hypertension Mother   .  Diabetes Mother   . Arthritis Sister   . Diabetes Sister    Social History  Substance Use Topics  . Smoking status: Former Smoker -- 0.50 packs/day for 25 years    Types: Cigarettes    Quit date: 09/03/2013  . Smokeless tobacco: Never Used  . Alcohol Use: Yes     Comment: occas.    Review of Systems  Skin: Positive for wound (pain).      Allergies  Review of patient's  allergies indicates no known allergies.  Home Medications   Prior to Admission medications   Medication Sig Start Date End Date Taking? Authorizing Provider  Amino Acids-Protein Hydrolys (FEEDING SUPPLEMENT, PRO-STAT SUGAR FREE 64,) LIQD Take 30 mLs by mouth 3 (three) times daily with meals. Ok to mixed it out with juice or any other drinks to facilitate intake 01/03/15  Yes Vassie Loll, MD  amiodarone (PACERONE) 400 MG tablet Take 1 tablet (400 mg total) by mouth daily. 01/03/15  Yes Vassie Loll, MD  calcium acetate (PHOSLO) 667 MG capsule Take 2 capsules (1,334 mg total) by mouth 3 (three) times daily with meals. 10/17/14  Yes Albertine Grates, MD  Darbepoetin Alfa (ARANESP) 100 MCG/0.5ML SOSY injection Inject 0.5 mLs (100 mcg total) into the vein every Tuesday with hemodialysis. 10/17/14  Yes Albertine Grates, MD  docusate sodium (COLACE) 100 MG capsule Take 1 capsule (100 mg total) by mouth 2 (two) times daily. Hold for diarrhea 01/03/15  Yes Vassie Loll, MD  folic acid (FOLVITE) 1 MG tablet Take 1 tablet (1 mg total) by mouth daily. 10/17/14  Yes Albertine Grates, MD  gabapentin (NEURONTIN) 100 MG capsule Take 1 capsule (100 mg total) by mouth 3 (three) times daily. 01/03/15  Yes Vassie Loll, MD  insulin aspart (NOVOLOG) 100 UNIT/ML injection Inject 0-9 Units into the skin 3 (three) times daily with meals. Before each meal 3 times a day, 140-199 - 2 units, 200-250 - 4 units, 251-299 - 6 units,  300-349 - 8 units,  350 or above 10 units. Insulin PEN if approved, provide syringes and needles if needed. Patient taking differently: Inject 0-10 Units into the skin 4 (four) times daily -  with meals and at bedtime. 140-199 - 2 units, 200-250 - 4 units, 251-299 - 6 units,  300-349 - 8 units,  350 or above 10 units. Insulin PEN if approved, provide syringes and needles if needed 10/18/14  Yes Albertine Grates, MD  Melatonin 3 MG TABS Take 3 mg by mouth at bedtime.   Yes Historical Provider, MD  midodrine (PROAMATINE) 10 MG tablet Take  10 mg by mouth See admin instructions. Only on Tuesday, Thursday, Saturday with Hemodialysyis   Yes Historical Provider, MD  Multiple Vitamins-Minerals (DECUBI-VITE PO) Take 1 tablet by mouth daily.   Yes Historical Provider, MD  Nutritional Supplements (FEEDING SUPPLEMENT, NEPRO CARB STEADY,) LIQD Take 237 mLs by mouth 3 (three) times daily between meals. 01/03/15  Yes Vassie Loll, MD  oxyCODONE (OXYCONTIN) 20 mg 12 hr tablet Take 20 mg by mouth every 12 (twelve) hours.   Yes Historical Provider, MD  OXYCODONE HCL PO Take 5 mg by mouth every 4 (four) hours as needed (pain).   Yes Historical Provider, MD  pantoprazole (PROTONIX) 40 MG tablet Take 40 mg by mouth daily.   Yes Historical Provider, MD  polyethylene glycol (MIRALAX / GLYCOLAX) packet Take 17 g by mouth daily as needed for moderate constipation. 10/17/14  Yes Albertine Grates, MD  warfarin (COUMADIN) 2 MG tablet  Take 2 mg by mouth daily.   Yes Historical Provider, MD  nitroGLYCERIN (NITROSTAT) 0.4 MG SL tablet Place 1 tablet (0.4 mg total) under the tongue once. Patient taking differently: Place 0.4 mg under the tongue every 5 (five) minutes as needed for chest pain.  10/01/13   Mihai Croitoru, MD  OxyCODONE (OXYCONTIN) 15 mg T12A 12 hr tablet Take 1 tablet (15 mg total) by mouth every 8 (eight) hours. 01/03/15   Vassie Loll, MD   BP 120/96 mmHg  Pulse 28  Temp(Src) 97.8 F (36.6 C) (Oral)  Resp 18  Wt 153 lb (69.4 kg)  SpO2 93% Physical Exam  Constitutional: He is oriented to person, place, and time. He appears well-developed and well-nourished.  Pt moaning while laying in bed.  HENT:  Head: Normocephalic and atraumatic.  Mouth/Throat: Oropharynx is clear and moist. No oropharyngeal exudate.  Eyes: Conjunctivae and EOM are normal. Pupils are equal, round, and reactive to light. Right eye exhibits no discharge. Left eye exhibits no discharge. No scleral icterus.  Neck: Normal range of motion. Neck supple.  Cardiovascular: Normal rate,  regular rhythm, normal heart sounds and intact distal pulses.   Pulmonary/Chest: Effort normal and breath sounds normal. No respiratory distress. He has no wheezes. He has no rales. He exhibits no tenderness.  Hemodialysis catheter in right upper chest  Abdominal: Soft. Bowel sounds are normal. He exhibits no mass. There is no tenderness. There is no rebound and no guarding.  Protuberant abdomen.  Musculoskeletal: He exhibits no edema.  Bilateral AKA. 2+ pitting edema to bilateral AKA.  Lymphadenopathy:    He has no cervical adenopathy.  Neurological: He is alert and oriented to person, place, and time. Coordination normal.  Skin: Skin is warm and dry.  Multiple ecchymoses noted to BUE. 1cm superficial decubitus ulcer at right buttock, 1.5cm superficial decubitus ulcer at left buttock, no surrounding erythema or drainage, no induration or fluctuance. Small open wound at right AKA surgical scar with small amount of brown drainage noted and surrounding erythema.  Nursing note and vitals reviewed.   ED Course  .Critical Care Performed by: Barrett Henle Authorized by: Barrett Henle Total critical care time: 45 minutes Critical care was necessary to treat or prevent imminent or life-threatening deterioration of the following conditions: atrial fibrillation. Critical care was time spent personally by me on the following activities: blood draw for specimens, development of treatment plan with patient or surrogate, discussions with consultants, evaluation of patient's response to treatment, examination of patient, obtaining history from patient or surrogate, ordering and performing treatments and interventions, ordering and review of laboratory studies, ordering and review of radiographic studies, pulse oximetry, re-evaluation of patient's condition and review of old charts (Diltiazem/Amiodarone Drip).   (including critical care time) Labs Review Labs Reviewed  CBC WITH  DIFFERENTIAL/PLATELET - Abnormal; Notable for the following:    WBC 13.3 (*)    Hemoglobin 12.4 (*)    RDW 16.8 (*)    Platelets 125 (*)    Neutro Abs 12.1 (*)    Lymphs Abs 0.4 (*)    All other components within normal limits  COMPREHENSIVE METABOLIC PANEL - Abnormal; Notable for the following:    Chloride 94 (*)    CO2 21 (*)    BUN 44 (*)    Creatinine, Ser 4.28 (*)    Calcium 8.4 (*)    Albumin 2.4 (*)    Total Bilirubin 1.3 (*)    GFR calc non Af Amer 15 (*)  GFR calc Af Amer 17 (*)    Anion gap 22 (*)    All other components within normal limits  BRAIN NATRIURETIC PEPTIDE - Abnormal; Notable for the following:    B Natriuretic Peptide >4500.0 (*)    All other components within normal limits  PROTIME-INR - Abnormal; Notable for the following:    Prothrombin Time 31.6 (*)    INR 3.12 (*)    All other components within normal limits    Imaging Review Dg Chest 1 View  03/08/2015  CLINICAL DATA:  Altered mental status, end-stage renal disease, hypoxia EXAM: CHEST 1 VIEW COMPARISON:  02/19/2015 FINDINGS: Right IJ dialysis catheter tips SVC RA junction as before. Prior coronary bypass noted. Marked cardiomegaly and low lung volumes with vascular congestion. Basilar atelectasis as before. No significant interval change. No large effusion or pneumothorax. Trachea midline. No acute osseous finding. IMPRESSION: Very low lung volumes with marked cardiomegaly and vascular congestion Basilar atelectasis No significant interval change. Electronically Signed   By: Judie Petit.  Shick M.D.   On: 03/08/2015 12:14   I have personally reviewed and evaluated these images and lab results as part of my medical decision-making.   EKG Interpretation   Date/Time:  Sunday March 08 2015 12:57:00 EST Ventricular Rate:  140 PR Interval:    QRS Duration: 133 QT Interval:  352 QTC Calculation: 537 R Axis:   -64 Text Interpretation:  Atrial fibrillation Left bundle branch block A fib  w/ RVR new since  last tracing Confirmed by LITTLE MD, RACHEL [id number]) on  03/08/2015 1:04:40 PM        MDM   Final diagnoses:  Atrial fibrillation, unspecified type (HCC)  CKD (chronic kidney disease), unspecified stage  Altered mental status, unspecified altered mental status type  Congestive heart failure, unspecified congestive heart failure chronicity, unspecified congestive heart failure type [facility])    Patient presents via EMS from rehabilitation facility with reported AMS. On arrival patient is alert and oriented 3. He reports that he was wanting to come to the ED due to pain related to his decubitus ulcers. He denies any other complaints at this time. Patient has an extensive medical history that includes PVD, CAD, HTN, CHF, CKD on hemodialysis, CABG, A. Fib (on coumadin), bilateral AKA and substance abuse.  I contacted pt's nurse at [facility]. She reports that on Tuesday the patient began acting differently and mildly confused. She notes he was lying in his bed moaning and refused to go to hemodialysis treatment. Patient was unable to be scheduled for dialysis on Wednesday which resulted in him getting his next treatment at his scheduled appointment on Thursday. Nurse states patient has also been refusing wound care. She notes yesterday the patient would only take his pain medicine and Coumadin and spelled the rest of his medicine on the floor of his room. She states the wound nurse noted that his ischial wound appeared red and firm and she was concerned for possible abscess. Nurse reports pt's last INR on 11/11 was 2.5.   Once pt returned from CXR, HR 140, afib with LBBB. Diltiazem  given. Pt reports he took all of his prescribed medications this morning. HR continues to remain in 130s. Diltiazem drip started at 10 mg. HR remained in the 120s. Cardiology consulted, advise to d/c diltiazem drip and give Amiodarone bolus and drip. Consulted hospitalist, agree to admission. Orders placed for  step-down bed. Discussed results and plan for admission with patient.   Barrett Henle, PA-C  03/08/15 1634  Laurence Spatesachel Morgan Little, MD 03/08/15 1639  Barrett HenleNicole Elizabeth Nadeau, PA-C 03/08/15 1640  Laurence Spatesachel Morgan Little, MD August 16, 2014 [phone]

## 2015-03-08 NOTE — H&P (Signed)
[facility] History and Physical  [name] ZOX:[id number] DOB: 1966/03/28 DOA: 03/08/2015  Referring physician: Maryelizabeth Green PCP: [name] Severin, NP   Chief Complaint: pain/ams  HPI: [name] is a 49 y.o. male with past medical history that includes A. Fib on Coumadin, PVD status post bilateral above-the-knee amputations, CAD, substance abuse, hypertension, diabetes, chronic kidney disease on hemodialysis Tuesday Thursday and Saturday who presents to the ED via EMS from a facility with the chief complaint of altered mental status. Initial evaluation in the emergency department review reveals A. Fib with rapid ventricular response, mild leukocytosis   Staff at the facility reported that patient had been more agitated and he had low oxygen readings. No report of recent fever chills nausea vomiting diarrhea. Patient denies any chest pain palpitations headache syncope or near-syncope. He denies any shortness of breath cough abdominal pain. He does report worsening pain related to his pressure ulcer. He recalls calling EMS as his pain was not controlled at the facility.  Workup in the emergency department includes comprehensive metabolic panel significant for chloride of 94 CO2 of 21 creatinine of 4.28 which appears to be above his baseline, complete blood count mild leukocytosis 13.3 hemoglobin of 12.4 platelets 125, INR 3.12. BNP greater than 4500. Chest x-ray very low lung volumes with marked cardiomegaly and vascular congestion. Basilar atelectasis. No significant interval change. EKG with A. Fib and RVR.  In the emergency department he is afebrile hemodynamically stable and not hypoxic. Amiodarone drip was initiated and cardiology is called for consultation.    Review of Systems:  10 point review of systems complete and all systems are negative except as indicated in the history of present illness  Past Medical History  Diagnosis Date  . Coronary artery disease 07/05/2011    2D ECHO -  EF ~40%, moderate concentric LV hypertrophy, LA moderately dilated, mild-moderate septal and inferior wall hypokinesis  . Hypertension   . CHF (congestive heart failure) (HCC)   . Diabetes mellitus     Insulin dependent  . Cardiomyopathy (HCC)   . Claudication (HCC) 05/09/2011    Right and left anterior tibial arteries and Left SFA-occluded; Right CIA-<50% diameter reduction; Right Deep Profunda-70-99% diameter reduction; Right SFA->60% diameter reduction; Right Distal Popliteal/Tibial Artery- >60% diameter reduction; Left CFA and Profunda- >50% diameter reduction  . 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 GERD (gastroesophageal reflux disease)   . Peripheral vascular disease (HCC)   . 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 Chronic kidney disease (CKD), stage IV (severe) (HCC)     Dialysis - t/th/Sa   Past Surgical History  Procedure Laterality Date  . Cardiac catheterization  05/07/2008    CABG  . Coronary artery bypass graft  06/2008    x3  . Insertion of dialysis catheter Right 10/01/2014    Procedure: INSERTION OF RIGHT INTERNAL JUGULAR DIALYSIS CATHETER;  Surgeon: Larina Earthly, MD;  Location: [facility] OR;  Service: Vascular;  Laterality: Right;  . Amputation Right 10/06/2014    Procedure: AMPUTATION ABOVE KNEE Right;  Surgeon: Sherren Kerns, MD;  Location: [facility] OR;  Service: Vascular;  Laterality: Right;  . Av fistula placement Left 10/10/2014    Procedure: LEFT BRACHIAL TO CEPHALIC ARTERIOVENOUS (AV) FISTULA CREATION;  Surgeon: Larina Earthly, MD;  Location:  [facility] OR;  Service: Vascular;  Laterality: Left;  . Amputation Right 11/26/2014    Procedure: REVISION AMPUTATION ABOVE KNEE;  Surgeon: Sherren Kerns, MD;  Location: [facility] OR;  Service: Vascular;  Laterality: Right;  .  Peripheral vascular catheterization N/A 12/12/2014    Procedure: Abdominal Aortogram;  Surgeon: Sherren Kerns, MD;  Location: [facility] INVASIVE CV LAB;  Service: Cardiovascular;  Laterality: N/A;  . Amputation Left 12/31/2014    Procedure: AMPUTATION ABOVE KNEE;  Surgeon: Larina Earthly, MD;  Location: [facility] OR;  Service: Vascular;  Laterality: Left;   Social History:  reports that he quit smoking about 18 months ago. His smoking use included Cigarettes. He has a 12.5 pack-year smoking history. He has never used smokeless tobacco. He reports that he drinks alcohol. He reports that he uses illicit drugs (Marijuana).  No Known Allergies  Family History  Problem Relation Age of Onset  . Heart disease Mother   . Hypertension Mother   . Diabetes Mother   . Arthritis Sister   . Diabetes Sister      Prior to Admission medications   Medication Sig Start Date End Date Taking? Authorizing Provider  Amino Acids-Protein Hydrolys (FEEDING SUPPLEMENT, PRO-STAT SUGAR FREE 64,) LIQD Take 30 mLs by mouth 3 (three) times daily with meals. Ok to mixed it out with juice or any other drinks to facilitate intake 01/03/15  Yes Vassie Loll, MD  amiodarone (PACERONE) 400 MG tablet Take 1 tablet (400 mg total) by mouth daily. 01/03/15  Yes Vassie Loll, MD  calcium acetate (PHOSLO) 667 MG capsule Take 2 capsules (1,334 mg total) by mouth 3 (three) times daily with meals. 10/17/14  Yes Albertine Grates, MD  Darbepoetin Alfa (ARANESP) 100 MCG/0.5ML SOSY injection Inject 0.5 mLs (100 mcg total) into the vein every Tuesday with hemodialysis. 10/17/14  Yes Albertine Grates, MD  docusate sodium (COLACE) 100 MG capsule Take 1 capsule (100 mg total) by mouth 2 (two) times daily. Hold for diarrhea 01/03/15  Yes Vassie Loll, MD  folic acid (FOLVITE) 1 MG tablet Take 1 tablet (1 mg total) by mouth daily. 10/17/14  Yes Albertine Grates, MD  gabapentin (NEURONTIN) 100 MG capsule Take 1 capsule (100 mg total) by mouth 3 (three) times daily. 01/03/15  Yes Vassie Loll,  MD  insulin aspart (NOVOLOG) 100 UNIT/ML injection Inject 0-9 Units into the skin 3 (three) times daily with meals. Before each meal 3 times a day, 140-199 - 2 units, 200-250 - 4 units, 251-299 - 6 units,  300-349 - 8 units,  350 or above 10 units. Insulin PEN if approved, provide syringes and needles if needed. Patient taking differently: Inject 0-10 Units into the skin 4 (four) times daily -  with meals and at bedtime. 140-199 - 2 units, 200-250 - 4 units, 251-299 - 6 units,  300-349 - 8 units,  350 or above 10 units. Insulin PEN if approved, provide syringes and needles if needed 10/18/14  Yes Albertine Grates, MD  Melatonin 3 MG TABS Take 3 mg by mouth at bedtime.   Yes Historical Provider, MD  midodrine (PROAMATINE) 10 MG tablet Take 10 mg by mouth See admin instructions. Only on Tuesday, Thursday, Saturday with Hemodialysyis   Yes Historical Provider, MD  Multiple Vitamins-Minerals (DECUBI-VITE PO) Take 1 tablet by mouth daily.   Yes Historical Provider, MD  Nutritional Supplements (FEEDING SUPPLEMENT, NEPRO CARB STEADY,) LIQD Take 237 mLs by mouth 3 (three) times daily between meals. 01/03/15  Yes Vassie Loll, MD  oxyCODONE (OXYCONTIN) 20 mg 12 hr tablet Take 20 mg by mouth every 12 (twelve) hours.   Yes Historical Provider, MD  OXYCODONE HCL PO Take 5 mg by mouth every 4 (four) hours as needed (pain).   Yes Historical Provider, MD  pantoprazole (PROTONIX) 40 MG tablet Take 40 mg by mouth daily.   Yes Historical Provider, MD  polyethylene glycol (MIRALAX / GLYCOLAX) packet Take 17 g by mouth daily as needed for moderate constipation. 10/17/14  Yes Albertine Grates, MD  warfarin (COUMADIN) 2 MG tablet Take 2 mg by mouth daily.   Yes Historical Provider, MD  nitroGLYCERIN (NITROSTAT) 0.4 MG SL tablet Place 1 tablet (0.4 mg total) under the tongue once. Patient taking differently: Place 0.4 mg under the tongue every 5 (five) minutes as needed for chest pain.  10/01/13   Mihai Croitoru, MD  OxyCODONE (OXYCONTIN) 15 mg  T12A 12 hr tablet Take 1 tablet (15 mg total) by mouth every 8 (eight) hours. 01/03/15   Vassie Loll, MD   Physical Exam: Filed Vitals:   03/08/15 1100 03/08/15 1115 03/08/15 1230 03/08/15 1245  BP: 132/91 143/91 153/100 120/96  Pulse: 83 85 28   Temp:      TempSrc:      Resp:      Weight:      SpO2: 94% 91% 93%     Wt Readings from Last 3 Encounters:  03/08/15 69.4 kg (153 lb)  01/27/15 66.225 kg (146 lb)  01/19/15 67.586 kg (149 lb)    General:  Appears calm and comfortable and chronically ill Eyes: PERRL, normal lids, irises & conjunctiva ENT: grossly normal hearing, lips & tongue, report dentition Neck: no LAD, masses or thyromegaly Cardiovascular: irregularly irregular, no m/r/g. bilateral above the knee amputation with some edema and erythema to both stumps.   Respiratory: CTA bilaterally, no w/r/r. Normal respiratory effort. Abdomen:  protrudent slightly tense sluggish bowel sounds no guarding or rebounding ntnd Skin: multiple ecchymosis on upper extremities. He has decubitus on the right by Dr. As well as left by doc dressing dry and intact no visible drainage. Dressing to right above-the-knee amputation stump small amount of purulent drainage. Skin with erythema no real heat Musculoskeletal: grossly normal tone BUE/BLE Psychiatric: grossly normal mood and affect, speech fluent and appropriate Neurologic: grossly non-focal. Speech slow and deliberate oriented to self only.          Labs on Admission:  Basic Metabolic Panel:  Recent Labs Lab 03/08/15 1133  NA 137  K 4.5  CL 94*  CO2 21*  GLUCOSE 93  BUN 44*  CREATININE 4.28*  CALCIUM 8.4*   Liver Function Tests:  Recent Labs Lab 03/08/15 1133  AST 30  ALT 19  ALKPHOS 116  BILITOT 1.3*  PROT 6.6  ALBUMIN 2.4*   No results for input(s): LIPASE, AMYLASE in the last 168 hours. No results for input(s): AMMONIA in the last 168 hours. CBC:  Recent Labs Lab 03/08/15 1133  WBC 13.3*  NEUTROABS  12.1*  HGB 12.4*  HCT 39.2  MCV 91.0  PLT 125*   Cardiac Enzymes: No results for input(s): CKTOTAL, CKMB, CKMBINDEX, TROPONINI in the last 168 hours.  BNP (last 3 results)  Recent Labs  09/25/14 0410 03/08/15 1326  BNP 2227.0* >4500.0*    ProBNP (last 3 results) No results for input(s): PROBNP in the last 8760 hours.  CBG: No results for input(s): GLUCAP in the last 168 hours.  Radiological Exams on Admission: Dg Chest 1 View  03/08/2015  CLINICAL DATA:  Altered mental status, end-stage renal disease, hypoxia EXAM: CHEST 1 VIEW COMPARISON:  02/19/2015 FINDINGS: Right IJ dialysis catheter tips SVC RA junction as before. Prior coronary bypass noted. Marked cardiomegaly and low lung volumes with vascular congestion. Basilar atelectasis as before. No significant interval change. No large effusion or pneumothorax. Trachea midline. No acute osseous finding. IMPRESSION: Very low lung volumes with marked cardiomegaly and vascular congestion Basilar atelectasis No significant interval change. Electronically Signed   By: Judie PetitM.  Shick M.D.   On: 03/08/2015 12:14    EKG: Independently reviewed as noted above  Assessment/Plan Principal Problem:   Atrial fibrillation with rapid ventricular response (HCC) Active Problems:   Tobacco abuse   CAD (coronary artery disease)   Thrombocytopenia-chronic   Gastroesophageal reflux disease with esophagitis   CAD in native artery   Diabetes type 2, controlled (HCC)   Stage 3 skin ulcer of sacral region (HCC)   Protein-calorie malnutrition (HCC)   CKD (chronic kidney disease) stage V requiring chronic dialysis (HCC)   A-fib (HCC)   Acute encephalopathy    #1. Atrial fibrillation with rapid ventricular response. History of same. He is on amiodarone at home. Evaluated by cardiology in the emergency department. Appreciate assistance -Admit to step down -continue amiodarone drip per cardiology. -not a good candidate for cardioversion. - transition  to oral per cardiology  #2. Acute encephalopathy. Patient is alert oriented to self and to place intermittently. He has a mild leukocytosis but is afebrile and nontoxic appearing. Denies making any urine at all. Chest x-ray without infiltrate. Electrolytes appear to be close to baseline.may need dialysis -We will check ammonia level, B-12 folate RPR -Some question as to his dialysis schedule. Will call nephrology to evaluate for need for dialysis.  #3. Stage III skin ulcer bilateral Buttocks and sacrum as well as right stop. -sites do not appear infected. Maryclare Labrador-We'll request wound consult for recommendations  #4. Chronic kidney disease stage V requiring dialysis. Some question as to his dialysis schedule. I wonder if he could use an extra dialysis session given his chest x-ray and abdominal exam. -Current creatinine is 4.2 which appears to be a little above his baseline. -Will request nephrology consult for evaluation and dialysis scheduling  #5. Diabetes type 2. Fair control -Obtain a hemoglobin A1c -Sliding scale insulin for optimal control -Car modified diet  #6 CAD. History of ischemic heart disease status post bypass surgery 2010. patient denies chest pain. EKG as noted above. -monitor on stepdown -continue home meds  #7. Thrombocytopenia.  Somewhat chronic.no history noted of cirrhosis. Complete metabolic panel with liver function tests within the limits of normal. Total bili 1.3. No obvious signs symptoms of bleeding.   Dr Graciela HusbandsKlein  Cardiology Dr Kathrene [facility]ongoG[facility]   Code Status: full DVT Prophylaxis: Family Communication: none present Disposition Plan: back to facility  Time spent: 75 minutes  [facility] Drive Surgery Center LLCBLACK,[name] M [facility]    I have evaluated the patient, reviewed the chart, modified the above note and discussed the plan with Toya SmothersKaren Black, NP.  Chronic A-fib being admitted with RVR currently on amiodarone infusion per cardiology. Also on Midodrine. Check thyroid  functions. Coumadin to be resumed per pharmacy- INR mildly elevated today. Abdomen quite distended and dull to percussion. High suspicion for ascites. Due to mild confusion, checking Ammonia level. Will obtain ultrasound to look for cirrhosis and ascites.    Calvert CantorSaima Luvina Poirier, MD Pager: [email or web address]

## 2015-03-08 NOTE — ED Notes (Signed)
[facility] Staff reporting pt has been more agitated lately, and pt had a low SpO2 reading. Pt reporting that his facility is not taking care of him, and they let him suffer in pain. Pt has bilateral AKA. Pt has a history of PVD, CAD and substance abuse. Pt is a HD patient and goes on Tu, Th, and Sat. Per EMS staff reporting that they suspect pts family giving pt IV pain medication. Pt has 3 pressure ulcers on his bottom covered by foam dressings, and bilateral arm abrasions and lacerations.

## 2015-03-08 NOTE — ED Notes (Signed)
Pt HR got up to 148 in atrial Fib. EKG obtained and MD notified. IV team has been consulted to obtain IV.

## 2015-03-08 NOTE — Progress Notes (Signed)
ANTICOAGULATION CONSULT NOTE - Initial Consult  Pharmacy Consult for coumadin Indication: atrial fibrillation  No Known Allergies  Patient Measurements: Weight: 153 lb (69.4 kg) Heparin Dosing Weight:  Vital Signs: Temp: 97.8 F (36.6 C) (11/13 1049) Temp Source: Oral (11/13 1049) BP: 120/96 mmHg (11/13 1245) Pulse Rate: 28 (11/13 1230)  Labs:  Recent Labs  03/08/15 1133 03/08/15 1326  HGB 12.4*  --   HCT 39.2  --   PLT 125*  --   LABPROT  --  31.6*  INR  --  3.12*  CREATININE 4.28*  --     Estimated Creatinine Clearance: 20.3 mL/min (by C-G formula based on Cr of 4.28).   Medical History: Past Medical History  Diagnosis Date  . Coronary artery disease 07/05/2011    2D ECHO - EF ~40%, moderate concentric LV hypertrophy, LA moderately dilated, mild-moderate septal and inferior wall hypokinesis  . Hypertension   . CHF (congestive heart failure) (HCC)   . Diabetes mellitus     Insulin dependent  . Cardiomyopathy (HCC)   . Claudication (HCC) 05/09/2011    Right and left anterior tibial arteries and Left SFA-occluded; Right CIA-<50% diameter reduction; Right Deep Profunda-70-99% diameter reduction; Right SFA->60% diameter reduction; Right Distal Popliteal/Tibial Artery- >60% diameter reduction; Left CFA and Profunda- >50% diameter reduction  . 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 GERD (gastroesophageal reflux disease)   . Peripheral vascular disease (HCC)   . 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 Chronic kidney disease (CKD), stage IV (severe) (HCC)     Dialysis - t/th/Sa    Medications:  Scheduled:  . calcium acetate  1,334 mg Oral TID WC  . DECUBI-VITE   Oral Daily  . docusate sodium  100 mg Oral BID  . feeding supplement (NEPRO CARB  STEADY)  237 mL Oral TID BM  . feeding supplement (PRO-STAT SUGAR FREE 64)  30 mL Oral TID WC  . folic acid  1 mg Oral Daily  . gabapentin  100 mg Oral TID  . insulin aspart  0-15 Units Subcutaneous TID WC  . insulin aspart  0-5 Units Subcutaneous QHS  . Melatonin  3 mg Oral QHS  . midodrine  10 mg Oral See admin instructions  . oxyCODONE  20 mg Oral Q12H  . pantoprazole  40 mg Oral Daily  . sodium chloride  3 mL Intravenous Q12H   Infusions:  . sodium chloride    . amiodarone    . amiodarone 60 mg/hr (03/08/15 1602)    Assessment: 49 yo with MMP presented to the ED with AMS. He has been on coumadin for afib. His INR is slightly supratherapeutic today. Coumadin has been ordered to be cont here.   PTA coumadin 2mg  PO qday  Goal of Therapy:  INR 2-3 Monitor platelets by anticoagulation protocol: Yes   Plan:   Coumadin 1mg  PO x1  Daily INR  Ulyses SouthwardMinh Briseidy Spark, PharmD Pager: [phone] 03/08/2015 5:12 PM

## 2015-03-09 ENCOUNTER — Inpatient Hospital Stay (HOSPITAL_COMMUNITY): Payer: Medicare Other

## 2015-03-09 DIAGNOSIS — Z7189 Other specified counseling: Secondary | ICD-10-CM

## 2015-03-09 DIAGNOSIS — Z89612 Acquired absence of left leg above knee: Secondary | ICD-10-CM

## 2015-03-09 DIAGNOSIS — G9340 Encephalopathy, unspecified: Secondary | ICD-10-CM

## 2015-03-09 DIAGNOSIS — Z992 Dependence on renal dialysis: Secondary | ICD-10-CM

## 2015-03-09 DIAGNOSIS — Z515 Encounter for palliative care: Secondary | ICD-10-CM

## 2015-03-09 DIAGNOSIS — Z89611 Acquired absence of right leg above knee: Secondary | ICD-10-CM

## 2015-03-09 DIAGNOSIS — I2510 Atherosclerotic heart disease of native coronary artery without angina pectoris: Secondary | ICD-10-CM

## 2015-03-09 LAB — FOLATE RBC
Folate, RBC: >2033 ng/mL (ref >498)
Hematocrit: 30.5 % — ABNORMAL LOW (ref 37.5–51.0)

## 2015-03-09 LAB — BASIC METABOLIC PANEL
ANION GAP: 22 — AB (ref 5–15)
BUN: 53 mg/dL — AB (ref 6–20)
CHLORIDE: 95 mmol/L — AB (ref 101–111)
CO2: 19 mmol/L — ABNORMAL LOW (ref 22–32)
Calcium: 7.9 mg/dL — ABNORMAL LOW (ref 8.9–10.3)
Creatinine, Ser: 4.58 mg/dL — ABNORMAL HIGH (ref 0.61–1.24)
GFR, EST AFRICAN AMERICAN: 16 mL/min — AB (ref >60)
GFR, EST NON AFRICAN AMERICAN: 14 mL/min — AB (ref >60)
Glucose, Bld: 118 mg/dL — ABNORMAL HIGH (ref 65–99)
POTASSIUM: 4.3 mmol/L (ref 3.5–5.1)
SODIUM: 136 mmol/L (ref 135–145)

## 2015-03-09 LAB — CBC
HCT: 35.9 % — ABNORMAL LOW (ref 39.0–52.0)
HEMATOCRIT: 33.9 % — AB (ref 39.0–52.0)
HEMOGLOBIN: 10.6 g/dL — AB (ref 13.0–17.0)
Hemoglobin: 11.2 g/dL — ABNORMAL LOW (ref 13.0–17.0)
MCH: 28.4 pg (ref 26.0–34.0)
MCH: 28.1 pg (ref 26.0–34.0)
MCHC: 31.3 g/dL (ref 30.0–36.0)
MCHC: 31.2 g/dL (ref 30.0–36.0)
MCV: 90.9 fL (ref 78.0–100.0)
MCV: 90.2 fL (ref 78.0–100.0)
Platelets: 135 10*3/uL — ABNORMAL LOW (ref 150–400)
Platelets: 138 K/uL — ABNORMAL LOW (ref 150–400)
RBC: 3.73 MIL/uL — AB (ref 4.22–5.81)
RBC: 3.98 MIL/uL — ABNORMAL LOW (ref 4.22–5.81)
RDW: 16.8 % — ABNORMAL HIGH (ref 11.5–15.5)
RDW: 16.6 % — ABNORMAL HIGH (ref 11.5–15.5)
WBC: 14.2 10*3/uL — AB (ref 4.0–10.5)
WBC: 16.8 10*3/uL — ABNORMAL HIGH (ref 4.0–10.5)

## 2015-03-09 LAB — GLUCOSE, CAPILLARY
GLUCOSE-CAPILLARY: 120 mg/dL — AB (ref 65–99)
GLUCOSE-CAPILLARY: 107 mg/dL — AB (ref 65–99)
Glucose-Capillary: 110 mg/dL — ABNORMAL HIGH (ref 65–99)
Glucose-Capillary: 106 mg/dL — ABNORMAL HIGH (ref 65–99)

## 2015-03-09 LAB — RENAL FUNCTION PANEL
Albumin: 2.3 g/dL — ABNORMAL LOW (ref 3.5–5.0)
Anion gap: 22 — ABNORMAL HIGH (ref 5–15)
BUN: 60 mg/dL — ABNORMAL HIGH (ref 6–20)
CO2: 21 mmol/L — ABNORMAL LOW (ref 22–32)
Calcium: 7.7 mg/dL — ABNORMAL LOW (ref 8.9–10.3)
Chloride: 94 mmol/L — ABNORMAL LOW (ref 101–111)
Creatinine, Ser: 4.9 mg/dL — ABNORMAL HIGH (ref 0.61–1.24)
GFR calc Af Amer: 15 mL/min — ABNORMAL LOW (ref >60)
GFR calc non Af Amer: 13 mL/min — ABNORMAL LOW (ref >60)
Glucose, Bld: 136 mg/dL — ABNORMAL HIGH (ref 65–99)
Phosphorus: 11.6 mg/dL — ABNORMAL HIGH (ref 2.5–4.6)
Potassium: 4.4 mmol/L (ref 3.5–5.1)
Sodium: 137 mmol/L (ref 135–145)

## 2015-03-09 LAB — T4, FREE: FREE T4: 0.45 ng/dL — AB (ref 0.61–1.12)

## 2015-03-09 LAB — PROTIME-INR
INR: 3.23 — AB (ref 0.00–1.49)
Prothrombin Time: 32.3 seconds — ABNORMAL HIGH (ref 11.6–15.2)

## 2015-03-09 LAB — RPR: RPR Ser Ql: NONREACTIVE

## 2015-03-09 MED ORDER — HYDROMORPHONE HCL 1 MG/ML IJ SOLN
0.5000 mg | Freq: Once | INTRAMUSCULAR | Status: AC
  Administered 2015-03-09: 0.5 mg via INTRAVENOUS
  Filled 2015-03-09: qty 1

## 2015-03-09 MED ORDER — HEPARIN SODIUM (PORCINE) 1000 UNIT/ML DIALYSIS
1000.0000 [IU] | INTRAMUSCULAR | Status: DC | PRN
  Filled 2015-03-09: qty 1

## 2015-03-09 MED ORDER — PENTAFLUOROPROP-TETRAFLUOROETH EX AERO
1.0000 | INHALATION_SPRAY | CUTANEOUS | Status: DC | PRN
Start: 2015-03-09 — End: 2015-03-10

## 2015-03-09 MED ORDER — HYDROMORPHONE HCL 1 MG/ML IJ SOLN
1.0000 mg | INTRAMUSCULAR | Status: DC | PRN
  Administered 2015-03-09 (×2): 1 mg via INTRAVENOUS
  Filled 2015-03-09 (×2): qty 1

## 2015-03-09 MED ORDER — OCUVITE-LUTEIN PO CAPS
1.0000 | ORAL_CAPSULE | Freq: Every day | ORAL | Status: DC
  Filled 2015-03-09: qty 1

## 2015-03-09 MED ORDER — AMIODARONE HCL 200 MG PO TABS
200.0000 mg | ORAL_TABLET | Freq: Two times a day (BID) | ORAL | Status: DC
  Administered 2015-03-09 – 2015-03-10 (×3): 200 mg via ORAL
  Filled 2015-03-09 (×2): qty 1

## 2015-03-09 MED ORDER — ALTEPLASE 2 MG IJ SOLR
2.0000 mg | Freq: Once | INTRAMUSCULAR | Status: DC | PRN
  Filled 2015-03-09: qty 2

## 2015-03-09 MED ORDER — LIDOCAINE HCL (PF) 1 % IJ SOLN: 5.0000 mL | INTRAMUSCULAR | Status: DC | PRN

## 2015-03-09 MED ORDER — LIDOCAINE-PRILOCAINE 2.5-2.5 % EX CREA
1.0000 | TOPICAL_CREAM | CUTANEOUS | Status: DC | PRN
Start: 2015-03-09 — End: 2015-03-10
  Filled 2015-03-09: qty 5

## 2015-03-09 MED ORDER — SODIUM CHLORIDE 0.9 % IV SOLN: 100.0000 mL | INTRAVENOUS | Status: DC | PRN

## 2015-03-09 MED ORDER — DOCUSATE SODIUM 100 MG PO CAPS
200.0000 mg | ORAL_CAPSULE | Freq: Two times a day (BID) | ORAL | Status: DC
  Administered 2015-03-09 – 2015-03-10 (×3): 200 mg via ORAL
  Filled 2015-03-09 (×2): qty 2

## 2015-03-09 MED ORDER — HYDROMORPHONE HCL 1 MG/ML IJ SOLN
1.0000 mg | INTRAMUSCULAR | Status: DC | PRN
Start: 2015-03-09 — End: 2015-03-10
  Administered 2015-03-09 – 2015-03-10 (×6): 1 mg via INTRAVENOUS
  Filled 2015-03-09 (×6): qty 1

## 2015-03-09 MED ORDER — METHIMAZOLE 10 MG PO TABS
20.0000 mg | ORAL_TABLET | Freq: Two times a day (BID) | ORAL | Status: DC
  Filled 2015-03-09 (×2): qty 2

## 2015-03-09 NOTE — Consult Note (Signed)
Consultation Note Date: 03/09/2015   Patient Name: [name]  DOB: [birth year]  MRN: [id number]  Age / Sex: 49 y.o., male  PCP: Imagene Riches, NP Referring Physician: Thurnell Lose, MD  Reason for Consultation: Disposition, Hospice Evaluation, Non pain symptom management and Pain control  Clinical Assessment/Narrative: Thorin Starner is a 49 y.o. 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   Found in be in rapid Afib with RVR, started on amiodarone, cardiology was consulted. Patient also had mild leukocytosis but was afebrile. CXR showed marked cardiomegaly, low lung volumes with vascular congestion, bibasilar atelectasis. Patient has multiple pressure ulcers.  He has been intermittently confused and discussion with the patient and his family have centered around poor quality of life.  His sister and mother discussed with primary hospitalist and nephrology, and plan moving forward is to discontinue dialysis and transition to hospice care.  I met with [name] this evening.  He remained somewhat confused during the encounter. He did endorse having pain in his low back and scrotum.  He was able to tell me that there is no plan for continuing dialysis and that he is going to die. He cannot really give me any other details and when asked agreed that I should call his sister to discuss further.  I called and spoke with his sister, [name] and his mother, [name].  They also relate that he has had very poor quality of life and they have been talking with his other physicians and believe the best way to serve him moving forward would be to  focus on his comfort.  They agree that he remains confused, and believe that if he were to understand his current situation his wishes would be to stop dialysis and pursue a care plan centered on comfort. We talked about options to get out of the hospital as this is something that is important to the patient. They are in agreement that he would best be served by placement at residential hospice facility such as [facility].    Contacts/Participants in Discussion: Patient, his sister, and his mother Primary Decision Maker: Sherri Price and Administrator, sports Relationship to Patient sister and mother HCPOA: None on chart  SUMMARY OF RECOMMENDATIONS - Patient has had very poor quality of life and continues to decline. Based upon this, his family believes his wishes would be to pursue comfort care through hospice. He does remain somewhat confused during my encounter but is able to verbalize the plan is to stop dialysis and that he would die at some point in the future because of this. - His sister said he may be upset to find out he is "on hospice," but she and her mother agree that this is care plan that would best serve [name] based upon his goals. I told him that I agree he is reaching the end of his life and wanted to focus on getting him some place to ensure that his symptoms are well managed and he feels as well as he can while being able to visit with family. He stated, "Good."   - Plan for referral to [place] Status/Advance Care Planning: DNR    Code Status Orders  Start     Ordered   03/09/15 1622  Do not attempt resuscitation (DNR)   Continuous    Question Answer Comment  In the event of cardiac or respiratory ARREST Do not call a "code blue"   In the event of cardiac or respiratory ARREST Do not perform Intubation, CPR, defibrillation or ACLS   In the event of cardiac or respiratory ARREST Use medication by any route, position, wound care, and other measures to relive  pain and suffering. May use oxygen, suction and manual treatment of airway obstruction as needed for comfort.      03/09/15 1621      Other Directives:None  Symptom Management:   Pain: As appears to be main symptom for Mr. Torbeck. He has been receiving intermittent IV hydromorphone which he tells me relieves his pain (but still endorses 10/10 anytime he is asked per bedside nurse). He received a dose of 1 mg shortly before my encounter. During my encounter he appears sleepy and confused but still reports he is having a lot of pain. Will plan for another rescue dose of 0.72m at this time. We may need to continue to go up on his dosing, but he is becoming increasingly sleepy and with his renal failure will continue to titrate up slowly as long as he appears to be resting comfortably.  I did increase the frequency is rescue dosing to every 2 hours as needed. His bedside care team reports his Dilaudid usually causes him to sleep for several hours whenever he receives a dose.  Confusion: Likely related to underlying pathology and kidney disease. As it appears he is resting comfortably following pain medication, will hold on further changes to his medication regimen with the hopes that he may be more clear tomorrow. I would have very low threshold to add on additional medications on as-needed basis for anxiety (ativan) or agitation (haldol) if the symptoms become an issue overnight.  Palliative Prophylaxis:   Bowel Regimen, Delirium Protocol, Frequent Pain Assessment, Palliative Wound Care and Turn Reposition  Psycho-social/Spiritual:  Support System: Strong. His sister and mother Desire for further Chaplaincy support: No  Prognosis: Hours - Days.  He has multiple comorbid conditions including dialysis dependent ESRD that has now been discontinued.  Based upon this, his expected prognosis would be less than 2 weeks.    Discharge Planning: Hospice facility   Chief Complaint/ Primary  Diagnoses: Present on Admission:  . Atrial fibrillation with rapid ventricular response ([place] . CAD (coronary artery disease) . Stage 3 skin ulcer of sacral region ([facility] [place] . CAD in native artery . A-fib ([place] . Tobacco abuse . Thrombocytopenia-chronic . Gastroesophageal reflux disease with esophagitis . Protein-calorie malnutrition ([facility] . Acute encephalopathy  I have reviewed the medical record, interviewed the patient and family, and examined the patient. The following aspects are pertinent.  Past Medical History  Diagnosis Date  . Coronary artery disease 07/05/2011    2D ECHO - EF ~40%, moderate concentric LV hypertrophy, LA moderately dilated, mild-moderate septal and inferior wall hypokinesis  . Hypertension   . CHF (congestive heart failure) ([facility]   . Diabetes mellitus     Insulin dependent  . Cardiomyopathy [place]   . Claudication ([place] 05/09/2011    Right and left anterior tibial arteries and Left SFA-occluded; Right CIA-<50% diameter reduction; Right Deep Profunda-70-99% diameter reduction; Right SFA->60% diameter reduction; Right Distal Popliteal/Tibial Artery- >60% diameter reduction; Left CFA and Profunda- >50% diameter reduction  . S/P CABG (coronary artery bypass graft) 03/23/2011`  STRESS TEST - LV EF 29%, mild reversible within the apical segment of the anterior and anterolateral wall, small infarct in the inferolateral wall, global hypokinesia  . GERD (gastroesophageal reflux disease)   . Peripheral vascular disease ([place])   . S/P AKA (above knee amputation) ([place]) 11/26/2014    REVISION OF AMPUTATION  . Chronic kidney disease (CKD), stage IV (severe) (HCC)     Dialysis - t/th/Sa   Social History   Social History  . Marital Status: Single    Spouse Name: N/A  . Number of Children: N/A  . Years of Education: N/A   Social History Main Topics  . Smoking status: Former Smoker -- 0.50 packs/day for 25 years    Types: Cigarettes    Quit date: 09/03/2013  .  Smokeless tobacco: Never Used  . Alcohol Use: Yes     Comment: occas.  . Drug Use: Yes    Special: Marijuana  . Sexual Activity: No   Other Topics Concern  . None   Social History Narrative   Family History  Problem Relation Age of Onset  . Heart disease Mother   . Hypertension Mother   . Diabetes Mother   . Arthritis Sister   . Diabetes Sister    Scheduled Meds: . amiodarone  200 mg Oral BID  . calcium acetate  1,334 mg Oral TID WC  . docusate sodium  200 mg Oral BID  . feeding supplement (NEPRO CARB STEADY)  237 mL Oral TID BM  . feeding supplement (PRO-STAT SUGAR FREE 64)  30 mL Oral TID WC  . folic acid  1 mg Oral Daily  . gabapentin  100 mg Oral TID  . insulin aspart  0-15 Units Subcutaneous TID WC  . insulin aspart  0-5 Units Subcutaneous QHS  . Melatonin  3 mg Oral QHS  . [START ON 03/10/2015] midodrine  10 mg Oral Q T,Th,Sa-HD  . [START ON 03/10/2015] multivitamin-lutein  1 capsule Oral QHS  . oxyCODONE  20 mg Oral Q12H  . pantoprazole  40 mg Oral Daily  . sodium chloride  3 mL Intravenous Q12H  . Warfarin - Pharmacist Dosing Inpatient   Does not apply q1800   Continuous Infusions:  PRN Meds:.sodium chloride, sodium chloride, sodium chloride, acetaminophen **OR** acetaminophen, alteplase, heparin, HYDROmorphone (DILAUDID) injection, lidocaine (PF), lidocaine-prilocaine, ondansetron **OR** ondansetron (ZOFRAN) IV, oxyCODONE, pentafluoroprop-tetrafluoroeth, polyethylene glycol, sodium chloride Medications Prior to Admission:  Prior to Admission medications   Medication Sig Start Date End Date Taking? Authorizing Provider  Amino Acids-Protein Hydrolys (FEEDING SUPPLEMENT, PRO-STAT SUGAR FREE 64,) LIQD Take 30 mLs by mouth 3 (three) times daily with meals. Ok to mixed it out with juice or any other drinks to facilitate intake 01/03/15  Yes Barton Dubois, MD  amiodarone (PACERONE) 400 MG tablet Take 1 tablet (400 mg total) by mouth daily. 01/03/15  Yes Barton Dubois, MD   calcium acetate (PHOSLO) 667 MG capsule Take 2 capsules (1,334 mg total) by mouth 3 (three) times daily with meals. 10/17/14  Yes Florencia Reasons, MD  Darbepoetin Alfa (ARANESP) 100 MCG/0.5ML SOSY injection Inject 0.5 mLs (100 mcg total) into the vein every Tuesday with hemodialysis. 10/17/14  Yes Florencia Reasons, MD  docusate sodium (COLACE) 100 MG capsule Take 1 capsule (100 mg total) by mouth 2 (two) times daily. Hold for diarrhea 01/03/15  Yes Barton Dubois, MD  folic acid (FOLVITE) 1 MG tablet Take 1 tablet (1 mg total) by mouth daily. 10/17/14  Yes Florencia Reasons, MD  gabapentin (  NEURONTIN) 100 MG capsule Take 1 capsule (100 mg total) by mouth 3 (three) times daily. 01/03/15  Yes Barton Dubois, MD  insulin aspart (NOVOLOG) 100 UNIT/ML injection Inject 0-9 Units into the skin 3 (three) times daily with meals. Before each meal 3 times a day, 140-199 - 2 units, 200-250 - 4 units, 251-299 - 6 units,  300-349 - 8 units,  350 or above 10 units. Insulin PEN if approved, provide syringes and needles if needed. Patient taking differently: Inject 0-10 Units into the skin 4 (four) times daily -  with meals and at bedtime. 140-199 - 2 units, 200-250 - 4 units, 251-299 - 6 units,  300-349 - 8 units,  350 or above 10 units. Insulin PEN if approved, provide syringes and needles if needed 10/18/14  Yes Florencia Reasons, MD  Melatonin 3 MG TABS Take 3 mg by mouth at bedtime.   Yes Historical Provider, MD  midodrine (PROAMATINE) 10 MG tablet Take 10 mg by mouth See admin instructions. Only on Tuesday, Thursday, Saturday with Hemodialysyis   Yes Historical Provider, MD  Multiple Vitamins-Minerals (DECUBI-VITE PO) Take 1 tablet by mouth daily.   Yes Historical Provider, MD  Nutritional Supplements (FEEDING SUPPLEMENT, NEPRO CARB STEADY,) LIQD Take 237 mLs by mouth 3 (three) times daily between meals. 01/03/15  Yes Barton Dubois, MD  oxyCODONE (OXYCONTIN) 20 mg 12 hr tablet Take 20 mg by mouth every 12 (twelve) hours.   Yes Historical Provider, MD   OXYCODONE HCL PO Take 5 mg by mouth every 4 (four) hours as needed (pain).   Yes Historical Provider, MD  pantoprazole (PROTONIX) 40 MG tablet Take 40 mg by mouth daily.   Yes Historical Provider, MD  polyethylene glycol (MIRALAX / GLYCOLAX) packet Take 17 g by mouth daily as needed for moderate constipation. 10/17/14  Yes Florencia Reasons, MD  warfarin (COUMADIN) 2 MG tablet Take 2 mg by mouth daily.   Yes Historical Provider, MD  nitroGLYCERIN (NITROSTAT) 0.4 MG SL tablet Place 1 tablet (0.4 mg total) under the tongue once. Patient taking differently: Place 0.4 mg under the tongue every 5 (five) minutes as needed for chest pain.  10/01/13   Mihai Croitoru, MD  OxyCODONE (OXYCONTIN) 15 mg T12A 12 hr tablet Take 1 tablet (15 mg total) by mouth every 8 (eight) hours. 01/03/15   Barton Dubois, MD   No Known Allergies  Review of Systems  Unable to perform ROS: Mental status change    Physical Exam General: Chronically ill appearing male, appears older than stated age, in mild distress due to pain.  Head: Normocephalic, atraumatic, sclera non-icteric, mucus membranes are dry. Neck: Supple. No JVD Lungs:  Fair air mvmt, decreased in bases with crackles LLL.  Heart: RRR with S1 S2. No murmurs, rubs, or gallops appreciated. SR on monitor.  Abdomen: Distended. Hypoactive BS. Nontender. Skin: Necrotic areas on penis.,Foam drsg to R stump.  Lower extremities: bilateral AKA. Has Foam drsg R stump for pressure ulcers. No edema UE, Sacral edema. Neuro: Sleepy and somewhat confused but follows commands and no focal deficits appreciated  Vital Signs: BP 148/96 mmHg  Pulse 80  Temp(Src) 97.9 F (36.6 C) (Oral)  Resp 23  Ht 3' 5"  (1.041 m)  Wt 63.368 kg (139 lb 11.2 oz)  BMI 58.47 kg/m2  SpO2 99%  SpO2: SpO2: 99 % O2 Device:SpO2: 99 % O2 Flow Rate: .O2 Flow Rate (L/min): 2 L/min  IO: Intake/output summary:  Intake/Output Summary (Last 24 hours) at 03/09/15 2247 Last data  filed at 03/09/15 1141   Gross per 24 hour  Intake 859.97 ml  Output      0 ml  Net 859.97 ml    LBM: Last BM Date:  (unknown) Baseline Weight: Weight: 69.4 kg (153 lb) Most recent weight: Weight: 63.368 kg (139 lb 11.2 oz)      Palliative Assessment/Data:  Flowsheet Rows        Most Recent Value   Intake Tab    Referral Department  Hospitalist   Unit at Time of Referral  Cardiac/Telemetry Unit   Palliative Care Primary Diagnosis  Cardiac   Date Notified  03/09/15   Palliative Care Type  New Palliative care   Reason for referral  Clarify Goals of Care   Date of Admission  03/08/15   # of days IP prior to Palliative referral  1   Clinical Assessment    Psychosocial & Spiritual Assessment    Palliative Care Outcomes       Additional Data Reviewed:  CBC:    Component Value Date/Time   WBC 16.8* 03/09/2015 1530   HGB 11.2* 03/09/2015 1530   HCT 35.9* 03/09/2015 1530   HCT 30.5* 03/08/2015 1840   PLT 138* 03/09/2015 1530   MCV 90.2 03/09/2015 1530   NEUTROABS 12.1* 03/08/2015 1133   LYMPHSABS 0.4* 03/08/2015 1133   MONOABS 0.8 03/08/2015 1133   EOSABS 0.0 03/08/2015 1133   BASOSABS 0.0 03/08/2015 1133   Comprehensive Metabolic Panel:    Component Value Date/Time   NA 137 03/09/2015 1530   K 4.4 03/09/2015 1530   CL 94* 03/09/2015 1530   CO2 21* 03/09/2015 1530   BUN 60* 03/09/2015 1530   CREATININE 4.90* 03/09/2015 1530   GLUCOSE 136* 03/09/2015 1530   CALCIUM 7.7* 03/09/2015 1530   AST 30 03/08/2015 1133   ALT 19 03/08/2015 1133   ALKPHOS 116 03/08/2015 1133   BILITOT 1.3* 03/08/2015 1133   PROT 6.6 03/08/2015 1133   ALBUMIN 2.3* 03/09/2015 1530     Time In: 1710 Time Out: 1810 Time Total: 60 Greater than 50%  of this time was spent counseling and coordinating care related to the above assessment and plan.  Signed by: Micheline Rough, MD  Micheline Rough, MD  03/09/2015, 10:47 PM  Please contact Palliative Medicine Team phone at [phone] for questions and concerns.

## 2015-03-09 NOTE — Consult Note (Signed)
[facility] Renal Consultation Note    Indication for Consultation:  Management of ESRD/hemodialysis; anemia, hypertension/volume and secondary hyperparathyroidism PCP:  [name], NP   HPI: [name] is a 49 y.o. male with ESRD on hemodialysis TTS at [facility]. 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 Patient has history of polysubstance abuse, tobacco abuse.   Patient was brought to ED from SNF for altered mental status. Patient was found in be in rapid Afib with RVR. Patient was started on amiodarone, cardiology was consulted. Patient also had mild leukocytosis but was afebrile. CXR showed marked cardiomegaly, low lung volumes with vascular congestion, bibasilar atelectasis. Patient has multiple pressure ulcers.   Currently, patient is awake, oriented to person and place actively complaining of pain in sacral area and abdomen. States he doesn't recall very much about 24 hours and is unable to assist with assessment. "I just was hurting and I felt bad and that's all I remember. I woke up and I was here". Denies SOB at present although slightly agitated. Per admitting H & P, no reports of fever, chills, N,V, D, syncope.  Patient has hemodialysis TTW at [facility]. He missed treatment last Tuesday 11/08, came in 11/10 7.3 kg over EDW. Was at EDW of 64.5 his last treatment 03/07/15. Last incenter HGB 10.0 11/03 and is on Mircera 150 mcg IV q 2 weeks,  Ca 8.3 C Ca 9.1 Phos 11.0 PTH 413 (02/19/2015). He is prescribed Ca Acetate binders, has midodrine 10 mg PO PRN on HD days.    Past Medical History  Diagnosis Date  . Coronary artery disease 07/05/2011    2D ECHO - EF ~40%, moderate concentric LV hypertrophy, LA moderately dilated, mild-moderate  septal and inferior wall hypokinesis  . Hypertension   . CHF (congestive heart failure) (HCC)   . Diabetes mellitus     Insulin dependent  . Cardiomyopathy (HCC)   . Claudication (HCC) 05/09/2011    Right and left anterior tibial arteries and Left SFA-occluded; Right CIA-<50% diameter reduction; Right Deep Profunda-70-99% diameter reduction; Right SFA->60% diameter reduction; Right Distal Popliteal/Tibial Artery- >60% diameter reduction; Left CFA and Profunda- >50% diameter reduction  . 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 GERD (gastroesophageal reflux disease)   . Peripheral vascular disease (HCC)   . 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 Chronic kidney disease (CKD), stage IV (severe) (HCC)     Dialysis - t/th/Sa   Past Surgical History  Procedure Laterality Date  . Cardiac catheterization  05/07/2008    CABG  . Coronary artery bypass graft  06/2008    x3  . Insertion of dialysis catheter Right 10/01/2014    Procedure: INSERTION OF RIGHT INTERNAL JUGULAR DIALYSIS CATHETER;  Surgeon: Larina Earthly, MD;  Location: [facility] OR;  Service: Vascular;  Laterality: Right;  . Amputation Right 10/06/2014    Procedure: AMPUTATION ABOVE KNEE Right;  Surgeon: Sherren Kerns, MD;  Location: [facility] OR;  Service: Vascular;  Laterality: Right;  . Av fistula placement Left 10/10/2014    Procedure: LEFT BRACHIAL TO CEPHALIC ARTERIOVENOUS (AV) FISTULA CREATION;  Surgeon: Larina Earthly, MD;  Location: [facility] OR;  Service: Vascular;  Laterality: Left;  . Amputation Right 11/26/2014    Procedure: REVISION AMPUTATION ABOVE KNEE;  Surgeon: Sherren Kerns, MD;  Location: [facility] OR;  Service: Vascular;  Laterality: Right;  . Peripheral vascular catheterization N/A 12/12/2014    Procedure: Abdominal Aortogram;   Surgeon: Sherren Kerns, MD;  Location: [facility] INVASIVE CV LAB;  Service: Cardiovascular;  Laterality: N/A;  . Amputation Left 12/31/2014    Procedure: AMPUTATION ABOVE KNEE;  Surgeon: Larina Earthly, MD;  Location: [facility] OR;  Service: Vascular;  Laterality: Left;   Family History  Problem Relation Age of Onset  . Heart disease Mother   . Hypertension Mother   . Diabetes Mother   . Arthritis Sister   . Diabetes Sister    Social History:  reports that he quit smoking about 18 months ago. His smoking use included Cigarettes. He has a 12.5 pack-year smoking history. He has never used smokeless tobacco. He reports that he drinks alcohol. He reports that he uses illicit drugs (Marijuana). No Known Allergies Prior to Admission medications   Medication Sig Start Date End Date Taking? Authorizing Provider  Amino Acids-Protein Hydrolys (FEEDING SUPPLEMENT, PRO-STAT SUGAR FREE 64,) LIQD Take 30 mLs by mouth 3 (three) times daily with meals. Ok to mixed it out with juice or any other drinks to facilitate intake 01/03/15  Yes Vassie Loll, MD  amiodarone (PACERONE) 400 MG tablet Take 1 tablet (400 mg total) by mouth daily. 01/03/15  Yes Vassie Loll, MD  calcium acetate (PHOSLO) 667 MG capsule Take 2 capsules (1,334 mg total) by mouth 3 (three) times daily with meals. 10/17/14  Yes Albertine Grates, MD  Darbepoetin Alfa (ARANESP) 100 MCG/0.5ML SOSY injection Inject 0.5 mLs (100 mcg total) into the vein every Tuesday with hemodialysis. 10/17/14  Yes Albertine Grates, MD  docusate sodium (COLACE) 100 MG capsule Take 1 capsule (100 mg total) by mouth 2 (two) times daily. Hold for diarrhea 01/03/15  Yes Vassie Loll, MD  folic acid (FOLVITE) 1 MG tablet Take 1 tablet (1 mg total) by mouth daily. 10/17/14  Yes Albertine Grates, MD  gabapentin (NEURONTIN) 100 MG capsule Take 1 capsule (100 mg total) by mouth 3 (three) times daily. 01/03/15  Yes Vassie Loll, MD  insulin aspart (NOVOLOG) 100 UNIT/ML injection Inject 0-9 Units into the skin 3 (three)  times daily with meals. Before each meal 3 times a day, 140-199 - 2 units, 200-250 - 4 units, 251-299 - 6 units,  300-349 - 8 units,  350 or above 10 units. Insulin PEN if approved, provide syringes and needles if needed. Patient taking differently: Inject 0-10 Units into the skin 4 (four) times daily -  with meals and at bedtime. 140-199 - 2 units, 200-250 - 4 units, 251-299 - 6 units,  300-349 - 8 units,  350 or above 10 units. Insulin PEN if approved, provide syringes and needles if needed 10/18/14  Yes Albertine Grates, MD  Melatonin 3 MG TABS Take 3 mg by mouth at bedtime.   Yes Historical Provider, MD  midodrine (PROAMATINE) 10 MG tablet Take 10 mg by mouth See admin instructions. Only on Tuesday, Thursday, Saturday with Hemodialysyis   Yes Historical Provider, MD  Multiple Vitamins-Minerals (DECUBI-VITE PO) Take 1 tablet by mouth daily.   Yes Historical Provider, MD  Nutritional Supplements (FEEDING SUPPLEMENT, NEPRO CARB STEADY,) LIQD Take 237 mLs by mouth 3 (three) times daily between meals. 01/03/15  Yes Mikle Bosworth  Gwenlyn PerkingMadera, MD  oxyCODONE (OXYCONTIN) 20 mg 12 hr tablet Take 20 mg by mouth every 12 (twelve) hours.   Yes Historical Provider, MD  OXYCODONE HCL PO Take 5 mg by mouth every 4 (four) hours as needed (pain).   Yes Historical Provider, MD  pantoprazole (PROTONIX) 40 MG tablet Take 40 mg by mouth daily.   Yes Historical Provider, MD  polyethylene glycol (MIRALAX / GLYCOLAX) packet Take 17 g by mouth daily as needed for moderate constipation. 10/17/14  Yes Albertine GratesFang Xu, MD  warfarin (COUMADIN) 2 MG tablet Take 2 mg by mouth daily.   Yes Historical Provider, MD  nitroGLYCERIN (NITROSTAT) 0.4 MG SL tablet Place 1 tablet (0.4 mg total) under the tongue once. Patient taking differently: Place 0.4 mg under the tongue every 5 (five) minutes as needed for chest pain.  10/01/13   Mihai Croitoru, MD  OxyCODONE (OXYCONTIN) 15 mg T12A 12 hr tablet Take 1 tablet (15 mg total) by mouth every 8 (eight) hours. 01/03/15    Vassie Lollarlos Madera, MD   Current Facility-Administered Medications  Medication Dose Route Frequency Provider Last Rate Last Dose  . 0.9 %  sodium chloride infusion  250 mL Intravenous PRN Gwenyth BenderKaren M Black, NP      . acetaminophen (TYLENOL) tablet 650 mg  650 mg Oral Q6H PRN Gwenyth BenderKaren M Black, NP       Or  . acetaminophen (TYLENOL) suppository 650 mg  650 mg Rectal Q6H PRN Gwenyth BenderKaren M Black, NP      . amiodarone (PACERONE) tablet 200 mg  200 mg Oral BID Leroy SeaPrashant K Singh, MD      . calcium acetate (PHOSLO) capsule 1,334 mg  1,334 mg Oral TID WC Gwenyth BenderKaren M Black, NP   1,334 mg at 03/09/15 [id number]  . docusate sodium (COLACE) capsule 200 mg  200 mg Oral BID Leroy SeaPrashant K Singh, MD      . feeding supplement (NEPRO CARB STEADY) liquid 237 mL  237 mL Oral TID BM Gwenyth BenderKaren M Black, NP   237 mL at 03/09/15 1004  . feeding supplement (PRO-STAT SUGAR FREE 64) liquid 30 mL  30 mL Oral TID WC Gwenyth BenderKaren M Black, NP   30 mL at 03/09/15 [id number]  . folic acid (FOLVITE) tablet 1 mg  1 mg Oral Daily Gwenyth BenderKaren M Black, NP   1 mg at 03/09/15 0941  . gabapentin (NEURONTIN) capsule 100 mg  100 mg Oral TID Gwenyth BenderKaren M Black, NP   100 mg at 03/09/15 0941  . HYDROmorphone (DILAUDID) injection 1 mg  1 mg Intravenous Q4H PRN Leroy SeaPrashant K Singh, MD   1 mg at 03/09/15 0834  . insulin aspart (novoLOG) injection 0-15 Units  0-15 Units Subcutaneous TID WC Gwenyth BenderKaren M Black, NP   0 Units at 03/08/15 1700  . insulin aspart (novoLOG) injection 0-5 Units  0-5 Units Subcutaneous QHS Gwenyth BenderKaren M Black, NP   0 Units at 03/08/15 2200  . Melatonin TABS 3 mg  3 mg Oral QHS Gwenyth BenderKaren M Black, NP   3 mg at 03/08/15 2116  . [START ON 03/10/2015] midodrine (PROAMATINE) tablet 10 mg  10 mg Oral Q T,Th,Sa-HD Lesle ChrisKaren M Black, NP      . multivitamin-lutein (OCUVITE-LUTEIN) capsule 1 capsule  1 capsule Oral Daily Calvert CantorSaima Rizwan, MD   1 capsule at 03/09/15 0941  . ondansetron (ZOFRAN) tablet 4 mg  4 mg Oral Q6H PRN Gwenyth BenderKaren M Black, NP       Or  . ondansetron [facility](ZOFRAN) injection 4 mg  4  mg Intravenous Q6H PRN  Gwenyth Bender, NP      . oxyCODONE (OXYCONTIN) 12 hr tablet 20 mg  20 mg Oral Q12H Gwenyth Bender, NP   20 mg at 03/09/15 0941  . oxyCODONE (ROXICODONE) 5 MG/5ML solution 5 mg  5 mg Oral Q4H PRN Gwenyth Bender, NP   5 mg at 03/09/15 0746  . pantoprazole (PROTONIX) EC tablet 40 mg  40 mg Oral Daily Gwenyth Bender, NP   40 mg at 03/09/15 0941  . polyethylene glycol (MIRALAX / GLYCOLAX) packet 17 g  17 g Oral Daily PRN Lesle Chris Black, NP      . sodium chloride 0.9 % injection 3 mL  3 mL Intravenous Q12H Gwenyth Bender, NP   3 mL at 03/09/15 0948  . sodium chloride 0.9 % injection 3 mL  3 mL Intravenous PRN Gwenyth Bender, NP      . Warfarin - Pharmacist Dosing Inpatient   Does not apply q1800 Calvert Cantor, MD   0  at 03/08/15 1716   Labs: Basic Metabolic Panel:  Recent Labs Lab 03/08/15 1133 03/09/15 0220  NA 137 136  K 4.5 4.3  CL 94* 95*  CO2 21* 19*  GLUCOSE 93 118*  BUN 44* 53*  CREATININE 4.28* 4.58*  CALCIUM 8.4* 7.9*   Liver Function Tests:  Recent Labs Lab 03/08/15 1133  AST 30  ALT 19  ALKPHOS 116  BILITOT 1.3*  PROT 6.6  ALBUMIN 2.4*   No results for input(s): LIPASE, AMYLASE in the last 168 hours.  Recent Labs Lab 03/08/15 1840  AMMONIA 30   CBC:  Recent Labs Lab 03/08/15 1133 03/09/15 0220  WBC 13.3* 14.2*  NEUTROABS 12.1*  --   HGB 12.4* 10.6*  HCT 39.2 33.9*  MCV 91.0 90.9  PLT 125* 135*   Cardiac Enzymes: No results for input(s): CKTOTAL, CKMB, CKMBINDEX, TROPONINI in the last 168 hours. CBG:  Recent Labs Lab 03/08/15 1732 03/08/15 1804 03/08/15 2102 03/09/15 0757  GLUCAP 131* 98 107* 110*   Iron Studies: No results for input(s): IRON, TIBC, TRANSFERRIN, FERRITIN in the last 72 hours. Studies/Results: Dg Chest 1 View  03/08/2015  CLINICAL DATA:  Altered mental status, end-stage renal disease, hypoxia EXAM: CHEST 1 VIEW COMPARISON:  02/19/2015 FINDINGS: Right IJ dialysis catheter tips SVC RA junction as before. Prior coronary bypass noted.  Marked cardiomegaly and low lung volumes with vascular congestion. Basilar atelectasis as before. No significant interval change. No large effusion or pneumothorax. Trachea midline. No acute osseous finding. IMPRESSION: Very low lung volumes with marked cardiomegaly and vascular congestion Basilar atelectasis No significant interval change. Electronically Signed   By: Judie Petit.  Shick M.D.   On: 03/08/2015 12:14   Dg Abd Portable 1v  03/09/2015  CLINICAL DATA:  Diffuse abdominal pain with distention and nausea. EXAM: PORTABLE ABDOMEN - 1 VIEW COMPARISON:  None. FINDINGS: Single minimally motion degraded supine view of the abdomen and pelvis. Mild to moderate gastric distension. Gas-filled small bowel loops, maximally 3.3 cm. Gas and stool within the ascending colon. Pelvis excluded. No free intraperitoneal air. IMPRESSION: Gaseous and less so small bowel distension, mild. Stool within the ascending colon. Findings may relate to a mild adynamic ileus. Low-grade partial small bowel obstruction cannot be excluded. Limitations, including motion and exclusion of the pelvis. Electronically Signed   By: Jeronimo Greaves M.D.   On: 03/09/2015 10:09    ROS: As per HPI otherwise negative.   Physical Exam: Ceasar Mons  Vitals:   03/08/15 2200 03/09/15 0155 03/09/15 0355 03/09/15 0758  BP: 122/75 130/61 132/84   Pulse: 111 84 82   Temp:   98.6 F (37 C) 97.6 F (36.4 C)  TempSrc:   Oral Oral  Resp: Height:      Weight:      SpO2: 96% 95%       General: Chronically ill appearing male, looks much older than stated age, in mild distress due to pain.  Head: Normocephalic, atraumatic, sclera non-icteric, mucus membranes are dry. Neck: Supple. JVD not elevated. Lungs: Bilateral breath sounds, decreased in bases with crackles LLL.  Heart: RRR with S1 S2. No murmurs, rubs, or gallops appreciated. SR on monitor.  Abdomen: Distended, taunt. Hypoactive BS. Nontender. Skin: Foam drsgs to sacral area, has two necrotic  areas on penis.,Foam drsg to R stump. Poor turgor. Lower extremities: bilateral AKA. Has Foam drsg R stump for pressure ulcers. No edema UE, however does have sacral edema, edema in hips.  Neuro: Alert and oriented X 2. Follows commands. Psych:  Anxious due to being in pain. Dialysis Access: Maturing LUA AVF, R Tunneled perm cath Drsg C/D/I.   Dialysis Orders: Center: [facility]  on TTS . EDW 64.5 HD Bath 2.0 K 2.25 Ca  Time 4 hours  Heparin No Heparin . Access R tunneled perm cath BFR 300 DFR 800    Mircera 150 mcg IV q 2 weeks ( recently restarted-no dose given yet) Hectoral 2 mcg IV TTS (Last dose 03/07/2015)  Assessment/Plan: 1.  AFIB with RVR-is transitioning from amiodarone drip to PO. Cardiology following. 2.  Multiple Pressure Ulcers/Necrotic areas on penis:  Primary/Wound care following  3. Acute encephalopathy: Per primary, now resolving.  4.  ESRD -  TTS at [facility]. Will had HD today off schedule. K+ 4.3 2 5.  Hypertension/volume  - Not on antihypertensive medications per OP med list. Has order for midodrine prior to HD. Wt is conflicting-69.4 kg on adm. Later wt recorded 63.3 Has vascular congestion per CXR. Will attempt UFG 2-2.5 liters as tolerated. 6.  Anemia  - HGB 10.6. Follow CBC. No esa now.  7.  Metabolic bone disease -  Continue Ca Acetate binders as per home.  8.  Nutrition - NPO at present. Renal diet, protein supplements when able to have PO intake 9. Leukocytosis: Per primary. WBCs 14.2. No fevers 10. DM: Per primary   Rita H. Manson Passey, NP-C 03/09/2015, 11:04 AM  [employer] [phone]  Pt seen, examined, agree w assess/plan as above with additions as indicated. Spoke with pt's sister and mother -  patient's QOL is very poor and unlikely to improve from here.  They agree to transition to hospice Rx at this time, no further dialysis. Have d/w primary MD.   Vinson Moselle MD [facility] pager [phone]    cell [phone] 03/09/2015,  12:03 PM

## 2015-03-09 NOTE — Progress Notes (Signed)
UR Completed Mahreen Schewe Graves-Bigelow, RN,BSN [phone]  

## 2015-03-09 NOTE — Consult Note (Signed)
Urology Consult  Referring physician: Lala Lund, MD Reason for referral: Penile lesion  Chief Complaint: Penile lesion  History of Present Illness:  [name] is a 49 y.o. male with past medical history that includes A. Fib on Coumadin, PVD status post bilateral above-the-knee amputations, CAD, substance abuse, hypertension, diabetes, ESRD on HD admitted yesterday after presenting to ER with altered mental status.   He was admitted for A.Fib with RVR, leukocytosisis. Urology was consulted due to a penile lesion. He reports this has been present for "a while" but is unsure when he first noticed it. The lesion is painful. He is unsure if it has progressed over time.     Past Medical History  Diagnosis Date  . Coronary artery disease 07/05/2011    2D ECHO - EF ~40%, moderate concentric LV hypertrophy, LA moderately dilated, mild-moderate septal and inferior wall hypokinesis  . Hypertension   . CHF (congestive heart failure) ([place])   . Diabetes mellitus     Insulin dependent  . Cardiomyopathy ([place])   . Claudication ([place]) 05/09/2011    Right and left anterior tibial arteries and Left SFA-occluded; Right CIA-<50% diameter reduction; Right Deep Profunda-70-99% diameter reduction; Right SFA->60% diameter reduction; Right Distal Popliteal/Tibial Artery- >60% diameter reduction; Left CFA and Profunda- >50% diameter reduction  . 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 GERD (gastroesophageal reflux disease)   . Peripheral vascular disease ([place])   . S/P AKA (above knee amputation) ([place]) 11/26/2014    REVISION OF AMPUTATION  . Chronic kidney disease (CKD), stage IV (severe) (HCC)     Dialysis - t/th/Sa   Past Surgical History  Procedure Laterality Date  . Cardiac catheterization  05/07/2008    CABG  .  Coronary artery bypass graft  06/2008    x3  . Insertion of dialysis catheter Right 10/01/2014    Procedure: INSERTION OF RIGHT INTERNAL JUGULAR DIALYSIS CATHETER;  Surgeon: Rosetta Posner, MD;  Location: [facility];  Service: Vascular;  Laterality: Right;  . Amputation Right 10/06/2014    Procedure: AMPUTATION ABOVE KNEE Right;  Surgeon: Elam Dutch, MD;  Location: [facility];  Service: Vascular;  Laterality: Right;  . Av fistula placement Left 10/10/2014    Procedure: LEFT BRACHIAL TO CEPHALIC ARTERIOVENOUS (AV) FISTULA CREATION;  Surgeon: Rosetta Posner, MD;  Location: [facility];  Service: Vascular;  Laterality: Left;  . Amputation Right 11/26/2014    Procedure: REVISION AMPUTATION ABOVE KNEE;  Surgeon: Elam Dutch, MD;  Location: [facility] OR;  Service: Vascular;  Laterality: Right;  . Peripheral vascular catheterization N/A 12/12/2014    Procedure: Abdominal Aortogram;  Surgeon: Elam Dutch, MD;  Location: [facility];  Service: Cardiovascular;  Laterality: N/A;  . Amputation Left 12/31/2014    Procedure: AMPUTATION ABOVE KNEE;  Surgeon: Rosetta Posner, MD;  Location: [facility] OR;  Service: Vascular;  Laterality: Left;    Medications: I have reviewed the patient's current medications. Allergies: No Known Allergies  Family History  Problem Relation Age of Onset  . Heart disease Mother   . Hypertension Mother   . Diabetes Mother   . Arthritis Sister   . Diabetes Sister    Social History:  reports that he quit smoking about 18 months ago. His smoking use included Cigarettes. He has a 12.5 pack-year smoking history.  He has never used smokeless tobacco. He reports that he drinks alcohol. He reports that he uses illicit drugs (Marijuana).  ROS  12 system review was performed and was negative except the pertinent positives listed in the HPI  Physical Exam:  Vital signs in last 24 hours: Temp:  [97.6 F (36.4 C)-98.6 F (37 C)] 97.6 F (36.4 C) (11/14 0758) Pulse Rate:  [82-114] 82 (11/14 0355) Resp:   [13-23] 22 (11/14 0355) BP: (116-152)/(61-101) 132/84 mmHg (11/14 0355) SpO2:  [92 %-98 %] 95 % (11/14 0155) Weight:  [63.368 kg (139 lb 11.2 oz)] 63.368 kg (139 lb 11.2 oz) (11/13 1759) Physical Exam  Genitourinary: Penis is circumcised with dry dark black superficial appearing flat areas on the left dorsal surface and bilateral lateral surfaces of the penis. No sloughing. No wet necrosis. Mildly tender to palpation. Scrotum is normal.  Extremities: S/p Bilateral BKAs  Laboratory Data:  Results for orders placed or performed during the hospital encounter of 03/08/15 (from the past 72 hour(s))  CBC with Differential     Status: Abnormal   Collection Time: 03/08/15 11:33 AM  Result Value Ref Range   WBC 13.3 (H) 4.0 - 10.5 K/uL   RBC 4.31 4.22 - 5.81 MIL/uL   Hemoglobin 12.4 (L) 13.0 - 17.0 g/dL   HCT 39.2 39.0 - 52.0 %   MCV 91.0 78.0 - 100.0 fL   MCH 28.8 26.0 - 34.0 pg   MCHC 31.6 30.0 - 36.0 g/dL   RDW 16.8 (H) 11.5 - 15.5 %   Platelets 125 (L) 150 - 400 K/uL   Neutrophils Relative % 91 %   Neutro Abs 12.1 (H) 1.7 - 7.7 K/uL   Lymphocytes Relative 3 %   Lymphs Abs 0.4 (L) 0.7 - 4.0 K/uL   Monocytes Relative 6 %   Monocytes Absolute 0.8 0.1 - 1.0 K/uL   Eosinophils Relative 0 %   Eosinophils Absolute 0.0 0.0 - 0.7 K/uL   Basophils Relative 0 %   Basophils Absolute 0.0 0.0 - 0.1 K/uL  Comprehensive metabolic panel     Status: Abnormal   Collection Time: 03/08/15 11:33 AM  Result Value Ref Range   Sodium 137 135 - 145 mmol/L   Potassium 4.5 3.5 - 5.1 mmol/L   Chloride 94 (L) 101 - 111 mmol/L   CO2 21 (L) 22 - 32 mmol/L   Glucose, Bld 93 65 - 99 mg/dL   BUN 44 (H) 6 - 20 mg/dL   Creatinine, Ser 4.28 (H) 0.61 - 1.24 mg/dL   Calcium 8.4 (L) 8.9 - 10.3 mg/dL   Total Protein 6.6 6.5 - 8.1 g/dL   Albumin 2.4 (L) 3.5 - 5.0 g/dL   AST 30 15 - 41 U/L   ALT 19 17 - 63 U/L   Alkaline Phosphatase 116 38 - 126 U/L   Total Bilirubin 1.3 (H) 0.3 - 1.2 mg/dL   GFR calc non Af Amer  15 (L) >60 mL/min   GFR calc Af Amer 17 (L) >60 mL/min    Comment: (NOTE) The eGFR has been calculated using the CKD EPI equation. This calculation has not been validated in all clinical situations. eGFR's persistently <60 mL/min signify possible Chronic Kidney Disease.    Anion gap 22 (H) 5 - 15    Comment: RESULT REPEATED AND VERIFIED  Brain natriuretic peptide     Status: Abnormal   Collection Time: 03/08/15  1:26 PM  Result Value Ref Range   B Natriuretic Peptide >4500.0 (H)  0.0 - 100.0 pg/mL  Protime-INR     Status: Abnormal   Collection Time: 03/08/15  1:26 PM  Result Value Ref Range   Prothrombin Time 31.6 (H) 11.6 - 15.2 seconds   INR 3.12 (H) 0.00 - 1.49  CBG monitoring, ED     Status: Abnormal   Collection Time: 03/08/15  5:32 PM  Result Value Ref Range   Glucose-Capillary 131 (H) 65 - 99 mg/dL  Glucose, capillary     Status: None   Collection Time: 03/08/15  6:04 PM  Result Value Ref Range   Glucose-Capillary 98 65 - 99 mg/dL  MRSA PCR Screening     Status: None   Collection Time: 03/08/15  6:37 PM  Result Value Ref Range   MRSA by PCR NEGATIVE NEGATIVE    Comment:        The GeneXpert MRSA Assay (FDA approved for NASAL specimens only), is one component of a comprehensive MRSA colonization surveillance program. It is not intended to diagnose MRSA infection nor to guide or monitor treatment for MRSA infections.   TSH     Status: Abnormal   Collection Time: 03/08/15  6:40 PM  Result Value Ref Range   TSH 10.704 (H) 0.350 - 4.500 uIU/mL  Ammonia     Status: None   Collection Time: 03/08/15  6:40 PM  Result Value Ref Range   Ammonia 30 9 - 35 umol/L  Vitamin B12     Status: Abnormal   Collection Time: 03/08/15  6:40 PM  Result Value Ref Range   Vitamin B-12 2184 (H) 180 - 914 pg/mL    Comment: (NOTE) This assay is not validated for testing neonatal or myeloproliferative syndrome specimens for Vitamin B12 levels.   Folate RBC     Status: Abnormal    Collection Time: 03/08/15  6:40 PM  Result Value Ref Range   Folate, Hemolysate >620.0 Not Estab. ng/mL   Hematocrit 30.5 (L) 37.5 - 51.0 %   Folate, RBC >2033 >498 ng/mL    Comment: (NOTE) Performed At: [facility] [place], [place] [id number] Lindon Romp MD [id number]   RPR     Status: None   Collection Time: 03/08/15  6:40 PM  Result Value Ref Range   RPR Ser Ql Non Reactive Non Reactive    Comment: (NOTE) Performed At: [facility] [place], [place] [id number] Lindon Romp MD [id number]   Glucose, capillary     Status: Abnormal   Collection Time: 03/08/15  9:02 PM  Result Value Ref Range   Glucose-Capillary 107 (H) 65 - 99 mg/dL  Basic metabolic panel     Status: Abnormal   Collection Time: 03/09/15  2:20 AM  Result Value Ref Range   Sodium 136 135 - 145 mmol/L   Potassium 4.3 3.5 - 5.1 mmol/L   Chloride 95 (L) 101 - 111 mmol/L   CO2 19 (L) 22 - 32 mmol/L   Glucose, Bld 118 (H) 65 - 99 mg/dL   BUN 53 (H) 6 - 20 mg/dL   Creatinine, Ser 4.58 (H) 0.61 - 1.24 mg/dL   Calcium 7.9 (L) 8.9 - 10.3 mg/dL   GFR calc non Af Amer 14 (L) >60 mL/min   GFR calc Af Amer 16 (L) >60 mL/min    Comment: (NOTE) The eGFR has been calculated using the CKD EPI equation. This calculation has not been validated in all clinical situations. eGFR's persistently <60 mL/min signify possible Chronic Kidney Disease.  Anion gap 22 (H) 5 - 15  CBC     Status: Abnormal   Collection Time: 03/09/15  2:20 AM  Result Value Ref Range   WBC 14.2 (H) 4.0 - 10.5 K/uL   RBC 3.73 (L) 4.22 - 5.81 MIL/uL   Hemoglobin 10.6 (L) 13.0 - 17.0 g/dL   HCT 33.9 (L) 39.0 - 52.0 %   MCV 90.9 78.0 - 100.0 fL   MCH 28.4 26.0 - 34.0 pg   MCHC 31.3 30.0 - 36.0 g/dL   RDW 16.8 (H) 11.5 - 15.5 %   Platelets 135 (L) 150 - 400 K/uL  Protime-INR     Status: Abnormal   Collection Time: 03/09/15  2:20 AM  Result Value Ref Range   Prothrombin Time 32.3 (H) 11.6 -  15.2 seconds   INR 3.23 (H) 0.00 - 1.49  Glucose, capillary     Status: Abnormal   Collection Time: 03/09/15  7:57 AM  Result Value Ref Range   Glucose-Capillary 110 (H) 65 - 99 mg/dL   Comment 1 Notify RN    Comment 2 Document in Chart   T4, free     Status: Abnormal   Collection Time: 03/09/15  9:55 AM  Result Value Ref Range   Free T4 0.45 (L) 0.61 - 1.12 ng/dL  Glucose, capillary     Status: Abnormal   Collection Time: 03/09/15 11:16 AM  Result Value Ref Range   Glucose-Capillary 120 (H) 65 - 99 mg/dL   Comment 1 Notify RN    Comment 2 Document in Chart   Renal function panel     Status: Abnormal   Collection Time: 03/09/15  3:30 PM  Result Value Ref Range   Sodium 137 135 - 145 mmol/L   Potassium 4.4 3.5 - 5.1 mmol/L   Chloride 94 (L) 101 - 111 mmol/L   CO2 21 (L) 22 - 32 mmol/L   Glucose, Bld 136 (H) 65 - 99 mg/dL   BUN 60 (H) 6 - 20 mg/dL   Creatinine, Ser 4.90 (H) 0.61 - 1.24 mg/dL   Calcium 7.7 (L) 8.9 - 10.3 mg/dL   Phosphorus 11.6 (H) 2.5 - 4.6 mg/dL   Albumin 2.3 (L) 3.5 - 5.0 g/dL   GFR calc non Af Amer 13 (L) >60 mL/min   GFR calc Af Amer 15 (L) >60 mL/min    Comment: (NOTE) The eGFR has been calculated using the CKD EPI equation. This calculation has not been validated in all clinical situations. eGFR's persistently <60 mL/min signify possible Chronic Kidney Disease.    Anion gap 22 (H) 5 - 15  CBC     Status: Abnormal   Collection Time: 03/09/15  3:30 PM  Result Value Ref Range   WBC 16.8 (H) 4.0 - 10.5 K/uL   RBC 3.98 (L) 4.22 - 5.81 MIL/uL   Hemoglobin 11.2 (L) 13.0 - 17.0 g/dL   HCT 35.9 (L) 39.0 - 52.0 %   MCV 90.2 78.0 - 100.0 fL   MCH 28.1 26.0 - 34.0 pg   MCHC 31.2 30.0 - 36.0 g/dL   RDW 16.6 (H) 11.5 - 15.5 %   Platelets 138 (L) 150 - 400 K/uL  Glucose, capillary     Status: Abnormal   Collection Time: 03/09/15  4:36 PM  Result Value Ref Range   Glucose-Capillary 107 (H) 65 - 99 mg/dL   Comment 1 Notify RN    Comment 2 Document in Chart     Recent Results (from the past  240 hour(s))  MRSA PCR Screening     Status: None   Collection Time: 03/08/15  6:37 PM  Result Value Ref Range Status   MRSA by PCR NEGATIVE NEGATIVE Final    Comment:        The GeneXpert MRSA Assay (FDA approved for NASAL specimens only), is one component of a comprehensive MRSA colonization surveillance program. It is not intended to diagnose MRSA infection nor to guide or monitor treatment for MRSA infections.     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 Penile calciphylaxis has been shown to be a poor prognostic indicator for overall survival.   Plan:  - Recommend continued topical wound care to glans of penis per Wound Care RNs - No indication for surgical intervention - this has not been shown to improve outcomes and often leads to chronic non-healing wounds - Recommend supportive medical care and optimization of ESRD and PVD - If further work up or biopsy is desired recommend dermatology consultation. However, do not recommend biopsy as this is unlikely to heal or change management.  - Follow up as needed in the future with urology - Urology will sign off  Acie Fredrickson 03/09/2015, 5:23 PM

## 2015-03-09 NOTE — Progress Notes (Signed)
ANTICOAGULATION CONSULT NOTE  Pharmacy Consult for coumadin Indication: atrial fibrillation  No Known Allergies  Patient Measurements: Height: 3\' 5"  (104.1 cm) Weight: 139 lb 11.2 oz (63.368 kg) IBW/kg (Calculated) : 6.3   Vital Signs: Temp: 97.6 F (36.4 C) (11/14 0758) Temp Source: Oral (11/14 0758) BP: 132/84 mmHg (11/14 0355) Pulse Rate: 82 (11/14 0355)  Labs:  Recent Labs  03/08/15 1133 03/08/15 1326 03/09/15 0220  HGB 12.4*  --  10.6*  HCT 39.2  --  33.9*  PLT 125*  --  135*  LABPROT  --  31.6* 32.3*  INR  --  3.12* 3.23*  CREATININE 4.28*  --  4.58*    Estimated Creatinine Clearance: 7.9 mL/min (by C-G formula based on Cr of 4.58).  Assessment: 49 yo with poor prognosis who presented to the ED with AMS. He has been on coumadin for afib, ordered to continue here. INR is supra-therapeutic today at 3.23. CBC stable, no bleeding noted.  PTA coumadin 2mg  PO qday  Goal of Therapy:  INR 2-3 Monitor platelets by anticoagulation protocol: Yes   Plan:  -hold warfarin tonight -daily INR -follow plans for meds now that transition to comfort care has been initiated per renal notes  Kyliyah Stirn D. Bev Drennen, PharmD, BCPS Clinical Pharmacist Pager: [phone] 03/09/2015 12:17 PM

## 2015-03-09 NOTE — Progress Notes (Signed)
Cardiologist: Dr. Rubie Maidroituro EP: Dr. Graciela HusbandsKlein  Subjective:  No CP, no SOB.  Now NSR  Objective:  Vital Signs in the last 24 hours: Temp:  [97.6 F (36.4 C)-98.6 F (37 C)] 97.6 F (36.4 C) (11/14 0758) Pulse Rate:  [28-114] 82 (11/14 0355) Resp:  [12-32] 22 (11/14 0355) BP: (116-158)/(61-123) 132/84 mmHg (11/14 0355) SpO2:  [91 %-99 %] 95 % (11/14 0155) Weight:  [139 lb 11.2 oz (63.368 kg)-153 lb (69.4 kg)] 139 lb 11.2 oz (63.368 kg) (11/13 1759)  Intake/Output from previous day: 11/13 0701 - 11/14 0700 In: 922.3 [P.O.:245; I.V.:677.3] Out: -    Physical Exam: General: Ill appearing, older than stated age in no acute distress. Head:  Normocephalic and atraumatic. Lungs: Clear to auscultation and percussion. Heart: Normal S1 and S2.  No murmur, rubs or gallops.  Abdomen: soft, non-tender, positive bowel sounds. Extremities: Amputations noted. Neurologic: Alert and oriented x 3.    Lab Results:  Recent Labs  03/08/15 1133 03/09/15 0220  WBC 13.3* 14.2*  HGB 12.4* 10.6*  PLT 125* 135*    Recent Labs  03/08/15 1133 03/09/15 0220  NA 137 136  K 4.5 4.3  CL 94* 95*  CO2 21* 19*  GLUCOSE 93 118*  BUN 44* 53*  CREATININE 4.28* 4.58*   No results for input(s): TROPONINI in the last 72 hours.  Invalid input(s): CK, MB Hepatic Function Panel  Recent Labs  03/08/15 1133  PROT 6.6  ALBUMIN 2.4*  AST 30  ALT 19  ALKPHOS 116  BILITOT 1.3*   No results for input(s): CHOL in the last 72 hours. No results for input(s): PROTIME in the last 72 hours.  Imaging: Dg Chest 1 View  03/08/2015  CLINICAL DATA:  Altered mental status, end-stage renal disease, hypoxia EXAM: CHEST 1 VIEW COMPARISON:  02/19/2015 FINDINGS: Right IJ dialysis catheter tips SVC RA junction as before. Prior coronary bypass noted. Marked cardiomegaly and low lung volumes with vascular congestion. Basilar atelectasis as before. No significant interval change. No large effusion or pneumothorax.  Trachea midline. No acute osseous finding. IMPRESSION: Very low lung volumes with marked cardiomegaly and vascular congestion Basilar atelectasis No significant interval change. Electronically Signed   By: Judie PetitM.  Shick M.D.   On: 03/08/2015 12:14   Personally viewed.   Telemetry: Now NSR. Personally viewed.   EKG:  Prior AFIB  Cardiac Studies:  ECHO EF 20%  Scheduled Meds: . calcium acetate  1,334 mg Oral TID WC  . docusate sodium  100 mg Oral BID  . feeding supplement (NEPRO CARB STEADY)  237 mL Oral TID BM  . feeding supplement (PRO-STAT SUGAR FREE 64)  30 mL Oral TID WC  . folic acid  1 mg Oral Daily  . gabapentin  100 mg Oral TID  . insulin aspart  0-15 Units Subcutaneous TID WC  . insulin aspart  0-5 Units Subcutaneous QHS  . Melatonin  3 mg Oral QHS  . [START ON 03/10/2015] midodrine  10 mg Oral Q T,Th,Sa-HD  . multivitamin-lutein  1 capsule Oral Daily  . oxyCODONE  20 mg Oral Q12H  . pantoprazole  40 mg Oral Daily  . sodium chloride  3 mL Intravenous Q12H  . Warfarin - Pharmacist Dosing Inpatient   Does not apply q1800   Continuous Infusions: . amiodarone 30 mg/hr (03/09/15 0400)   PRN Meds:.sodium chloride, acetaminophen **OR** acetaminophen, HYDROmorphone (DILAUDID) injection, ondansetron **OR** ondansetron (ZOFRAN) IV, oxyCODONE, polyethylene glycol, sodium chloride   Assessment/Plan:  Principal Problem:  Atrial fibrillation with rapid ventricular response (HCC) Active Problems:   Tobacco abuse   CAD (coronary artery disease)   Thrombocytopenia-chronic   Gastroesophageal reflux disease with esophagitis   CAD in native artery   Diabetes type 2, controlled (HCC)   Stage 3 skin ulcer of sacral region (HCC)   Protein-calorie malnutrition (HCC)   CKD (chronic kidney disease) stage V requiring chronic dialysis (HCC)   A-fib (HCC)   Acute encephalopathy  AFIB parox  - now NSR  - convert amiodarone PO 200 BID if able to take PO  - EP consult, Dr. Graciela Husbands says to  continue PO amiodarone but it is not currently on his active meds.   Cardiomyopathy  - EF 20%  - may not be able to handle beta blocker or ACE-I with ESRD, hypotension   PVD - bilat AKA  - stable  ESRD  - T, TH SAT dialysis  Very poor conditioning, poor overall long term prognosis.   Garnett Nunziata 03/09/2015, 8:45 AM

## 2015-03-09 NOTE — Care Management Note (Addendum)
Case Management Note  Patient Details  Name: [name] MRN: [id number] Date of Birth: 1966-01-10  Subjective/Objective: Pt admitted for A Fib. Initiated on IV Amiodarone gtt.  Hx ESRD T, TH SAT dialysis schedule. [facility]                    Action/Plan: CSW to continue to assist with disposition needs. CM will continue to monitor as well.    Expected Discharge Date:                  Expected Discharge Plan:  Skilled Nursing Facility  In-House Referral:  Clinical Social Work  Discharge planning Services  CM Consult  Post Acute Care Choice:  NA Choice offered to:  NA  DME Arranged:  N/A DME Agency:  NA  HH Arranged:  NA HH Agency:  NA  Status of Service:  Completed.  Medicare Important Message Given:    Date Medicare IM Given:    Medicare IM give by:    Date Additional Medicare IM Given:    Additional Medicare Important Message give by:     If discussed at Long Length of Stay Meetings, dates discussed:    Additional Comments: 03-10-15 [address] Tomi BambergerBrenda Graves-Bigelow, RN,BSN [phone] Pt discussed in progression- [facility]. CM did relay information to CSW. CSW to assist with disposition needs. No further needs from CM at this time.   Gala LewandowskyGraves-Bigelow, Nigil Braman Kaye, RN 03/09/2015, 10:35 AM

## 2015-03-09 NOTE — Consult Note (Addendum)
WOC wound consult note Reason for Consult: Consult requested for multiple wounds.   Wound type: Penis with 2 necrotic areas on each side of the tip; each is 2X1cm, 100% eschar, no open wounds, odor, or drainage. Left arm with 2 partial thickness abrasions; 1X1X.1cm and .5X.5X.1cm, both with small amt blood-tinged drainage and dark red wound bed. Right ischium with deep tissue injury' 1X1cm dark purple, no open wound or drainage. Sacrum with .5X.5cm dry scabbed healing stage 2 pressure injury, no odor or drainage. Right stump with full thickness wound, ,2X,2X.3cm, 100% red and moist, small mat pink drainage, no odor. Pressure Ulcer POA: Yes Dressing procedure/placement/frequency: Foam dressing to promote healing to left arm, right ischium, right stump, and sacrum. Pt is on an air mattress to reduce pressure. Pt has 2 necrotic areas to penis; [facility]. Please consult urology for further plan of care.  Please re-consult if further assistance is needed.  Thank-you,  Cammie Mcgeeawn Carrin Vannostrand MSN, RN, CWOCN, [place]WCN-AP, CNS [phone]

## 2015-03-09 NOTE — Progress Notes (Addendum)
Patient Demographics:    [name], is a 49 y.o. male, DOB - January 17, 1966, HYQ:[id number]  Admit date - 03/08/2015   Admitting Physician Calvert Cantor, MD  Outpatient Primary MD for the patient is [name], NP  LOS - 1   Chief Complaint  Patient presents with  . Altered Mental Status        Subjective:    [name] today has, No headache, No chest pain, No abdominal pain - No Nausea, No new weakness tingling or numbness, No Cough - SOB. Chronic back pain.   Assessment  & Plan :   1. Chronic atrial fibrillation with RVR. [place] Vasc score >3 - cardiology on board, goal will be rate controlled, currently being switched to oral amiodarone and cardiology recommendation, Coumadin per pharmacy.    2.ESRD. On dialysis, renal consult it.    3. Ileus. Abdomen distended, nothing by mouth except medications, Colace and soapsuds enema. Monitor.    4. CAD, PAD, bilateral AKA. Extremely poor quality of life, discussed with patient's sister who is primary caregiver, her entry no acute issues from the standpoint continue anticoagulation, supportive care.    5. 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 Wound care Consulted.    6.Acute encephalopathy upon admission. Resolved. Normal B-12 and ammonia levels. No headaches supple neck.     7. GERD. On PPI continue.    8. Diabetic peripheral neuropathy. On Neurontin low-dose continue.    9. DM type II. Continue on sliding scale insulin continue.    10. Mild nonspecific leukocytosis. Chest x-ray clear, could be due to ileus and reactionary. We will monitor. Monitor temperature curve currently afebrile and nontoxic appearing. Monitor decubitus ulcers closely.   11. Necrotic areas around the penis. Will consult urology.   12. High TSH. Could be  sick euthyroid syndrome. Will check TSH along with free T4 and T3.    Code Status : No intubation  Family Communication  : Sister primary caregiver over the phone on 03/09/2015. Limited code. Understands poor quality of life. Agreeable to palliative care input.  Addendum was called by nephrology team, they had another discussion with family, family now agreeable to stopping dialysis and focusing on hospice and comfort care.  Disposition Plan  : SNF 3-4 days  Consults  :  Cards, Pall Care, Renal, urology  Procedures  :    DVT Prophylaxis  :  Coumadin  Lab Results  Component Value Date   PLT 135* 03/09/2015    Inpatient Medications  Scheduled Meds: . amiodarone  200 mg Oral BID  . calcium acetate  1,334 mg Oral TID WC  . docusate sodium  200 mg Oral BID  . feeding supplement (NEPRO CARB STEADY)  237 mL Oral TID BM  . feeding supplement (PRO-STAT SUGAR FREE 64)  30 mL Oral TID WC  . folic acid  1 mg Oral Daily  . gabapentin  100 mg Oral TID  . insulin aspart  0-15 Units Subcutaneous TID WC  . insulin aspart  0-5 Units Subcutaneous QHS  . Melatonin  3 mg Oral QHS  . methimazole  20 mg Oral BID  . [START ON 03/10/2015] midodrine  10 mg Oral Q T,Th,Sa-HD  . multivitamin-lutein  1 capsule Oral Daily  .  oxyCODONE  20 mg Oral Q12H  . pantoprazole  40 mg Oral Daily  . sodium chloride  3 mL Intravenous Q12H  . Warfarin - Pharmacist Dosing Inpatient   Does not apply q1800   Continuous Infusions:  PRN Meds:.sodium chloride, acetaminophen **OR** acetaminophen, HYDROmorphone (DILAUDID) injection, ondansetron **OR** ondansetron (ZOFRAN) IV, oxyCODONE, polyethylene glycol, sodium chloride  Antibiotics  :    Anti-infectives    None        Objective:   Filed Vitals:   03/08/15 2200 03/09/15 0155 03/09/15 0355 03/09/15 0758  BP: 122/75 130/61 132/84   Pulse: 111 84 82   Temp:   98.6 F (37 C) 97.6 F (36.4 C)  TempSrc:   Oral Oral  Resp: 23 22 22    Height:      Weight:       SpO2: 96% 95%      Wt Readings from Last 3 Encounters:  03/08/15 63.368 kg (139 lb 11.2 oz)  01/27/15 66.225 kg (146 lb)  01/19/15 67.586 kg (149 lb)     Intake/Output Summary (Last 24 hours) at 03/09/15 1044 Last data filed at 03/09/15 0900  Gross per 24 hour  Intake 1042.26 ml  Output      0 ml  Net 1042.26 ml     Physical Exam  Awake Alert, Oriented X 3, No new F.N deficits, Normal affect [place].AT,PERRAL Supple Neck,No JVD, No cervical lymphadenopathy appriciated.  Symmetrical Chest wall movement, Good air movement bilaterally, CTAB RRR,No Gallops,Rubs or new Murmurs, No Parasternal Heave +ve B.Sounds, Abd Soft, No tenderness, No organomegaly appriciated, No rebound - guarding or rigidity. No Cyanosis, Clubbing or edema, No new Rash or bruise , bilateral BKA Ulcers on the left BKA stump, bilateral stage 3-4  Hip decubitus ulcers kindly see wound care note for all details    Data Review:   Micro Results Recent Results (from the past 240 hour(s))  MRSA PCR Screening     Status: None   Collection Time: 03/08/15  6:37 PM  Result Value Ref Range Status   MRSA by PCR NEGATIVE NEGATIVE Final    Comment:        The GeneXpert MRSA Assay (FDA approved for NASAL specimens only), is one component of a comprehensive MRSA colonization surveillance program. It is not intended to diagnose MRSA infection nor to guide or monitor treatment for MRSA infections.     Radiology Reports Dg Chest 1 View  03/08/2015  CLINICAL DATA:  Altered mental status, end-stage renal disease, hypoxia EXAM: CHEST 1 VIEW COMPARISON:  02/19/2015 FINDINGS: Right IJ dialysis catheter tips SVC RA junction as before. Prior coronary bypass noted. Marked cardiomegaly and low lung volumes with vascular congestion. Basilar atelectasis as before. No significant interval change. No large effusion or pneumothorax. Trachea midline. No acute osseous finding. IMPRESSION: Very low lung volumes with marked  cardiomegaly and vascular congestion Basilar atelectasis No significant interval change. Electronically Signed   By: Judie PetitM.  Shick M.D.   On: 03/08/2015 12:14   Dg Chest Port 1 View  02/19/2015  CLINICAL DATA:  Hemoptysis EXAM: PORTABLE CHEST 1 VIEW COMPARISON:  12/08/2014 FINDINGS: Cardiomediastinal silhouette is stable. Status post CABG. Dual lumen right IJ catheter with tip in right atrium is unchanged in position. Again noted poor inspiration 1 basilar atelectasis. No acute infiltrate or pulmonary edema. IMPRESSION: Poor inspiration with mild basilar atelectasis. No acute infiltrate or pulmonary edema. Electronically Signed   By: Natasha MeadLiviu  Pop M.D.   On: 02/19/2015 11:58   Dg Abd  Portable 1v  03/09/2015  CLINICAL DATA:  Diffuse abdominal pain with distention and nausea. EXAM: PORTABLE ABDOMEN - 1 VIEW COMPARISON:  None. FINDINGS: Single minimally motion degraded supine view of the abdomen and pelvis. Mild to moderate gastric distension. Gas-filled small bowel loops, maximally 3.3 cm. Gas and stool within the ascending colon. Pelvis excluded. No free intraperitoneal air. IMPRESSION: Gaseous and less so small bowel distension, mild. Stool within the ascending colon. Findings may relate to a mild adynamic ileus. Low-grade partial small bowel obstruction cannot be excluded. Limitations, including motion and exclusion of the pelvis. Electronically Signed   By: Jeronimo Greaves M.D.   On: 03/09/2015 10:09     CBC  Recent Labs Lab 03/08/15 1133 03/09/15 0220  WBC 13.3* 14.2*  HGB 12.4* 10.6*  HCT 39.2 33.9*  PLT 125* 135*  MCV 91.0 90.9  MCH 28.8 28.4  MCHC 31.6 31.3  RDW 16.8* 16.8*  LYMPHSABS 0.4*  --   MONOABS 0.8  --   EOSABS 0.0  --   BASOSABS 0.0  --     Chemistries   Recent Labs Lab 03/08/15 1133 03/09/15 0220  NA 137 136  K 4.5 4.3  CL 94* 95*  CO2 21* 19*  GLUCOSE 93 118*  BUN 44* 53*  CREATININE 4.28* 4.58*  CALCIUM 8.4* 7.9*  AST 30  --   ALT 19  --   ALKPHOS 116  --     BILITOT 1.3*  --    ------------------------------------------------------------------------------------------------------------------ estimated creatinine clearance is 7.9 mL/min (by C-G formula based on Cr of 4.58). ------------------------------------------------------------------------------------------------------------------ No results for input(s): HGBA1C in the last 72 hours. ------------------------------------------------------------------------------------------------------------------ No results for input(s): CHOL, HDL, LDLCALC, TRIG, CHOLHDL, LDLDIRECT in the last 72 hours. ------------------------------------------------------------------------------------------------------------------  Recent Labs  03/08/15 1840  TSH 10.704*   ------------------------------------------------------------------------------------------------------------------  Recent Labs  03/08/15 1840  VITAMINB12 2184*    Coagulation profile  Recent Labs Lab 03/08/15 1326 03/09/15 0220  INR 3.12* 3.23*    No results for input(s): DDIMER in the last 72 hours.  Cardiac Enzymes No results for input(s): CKMB, TROPONINI, MYOGLOBIN in the last 168 hours.  Invalid input(s): CK ------------------------------------------------------------------------------------------------------------------ Invalid input(s): POCBNP   Time Spent in minutes 35   Iyonna Rish K M.D on 03/09/2015 at 10:44 AM  Between 7am to 7pm - Pager - [phone]  After 7pm go to [email or web address] - password [facility]  [facility] -  Office  [phone]

## 2015-03-10 LAB — T3: T3 TOTAL: 35 ng/dL — AB (ref 71–180)

## 2015-03-10 LAB — GLUCOSE, CAPILLARY: Glucose-Capillary: 110 mg/dL — ABNORMAL HIGH (ref 65–99)

## 2015-03-10 MED ORDER — HYDROMORPHONE HCL 4 MG PO TABS: 4.0000 mg | ORAL_TABLET | ORAL | Status: AC | PRN

## 2015-03-10 MED ORDER — HYDROMORPHONE HCL 1 MG/ML IJ SOLN
1.0000 mg | Freq: Once | INTRAMUSCULAR | Status: AC
  Administered 2015-03-10: 1 mg via INTRAVENOUS
  Filled 2015-03-10: qty 1

## 2015-03-10 MED ORDER — LORAZEPAM 2 MG/ML PO CONC: 1.0000 mg | Freq: Four times a day (QID) | ORAL | Status: AC | PRN

## 2015-03-10 NOTE — Progress Notes (Signed)
Patient is not appropriate for discharge to SNF. Patient must be discharged to a hospice facility due to high level of complex symptom management needs.

## 2015-03-10 NOTE — Clinical Documentation Improvement (Signed)
Internal Medicine Palliative Care  Can the diagnosis of Malnutrition be further specified?   Document Severity - Severe(third degree), Moderate (second degree), Mild (first degree)  Other condition  Unable to clinically determine  Supporting Information: Protein calorie malnutrition currently documented in chart.  Nursing flowsheets reflect height 3 feet 5 inches, weight 139 pounds, 11.2 ounces, BMI 58.6 (hx of bilateral BKAs).   Please exercise your independent, professional judgment when responding. A specific answer is not anticipated or expected.Please update your documentation within the medical record to reflect your response to this query.  Thank you, Doy MinceVangela Ilisha Blust, RN [phone] Clinical Documentation Specialist

## 2015-03-10 NOTE — Discharge Summary (Signed)
[name], is a 49 y.o. male  DOB 03/15/1966  MRN [id number].  Admission date:  03/08/2015  Admitting Physician  Calvert CantorSaima Rizwan, MD  Discharge Date:  03/10/2015   Primary MD  Dema SeverinYORK,REGINA F, NP  Recommendations for primary care physician for things to follow:   Goal of care is full comfort wall. Palliative care/hospice. No further dialysis   Admission Diagnosis  Pain [R52] CKD (chronic kidney disease), unspecified stage [N18.9] Altered mental status, unspecified altered mental status type [R41.82] Congestive heart failure, unspecified congestive heart failure chronicity, unspecified congestive heart failure type (HCC) [I50.9] Atrial fibrillation, unspecified type (HCC) [I48.91]   Discharge Diagnosis  Pain [R52] CKD (chronic kidney disease), unspecified stage [N18.9] Altered mental status, unspecified altered mental status type [R41.82] Congestive heart failure, unspecified congestive heart failure chronicity, unspecified congestive heart failure type (HCC) [I50.9] Atrial fibrillation, unspecified type (HCC) [I48.91]    Principal Problem:   Atrial fibrillation with rapid ventricular response (HCC) Active Problems:   Tobacco abuse   CAD (coronary artery disease)   Thrombocytopenia-chronic   Gastroesophageal reflux disease with esophagitis   CAD in native artery   Diabetes type 2, controlled (HCC)   Stage 3 skin ulcer of sacral region (HCC)   Protein-calorie malnutrition (HCC)   CKD (chronic kidney disease) stage V requiring chronic dialysis (HCC)   A-fib (HCC)   Acute encephalopathy      Past Medical History  Diagnosis Date  . Coronary artery disease 07/05/2011    2D ECHO - EF ~40%, moderate concentric LV hypertrophy, LA moderately dilated, mild-moderate septal and inferior wall hypokinesis  .  Hypertension   . CHF (congestive heart failure) (HCC)   . Diabetes mellitus     Insulin dependent  . Cardiomyopathy (HCC)   . Claudication (HCC) 05/09/2011    Right and left anterior tibial arteries and Left SFA-occluded; Right CIA-<50% diameter reduction; Right Deep Profunda-70-99% diameter reduction; Right SFA->60% diameter reduction; Right Distal Popliteal/Tibial Artery- >60% diameter reduction; Left CFA and Profunda- >50% diameter reduction  . 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 GERD (gastroesophageal reflux disease)   . Peripheral vascular disease (HCC)   . 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 Chronic kidney disease (CKD), stage IV (severe) (HCC)     Dialysis - t/th/Sa    Past Surgical History  Procedure Laterality Date  . Cardiac catheterization  05/07/2008    CABG  . Coronary artery bypass graft  06/2008    x3  . Insertion of dialysis catheter Right 10/01/2014    Procedure: INSERTION OF RIGHT INTERNAL JUGULAR DIALYSIS CATHETER;  Surgeon: Larina Earthlyodd F Early, MD;  [facility];  Service: Vascular;  Laterality: Right;  . Amputation Right 10/06/2014    Procedure: AMPUTATION ABOVE KNEE Right;  Surgeon: Sherren Kernsharles E Fields, MD;  Location: [facility] OR;  Service: Vascular;  Laterality: Right;  . Av fistula placement Left 10/10/2014    Procedure: LEFT BRACHIAL TO CEPHALIC ARTERIOVENOUS (AV) FISTULA CREATION;  Surgeon: Larina Earthly, MD;  Location: [facility] OR;  Service: Vascular;  Laterality: Left;  . Amputation Right 11/26/2014    Procedure: REVISION AMPUTATION ABOVE KNEE;  Surgeon: Sherren Kerns, MD;  Location: [facility] OR;  Service: Vascular;  Laterality: Right;  . Peripheral vascular catheterization N/A 12/12/2014    Procedure: Abdominal Aortogram;  Surgeon: Sherren Kerns, MD;   Location: [facility] INVASIVE CV LAB;  Service: Cardiovascular;  Laterality: N/A;  . Amputation Left 12/31/2014    Procedure: AMPUTATION ABOVE KNEE;  Surgeon: Larina Earthly, MD;  Location: [facility] OR;  Service: Vascular;  Laterality: Left;       HPI  from the history and physical done on the day of admission:    [name] is a 49 y.o. male with past medical history that includes A. Fib on Coumadin, PVD status post bilateral above-the-knee amputations, CAD, substance abuse, hypertension, diabetes, chronic kidney disease on hemodialysis Tuesday Thursday and Saturday who presents to the ED via EMS from a facility with the chief complaint of altered mental status. Initial evaluation in the emergency department review reveals A. Fib with rapid ventricular response, mild leukocytosis  Staff at the facility reported that patient had been more agitated and he had low oxygen readings. No report of recent fever chills nausea vomiting diarrhea. Patient denies any chest pain palpitations headache syncope or near-syncope. He denies any shortness of breath cough abdominal pain. He does report worsening pain related to his pressure ulcer. He recalls calling EMS as his pain was not controlled at the facility.  Workup in the emergency department includes comprehensive metabolic panel significant for chloride of 94 CO2 of 21 creatinine of 4.28 which appears to be above his baseline, complete blood count mild leukocytosis 13.3 hemoglobin of 12.4 platelets 125, INR 3.12. BNP greater than 4500. Chest x-ray very low lung volumes with marked cardiomegaly and vascular congestion. Basilar atelectasis. No significant interval change. EKG with A. Fib and RVR.  In the emergency department he is afebrile hemodynamically stable and not hypoxic. Amiodarone drip was initiated and cardiology is called for consultation.  Overall extremely poor quality of life, seen by cardiology and renal, after detailed discussion with patient and family was  decided that all aggressive measures and treatments will be stopped and he will be full hospice henceforth. Dialysis has been stopped as well. Goal of care is full comfort.   Hospital Course:    1. Chronic atrial fibrillation with RVR. [place] Vasc score >3 - cardiology on board, goal will be rate controlled, continue amiodarone strop Coumadin as he is hospice.    2.ESRD. Was on dialysis which is now stopped as he is for hospice.   3. Ileus. Abdomen distended, improved after he was disimpacted, and 2 new stool softeners and monitor clinically.   4. CAD, PAD, bilateral AKA. Extremely poor quality of life, discussed with patient's sister and palliative care, also nephrology involved in discussions. No decision is DO NOT RESUSCITATE with full hospice.    5. Bilateral hip stage III but his ulcers with chronic low back pain. Wound care Consulted, continue wound care at SNF directed towards comfort. Continue pain control.   6.Acute encephalopathy upon admission. Resolved. Normal B-12 and ammonia levels. No headaches supple neck.     7. GERD. On PPI continue.    8. Diabetic peripheral neuropathy. Supportive care. Neurontin  held skin cause encephalopathy with stopping of dialysis in an ESRD patient with worsening renal function.   9. DM type II. Stop insulin sliding scale. Goals of care comfort.    10. Mild nonspecific leukocytosis. Chest x-ray clear, could be due to ileus and reactionary. No goal of care is comfort only.   11. Necrotic areas around the penis. Urology was consulted, recommended local wound care.   12. High TSH. Could be sick euthyroid syndrome. Now hospice no further workup.     Discharge Condition: Guarded  Follow UP  Follow-up Information    Follow up with [name], NP. Schedule an appointment as soon as possible for a visit in 1 week.   Contact information:   [address] [place] [address] [phone]        Consults obtained - cardiology,  renal, palliative care, urology  Diet and Activity recommendation: See Discharge Instructions below  Discharge Instructions       Discharge Instructions    Discharge instructions    Complete by:  As directed   Activity -  As tolerated with Full fall precautions use walker/cane & assistance as needed   Disposition residential hospice versus SNF with palliative care and comfort measures   Diet: As desired with feeding assistance and aspiration precautions.   Goal of care is now comfort/ hospice.     Increase activity slowly    Complete by:  As directed              Discharge Medications       Medication List    STOP taking these medications        calcium acetate 667 MG capsule  Commonly known as:  PHOSLO     Darbepoetin Alfa 100 MCG/0.5ML Sosy injection  Commonly known as:  ARANESP     DECUBI-VITE PO     feeding supplement (NEPRO CARB STEADY) Liqd     folic acid 1 MG tablet  Commonly known as:  FOLVITE     gabapentin 100 MG capsule  Commonly known as:  NEURONTIN     insulin aspart 100 UNIT/ML injection  Commonly known as:  novoLOG     midodrine 10 MG tablet  Commonly known as:  PROAMATINE     oxyCODONE 15 mg 12 hr tablet  Commonly known as:  OXYCONTIN     OXYCODONE HCL PO     OXYCONTIN 20 mg 12 hr tablet  Generic drug:  oxyCODONE     warfarin 2 MG tablet  Commonly known as:  COUMADIN      TAKE these medications        amiodarone 400 MG tablet  Commonly known as:  PACERONE  Take 1 tablet (400 mg total) by mouth daily.     docusate sodium 100 MG capsule  Commonly known as:  COLACE  Take 1 capsule (100 mg total) by mouth 2 (two) times daily. Hold for diarrhea     feeding supplement (PRO-STAT SUGAR FREE 64) Liqd  Take 30 mLs by mouth 3 (three) times daily with meals. Ok to mixed it out with juice or any other drinks to facilitate intake     HYDROmorphone 4 MG tablet  Commonly known as:  DILAUDID  Take 1 tablet (4 mg total) by mouth every  4 (four) hours as needed for moderate pain or severe pain.     LORazepam 2 MG/ML concentrated solution  Commonly known as:  ATIVAN  Take 0.5 mLs (1 mg total) by mouth  every 6 (six) hours as needed for anxiety.     Melatonin 3 MG Tabs  Take 3 mg by mouth at bedtime.     nitroGLYCERIN 0.4 MG SL tablet  Commonly known as:  NITROSTAT  Place 1 tablet (0.4 mg total) under the tongue once.     pantoprazole 40 MG tablet  Commonly known as:  PROTONIX  Take 40 mg by mouth daily.     polyethylene glycol packet  Commonly known as:  MIRALAX / GLYCOLAX  Take 17 g by mouth daily as needed for moderate constipation.        Major procedures and Radiology Reports - PLEASE review detailed and final reports for all details, in brief -       Dg Chest 1 View  03/08/2015  CLINICAL DATA:  Altered mental status, end-stage renal disease, hypoxia EXAM: CHEST 1 VIEW COMPARISON:  02/19/2015 FINDINGS: Right IJ dialysis catheter tips SVC RA junction as before. Prior coronary bypass noted. Marked cardiomegaly and low lung volumes with vascular congestion. Basilar atelectasis as before. No significant interval change. No large effusion or pneumothorax. Trachea midline. No acute osseous finding. IMPRESSION: Very low lung volumes with marked cardiomegaly and vascular congestion Basilar atelectasis No significant interval change. Electronically Signed   By: Judie Petit.  Shick M.D.   On: 03/08/2015 12:14   Dg Chest Port 1 View  02/19/2015  CLINICAL DATA:  Hemoptysis EXAM: PORTABLE CHEST 1 VIEW COMPARISON:  12/08/2014 FINDINGS: Cardiomediastinal silhouette is stable. Status post CABG. Dual lumen right IJ catheter with tip in right atrium is unchanged in position. Again noted poor inspiration 1 basilar atelectasis. No acute infiltrate or pulmonary edema. IMPRESSION: Poor inspiration with mild basilar atelectasis. No acute infiltrate or pulmonary edema. Electronically Signed   By: Natasha Mead M.D.   On: 02/19/2015 11:58   Dg Abd  Portable 1v  03/09/2015  CLINICAL DATA:  Diffuse abdominal pain with distention and nausea. EXAM: PORTABLE ABDOMEN - 1 VIEW COMPARISON:  None. FINDINGS: Single minimally motion degraded supine view of the abdomen and pelvis. Mild to moderate gastric distension. Gas-filled small bowel loops, maximally 3.3 cm. Gas and stool within the ascending colon. Pelvis excluded. No free intraperitoneal air. IMPRESSION: Gaseous and less so small bowel distension, mild. Stool within the ascending colon. Findings may relate to a mild adynamic ileus. Low-grade partial small bowel obstruction cannot be excluded. Limitations, including motion and exclusion of the pelvis. Electronically Signed   By: Jeronimo Greaves M.D.   On: 03/09/2015 10:09    Micro Results      Recent Results (from the past 240 hour(s))  MRSA PCR Screening     Status: None   Collection Time: 03/08/15  6:37 PM  Result Value Ref Range Status   MRSA by PCR NEGATIVE NEGATIVE Final    Comment:        The GeneXpert MRSA Assay (FDA approved for NASAL specimens only), is one component of a comprehensive MRSA colonization surveillance program. It is not intended to diagnose MRSA infection nor to guide or monitor treatment for MRSA infections.     Today   Subjective    [name] today has no headache,no chest abdominal pain,no new weakness tingling or numbness, continues to have low back pain which is chronic and constant   Objective   Blood pressure 133/86, pulse 81, temperature 98.1 F (36.7 C), temperature source Oral, resp. rate 15, height  (1.041 m), weight 69.264 kg (152 lb 11.2 oz), SpO2 93 %.  Intake/Output Summary (Last 24 hours) at 03/10/15 0951 Last data filed at 03/10/15 0400  Gross per 24 hour  Intake  473.3 ml  Output      0 ml  Net  473.3 ml    Exam Awake  , Oriented X 2, No new F.N deficits, Normal affect [place].AT,PERRAL Supple Neck,No JVD, No cervical lymphadenopathy appriciated.  Symmetrical Chest wall  movement, Good air movement bilaterally, CTAB RRR,No Gallops,Rubs or new Murmurs, No Parasternal Heave +ve B.Sounds, Abd Soft, No tenderness, No organomegaly appriciated, No rebound - guarding or rigidity. No Cyanosis, Clubbing or edema, No new Rash or bruise , bilateral BKA Ulcers on the left BKA stump, bilateral stage 3-4 Hip decubitus ulcers kindly see wound care note for all details   Data Review   CBC w Diff: Lab Results  Component Value Date   WBC 16.8* 03/09/2015   HGB 11.2* 03/09/2015   HCT 35.9* 03/09/2015   HCT 30.5* 03/08/2015   PLT 138* 03/09/2015   LYMPHOPCT 3 03/08/2015   MONOPCT 6 03/08/2015   EOSPCT 0 03/08/2015   BASOPCT 0 03/08/2015    CMP: Lab Results  Component Value Date   NA 137 03/09/2015   K 4.4 03/09/2015   CL 94* 03/09/2015   CO2 21* 03/09/2015   BUN 60* 03/09/2015   CREATININE 4.90* 03/09/2015   PROT 6.6 03/08/2015   ALBUMIN 2.3* 03/09/2015   BILITOT 1.3* 03/08/2015   ALKPHOS 116 03/08/2015   AST 30 03/08/2015   ALT 19 03/08/2015  .   Total Time in preparing paper work, data evaluation and todays exam - 35 minutes  Leroy Sea M.D on 03/10/2015 at 9:51 AM  [facility]   Office  [phone]

## 2015-03-10 NOTE — Clinical Social Work Note (Signed)
CSW spoke with Dr. Phillips OdorGolding regarding the pt's discharge plan. Dr. Phillips OdorGolding informed CSW that [facility]. Dr. Phillips OdorGolding shared the apporiate level of care is residential house. CSW spoke with the pt's sister Dory HornSherri Price regarding [facility]. Sherri has agreed. CSW spoke with [facility] hosp[facility]. CSW informed Carley Hammedva that Roanna RaiderSherri is accepting a call from her. CSW will continue to aid the family and pt with their discharge needs.     Clinical Social Worker  Ronella Plunk, MSW, LCSW [phone]

## 2015-03-10 NOTE — Progress Notes (Signed)
Patient complaining of pain in buttocks, will reposition and continue to monitor. Patient has ecchymotic areas to bilateral arms and to bilateral stump areas with mepilex dressing to stumps, sacrum and bilateral hip areas that remain clean, dry and intact. Patient's scrotum is extremely edemadous as well as his abdomen, scrotum elevated on a rolled towel, will continue to monitor. Patient has 2 neucrotic areas to penus head on right and left side open to air. Patient has a fluid filled blister on his left hip/upper thigh area that is open to air, will continue to monitor.

## 2015-03-10 NOTE — NC FL2 (Signed)
[place] MEDICAID FL2 LEVEL OF CARE SCREENING TOOL     IDENTIFICATION  Patient Name: [name] Birthdate: 03-06-1966 Sex: male Admission Date (Current Location): 03/08/2015  [place] and [place] Number: [facility] [address] [id number] Facility and Address:  The . [facility], [address], [place], [place] [address]      Provider Number:    Attending Physician Name and Address:  Leroy Sea, MD  Relative Name and Phone Number:  Dory Horn [phone])    Current Level of Care: Hospital Recommended Level of Care: Skilled Nursing Facility Prior Approval Number:    Date Approved/Denied:   PASRR Number:   [id number]  Discharge Plan: SNF    Current Diagnoses: Patient Active Problem List   Diagnosis Date Noted  . Atrial fibrillation with rapid ventricular response (HCC) 03/08/2015  . A-fib (HCC) 03/08/2015  . Acute encephalopathy 03/08/2015  . Supratherapeutic INR 12/30/2014  . Atrial fibrillation on Coumadin 12/30/2014  . History of ventricular tachycardia 12/30/2014  . Non-healing wound of lower extremity 12/08/2014  . CKD (chronic kidney disease) stage V requiring chronic dialysis (HCC) 12/02/2014  . Anemia 12/02/2014  . Non-healing amputation site (HCC) 11/25/2014  . Stage 3 skin ulcer of sacral region (HCC) 10/22/2014  . Protein-calorie malnutrition (HCC) 10/22/2014  . Pressure ulcer 10/03/2014  . Gangrene of foot/LEFT   . Protein-calorie malnutrition, severe (HCC) 09/29/2014  . Diabetes type 2, controlled (HCC)   . Peripheral artery disease (HCC)   . CAD in native artery   . HLD (hyperlipidemia)   . Chronic combined systolic and diastolic CHF (congestive heart failure) (HCC)   . Gastroesophageal reflux disease with esophagitis   . Thrombocytopenia-chronic 03/22/2011  . 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 Tobacco abuse 03/19/2011   . Marijuana abuse 03/19/2011  . 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 CAD (coronary artery disease) 03/19/2011  . NSTEMI (non-ST elevated myocardial infarction) (HCC) 03/19/2011    Orientation ACTIVITIES/SOCIAL BLADDER RESPIRATION    Self, Place  Active Continent O2 (As needed) (2L)  BEHAVIORAL SYMPTOMS/MOOD NEUROLOGICAL BOWEL NUTRITION STATUS      Continent Diet (Heart Healthy)  PHYSICIAN VISITS COMMUNICATION OF NEEDS Height & Weight Skin    Verbally  (104.1 cm) 152 lbs. PU Stage and Appropriate Care, Other (Comment) (Deep Tissue Injury (Ischial Tuberosity))   PU Stage 2 Dressing:  (Change every 5 days)      AMBULATORY STATUS RESPIRATION    Assist extensive O2 (As needed) (2L)      Personal Care Assistance Level of Assistance  Total care       Total Care Assistance: Maximum assistance    Functional Limitations Info                SPECIAL CARE FACTORS FREQUENCY                      Additional Factors Info  Code Status, Allergies Code Status Info: DNR Allergies Info: NKDA           Current Medications (03/10/2015): Current Facility-Administered Medications  Medication Dose Route Frequency Provider Last Rate Last Dose  . amiodarone (PACERONE) tablet 200 mg  200 mg Oral BID Leroy Sea, MD   200 mg at 03/10/15 0800  . docusate sodium (COLACE) capsule 200 mg  200 mg Oral BID Leroy Sea, MD   200 mg at 03/10/15  0801  . gabapentin (NEURONTIN) capsule 100 mg  100 mg Oral TID Gwenyth Bender, NP   100 mg at 03/10/15 0801  . HYDROmorphone (DILAUDID) injection 1 mg  1 mg Intravenous Q2H PRN Romie Minus, MD   1 mg at 03/10/15 0757  . lidocaine (PF) (XYLOCAINE) 1 % injection 5 mL  5 mL Intradermal PRN Pola Corn, NP      . lidocaine-prilocaine (EMLA) cream 1 application  1 application Topical PRN Pola Corn, NP      . Melatonin TABS 3 mg  3 mg Oral QHS Gwenyth Bender, NP   3 mg at 03/09/15 2217  . multivitamin-lutein  (OCUVITE-LUTEIN) capsule 1 capsule  1 capsule Oral QHS Leroy Sea, MD      . ondansetron [facility]) injection 4 mg  4 mg Intravenous Q6H PRN Gwenyth Bender, NP      . oxyCODONE (OXYCONTIN) 12 hr tablet 20 mg  20 mg Oral Q12H Gwenyth Bender, NP   20 mg at 03/10/15 0757  . oxyCODONE (ROXICODONE) 5 MG/5ML solution 5 mg  5 mg Oral Q4H PRN Gwenyth Bender, NP   5 mg at 03/10/15 0354  . pantoprazole (PROTONIX) EC tablet 40 mg  40 mg Oral Daily Gwenyth Bender, NP   40 mg at 03/10/15 0801  . pentafluoroprop-tetrafluoroeth (GEBAUERS) aerosol 1 application  1 application Topical PRN Pola Corn, NP      . polyethylene glycol (MIRALAX / GLYCOLAX) packet 17 g  17 g Oral Daily PRN Gwenyth Bender, NP   17 g at 03/10/15 0804   Do not use this list as official medication orders. Please verify with discharge summary.  Discharge Medications:   Medication List    STOP taking these medications        calcium acetate 667 MG capsule  Commonly known as:  PHOSLO     Darbepoetin Alfa 100 MCG/0.5ML Sosy injection  Commonly known as:  ARANESP     DECUBI-VITE PO     feeding supplement (NEPRO CARB STEADY) Liqd     folic acid 1 MG tablet  Commonly known as:  FOLVITE     gabapentin 100 MG capsule  Commonly known as:  NEURONTIN     insulin aspart 100 UNIT/ML injection  Commonly known as:  novoLOG     midodrine 10 MG tablet  Commonly known as:  PROAMATINE     oxyCODONE 15 mg 12 hr tablet  Commonly known as:  OXYCONTIN     OXYCODONE HCL PO     OXYCONTIN 20 mg 12 hr tablet  Generic drug:  oxyCODONE     warfarin 2 MG tablet  Commonly known as:  COUMADIN      TAKE these medications        amiodarone 400 MG tablet  Commonly known as:  PACERONE  Take 1 tablet (400 mg total) by mouth daily.     docusate sodium 100 MG capsule  Commonly known as:  COLACE  Take 1 capsule (100 mg total) by mouth 2 (two) times daily. Hold for diarrhea     feeding supplement (PRO-STAT SUGAR FREE 64) Liqd  Take  30 mLs by mouth 3 (three) times daily with meals. Ok to mixed it out with juice or any other drinks to facilitate intake     HYDROmorphone 4 MG tablet  Commonly known as:  DILAUDID  Take 1 tablet (4 mg total) by mouth every 4 (four) hours as needed for  moderate pain or severe pain.     LORazepam 2 MG/ML concentrated solution  Commonly known as:  ATIVAN  Take 0.5 mLs (1 mg total) by mouth every 6 (six) hours as needed for anxiety.     Melatonin 3 MG Tabs  Take 3 mg by mouth at bedtime.     nitroGLYCERIN 0.4 MG SL tablet  Commonly known as:  NITROSTAT  Place 1 tablet (0.4 mg total) under the tongue once.     pantoprazole 40 MG tablet  Commonly known as:  PROTONIX  Take 40 mg by mouth daily.     polyethylene glycol packet  Commonly known as:  MIRALAX / GLYCOLAX  Take 17 g by mouth daily as needed for moderate constipation.        Relevant Imaging Results:  Relevant Lab Results:  Recent Labs    Additional Information     Jenita SeashoreMaggie Cox BSW Intern, [phone]   Derenda FennelBashira Tria Noguera, MSW, LCSWA [phone] 03/10/2015 11:23 AM

## 2015-03-10 NOTE — Progress Notes (Signed)
Nutrition Brief Note  Chart reviewed. Pt now transitioning to comfort care, no plans for further dialysis. Patient to be d/c to residential hospice facility. No nutrition interventions warranted at this time.  Please consult as needed.    Joaquin CourtsKimberly Basya Casavant, RD, LDN, CNSC Pager [phone] After Hours Pager [phone]

## 2015-03-10 NOTE — Discharge Instructions (Signed)
Activity -  As tolerated with Full fall precautions use walker/cane & assistance as needed   Disposition residential hospice versus SNF with palliative care and comfort measures   Diet: As desired with feeding assistance and aspiration precautions.   Goal of care is now comfort/ hospice.

## 2015-03-10 NOTE — Clinical Social Work Note (Signed)
Clinical Social Work Assessment  Patient Details  Name: [name] JanFrank Rupert MRN: [id number] Date of Birth: [birth year]  Date of referral:  03/10/15               Reason for consult:  Facility Placement                Permission sought to share information with:  Facility Medical sales representativeContact Representative, Family Supports Permission granted to share information::  Yes, Verbal Permission Granted  Name::     Dory HornSherri Price  Agency::  [facility]  Relationship::  Sister  Contact Information:  [phone]  Housing/Transportation Living arrangements for the past 2 months:  Skilled Nursing Facility Source of Information:  Other (Comment Required) (Sister) Patient Interpreter Needed:  None Criminal Activity/Legal Involvement Pertinent to Current Situation/Hospitalization:  No - Comment as needed Significant Relationships:  Parents, Siblings Lives with:  Facility Resident Do you feel safe going back to the place where you live?  Yes Need for family participation in patient care:  Yes (Comment)  Care giving concerns:  Social worker was unable to assess the patient, but was able to speak with patient sister. Patient sister stated that she was not very happy with the [facility], but she was hopeful that care would improve.    Social Worker assessment / plan:  Social worker was unable to assess patient, but spoke with patient sister over the phone to complete assessment. Patient [facility] Patient sister also confirmed that the [facility] following. [facility]. Social worker to follow through with disposition needs.   Employment status:  Disabled (Comment on whether or not currently receiving Disability) Insurance information:  Medicare PT Recommendations:  Skilled Nursing Facility Information / Referral to community  resources:  Skilled Nursing Facility  Patient/Family's Response to care: Patient sister was frustrated with the [facility] that the [facility]. Patient sister was agreeable to [facility] and was hopeful that care would improve with [facility].  Patient/Family's Understanding of and Emotional Response to Diagnosis, Current Treatment, and Prognosis:  Patient sister issad about the situation with the patient, but she understands that the care plan is what the patient wants. Patient sister seems to understand the prognosis and discharge process.  Emotional Assessment Appearance:  Appears older than stated age Attitude/Demeanor/Rapport:  Unable to Assess Affect (typically observed):  Unable to Assess Orientation:  Oriented to Self, Oriented to Place, Oriented to Situation Alcohol / Substance use:  Not Applicable Psych involvement (Current and /or in the community):  No (Comment)  Discharge Needs  Concerns to be addressed:  No discharge needs identified Readmission within the last 30 days:  No Current discharge risk:  Physical Impairment Barriers to Discharge:  No Barriers Identified    Jenita SeashoreMaggie Haileigh Pitz BSW Intern, [phone]

## 2015-03-26 DEATH — deceased

## 2015-07-10 NOTE — Progress Notes (Signed)
This encounter was created in error - please disregard.

## 2016-02-17 IMAGING — CR DG CHEST 1V PORT
1 series · 1 of 1 positions shown · non-contrast
Comparison: 10/01/2014

CLINICAL DATA: Shortness of breath, chest pressure last night

EXAM:
PORTABLE CHEST - 1 VIEW

[AP]
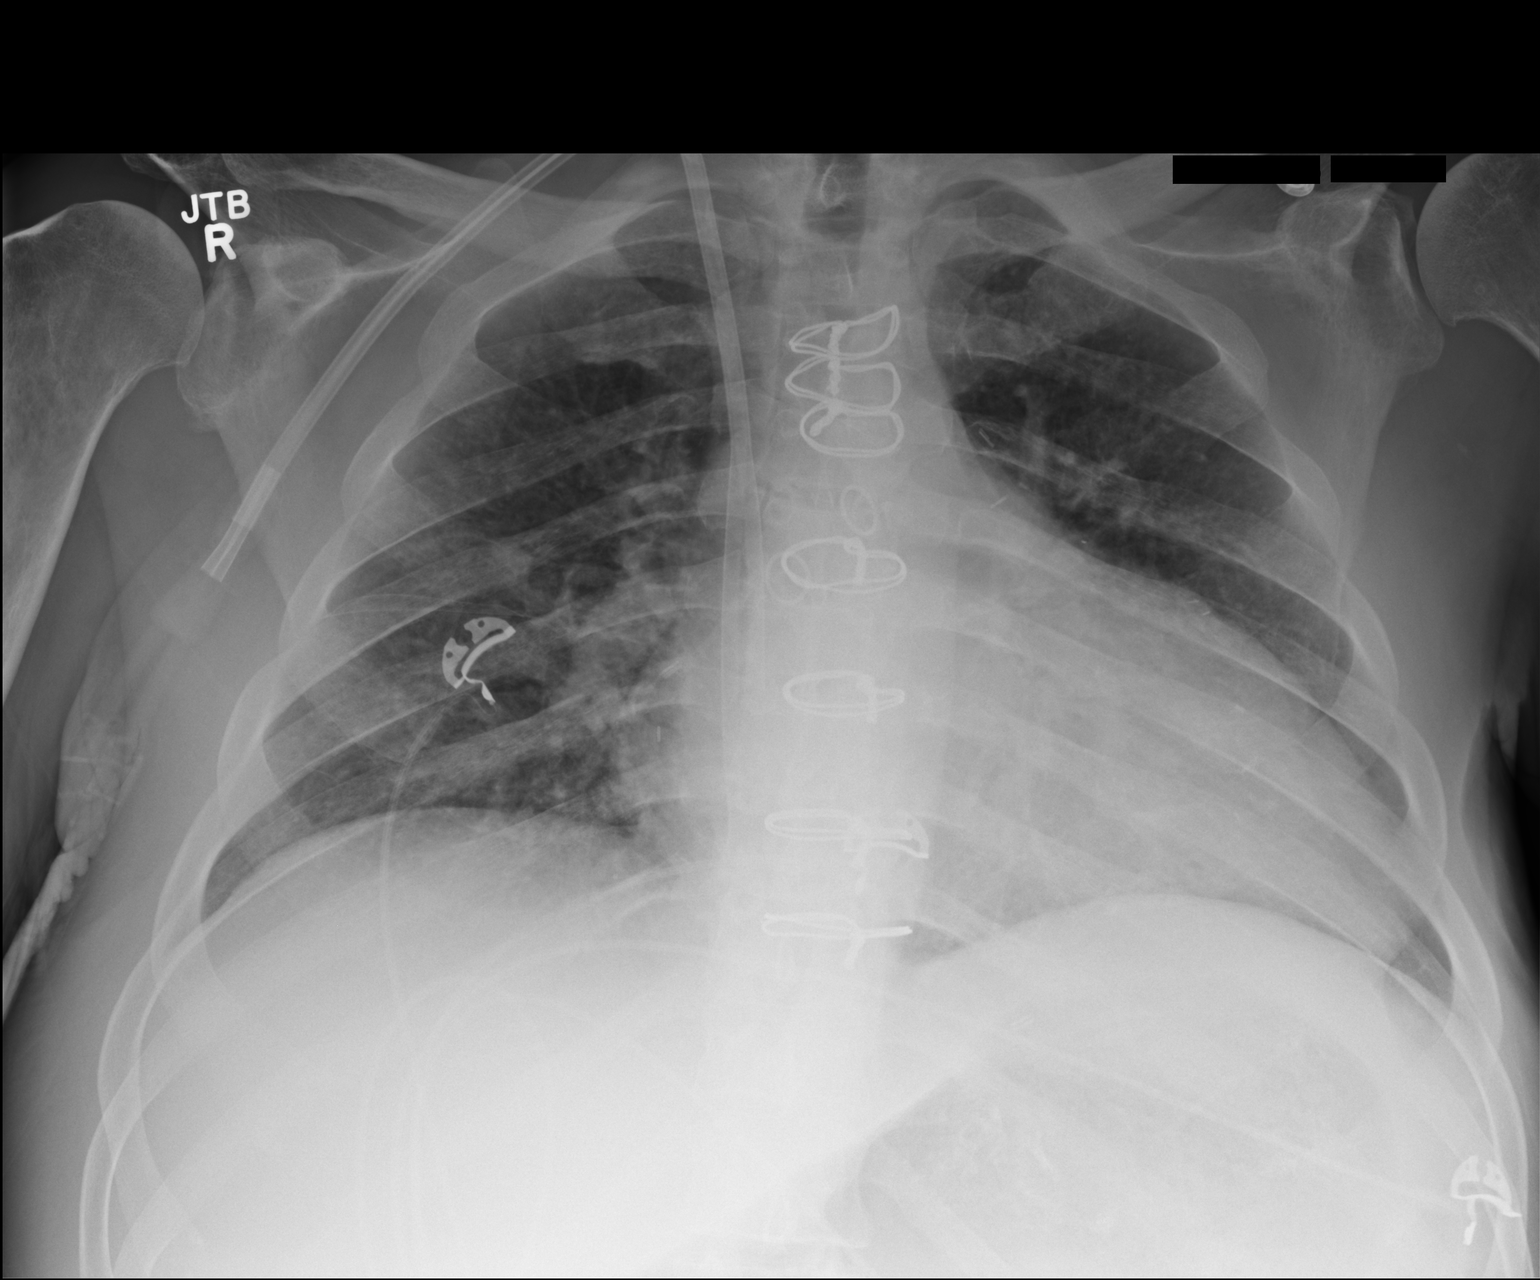

[1 of 1 positions shown; findings below may reference images not displayed]

FINDINGS: Cardiomegaly again noted. Status post CABG. Dual lumen right IJ
catheter is unchanged in position. No acute infiltrate or pulmonary
edema. There is no pneumothorax.
IMPRESSION: No active disease. Cardiomegaly again noted. Status post CABG.
Stable dual lumen right IJ catheter position. No pneumothorax.

## 2016-03-09 IMAGING — US US ABDOMEN LIMITED
1 series · 14 of 25 positions shown · non-contrast
Comparison: None

CLINICAL DATA: Jaundice

EXAM:
US ABDOMEN LIMITED - RIGHT UPPER QUADRANT

[Series 1: us abdomen limited · 0.27mm/px · 14 of 48 slices shown]
[im 1/48]
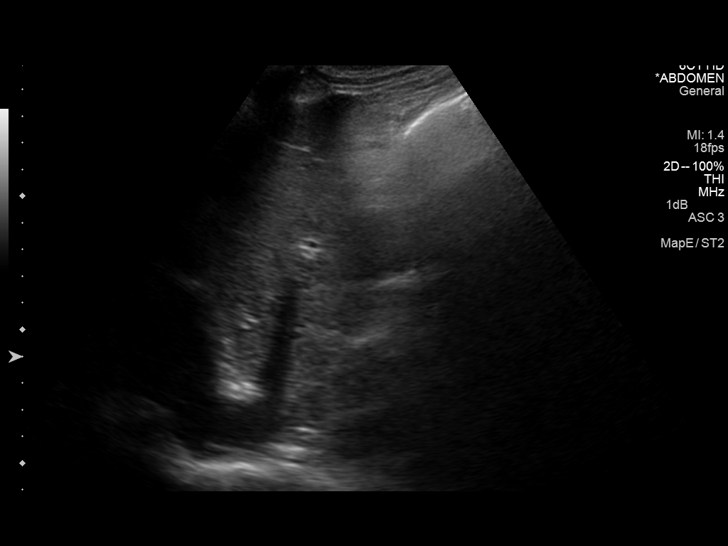
[im 4/48]
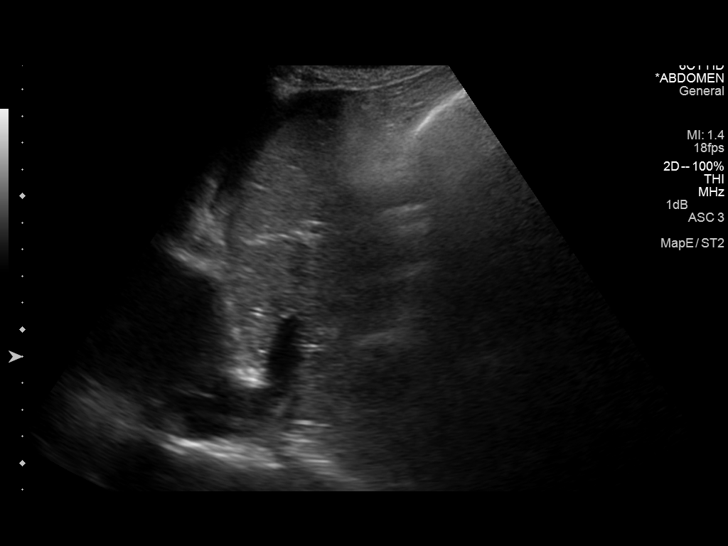
[im 8/48]
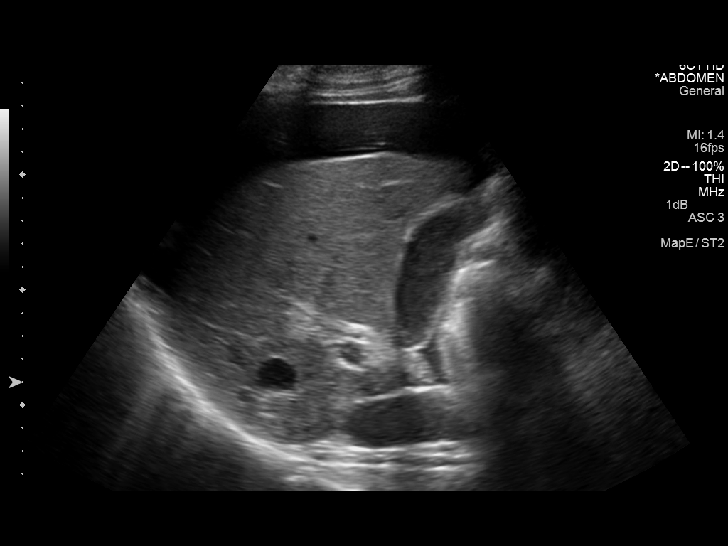
[im 12/48]
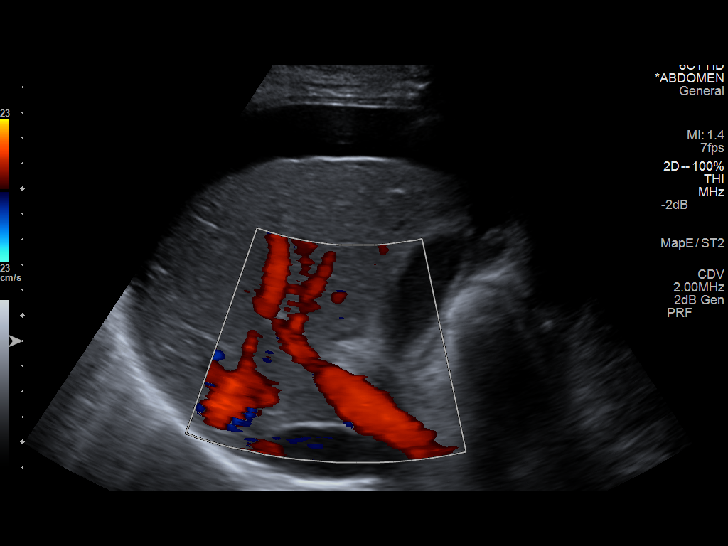
[im 16/48]
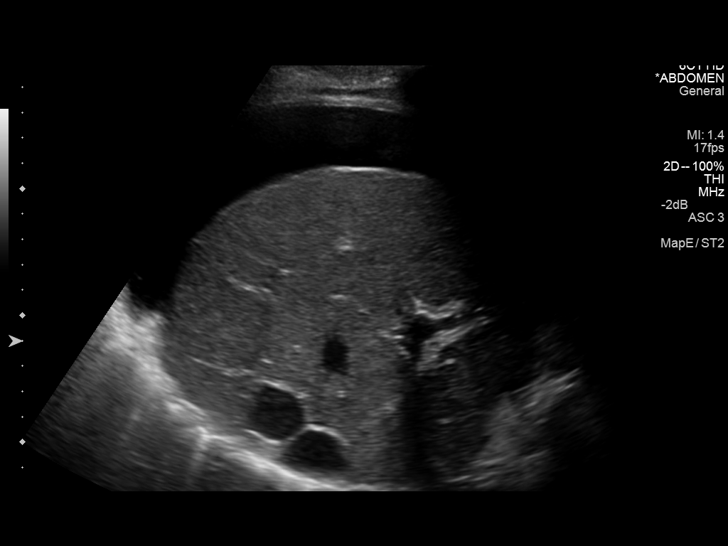
[im 18/48]
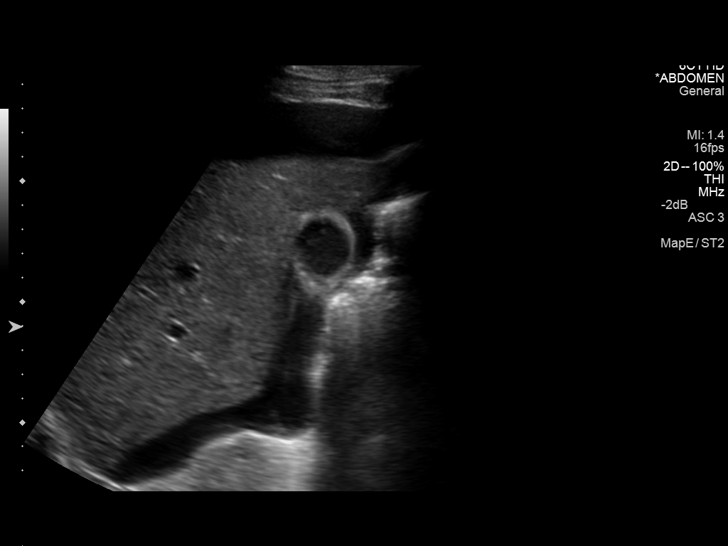
[im 22/48]
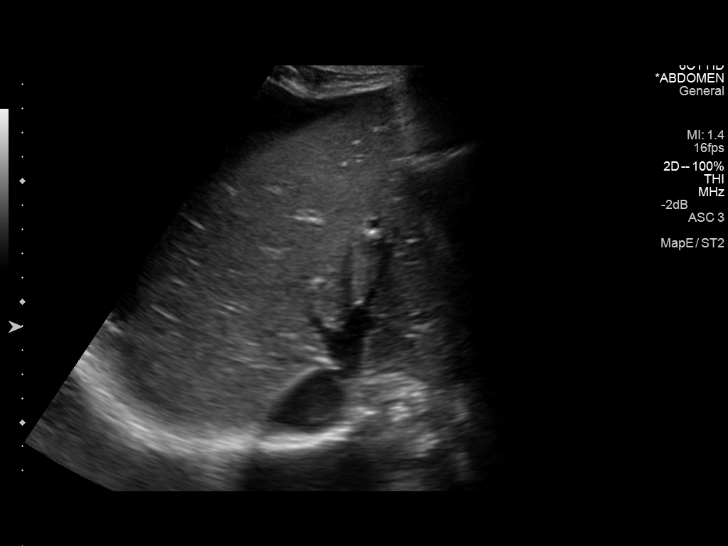
[im 26/48]
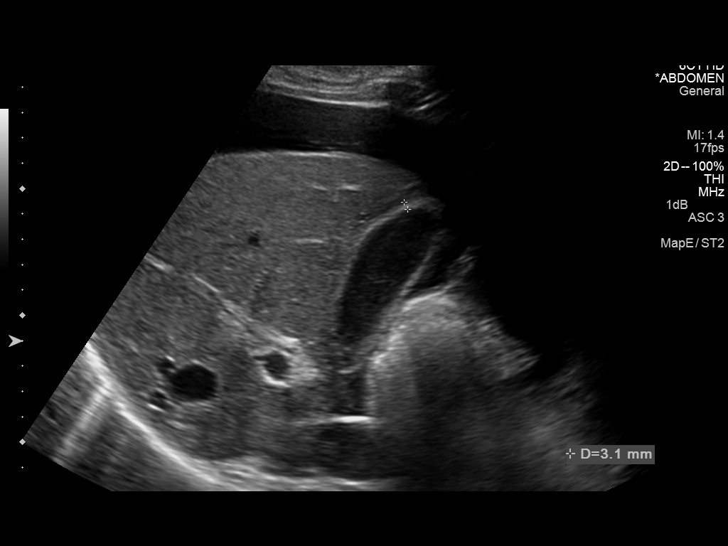
[im 30/48]
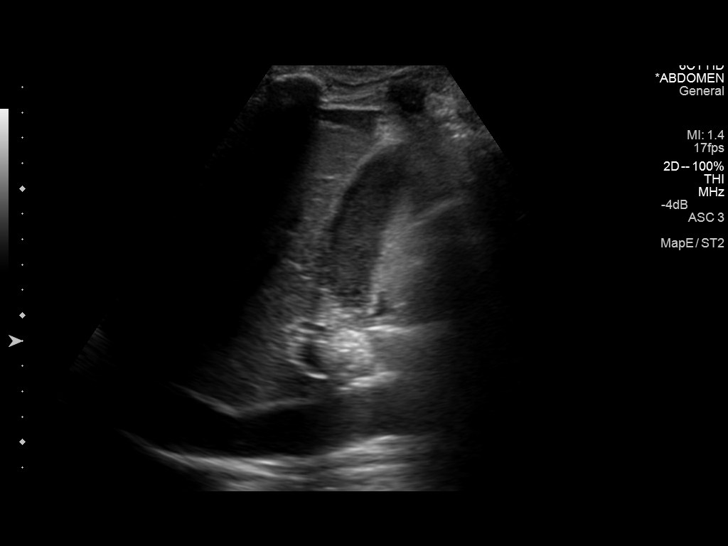
[im 32/48]
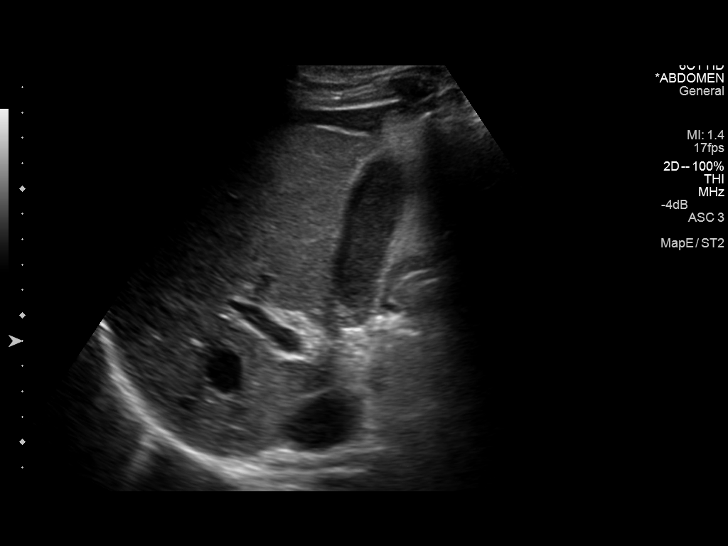
[im 36/48]
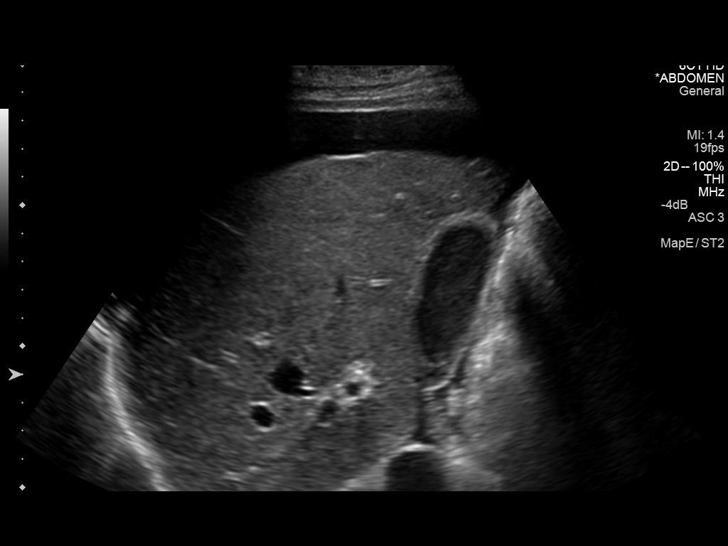
[im 40/48]
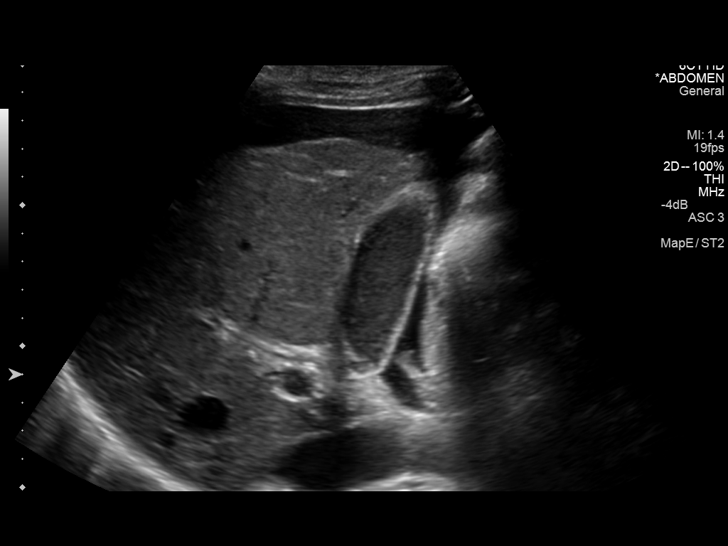
[im 44/48]
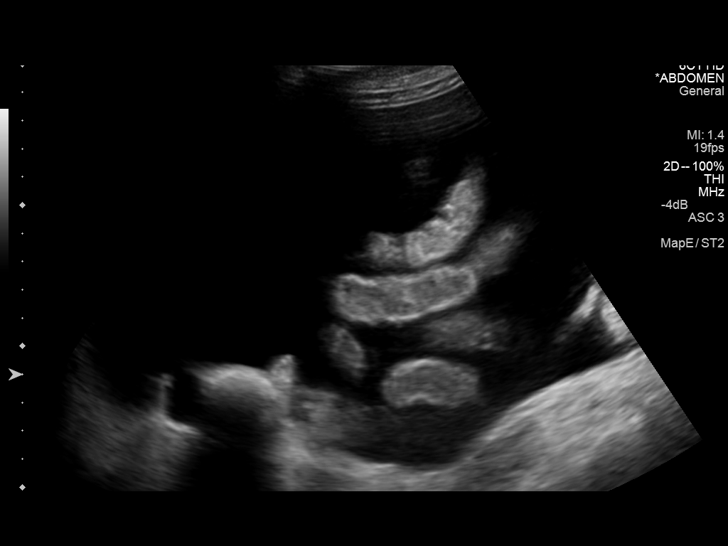
[im 48/48]
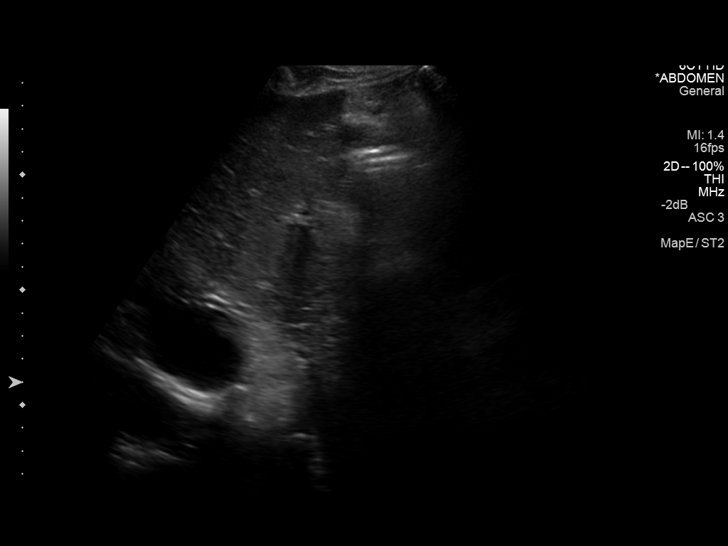

[14 of 25 positions shown; findings below may reference images not displayed]

FINDINGS: Gallbladder:

Well distended with echogenic material consistent with gallbladder
sludge. No gallstones are seen. Diffuse ascites is seen contributing
to mild wall thickening of 3.7 mm.

Common bile duct:

Diameter: 3.2 mm.

Liver:

No focal lesion identified. Within normal limits in parenchymal
echogenicity.
IMPRESSION: Diffuse ascites.

Gallbladder sludge.

## 2016-04-09 IMAGING — DX DG CHEST 2V
2 series · 2 of 2 positions shown · non-contrast
Comparison: 10/17/2014

CLINICAL DATA: Preop evaluation.  Sepsis with cellulitis both legs.

EXAM:
CHEST  2 VIEW

[x chest ap]
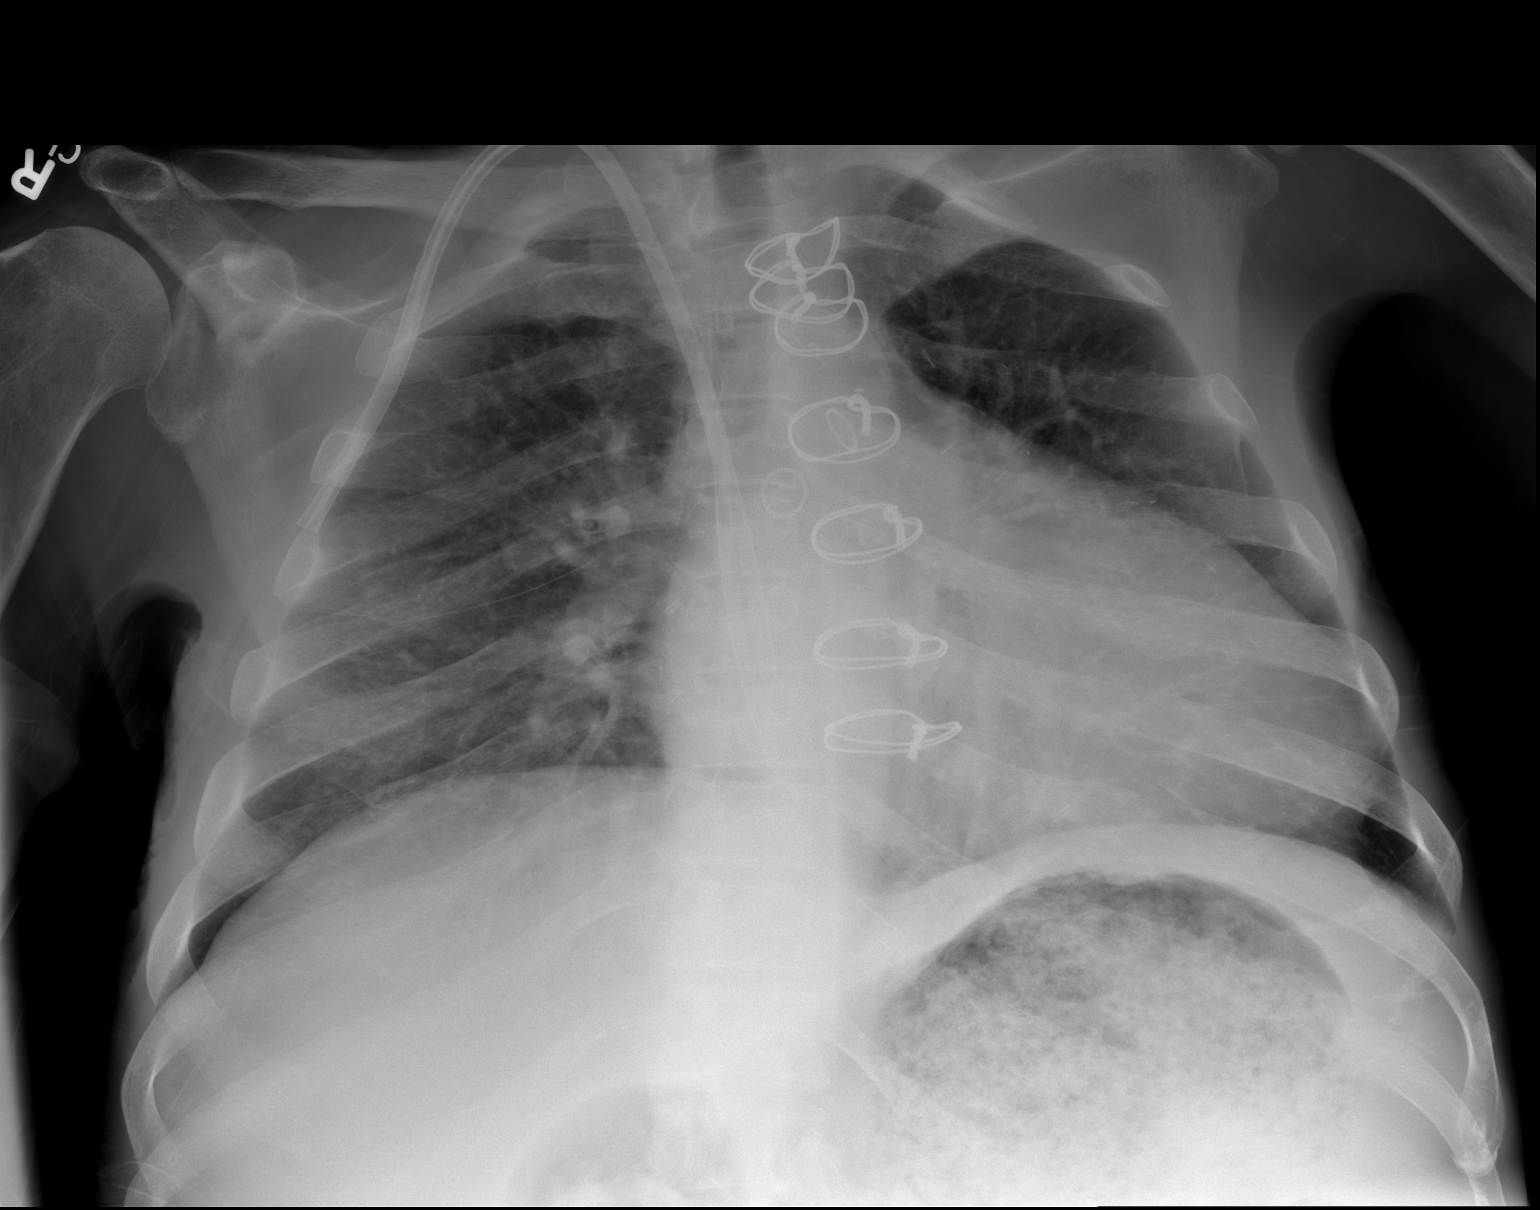

[w chest lat]
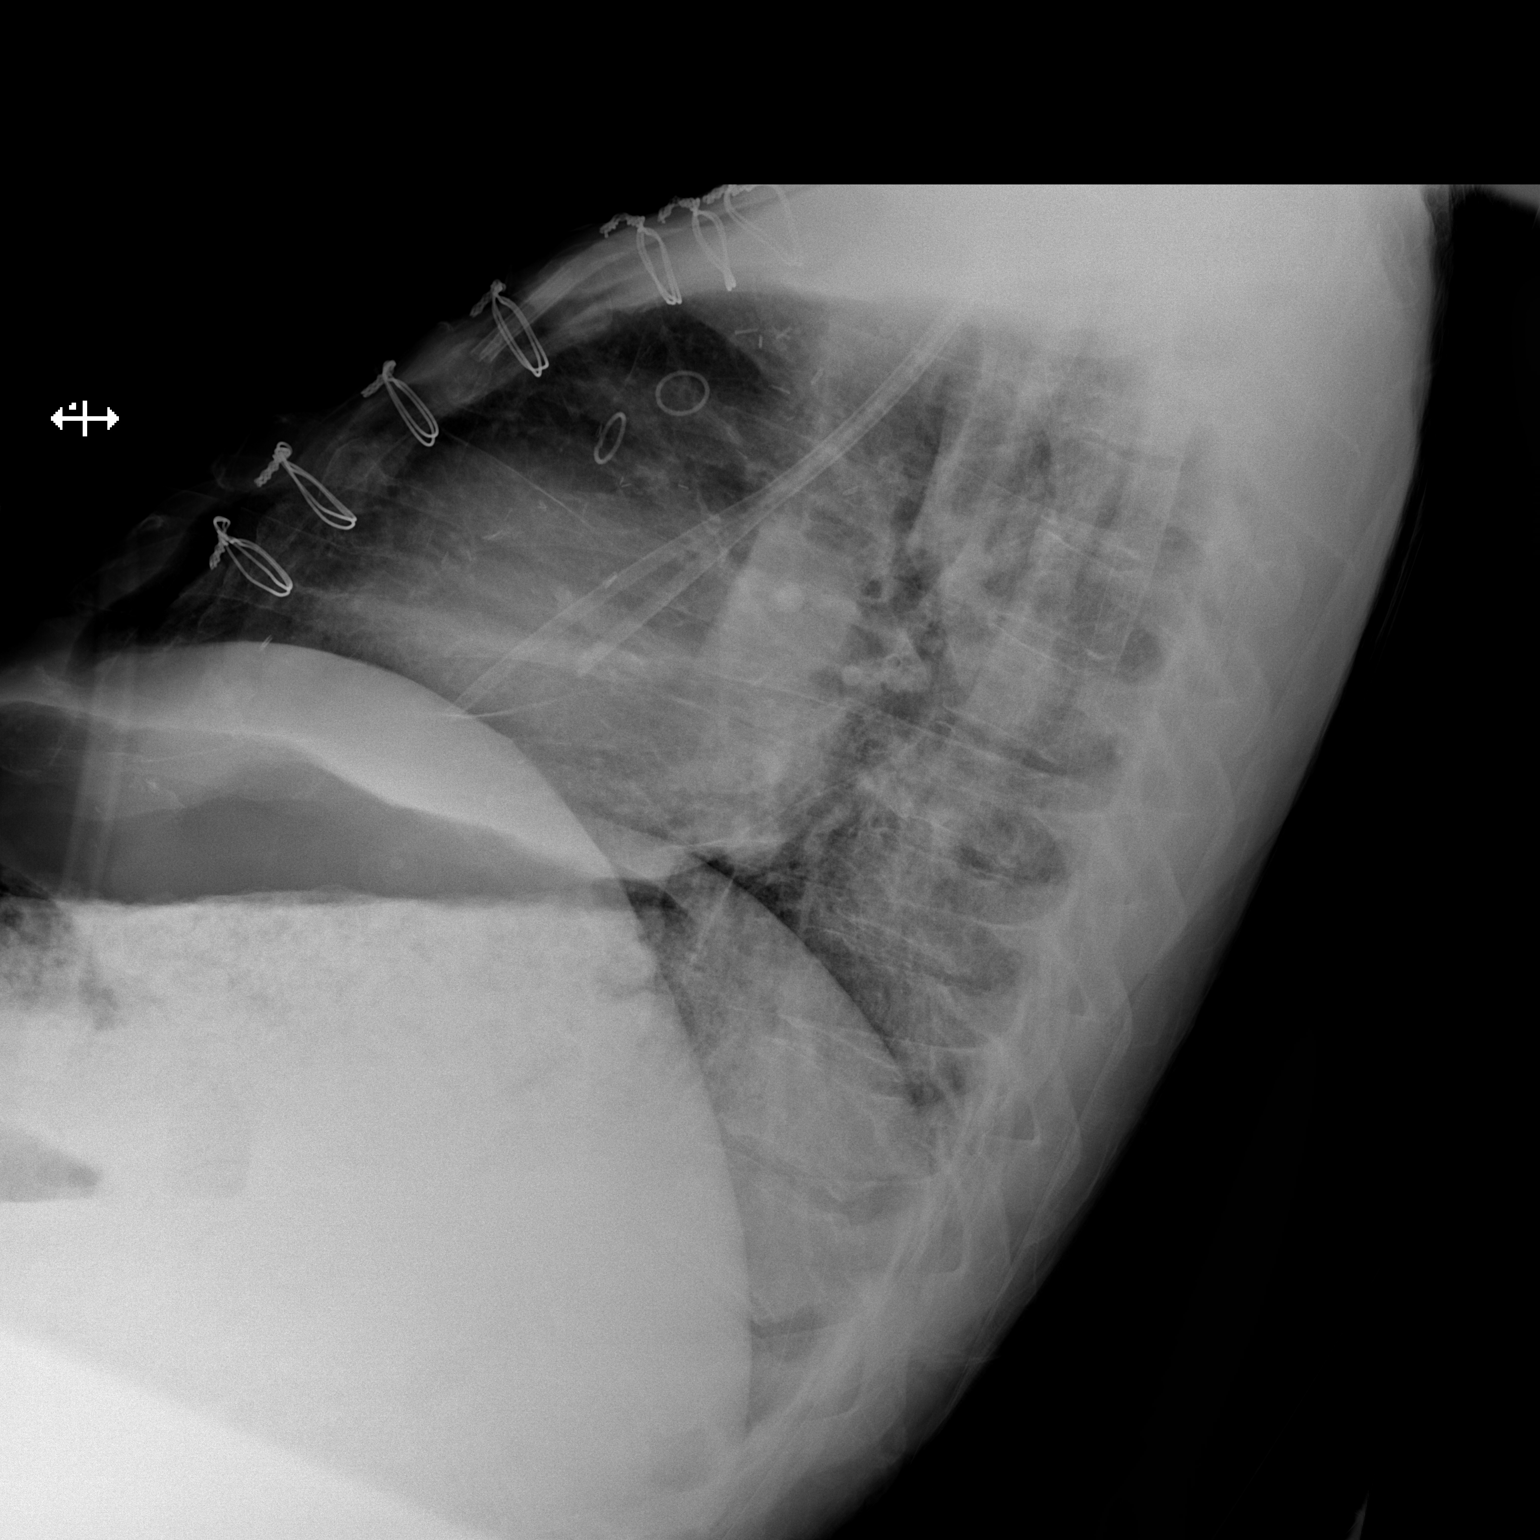

[2 of 2 positions shown; findings below may reference images not displayed]

FINDINGS: Sternotomy wires and right IJ central venous catheter unchanged.
Lungs are hypoinflated without focal consolidation or effusion. Mild
stable cardiomegaly. Remainder of the exam is unchanged.
IMPRESSION: Hypoinflation without acute cardiopulmonary disease.

## 2016-07-09 IMAGING — CR DG ABD PORTABLE 1V
1 series · 1 of 1 positions shown · non-contrast
Comparison: None.

CLINICAL DATA: Diffuse abdominal pain with distention and nausea.

EXAM:
PORTABLE ABDOMEN - 1 VIEW

[AP]
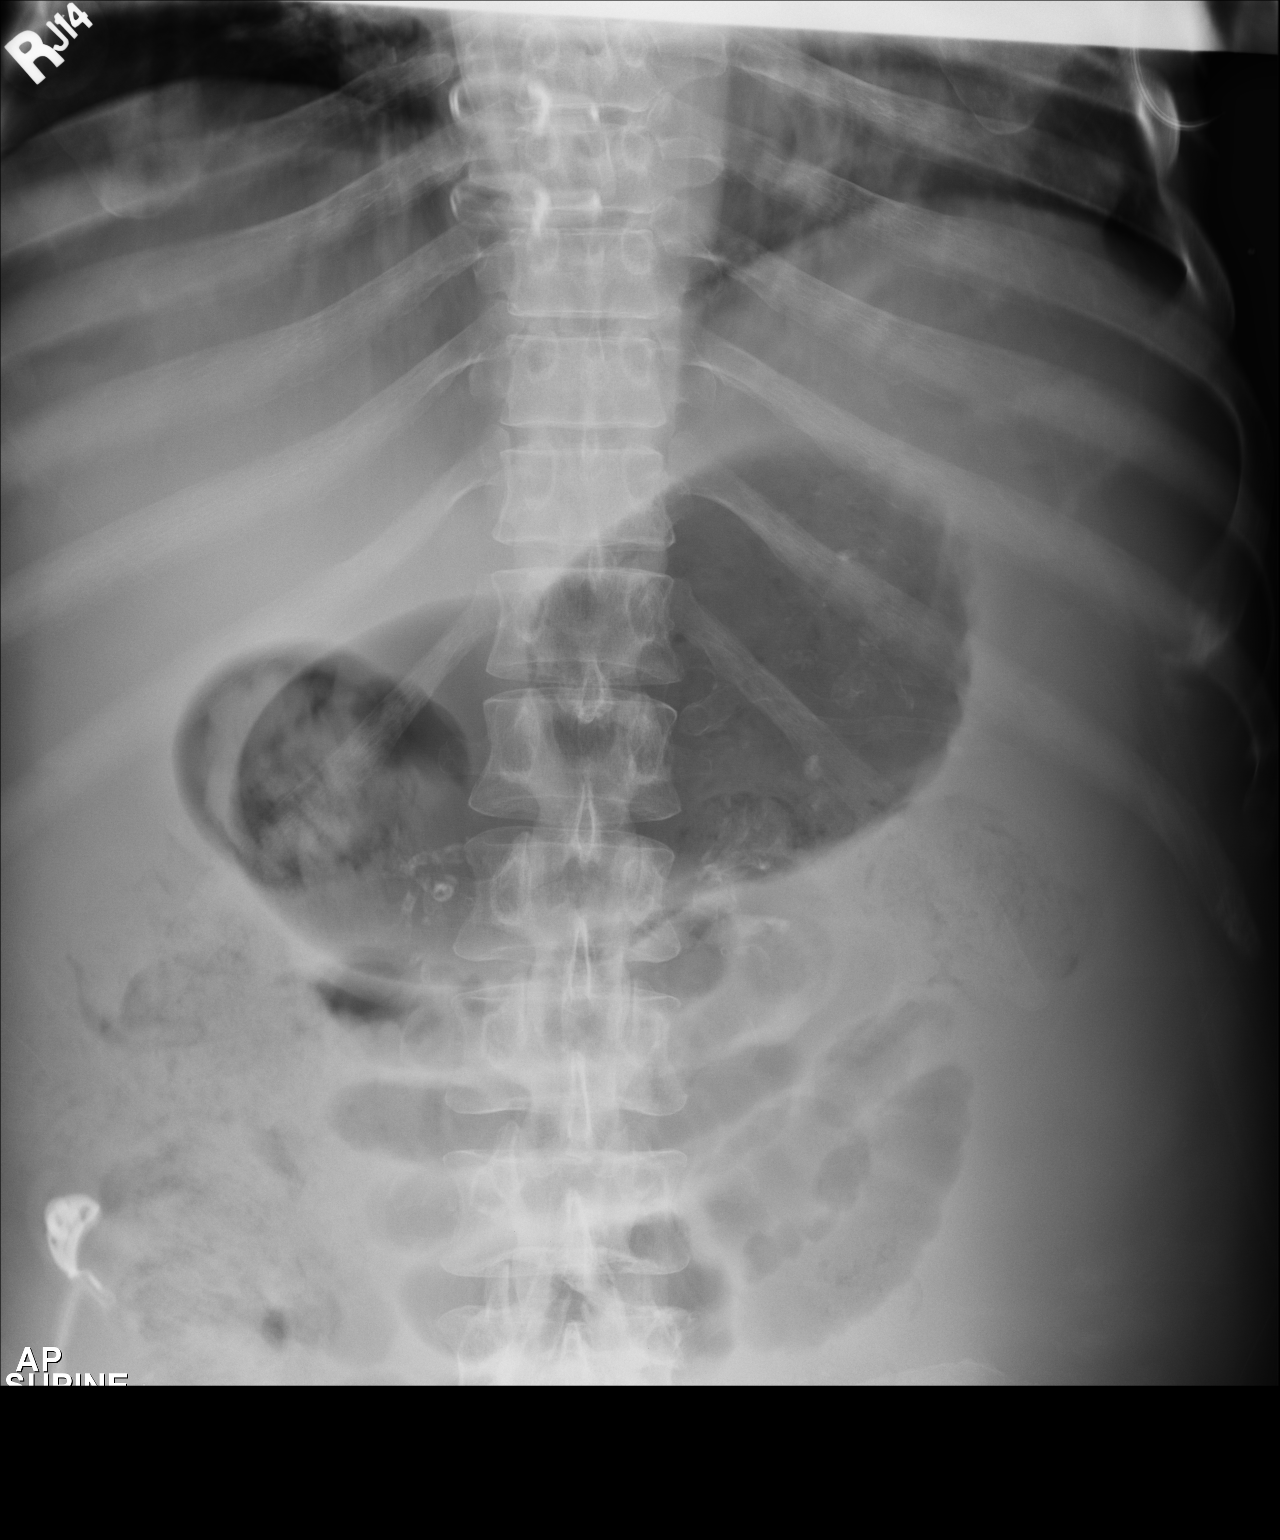

[1 of 1 positions shown; findings below may reference images not displayed]

FINDINGS: Single minimally motion degraded supine view of the abdomen and
pelvis. Mild to moderate gastric distension. Gas-filled small bowel
loops, maximally 3.3 cm. Gas and stool within the ascending colon.
Pelvis excluded. No free intraperitoneal air.
IMPRESSION: Gaseous and less so small bowel distension, mild. Stool within the
ascending colon. Findings may relate to a mild adynamic ileus.
Low-grade partial small bowel obstruction cannot be excluded.

Limitations, including motion and exclusion of the pelvis.
